# Patient Record
Sex: Male | Born: 1945 | Race: Black or African American | Hispanic: No | Smoking: Never smoker
Health system: Southern US, Community
[De-identification: ages and names within clinical notes are randomized; demographics above are authoritative.]

## PROBLEM LIST (undated history)

## (undated) DIAGNOSIS — I4891 Unspecified atrial fibrillation: Secondary | ICD-10-CM

## (undated) DIAGNOSIS — M109 Gout, unspecified: Secondary | ICD-10-CM

## (undated) DIAGNOSIS — C61 Malignant neoplasm of prostate: Secondary | ICD-10-CM

## (undated) DIAGNOSIS — Z9289 Personal history of other medical treatment: Secondary | ICD-10-CM

## (undated) DIAGNOSIS — I422 Other hypertrophic cardiomyopathy: Secondary | ICD-10-CM

## (undated) DIAGNOSIS — B1920 Unspecified viral hepatitis C without hepatic coma: Secondary | ICD-10-CM

## (undated) DIAGNOSIS — Z860100 Personal history of colon polyps, unspecified: Secondary | ICD-10-CM

## (undated) DIAGNOSIS — I10 Essential (primary) hypertension: Secondary | ICD-10-CM

## (undated) DIAGNOSIS — I4892 Unspecified atrial flutter: Secondary | ICD-10-CM

## (undated) DIAGNOSIS — K5790 Diverticulosis of intestine, part unspecified, without perforation or abscess without bleeding: Secondary | ICD-10-CM

## (undated) DIAGNOSIS — C189 Malignant neoplasm of colon, unspecified: Secondary | ICD-10-CM

## (undated) DIAGNOSIS — IMO0002 Reserved for concepts with insufficient information to code with codable children: Secondary | ICD-10-CM

## (undated) DIAGNOSIS — Z86718 Personal history of other venous thrombosis and embolism: Secondary | ICD-10-CM

## (undated) HISTORY — DX: Personal history of other venous thrombosis and embolism: Z86.718

## (undated) HISTORY — DX: Unspecified atrial flutter: I48.92

## (undated) HISTORY — DX: Personal history of colonic polyps: Z86.010

## (undated) HISTORY — DX: Reserved for concepts with insufficient information to code with codable children: IMO0002

## (undated) HISTORY — PX: PROSTATECTOMY: SHX69

## (undated) HISTORY — DX: Personal history of colon polyps, unspecified: Z86.0100

## (undated) HISTORY — DX: Diverticulosis of intestine, part unspecified, without perforation or abscess without bleeding: K57.90

## (undated) HISTORY — DX: Malignant neoplasm of colon, unspecified: C18.9

## (undated) HISTORY — DX: Personal history of other medical treatment: Z92.89

## (undated) HISTORY — DX: Gout, unspecified: M10.9

## (undated) HISTORY — DX: Unspecified atrial fibrillation: I48.91

## (undated) HISTORY — DX: Other hypertrophic cardiomyopathy: I42.2

## (undated) HISTORY — DX: Unspecified viral hepatitis C without hepatic coma: B19.20

## (undated) HISTORY — PX: COLONOSCOPY: SHX174

---

## 2001-04-21 ENCOUNTER — Ambulatory Visit (HOSPITAL_COMMUNITY): Admission: RE | Admit: 2001-04-21 | Discharge: 2001-04-21 | Payer: Self-pay | Admitting: *Deleted

## 2001-04-21 ENCOUNTER — Encounter (INDEPENDENT_AMBULATORY_CARE_PROVIDER_SITE_OTHER): Payer: Self-pay | Admitting: *Deleted

## 2002-04-26 DIAGNOSIS — Z9289 Personal history of other medical treatment: Secondary | ICD-10-CM

## 2002-04-26 HISTORY — DX: Personal history of other medical treatment: Z92.89

## 2002-04-30 ENCOUNTER — Ambulatory Visit (HOSPITAL_COMMUNITY): Admission: RE | Admit: 2002-04-30 | Discharge: 2002-04-30 | Payer: Self-pay | Admitting: Cardiology

## 2002-04-30 ENCOUNTER — Encounter: Payer: Self-pay | Admitting: Cardiology

## 2003-03-13 ENCOUNTER — Ambulatory Visit: Admission: RE | Admit: 2003-03-13 | Discharge: 2003-04-12 | Payer: Self-pay | Admitting: Radiation Oncology

## 2003-03-27 ENCOUNTER — Encounter: Payer: Self-pay | Admitting: Urology

## 2003-03-28 HISTORY — PX: PROSTATECTOMY: SHX69

## 2003-04-01 ENCOUNTER — Encounter (INDEPENDENT_AMBULATORY_CARE_PROVIDER_SITE_OTHER): Payer: Self-pay | Admitting: Specialist

## 2003-04-01 ENCOUNTER — Inpatient Hospital Stay (HOSPITAL_COMMUNITY): Admission: RE | Admit: 2003-04-01 | Discharge: 2003-04-04 | Payer: Self-pay | Admitting: Urology

## 2003-04-08 ENCOUNTER — Ambulatory Visit (HOSPITAL_COMMUNITY): Admission: RE | Admit: 2003-04-08 | Discharge: 2003-04-08 | Payer: Self-pay | Admitting: Urology

## 2003-04-08 ENCOUNTER — Emergency Department (HOSPITAL_COMMUNITY): Admission: EM | Admit: 2003-04-08 | Discharge: 2003-04-08 | Payer: Self-pay

## 2003-04-09 ENCOUNTER — Inpatient Hospital Stay (HOSPITAL_COMMUNITY): Admission: EM | Admit: 2003-04-09 | Discharge: 2003-04-17 | Payer: Self-pay | Admitting: Internal Medicine

## 2003-04-09 ENCOUNTER — Encounter: Payer: Self-pay | Admitting: Internal Medicine

## 2003-04-09 ENCOUNTER — Encounter (INDEPENDENT_AMBULATORY_CARE_PROVIDER_SITE_OTHER): Payer: Self-pay | Admitting: Cardiology

## 2003-04-10 ENCOUNTER — Encounter: Payer: Self-pay | Admitting: Internal Medicine

## 2005-12-27 HISTORY — PX: CARDIAC CATHETERIZATION: SHX172

## 2006-05-27 HISTORY — PX: TRANSTHORACIC ECHOCARDIOGRAM: SHX275

## 2006-05-30 ENCOUNTER — Ambulatory Visit: Payer: Self-pay | Admitting: Family Medicine

## 2006-06-02 ENCOUNTER — Ambulatory Visit: Payer: Self-pay | Admitting: Family Medicine

## 2006-06-11 ENCOUNTER — Encounter: Payer: Self-pay | Admitting: Emergency Medicine

## 2006-06-11 ENCOUNTER — Inpatient Hospital Stay (HOSPITAL_COMMUNITY): Admission: AD | Admit: 2006-06-11 | Discharge: 2006-06-17 | Payer: Self-pay | Admitting: Cardiovascular Disease

## 2006-06-13 ENCOUNTER — Encounter (INDEPENDENT_AMBULATORY_CARE_PROVIDER_SITE_OTHER): Payer: Self-pay | Admitting: Cardiology

## 2006-06-30 ENCOUNTER — Ambulatory Visit: Payer: Self-pay | Admitting: Family Medicine

## 2006-07-18 ENCOUNTER — Ambulatory Visit: Payer: Self-pay | Admitting: Gastroenterology

## 2006-08-09 ENCOUNTER — Ambulatory Visit: Payer: Self-pay | Admitting: Gastroenterology

## 2006-08-09 ENCOUNTER — Encounter (INDEPENDENT_AMBULATORY_CARE_PROVIDER_SITE_OTHER): Payer: Self-pay | Admitting: Specialist

## 2006-11-29 ENCOUNTER — Ambulatory Visit: Payer: Self-pay | Admitting: Gastroenterology

## 2007-10-29 ENCOUNTER — Emergency Department (HOSPITAL_COMMUNITY): Admission: EM | Admit: 2007-10-29 | Discharge: 2007-10-29 | Payer: Self-pay | Admitting: Emergency Medicine

## 2011-01-06 ENCOUNTER — Ambulatory Visit: Admit: 2011-01-06 | Payer: Self-pay | Admitting: Family Medicine

## 2011-05-14 NOTE — Op Note (Signed)
NAME:  Jordan [name], Jordan [name] NO.:  [id number]   MEDICAL RECORD NO.:  [id number]                   PATIENT TYPE:  INP   LOCATION:  0364                                 FACILITY:  [facility]   PHYSICIAN:  [name], M.D.               DATE OF BIRTH:  11/29/1946   DATE OF PROCEDURE:  04/01/2003  DATE OF DISCHARGE:                                 OPERATIVE REPORT   PREOPERATIVE DIAGNOSIS:  Clinical stage T1C adenocarcinoma of the prostate.   POSTOPERATIVE DIAGNOSIS:  Clinical stage T1C adenocarcinoma of the prostate.   PROCEDURE PERFORMED:  1. Pelvic lymph node dissection.  2. Radical retropubic prostatectomy.   SURGEON:  [name], M.D.   ASSISTANTS:  1. Lindaann Slough, M.D.  2. Melvyn Novas, M.D.   ANESTHESIA:  General endotracheal.   INDICATIONS:  Mr. Jordan [name] is a 65 year old male.  He was recently noted to  an elevated PSA of approximately 6.8.  A year prior to that, his PSA had  been 4.8, but he was not sent for urologic evaluation at that time.  He was  asymptomatic with a normal digital rectal exam.  Biopsies revealed bilateral  adenocarcinoma of the prostate with a Gleason score 3 + 3 = 6 on both sides.  Approximately 40% of the biopsy material was positive on the right side and  5% on the left.  The patient underwent extensive counseling by myself as  well as Maryln Gottron, M.D. of radiation oncology with regard to his  prescription options.  He elected to be treated with radical retropubic  prostatectomy.  The patient appeared to understand the advantages of surgery  as well as the potential complications and risks.  Complications included  but were not limited to operative death; or perioperative complications  including bleeding, infection, pulmonary embolus, etc.  We also spent  extensive time discussing the issues with regard to incontinence and  impotence after radical retropubic prostatectomy.  The patient appeared to  understand the advantages and disadvantages to the approach, and full  informed consent was obtained.   TECHNIQUE AND FINDINGS:  The patient was brought to the operating room where  he had successful induction of endotracheal anesthesia.  He was placed in  the supine position and prepped and draped in the usual manner.  A Foley  catheter was placed sterilely.  A standard lower midline incision was  performed.  An Omni retractor was utilized for retraction.  Standard  bilateral pelvic lymph node dissections were performed.  There was no  evidence of gross adenopathy.  A standard obturator packets were sent  separately.  Obturator nerves were identified bilaterally.  The endopelvic  fascia was then incised bilaterally.  Both apices of the prostate were  palpated, and no obvious induration was noted.  There did not appear to be  any palpable induration of the prostate.  A back-bleeder stitch was placed  across  the dorsal vein superficially at the mid prostatic level.  Puboprostatic ligaments were identified and sharply take down.  One could  easily palpate a groove between the dorsal vein complex and underlying  urethra.  A right-angle clamp was passed beyond that, and two sutures were  placed with #1 Vicryl to tie off the dorsal vein complex.  The dorsal vein  was then transected.  The underlying urethra was identified.  A right-angle  clamp was gently placed underneath the urethra which was then transected.  The Foley catheter was pulled out the incision and removed from the penis.  The posterior aspect of the urethra was then transected.  Sharp dissection  technique and a right-angle were utilized to transect the rectourethralis  musculature, and the posterior plane was then easily established.  Neurovascular bundles were identified, and we felt that this was an  appropriate case for bilateral nerve-sparing prostatectomy.  Endopelvic  fascia was sharply dissected, freeing up the bundles in  the posterolateral  location.  Posteriorly, one could identify the seminal vesicles underneath  Denonvilliers fascia, and the lateral pedicles were taken and tied with silk  suture or clipped with right-angle Hem-o-lok clips.  Once the pedicles were  completely taken down, attention was turned to the bladder neck.  Utilizing  a combination of sharp and blunt dissective technique, a bladder-neck-  sparing procedure was performed, preserving all of the circular fibers of  the bladder neck.  Posteriorly, the planes were a little bit indurated.  We  were able to identify the vas bilaterally which were clipped and separated  from the seminal vesicals.  The seminal vesicals were then taken with clips  at the tips.  A small amount of addition pedicle tissue was then clipped,  and the prostatic specimen was removed.  The bladder neck did not require  any reconstruction.  The mucosa was everted with 4-0 Vicryl suture.  Utilizing the urethral sound, the stump was identified, and five interrupted  sutures of 2-0 Vicryl were placed in corresponding positions with two  posterior, two lateral, and one at the 12 o'clock position.  The sutures  were then placed at the corresponding positions in the bladder neck.  The  anastomosis was then tied over a 22 French catheter.  The bladder was  irrigated, and there appeared to be an excellent anastomosis with no obvious  leak.  The whole pelvis was copiously irrigated.  A Jackson-Pratt drain was  placed in the retropubic space.  Marcaine was infiltrated in the wound.  The  fascia was closed with #1 PDS.  The skin was closed with clips.  Sponge and  needle counts were all correct.  Estimated blood loss was approximately 100  mL.  The patient remained stable throughout the procedure, and no obvious  complications occurred.  He was brought to the recovery room in stable  condition.                                               [name], M.D.    DSG/MEDQ   D:  04/01/2003  T:  04/01/2003  Job:  [id number]

## 2011-05-14 NOTE — H&P (Signed)
NAME:  Jordan [name], Jordan [name] NO.:  [id number]   MEDICAL RECORD NO.:  [id number]                   PATIENT TYPE:  INP   LOCATION:  NA                                   FACILITY:  [facility]   PHYSICIAN:  [name], M.D.               DATE OF BIRTH:  Mar 26, 1946   DATE OF ADMISSION:  04/01/2003  DATE OF DISCHARGE:                                HISTORY & PHYSICAL   CHIEF COMPLAINT:  Clinical stage T1c adenocarcinoma of the prostate.   HISTORY OF PRESENT ILLNESS:  The patient is a very healthy 65 year old male.  He was recently noted to have an elevated PSA of 6.8.  Digital rectal exam  was unremarkable and showed a 1+ prostate without any abnormalities.  An  ultrasound showed a 30 g prostate.  Biopsies were done which showed 40% of  the material positive for a Gleason's 3 + 3 = 6 adenocarcinoma of the  prostate.  On the left he had 5% of the biopsy material positive for a  Gleason 6 tumor.  The patient underwent extensive office consultation and  also consultation with Dr. Ballard Russell.  We discussed at length the  advantages and disadvantages of a surgical approach versus radiation.  The  patient has elected to have a pelvic lymph node dissection radical  retropubic prostatectomy.  He is completely asymptomatic clinically and has  no significant voiding symptoms.   PAST MEDICAL HISTORY:  His past medical history is totally unremarkable.  The patient enjoys good health.  He has been training for a marathon.  He  takes no regular medications and has no allergies.  He is a nonsmoker.   PHYSICAL EXAMINATION:  GENERAL:  He is a well-developed, well-nourished  male.  VITAL SIGNS:  He is afebrile with a blood pressure of 132/80 and a pulse of  64.  His current weight is approximately 228 pounds.  NECK:  His neck shows no JVD or masses.  CHEST:  Clear to auscultation.  HEART:  Regular rate and rhythm.  ABDOMEN:  Soft and nontender.  EXTERNAL GENITALIA:  Normal  penis, scrotum, testes, adnexal structures with  a 1+ prostate.   DATA:  CBC and basic electrolytes are within normal limits.  Preop tests are  otherwise unremarkable.   ASSESSMENT:  Clinical stage T1c adenocarcinoma of the prostate.   PLAN:  The patient is to undergo pelvic lymph node dissection radical  retropubic prostatectomy this morning and will hopefully be admitted for  routine postoperative care status post that procedure.                                               [name], M.D.    DSG/MEDQ  D:  04/01/2003  T:  04/01/2003  Job:  [id number]

## 2011-05-14 NOTE — Discharge Summary (Signed)
   NAME:  Jordan [name], SOMERA NO.:  [id number]   MEDICAL RECORD NO.:  [id number]                   PATIENT TYPE:  INP   LOCATION:  0364                                 FACILITY:  [facility]   PHYSICIAN:  [name], M.D.               DATE OF BIRTH:  [birth year]   DATE OF ADMISSION:  04/01/2003  DATE OF DISCHARGE:  04/04/2003                                 DISCHARGE SUMMARY   DISCHARGE DIAGNOSIS:  Clinical stage T1C and final pathologic stage PT2  adenocarcinoma of the prostate.   PROCEDURE PERFORMED:  On 04/01/03 pelvic lymph node dissection and radial  retropubic prostatectomy.   HOSPITAL COURSE:  Mr. [name] is a 65 year old healthy male.  He was noted  to have a PSA of approximately 6.8.  He had a biopsy which revealed  bilateral adenocarcinoma of the prostate with a Gleason's score of 3 + 3 = 6  bilaterally.  The patient underwent extensive counseling with regard to  treatment options, and elected to have pelvic lymph node dissection of  adequate retropubic prostatectomy.  He was admitted for postoperative care  after that procedure.  His surgery was essentially unremarkable and  uncomplicated.  The patient's estimated blood loss was approximately 800 cc.  His postoperative course was really uneventful.  His postoperative  hemoglobin was 13.1.  Renal function was normal.  He was up and ambulating  on postoperative day #1.  Pain control was always excellent.  He began to  resume a diet which was well tolerated.  His Jackson-Pratt drainage was  relatively minimal.  He remained completely stable.  By postoperative day #3  he was without complaints and afebrile with normal vital signs.  He had had  a bowel movement and was tolerating a general diet well.  His pathology at  the time of discharge was still pending.  His Jackson-Pratt drain was  removed.  Pathology did eventually reveal a pathologic stage T2 tumor.  There was a Gleason's score of 3 + 4 = 7.   The margins were negative.   DISPOSITION:  The patient was discharged home with a Foley catheter to  drainage.  He was given pain medication.  He will be seen in approximately  one week in our office.                                               [name], M.D.    DSG/MEDQ  D:  04/24/2003  T:  04/24/2003  Job:  [id number]

## 2011-05-14 NOTE — Cardiovascular Report (Signed)
NAME:  SENG, LARCH NO.:  [id number]   MEDICAL RECORD NO.:  [id number]            PATIENT TYPE:   LOCATION:                                 FACILITY:   PHYSICIAN:  Nanetta Batty, M.D.        DATE OF BIRTH:   DATE OF PROCEDURE:  06/13/2006  DATE OF DISCHARGE:                              CARDIAC CATHETERIZATION   HISTORY:  Jordan [name] is a 65 year old African-American male admitted by  Dr. Tresa Endo on 06/16 because of lightheadedness and weakness with presyncope.  He has a history of hypertension, DVT, and afebrile, on Coumadin  anticoagulation.  Rule out for myocardial infarction.  He had no significant  arrhythmias noted on telemetry.  He did have T wave inversion on his EKG;  and presents now for diagnostic coronary angiography to rule out ischemic  etiology.   DESCRIPTION OF PROCEDURE:  The patient was brought to the second floor [facility] in the postabsorptive state.  He was premedicated with  p.o. Valium and IV Versed.  His right groin was prepped and shave in the  usual sterile fashion.  Xylocaine 1# was used for local anesthesia. A 6-  French sheath was inserted into the right femoral artery using standard  Seldinger technique.  A 6-French, right and left Judkins diagnostic catheter  as well as a 6-French pigtail catheter were used for selective coronary  angiography, and left ventriculography respectively.  Isovue dye was used  for the entirety of the case.  Aortic, left ventricular, and pullback  pressures were recorded   HEMODYNAMIC RESULTS:  1.  Aortic systolic pressure 123, diastolic pressure 76.  2.  Left ventricular systolic pressure 124, end-diastolic pressure was 13.   SELECTIVE CHOLANGIOGRAPHY:  1.  Left main normal.  2.  LAD normal.  3.  Left circumflex normal.  4.  Right coronary is dominant and normal.   IMPRESSION:  Jordan [name] has normal coronary arteries, normal LV function.  I believe his T wave inversion is  related to LVH with strain probably from  poorly controlled hypertension.  The plan will be to re-heparinize him and  ultimately re-Coumadinize him.  He will be discharged home once his INR is  greater than or equal to 2.0.  The sheath was removed and pressure was held  on the groin to achieve hemostasis.  The patient left the lab in stable  condition.      Nanetta Batty, M.D.  Electronically Signed     JB/MEDQ  D:  06/13/2006  T:  06/14/2006  Job:  [id number]   cc:   [name], M.D.  Fax: [phone]   [name], M.D.  Fax: [phone]

## 2011-05-14 NOTE — Discharge Summary (Signed)
NAME:  Jordan [name], Jordan [name] NO.:  [id number]   MEDICAL RECORD NO.:  [id number]                   PATIENT TYPE:  INP   LOCATION:  0374                                 FACILITY:  [facility]   PHYSICIAN:  Rosanne Sack, M.D.         DATE OF BIRTH:  1946-05-23   DATE OF ADMISSION:  04/09/2003  DATE OF DISCHARGE:  04/17/2003                                 DISCHARGE SUMMARY   DISCHARGE DIAGNOSES:  1. 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     a. Discharge INR 2.7.  2. Paroxysmal atrial fibrillation with transient rapid ventricular response     secondary to problem number one.     a. In normal sinus rhythm.  3. Recent total radical prostatectomy by Jordan [name], M.D. with pelvic     lymph node dissection on April 01, 2003.     a. P1c adenocarcinoma.     b. Urinary incontinence after voiding trials.  4. Hypertension.  5. Normocytic anemia.     a. Hemoglobin 12.8.  6. Questionable sleep apnea.     a. Sleep study to be arranged by Jordan [name], M.D. upon discharge.   DISCHARGE MEDICATIONS:  1. Coumadin 7.5 mg p.o. daily.  2. Levaquin 250 mg p.o. daily x3 days.  3. Cardizem CD 180 mg p.o. daily.   DISCHARGE FOLLOWUP:  1. The patient will follow up with his primary care Jordan [name], Jordan [name],     M.D., Friday for a pro time check.  Jordan [name], M.D. will be in charge     of managing the patient's anticoagulation therapy.  Notice that the     Coumadin dose is 7.5 mg daily currently.  2. The patient is on Levaquin by Jordan [name], M.D. after the Foley     catheter was removed.  We would suspect a need for readjustment on the     Coumadin dose within three to five days when the Levaquin will be     completely finished.  The goal INR is between 2-3.  The length of therapy     should be about 12 months.  3. [name], M.D. will follow the patient within four weeks or so     from the cardiac standpoint.  The  patient had a history of P-wave     inversions in the lateral leads.  The patient had a Cardiolite in May     2003 with negative results.  We suspect that the paroxysmal atrial     fibrillation noticed on admission was likely to be associated with     bilateral pulmonary emboli.  The patient converted to sinus rhythm on day     one and remained in normal sinus rhythm for the rest of this     hospitalization.  It is for this reason that perhaps long-term     anticoagulation therapy may not be needed.  [name], M.D. will  decide upon discharge if the Cardizem will be continued.  Perhaps this     agent may need to continue given the patient's hypertension.  After 12     months of anticoagulation therapy for the patient's bilateral PE, aspirin     may be indicated given the transient cardiac arrhythmia.  4. Jordan [name], M.D. will schedule a sleep study upon discharge to rule out     sleep apnea.  5. Jordan [name], M.D. will follow the patient within two weeks from the     urological standpoint.   CONSULTATIONS:  1. Jordan [name], M.D., urology.  2. [name], M.D., cardiology.   PROCEDURES:  1. Chest CT scan consistent with bilateral small lower lobe pulmonary     emboli.  2. 2-D echocardiogram, results pending.   DISCHARGE LABORATORY DATA:  INR 2.7.  Hemoglobin 12.8, MCV 84, WBC 7.9,  platelets 353,000.  Negative cardiac enzymes.  TSH 1.551.   HOSPITAL COURSE:  The patient is a pleasant 65 year old gentleman admitted  to [facility] on April 09, 2003 with progressive left leg pain.  Please see admission H&P by Rosanne Sack, M.D. for further details  regarding the history of presentation, physical examination, and laboratory  data.   Problem 1 - BILATERAL PULMONARY EMBOLI ASSOCIATED WITH A LEFT DEEP VENOUS  THROMBOSIS:  The patient went under radical retropubic prostatectomy on  April 01, 2003 due to P1 C adenocarcinoma of the prostate.  A few  days later  the patient developed some left leg pain.  The day prior admission the  patient was diagnosed with a noncomplicated DVT.  Jordan [name], M.D. made  arrangements for Lovenox at home.  The next day the patient complained of  worsening leg pain for which he was admitted to the hospital.  On arrival to  [facility] the patient denied any chest pain or shortness of  breath.  The pulse oxygen was 98% on room air.  He was noticed to have  atrial fibrillation with heart rate in the 130s in telemetry.  The plain  film of the chest showed the possible infiltrate in the right lower lobe.  For this reason a CAT scan of the chest was obtained to rule out PE.  His  imaging study proved bilateral small pulmonary emboli affecting both lower  lobes.  The patient was hemodynamically stable and hypoxia was not present.  The patient was started on IV heparin.  Coumadin was started on day two  without complications.  Once the INR became therapeutic we overlapped a  therapeutic INR with Lovenox for a total of two days.  The IV heparin was  switched to full dose Lovenox on day three of hospital stay.  There were no  complications with the anticoagulation therapy.  The patient remained  hemodynamically stable and afebrile throughout this hospitalization.  Due to  the voiding trials and the discontinuation of the Foley catheter, the  patient was placed on Levaquin by Jordan [name], M.D., the patient's  urologist.  For this reason the Coumadin was slightly readjusted.  We  recommend close follow-up on the patient's INR as we suspect that after the  patient finishes with his Levaquin his Coumadin will have to be increased in  the near future.  Jordan [name], M.D. will be in charge of this management.  The length of treatment should be 12 months.  A goal INR is between 2-3.  We  believe that the left  DVT was associated with a recent surgical process.  No  hypercoagulability studies were  obtained.   Problem 2 - NEW ONSET OF PAROXYSMAL ATRIAL FIBRILLATION WITH RAPID  VENTRICULAR RESPONSE:  As described above, the patient presented in atrial  fibrillation.  The cardiac enzymes and a TSH were normal.  2-D  echocardiogram was obtained but the results are not available.  Thereasa Solo.  Little, M.D. was consulted and agreed with the current management.  The  patient was started on Cardizem drip.  On day two the patient was in normal  sinus rhythm.  The Cardizem drip was switched to tablets without  complications.  The patient remained in normal sinus rhythm for the rest of  his hospital stay.  We suspect that the pulmonary emboli are likely to be  the cause of this atrial fibrillation.  For this reason Gaspar Garbe B. Little,  M.D. will decide upon discharge if the anticoagulation therapy should be  only used for pulmonary emboli therapy.  The length of treatment is  described in problem number one.  Once the 12 months of Coumadin are  finished perhaps aspirin should be used for a stroke prophylaxis.  Cardizem  was used as described above.  Given the patient's concurrent hypertension,  this agent may be continued long-term.  The patient had a Cardiolite in May  2003 with noted ischemia.  The estimated EF was 47%.  The ejection fraction  by the 2-D echocardiogram obtained during this hospital stay is not  available at this point.  [name], M.D. will share this information  and Jordan [name], M.D. and patient upon discharge   Problem 3 - RECENT RADICAL PROSTATECTOMY:  The patient had an indwelling  catheter after this procedure that was done on April 01, 2003.  During this  hospitalization the Foley catheter was discontinued two days prior to  discharge.  Voiding trials were ongoing with no evidence of urinary  obstruction.  Levaquin was used as described above.  Jordan [name], M.D.  will follow within two weeks upon discharge.   Problem 4 -  HYPERTENSION:  The degree of  uncontrolled hypertension was  various on admission.  With Cardizem drip the patient was hemodynamically  stable and his blood pressure was within normal limits.  See above for plan.   Problem 5 - QUESTIONABLE SLEEP APNEA:  Clinically, the patient's wife  describes possible signs of sleep apnea.  No hypoxia was noticed at night.  Sleep study may be arranged by Jordan [name], M.D. upon discharge.   I spent about 45 minutes in the discharge process of this patient.   CONDITION ON DISCHARGE:  Improved.                                               Rosanne Sack, M.D.    JM/MEDQ  D:  04/17/2003  T:  04/17/2003  Job:  [id number]   cc:   Jordan [name], M.D.  [address]  [place], [place] [address]  Fax: [phone]   [name], M.D.  [address]  [place] [address]  Fax: [phone]   Jordan [name], M.D.  [address]  [place]  [place] [address]  Fax: [phone]

## 2011-05-14 NOTE — Assessment & Plan Note (Signed)
[facility]                           GASTROENTEROLOGY OFFICE NOTE   TAHER, VANNOTE                    MRN:          [id number]  DATE:07/18/2006                            DOB:          Nov 18, 1946    NEW GI CONSULTATION:   PRIMARY CARE PHYSICIAN:  [name], MD   REASON FOR REFERRAL:  Dr. Susann Givens asked me to evaluate Jordan [name] in  consultation regarding hepatitis C, screening colonoscopy.   HISTORY OF PRESENT ILLNESS:  Jordan [name] is a very pleasant 65 year old man  who is here to discuss two issues.  First, he was recently diagnosed with  hepatitis C.  This was discovered when he had routine labs checked and he  had mildly elevated transaminases.  Other liver tests were completely  normal.  A workup showed hepatitis C antibody positive.  This was further  worked up and he had genotype 1.  He has never been jaundiced, never had  hematemesis or hematochezia, ascites or edema or encephalopathic episodes to  suggest underlying cirrhosis.   He is also here to discuss screening colonoscopy.  He has never had troubles  with his bowels and did have a colonoscopy 6-7 years ago by Dr. Mila Palmer.  He  was told at that time that he did have a polyp and would need follow-up in  the future.   REVIEW OF SYSTEMS:  Essentially negative and is available on his nursing  intake sheet.   PAST MEDICAL HISTORY:  1.  Hypertension.  2.  DVT in 2003, treated with Coumadin.  He was then off Coumadin for 6-8      months and was found to be in atrial fibrillation, so he was put back on      Coumadin and remains on Coumadin now for cardiac arrhythmia reasons.  3.  Prostate cancer, treated by a radical prostatectomy.  4.  Chronic hepatitis C as above.   CURRENT MEDICATIONS:  Cardizem, Coumadin.   ALLERGIES:  No known drug allergies.   SOCIAL HISTORY:  Single.  One son.  One recently-deceased daughter (died of  lymphoma last month).  Works as an Neurosurgeon.  Nonsmoker,  nondrinker.  Never used IV drugs.  No memory of blood transfusions.  Did do  nasal cocaine in the distant past.  No tattoos.   FAMILY HISTORY:  Uncle with alcoholism.  Uncle with prostate cancer.  Daughter recently died of her lymphoma.   PHYSICAL EXAMINATION:  VITAL SIGNS:  6 feet 0 inches, 230 pounds.  Blood  pressure 130/88, pulse 64.  CONSTITUTIONAL:  Generally well-appearing.  NEUROLOGIC:  Alert and oriented x3.  HEENT:  Eyes:  Extraocular movements intact.  Mouth:  Oropharynx moist, no  lesions.  NECK:  Supple, no lymphadenopathy.  CARDIOVASCULAR:  Heart regular rate and rhythm.  LUNGS:  Clear to auscultation bilaterally.  ABDOMEN:  Soft, nontender, nondistended.  Normal bowel sounds.  EXTREMITIES:  No lower extremity edema.  SKIN:  No rash or lesion of the distal extremities.   ASSESSMENT AND PLAN:  A 65 year old man with chronic hepatitis C, history of  colon polyps.  1.  We will refer him to the viral hepatitis clinic here in [place], as      we do not have a hepatitis C program here in our office.  I briefly went      over treatment options and strategies.  On history he has no signs of      significant liver disease.  I will arrange for him to have a right upper      quadrant ultrasound and forward that information to the hepatitis clinic      as well.  I explained to him that he may need a liver biopsy prior to      deciding whether or not he should be treated.  He does understand that      if he does require treatment, it is generally a shot once a week and      several pills every day and on a perfect regimen, his odds of complete      clear are approximately 50-50.  2.  For his history of colon polyps we will arrange for him to have a      colonoscopy as soon as convenient.  He wants to wait until mid-August      due to his work schedule, and I think that is perfectly reasonable.  He      will come off his Coumadin for 5 days and aspirin  for 7 days.                                   Rachael Fee, MD   DPJ/MedQ  DD:  07/18/2006  DT:  07/18/2006  Job #:  [id number]   cc:   [name], MD

## 2011-05-14 NOTE — Procedures (Signed)
[facility]                            ABDOMINAL ULTRASOUND REPORT   KADAN, Jordan [name]                    MRN:          [id number]  DATE:08/09/2006                            DOB:          Nov 20, 1946   ACCESSION NUMBER:  [id number]   READING PHYSICIAN:  Ulyess Mort, MD   INDICATIONS:  Jordan [name] has elevated liver function enzymes.  He was seen in  consultation with Dr. Christella Hartigan.   FINDINGS:  Aorta within normal limits, measuring 1.9 cm.  Inferior vena cava  was fairly well visualized and appeared within normal limits and was patent.   Pancreas was only partially visualized.  The body does appear normal.  The  head and tail were not adequately visualized.   The gallbladder was well visualized and appeared within normal limits.  The  wall thickness was only 2.7 mm.  The common bile duct was within normal  limits, measuring 5.4 mm.   The liver revealed no increased echodensity.  No mass or dilated ducts.  It  was normal size.   The right kidney and left kidney were within normal limits, as was the  spleen.   IMPRESSION:  Essentially normal abdominal ultrasound excluding the head and  tail of the pancreas, which were not adequately visualized.                                   Ulyess Mort, MD   SML/MedQ  DD:  08/09/2006  DT:  08/10/2006  Job #:  [id number]   cc:   Rachael Fee, MD

## 2011-05-14 NOTE — H&P (Signed)
NAME:  Jordan [name], Jordan [name] NO.:  [id number]   MEDICAL RECORD NO.:  [id number]                   PATIENT TYPE:  INP   LOCATION:  0374                                 FACILITY:  [facility]   PHYSICIAN:  Rosanne Sack, M.D.         DATE OF BIRTH:  [birth year]   DATE OF ADMISSION:  DATE OF DISCHARGE:                                HISTORY & PHYSICAL   CHIEF COMPLAINT:  Left leg pain and swelling.   PROBLEM LIST:  1. New onset paroxysmal atrial fibrillation with rapid ventricular response.     A. Cardiolite study done Apr 30, 2002, by Dr. Clarene Duke with no evidence of        ischemia, no wall motion abnormality, left ventricular ejection        fraction 47%. Indications for cardiology workup were chest pain and        abnormal electrocardiogram.  2. New onset left leg pain and swelling.     A. Preliminary telephone report venous Doppler study on April 08, 2003,        noted an extensive left leg catheterization area deep venous        thrombosis.  3. 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     A. Status post radical retropubic prostatectomy with pelvic lymph node        dissection, Dr. Isabel Caprice on April 01, 2003.   ALLERGIES:  No known drug allergies.   HISTORY OF PRESENT ILLNESS:  This 65 year old male is followed as an  outpatient by Dr. Isabel Caprice of urology. He had a radical retropubic  prostatectomy with pelvic lymph node dissection performed by Dr. Isabel Caprice on  April 01, 2003. Over the past 48 hours the patient has developed left leg  pain and edema.   He came to Dr. Ellin Goodie office on April 08, 2003, for his postoperative  evaluation and relayed these complaints. A lower extremity Doppler study was  done showing an extensive left calf area DVT. He was given an injection of  Lovenox at the office and returned home. Overnight and early this morning he  found the pain and edema worsening to where he could not bear weight on the  left leg. He came to  [facility] per instructions of Dr. Ellin Goodie  office for a direct admission. He was found by assessment and 12-lead EKG to  have a rapid heart rate. The 12-lead EKG indicated a rate of 118 with atrial  fibrillation and rapid ventricular response.   FAMILY HISTORY:  Noncontributory.   SOCIAL HISTORY:  The patient lives at home independently. No alcohol or  tobacco use. He is a nonsmoker and runs routinely training for a marathon.   REVIEW OF SYSTEMS:  HEENT:  No visual or hearing changes. HEART:  No current  complaints of chest pain. Denies any sensation of palpation. LUNGS:  Denies  shortness of breath or pain. Denies cough or hemoptysis.  ABDOMEN:  Denies  nausea or vomiting. Denies any change in his stooling  pattern. Denies any frank rectal bleeding or tarry stools. GU:  Recent  prostatectomy by Dr. Isabel Caprice on  April 01, 2003. Denies any symptomology  consistent with a urinary tract infection. MUSCULOSKELETAL/VASCULAR:  Complaints over the past 48 hours of increasing  pain and swelling, left  lower extremity as indicated above. Workup as indicated above. NEUROLOGIC:  Denies any focal changes or weakness. INTEGUMENTARY: Denies any ulcers or  suspicious lesions.   MEDICATIONS ON ADMISSION:  The patient denies any routine prescription  medications.   LABORATORY DATA:  Admission labs pending as per admission orders. A 12-lead  EKG was obtained noted atrial fibrillation with rapid ventricular response,  rate currently 118 beats per minute.   PHYSICAL EXAMINATION:  VITAL SIGNS:  On admission afebrile, pulse 120,  respirations 16, blood pressure 137/70, O2 saturations 100% on 2 liters.  GENERAL: Admission weight 128 pounds, height 72 inches. A well developed  middle-aged male in no acute distress. Alert, cooperative, oriented x4.  HEENT:  Head normocephalic. Pupils equal. Ears, canals clear and hearing  grossly normal. Nose, nares patent without masses or discharge noted. Oral   mucosa pink and moist.  NECK:  No jugular venous  distention or bruits noted.  HEART:  Rate and rhythm irregularly irregular, apically 120 per minute. No  murmur is appreciated.  LUNGS:  Breath sounds clear and equal bilaterally.  ABDOMEN:  Soft and round with positive bowel sounds. No pain or masses to  palpation. No organomegaly noted.  GU:  Some suprapubic pain, status post prostatectomy on March 31, 2003. A  Foley catheter has been placed with clear yellow urine. No evidence of  hematuria. No CVA tenderness.  RECTAL:  Deferred.  MUSCULOSKELETAL/EXTREMITIES: Range of motion and strength normal, 5/5.  There is a unilateral left leg edema extending from foot to knee  approximately 2+. There is significant pain with palpation over the left  calf. No pain with palpation above the knee on the left. Pedal pulses 2+ on  the right and not palpated on the left. The toes are warm with capillary  refill 2+ bilaterally  NEUROLOGIC:  Cranial nerves II through XII grossly intact. No unilateral or  focal defects noted.  INTEGUMENTARY:  Skin warm and dry. No ulcers or suspicious lesions noted.   IMPRESSION AND PLAN:  1. New onset paroxysmal atrial fibrillation with rapid ventricular response.     This diagnosis per clinical examination and 12-lead EKG. Admit to     telemetry bed service with Dr. Nolon Rod Monguilod [phone]. Will start     oral Cardizem with intention to normalize  rate and rhythm. Will check a     serum TSH and cardiac enzymes x3. We will check a 2D echocardiogram to be     read by Dr. Clarene Duke of cardiology. Also a cardiology consult with Dr.     Clarene Duke for atrial fibrillation management.  2. Extensive left leg calf area deep venous thrombosis. Will initiate a full     dose of Lovenox with conversion to Coumadin with pharmacy to follow per     protocol. Bed rest for the first 24 to 48 hours. 3. History of chest pain and abnormal EKG in May 2003. I note an essentially     normal  Cardiolite study at that time. Again cardiac enzymes x3 and     cardiac consult with Dr. Clarene Duke. No complaints of chest pain currently.  4. Status post prostatectomy. This for  prostate cancer done April 01, 2003,     by Dr. Isabel Caprice. The patient appears stable from that perspective. Will     check electrolytes and renal function with a comprehensive metabolic     panel. Check for potential anemia with a complete blood count. Also rule     out a urinary tract infection, will check a urinalysis with culture and     sensitivity.   DISPOSITION:  Telemetry bed, service of Dr. Nolon Rod Monguilod, [phone].  Condition on admission guarded. Activity bed rest the first 24 to 48 hours,  diet regular. Code status, full code pending physician review. l      Everett Graff, N.P.                     Rosanne Sack, M.D.    TC/MEDQ  D:  04/09/2003  T:  04/09/2003  Job:  [id number]

## 2011-05-14 NOTE — Discharge Summary (Signed)
NAME:  Jordan [name], ROTAN NO.:  [id number]   MEDICAL RECORD NO.:  [id number]          PATIENT TYPE:  INP   LOCATION:  4708                         FACILITY:  [facility]   PHYSICIAN:  Darcella Gasman. In[place], N.P.  DATE OF BIRTH:  04-01-1946   DATE OF ADMISSION:  06/11/2006  DATE OF DISCHARGE:  06/17/2006                                 DISCHARGE SUMMARY   DISCHARGE DIAGNOSES:  1.  Lightheadedness.      1.  Negative MI.      2.  Negative pulmonary embolus.      3.  Patent coronary arteries.  2.  History of DVT on Coumadin as well as history of pulmonary embolus on      Coumadin.  3.  Atrial fibulation paroxysmal.  4.  History of prostate cancer with radial prostatectomy.  5.  History of positive hepatitis C.  6.  Abnormal EKG.   DISCHARGE CONDITION:  Improved.   PROCEDURES:  June 13, 2006 combined left heart cath by Dr. Nanetta Batty.  Normal cores.  Normal LV function.   DISCHARGE MEDICATIONS:  1.  Aspirin 81 mg daily.  2.  Cardizem CD 360 mg daily.  3.  Coumadin 10 mg daily.  He takes two 5 mg tablets.   DISCHARGE INSTRUCTIONS:  1.  Low fat low salt diet.  2.  Wash right groin cath site with soap and water.  Call if any bleeding,      swelling or drainage.  3.  Follow up with Dr. Clarene Duke July 11, 2006 at 3:45 p.m.  4.  Follow up with Dr. Sherwood Gambler in 2-3 weeks.  5.  Have lab work done on Tuesday morning to check Coumadin.   HISTORY OF PRESENT ILLNESS:  65 year old African-American male without prior  history of coronary disease had been experiencing lightheadedness and  weakness with exertion with regular activity such as walking.  Unfortunately, during the last few months he had similar episodes that  happened last week and the nurse at work sent him to our office where he saw  Dr. Jacinto Halim.  EKG was done and he was scheduled for a Myoview study next  month, but another episode happened when he was walking in the hall at  [facility] on June 11, 2006 to visit  his sister.  He went to the  car to get a heart monitor that showed heart rate fluctuations from 58 to  82.  Denied chest pain, shortness of breath, muscle pain, joint pain,  palpitations or dizziness.  He came to the emergency room and was admitted.   PAST MEDICAL HISTORY:  1.  Hypertension.  2.  DVT.  3.  Bilateral PE.  4.  Prostatectomy for prostate cancer in 2004.  5.  History of atrial fibulation.   FAMILY HISTORY:  See H&P.   SOCIAL HISTORY:  See H&P.   REVIEW OF SYSTEMS:  See H&P.   ALLERGIES:  No known allergies.   OUTPATIENT MEDICATIONS:  1.  Cardizem 240 mg.  2.  Coumadin 10 mg.   PHYSICAL EXAMINATION AT DISCHARGE:  Blood pressure 117/73, pulse 53.  Lungs  are clear.  Heart regular rate and rhythm.  Right groin cath site stable.   LABORATORY DATA:  Hemoglobin 16.1, hematocrit 48, WBC 5.9, platelets 232,  neutrophils 56, lymphs 30, mono 12, eos 2, basos 0.  These remained stable.  Coags on admission:  ProTime 21.2.  INR of 1.8.  PTT 97.  He was on Heparin  and after his cardiac cath placed back on Coumadin.  INR at discharge was  2.5.  Chemistry:  Sodium 138.  Potassium 4.0.  Chloride 108.  CO2 23.  Glucose 114.  BUN 9.  Creatinine 1.  Calcium 8.9.  Total protein 7.  Albumin  3.5.  AST 39.  ALT 56.  ALP 54.  Total bili 1.1.  These remain stable.  He  does have a history of hepatitis C.   CK 103, 81, 72.  MB is 3.7-2.8.  Troponin 0.01-0.02.   Cholesterol 134, triglycerides 32, HDL 48, LDL 80.   CT of the chest on June 15, 2006.  No CT evidence of PE.  Tiny low density  lesion in both kidneys too small to characterize.  Chest x-ray June 11, 2006  borderline cardiomegaly.  Mild peribronchial thickening.   EKG on admission:  Sinus brady.  ST-T wave abnormality.  Constrained lateral  ischemia.  Follow up on June 12, 2006 sinus brady again, ST-T wave  abnormality but no significant change.   2D echo done June 13, 2006:  EF was 60%.  There was mild focal basal  septal  hypertrophy.  Aortic valve thickness was mildly increased.  Left atrium was  moderately to markedly dilated.   Cardiac cath with normal cores and normal EF.   HOSPITAL COURSE:  Mr. Sherk was admitted by Dr. Tresa Endo on call for Dr.  Clarene Duke secondary to lightheadedness with exertion, abnormal EKG, concern for  cardiac disease versus PE.  He was admitted to [facility].  Coumadin was  held.  He went on Heparin.  INR was below 2.  2D echo as well as CT of the  chest was ordered.  Enzymes were all negative.  He was stable without  further episodes.  He underwent cardiac cath with  normal force.  Post cath he was started back on his Coumadin with heparin  crossover and on June 17, 2006 he was ambulating in the halls.  No further  dizziness.  Blood pressure was controlled.  Sinus bradycardia and sinus  rhythm and no complaints.  He will follow up as an outpatient with Dr.  Clarene Duke.      Darcella Gasman. Annie Paras, N.P.     LRI/MEDQ  D:  06/22/2006  T:  06/22/2006  Job:  [id number]   cc:   [name], M.D.  Fax: [phone]   Nicki Guadalajara, M.D.  Fax: [phone]   [name], M.D.  Fax: [phone]

## 2011-08-26 ENCOUNTER — Encounter: Payer: Self-pay | Admitting: Gastroenterology

## 2011-10-04 ENCOUNTER — Emergency Department (HOSPITAL_BASED_OUTPATIENT_CLINIC_OR_DEPARTMENT_OTHER)
Admission: EM | Admit: 2011-10-04 | Discharge: 2011-10-04 | Disposition: A | Discharge status: Home / Self Care | Payer: BC Managed Care – HMO | Attending: Emergency Medicine | Admitting: Emergency Medicine

## 2011-10-04 ENCOUNTER — Encounter: Payer: Self-pay | Admitting: *Deleted

## 2011-10-04 DIAGNOSIS — I10 Essential (primary) hypertension: Secondary | ICD-10-CM | POA: Insufficient documentation

## 2011-10-04 DIAGNOSIS — I4891 Unspecified atrial fibrillation: Secondary | ICD-10-CM | POA: Insufficient documentation

## 2011-10-04 DIAGNOSIS — IMO0002 Reserved for concepts with insufficient information to code with codable children: Secondary | ICD-10-CM | POA: Insufficient documentation

## 2011-10-04 DIAGNOSIS — X58XXXA Exposure to other specified factors, initial encounter: Secondary | ICD-10-CM | POA: Insufficient documentation

## 2011-10-04 DIAGNOSIS — T148XXA Other injury of unspecified body region, initial encounter: Secondary | ICD-10-CM

## 2011-10-04 HISTORY — DX: Unspecified atrial fibrillation: I48.91

## 2011-10-04 HISTORY — DX: Essential (primary) hypertension: I10

## 2011-10-04 HISTORY — DX: Malignant neoplasm of prostate: C61

## 2011-10-04 MED ORDER — OXYCODONE-ACETAMINOPHEN 5-325 MG PO TABS: 2.0000 | ORAL_TABLET | ORAL | Status: AC | PRN

## 2011-10-04 MED ORDER — IBUPROFEN 600 MG PO TABS: 600.0000 mg | ORAL_TABLET | Freq: Four times a day (QID) | ORAL | Status: AC | PRN

## 2011-10-04 MED ORDER — OXYCODONE-ACETAMINOPHEN 5-325 MG PO TABS
1.0000 | ORAL_TABLET | Freq: Once | ORAL | Status: AC
  Administered 2011-10-04: 1 via ORAL
  Filled 2011-10-04: qty 1

## 2011-10-04 NOTE — ED Provider Notes (Signed)
History    Scribed for Lyanne Co, MD, the patient was seen in room MH07/MH07. This chart was scribed by Katha Cabal. This patient's care was started at 8:03 PM.    CSN: [id number] Arrival date & time: 10/04/2011  7:31 PM  Chief Complaint  Patient presents with  . Leg Pain    (Consider location/radiation/quality/duration/timing/severity/associated sxs/prior treatment) HPI  Jordan [name] is a 65 y.o. male who presents to the Emergency Department complaining of gradual onset of right leg pain after swinging leg.  Pain worsens with ROM and walking.  Patient has hx of DVTs after prostate CA surgery.  Patient takes coumadin in 4s.  Patient was told to resume dosage in 2 days.  Denies numbness, weakness and tingling.  Patient has pulled muscle in past and admits similar pain.   Patient took Ibuprofen and used heating pad for pain.    Patient has hx of HTN.       PAST MEDICAL HISTORY:  Past Medical History  Diagnosis Date  . Hypertension   . Atrial fibrillation   . Prostate cancer     PAST SURGICAL HISTORY:  Past Surgical History  Procedure Date  . Prostatectomy     FAMILY HISTORY:  History reviewed. No pertinent family history.   SOCIAL HISTORY: History   Social History  . Marital Status: Single    Spouse Name: N/A    Number of Children: N/A  . Years of Education: N/A   Social History Main Topics  . Smoking status: Never Smoker   . Smokeless tobacco: None  . Alcohol Use: No  . Drug Use: No  . Sexually Active: No   Other Topics Concern  . None   Social History Narrative  . None      Review of Systems  All other systems reviewed and are negative.    Allergies  Review of patient's allergies indicates not on file.  Home Medications   Current Outpatient Rx  Name Route Sig Dispense Refill  . IBUPROFEN 600 MG PO TABS Oral Take 1 tablet (600 mg total) by mouth every 6 (six) hours as needed for pain. 30 tablet 0  . OXYCODONE-ACETAMINOPHEN 5-325 MG  PO TABS Oral Take 2 tablets by mouth every 4 (four) hours as needed for pain. 15 tablet 0    BP 144/94  Pulse 59  Temp(Src) 98.8 F (37.1 C) (Oral)  Resp 16  Ht 6' (1.829 m)  Wt 220 lb (99.791 kg)  BMI 29.84 kg/m2  SpO2 100%  Physical Exam  Nursing note and vitals reviewed. Constitutional: He is oriented to person, place, and time. He appears well-developed and well-nourished. No distress.  HENT:  Head: Normocephalic.  Eyes: EOM are normal.  Neck: Normal range of motion.  Pulmonary/Chest: Effort normal. No respiratory distress.  Musculoskeletal: Normal range of motion.       Nlm PT and DP pulses bilaterally, symmetric lower extremities.  Left lower extremity with no tenderness of his medial thigh.  No erythema no fluctuance.  Left knee with no joint effusion no lymphadenopathy noted in left groin  Neurological: He is alert and oriented to person, place, and time.  Skin: Skin is warm and dry.  Psychiatric: He has a normal mood and affect.    ED Course  Procedures (including critical care time)   OTHER DATA REVIEWED: Nursing notes, vital signs, and past medical records reviewed.   DIAGNOSTIC STUDIES: Oxygen Saturation is 100% on room air, normal  by my interpretation.  LABS / RADIOLOGY:  No results found for this or any previous visit.     ED COURSE / COORDINATION OF CARE: 8:09 PM  Physical exam complete.  Will order crutches to help with mobilization.  Pain control.    Orders Placed This Encounter  Procedures  . Crutches  . Apply ace wrap       MDM   MDM: The patient appears to have evidence of a muscle strain.  No signs of cellulitis.  Doubt deep space infection.  Doubt DVT.  Left leg is neurovascularly intact with normal distal pulses.  Pain treated in ER patient given crutches and an Ace wrap.  No indication for imaging he has had no trauma to this leg   IMPRESSION: Diagnoses that have been ruled out:  Diagnoses that are still under consideration:   Final diagnoses:  Muscle strain     MEDICATIONS GIVEN IN THE E.D. Scheduled Meds:    . oxyCODONE-acetaminophen  1 tablet Oral Once   Continuous Infusions:     DISCHARGE MEDICATIONS: New Prescriptions   IBUPROFEN (ADVIL,MOTRIN) 600 MG TABLET    Take 1 tablet (600 mg total) by mouth every 6 (six) hours as needed for pain.   OXYCODONE-ACETAMINOPHEN (PERCOCET) 5-325 MG PER TABLET    Take 2 tablets by mouth every 4 (four) hours as needed for pain.     I personally performed the services described in this documentation, which was scribed in my presence. The recorded information has been reviewed and considered.            Lyanne Co, MD 10/04/11 [phone]

## 2011-10-04 NOTE — ED Notes (Signed)
Pt states left thigh pain after stretching today

## 2011-10-05 LAB — PROTIME-INR: Prothrombin Time: 14.1

## 2011-12-10 ENCOUNTER — Encounter: Payer: Self-pay | Admitting: Internal Medicine

## 2012-09-28 ENCOUNTER — Encounter: Payer: Self-pay | Admitting: Gastroenterology

## 2013-01-19 ENCOUNTER — Other Ambulatory Visit (HOSPITAL_COMMUNITY): Payer: Self-pay | Admitting: Physician Assistant

## 2013-01-19 DIAGNOSIS — R609 Edema, unspecified: Secondary | ICD-10-CM

## 2013-01-23 ENCOUNTER — Ambulatory Visit (HOSPITAL_COMMUNITY)
Admission: RE | Admit: 2013-01-23 | Discharge: 2013-01-23 | Disposition: A | Discharge status: Home / Self Care | Payer: BC Managed Care – HMO | Source: Ambulatory Visit | Attending: Internal Medicine | Admitting: Internal Medicine

## 2013-01-23 DIAGNOSIS — R609 Edema, unspecified: Secondary | ICD-10-CM

## 2013-01-23 NOTE — Progress Notes (Signed)
Left lower extremity venous duplex completed.  01/23/2013  Gertie Fey, RDMS, RDCS

## 2013-02-16 ENCOUNTER — Encounter: Payer: Self-pay | Admitting: Family Medicine

## 2013-02-16 ENCOUNTER — Ambulatory Visit (INDEPENDENT_AMBULATORY_CARE_PROVIDER_SITE_OTHER): Payer: BC Managed Care – PPO | Admitting: Family Medicine

## 2013-02-16 ENCOUNTER — Telehealth: Payer: Self-pay

## 2013-02-16 ENCOUNTER — Other Ambulatory Visit: Payer: Self-pay | Admitting: Family Medicine

## 2013-02-16 ENCOUNTER — Ambulatory Visit
Admission: RE | Admit: 2013-02-16 | Discharge: 2013-02-16 | Disposition: A | Discharge status: Home / Self Care | Payer: BC Managed Care – PPO | Source: Ambulatory Visit | Attending: Family Medicine | Admitting: Family Medicine

## 2013-02-16 VITALS — BP 120/78 | HR 70 | Wt 227.0 lb

## 2013-02-16 DIAGNOSIS — M25469 Effusion, unspecified knee: Secondary | ICD-10-CM

## 2013-02-16 DIAGNOSIS — M25462 Effusion, left knee: Secondary | ICD-10-CM

## 2013-02-16 DIAGNOSIS — Z7901 Long term (current) use of anticoagulants: Secondary | ICD-10-CM

## 2013-02-16 DIAGNOSIS — Z8546 Personal history of malignant neoplasm of prostate: Secondary | ICD-10-CM | POA: Insufficient documentation

## 2013-02-16 DIAGNOSIS — Z8679 Personal history of other diseases of the circulatory system: Secondary | ICD-10-CM

## 2013-02-16 LAB — COMPREHENSIVE METABOLIC PANEL
ALT: 46 U/L (ref 0–53)
AST: 40 U/L — ABNORMAL HIGH (ref 0–37)
CO2: 24 mEq/L (ref 19–32)
Sodium: 142 mEq/L (ref 135–145)
Total Bilirubin: 0.6 mg/dL (ref 0.3–1.2)
Total Protein: 7 g/dL (ref 6.0–8.3)

## 2013-02-16 LAB — CBC WITH DIFFERENTIAL/PLATELET
Basophils Absolute: 0 10*3/uL (ref 0.0–0.1)
Eosinophils Absolute: 0.1 10*3/uL (ref 0.0–0.7)
Lymphocytes Relative: 28 % (ref 12–46)
Lymphs Abs: 2 10*3/uL (ref 0.7–4.0)
Neutrophils Relative %: 58 % (ref 43–77)
Platelets: 228 10*3/uL (ref 150–400)
RBC: 5.27 MIL/uL (ref 4.22–5.81)
RDW: 14.3 % (ref 11.5–15.5)
WBC: 7.3 10*3/uL (ref 4.0–10.5)

## 2013-02-16 LAB — LIPID PANEL
Cholesterol: 143 mg/dL (ref 0–200)
LDL Cholesterol: 80 mg/dL (ref 0–99)
Total CHOL/HDL Ratio: 2.8 Ratio
VLDL: 11 mg/dL (ref 0–40)

## 2013-02-16 LAB — URIC ACID: Uric Acid, Serum: 6.8 mg/dL (ref 4.0–7.8)

## 2013-02-16 NOTE — Telephone Encounter (Signed)
PT LEFT BEFORE LABS COULD BE DRAWN SO I CALL ED PT LEFT MESSAGE IF HE COULD PLEASE COME BACK AND HAVE LABS DRAWN OR CALL ME BACK

## 2013-02-16 NOTE — Progress Notes (Signed)
  Subjective:    Patient ID: Jordan [name], male    DOB: [birth year], 67 y.o.   MRN: [id number]  HPI He is here for evaluation of swelling in his left leg and knee. He has a remote history of injury to the knee 3 weeks ago but did not think much of it. He was recently seen for DVT and apparently the study was negative. He is here for followup. He has a previous history of prostate cancer and atrial fibrillation. He is presently on medications listed in the chart. He has no other joint involvement. No fever, chills, shortness of breath. He does followup with urology and has seen cardiology on a regular basis but has not had a complete exam in quite some time.   Review of Systems     Objective:   Physical Exam Alert and in no distress. React exam shows a regular rhythm without murmurs or gallops. Lungs are clear to auscultation. Left knee does show an effusion but it is not warm or tender. Ligaments are intact. Negative anterior drawer and memory testing.       Assessment & Plan:  Knee effusion, left - Plan: Uric Acid, DG Knee 1-2 Views Left, Miscellaneous test  History of prostate cancer  History of atrial fibrillation  Long term (current) use of anticoagulants - Plan: Lipid panel, CBC with Differential, Comprehensive metabolic panel 20 cc of clear yellow fluid was removed from the left knee after the knee was prepped with Betadine. A superior lateral approach was taken. He tolerated this procedure well. I will set him up for an x-ray and followup pending blood results. He is also set up for complete examination.

## 2013-02-16 NOTE — Addendum Note (Signed)
Addended by: Lavell Islam on: 02/16/2013 04:34 PM   Modules accepted: Orders

## 2013-02-17 LAB — OTHER SOLSTAS TEST
Crystals, Fluid: NONE SEEN
Glucose, Synovial Fluid: <20 mg/dL
Lymphocytes-Synovial Fld: 53 % — ABNORMAL HIGH (ref 0–20)
Total protein, fluid: 3 g/dL

## 2013-02-17 NOTE — Progress Notes (Signed)
Quick Note:  Let him know that the labs and xray were neg except for some degenerative changes.Follow up with me in one or two weeks ______

## 2013-02-19 NOTE — Progress Notes (Signed)
Quick Note:  CALLED PT CELL LEFT MESSAGE WORD FOR WORD  Result Note   Let him know that the labs and xray were neg except for some degenerative changes.Follow up with me in one or two weeks   ______

## 2013-02-21 NOTE — Progress Notes (Signed)
Let him know that all of his labs look reasonable

## 2013-03-15 ENCOUNTER — Ambulatory Visit: Payer: Self-pay | Admitting: Internal Medicine

## 2013-03-15 DIAGNOSIS — Z7901 Long term (current) use of anticoagulants: Secondary | ICD-10-CM | POA: Insufficient documentation

## 2013-03-15 DIAGNOSIS — I4819 Other persistent atrial fibrillation: Secondary | ICD-10-CM | POA: Insufficient documentation

## 2013-03-15 DIAGNOSIS — I4891 Unspecified atrial fibrillation: Secondary | ICD-10-CM

## 2013-05-11 ENCOUNTER — Ambulatory Visit (INDEPENDENT_AMBULATORY_CARE_PROVIDER_SITE_OTHER): Payer: BC Managed Care – PPO | Admitting: Pharmacist Clinician (PhC)/ Clinical Pharmacy Specialist

## 2013-05-11 ENCOUNTER — Encounter: Payer: Self-pay | Admitting: Pharmacist Clinician (PhC)/ Clinical Pharmacy Specialist

## 2013-05-11 VITALS — BP 120/60 | HR 52

## 2013-05-11 DIAGNOSIS — Z7901 Long term (current) use of anticoagulants: Secondary | ICD-10-CM

## 2013-05-11 DIAGNOSIS — I4891 Unspecified atrial fibrillation: Secondary | ICD-10-CM

## 2013-05-11 LAB — POCT INR: INR: 1.9

## 2013-05-17 ENCOUNTER — Emergency Department (HOSPITAL_BASED_OUTPATIENT_CLINIC_OR_DEPARTMENT_OTHER): Payer: BC Managed Care – PPO

## 2013-05-17 ENCOUNTER — Emergency Department (HOSPITAL_BASED_OUTPATIENT_CLINIC_OR_DEPARTMENT_OTHER)
Admission: EM | Admit: 2013-05-17 | Discharge: 2013-05-17 | Disposition: A | Discharge status: Home / Self Care | Payer: BC Managed Care – PPO | Attending: Emergency Medicine | Admitting: Emergency Medicine

## 2013-05-17 ENCOUNTER — Encounter (HOSPITAL_BASED_OUTPATIENT_CLINIC_OR_DEPARTMENT_OTHER): Payer: Self-pay | Admitting: *Deleted

## 2013-05-17 DIAGNOSIS — Z7982 Long term (current) use of aspirin: Secondary | ICD-10-CM | POA: Insufficient documentation

## 2013-05-17 DIAGNOSIS — Z860100 Personal history of colon polyps, unspecified: Secondary | ICD-10-CM | POA: Insufficient documentation

## 2013-05-17 DIAGNOSIS — Z85038 Personal history of other malignant neoplasm of large intestine: Secondary | ICD-10-CM | POA: Insufficient documentation

## 2013-05-17 DIAGNOSIS — S39012A Strain of muscle, fascia and tendon of lower back, initial encounter: Secondary | ICD-10-CM

## 2013-05-17 DIAGNOSIS — I10 Essential (primary) hypertension: Secondary | ICD-10-CM | POA: Insufficient documentation

## 2013-05-17 DIAGNOSIS — Z8679 Personal history of other diseases of the circulatory system: Secondary | ICD-10-CM | POA: Insufficient documentation

## 2013-05-17 DIAGNOSIS — S8990XA Unspecified injury of unspecified lower leg, initial encounter: Secondary | ICD-10-CM | POA: Insufficient documentation

## 2013-05-17 DIAGNOSIS — Z8719 Personal history of other diseases of the digestive system: Secondary | ICD-10-CM | POA: Insufficient documentation

## 2013-05-17 DIAGNOSIS — I4891 Unspecified atrial fibrillation: Secondary | ICD-10-CM | POA: Insufficient documentation

## 2013-05-17 DIAGNOSIS — Z8619 Personal history of other infectious and parasitic diseases: Secondary | ICD-10-CM | POA: Insufficient documentation

## 2013-05-17 DIAGNOSIS — Z7901 Long term (current) use of anticoagulants: Secondary | ICD-10-CM | POA: Insufficient documentation

## 2013-05-17 DIAGNOSIS — Y9389 Activity, other specified: Secondary | ICD-10-CM | POA: Insufficient documentation

## 2013-05-17 DIAGNOSIS — S335XXA Sprain of ligaments of lumbar spine, initial encounter: Secondary | ICD-10-CM | POA: Insufficient documentation

## 2013-05-17 DIAGNOSIS — Y92410 Unspecified street and highway as the place of occurrence of the external cause: Secondary | ICD-10-CM | POA: Insufficient documentation

## 2013-05-17 DIAGNOSIS — Z8546 Personal history of malignant neoplasm of prostate: Secondary | ICD-10-CM | POA: Insufficient documentation

## 2013-05-17 DIAGNOSIS — S99919A Unspecified injury of unspecified ankle, initial encounter: Secondary | ICD-10-CM | POA: Insufficient documentation

## 2013-05-17 MED ORDER — HYDROCODONE-ACETAMINOPHEN 5-325 MG PO TABS: 1.0000 | ORAL_TABLET | Freq: Four times a day (QID) | ORAL | Status: DC | PRN
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
## 2013-06-25 ENCOUNTER — Ambulatory Visit (INDEPENDENT_AMBULATORY_CARE_PROVIDER_SITE_OTHER): Payer: BC Managed Care – PPO | Admitting: Pharmacist Clinician (PhC)/ Clinical Pharmacy Specialist

## 2013-06-25 VITALS — BP 110/80 | HR 64

## 2013-06-25 DIAGNOSIS — I4891 Unspecified atrial fibrillation: Secondary | ICD-10-CM

## 2013-06-25 DIAGNOSIS — Z7901 Long term (current) use of anticoagulants: Secondary | ICD-10-CM

## 2013-06-25 LAB — POCT INR: INR: 2.1

## 2013-08-06 ENCOUNTER — Ambulatory Visit (INDEPENDENT_AMBULATORY_CARE_PROVIDER_SITE_OTHER): Payer: BC Managed Care – PPO | Admitting: Pharmacist Clinician (PhC)/ Clinical Pharmacy Specialist

## 2013-08-06 VITALS — BP 118/82 | HR 72

## 2013-08-06 DIAGNOSIS — I4891 Unspecified atrial fibrillation: Secondary | ICD-10-CM

## 2013-08-06 DIAGNOSIS — Z7901 Long term (current) use of anticoagulants: Secondary | ICD-10-CM

## 2013-08-06 LAB — POCT INR: INR: 1.4

## 2013-08-23 ENCOUNTER — Encounter: Payer: Self-pay | Admitting: *Deleted

## 2013-09-03 ENCOUNTER — Ambulatory Visit (INDEPENDENT_AMBULATORY_CARE_PROVIDER_SITE_OTHER): Payer: BC Managed Care – PPO | Admitting: Internal Medicine

## 2013-09-03 ENCOUNTER — Encounter: Payer: Self-pay | Admitting: Internal Medicine

## 2013-09-03 ENCOUNTER — Ambulatory Visit (INDEPENDENT_AMBULATORY_CARE_PROVIDER_SITE_OTHER): Payer: BC Managed Care – PPO | Admitting: Pharmacist Clinician (PhC)/ Clinical Pharmacy Specialist

## 2013-09-03 VITALS — BP 142/92 | HR 71 | Ht 72.0 in | Wt 229.7 lb

## 2013-09-03 DIAGNOSIS — Z7901 Long term (current) use of anticoagulants: Secondary | ICD-10-CM

## 2013-09-03 DIAGNOSIS — I4891 Unspecified atrial fibrillation: Secondary | ICD-10-CM

## 2013-09-03 NOTE — Progress Notes (Signed)
OFFICE NOTE  Chief Complaint:  Routine followup  Primary Care Physician: Carollee Herter, MD  HPI:  Jordan [name]. is a 67 year old gentleman with history of paroxysmal A-fib and DVT in the past, as well as hypertension. He has been on Coumadin. He recently had some left lower extremity swelling and underwent Doppler ultrasound. This did not demonstrate any DVT; however, there was no evaluation for superficial reflux. 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 He also has paroxysmal A-fib with no real recurrence recently and he is chronically anticoagulated on Coumadin. His INR had been 3.6. Medication was held and then his INR was then subtherapeutic, restarted and now is at 3.6 today. Unfortunately, I think the dose of Coumadin he is on at 10 mg is just too much for him. He has a history of hypertension which has not been bothersome and he noted that his swelling has come down since he saw our PA about 6 or 7 days ago. He has as a primary care provider who drew some fluid off of his knee. He was fitted with compression stockings and reports marked improvement of his swelling. 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 His INR checked today shows excellent control on his current regimen which is actually a fairly high dose of warfarin at an INR of 2.5  PMHx:  Past Medical History  Diagnosis Date  . Hypertension   . Atrial fibrillation     coumadin  . Prostate cancer     radical prostatectomy   . Colon cancer     family hx  . Personal history of colonic polyps   . Atrial fib/flutter, transient     Hx  . Hepatitis C   . Diverticulosis   . Hemorrhoids   . History of DVT (deep vein thrombosis)   . Non-obstructive hypertrophic cardiomyopathy     history of   . History of nuclear stress test 04/2002    exercise; normal study     Past Surgical History   Procedure Laterality Date  . Prostatectomy    . Prostatectomy  04-04    Dr. Isabel Caprice  . Colonoscopy  2956,2130    Lavina Hamman  . Cardiac catheterization  2007    no occlusive CAD  . Transthoracic echocardiogram  05/2006    EF ~60%; LV systolic function normal; mild focal basal septal hypertrophy; AV thickness mildly increased; LA mod-markedly dilate    FAMHx:  Family History  Problem Relation Age of Onset  . Brain cancer Mother     brain tumor  . Stroke Father     SOCHx:   reports that he has never smoked. He has never used smokeless tobacco. He reports that he drinks about 1.0 ounces of alcohol per week. He reports that he does not use illicit drugs.  ALLERGIES:  No Known Allergies  ROS: A comprehensive review of systems was negative except for: Cardiovascular: positive for lower extremity edema  HOME MEDS: Current Outpatient Prescriptions  Medication Sig Dispense Refill  . aspirin 81 MG chewable tablet Chew 81 mg by mouth daily.      Marland Kitchen diltiazem (TIAZAC) 360 MG 24 hr capsule Take 360 mg by mouth daily.      . Sildenafil Citrate (VIAGRA PO) Take by mouth as needed.      . warfarin (COUMADIN) 7.5 MG tablet Take by mouth daily. Per  INR       No current facility-administered medications for this visit.    LABS/IMAGING: No results found for this or any previous visit (from the past 48 hour(s)). No results found.  VITALS: BP 142/92  Pulse 71  Ht 6' (1.829 m)  Wt 229 lb 11.2 oz (104.191 kg)  BMI 31.15 kg/m2  EXAM: General appearance: alert and no distress Neck: no adenopathy, no carotid bruit, no JVD, supple, symmetrical, trachea midline and thyroid not enlarged, symmetric, no tenderness/mass/nodules Lungs: clear to auscultation bilaterally Heart: irregularly irregular rhythm Abdomen: soft, non-tender; bowel sounds normal; no masses,  no organomegaly Extremities: extremities normal, atraumatic, no cyanosis or edema Pulses: 2+ and symmetric Skin: Skin color,  texture, turgor normal. No rashes or lesions Neurologic: Grossly normal  EKG: Atrial fibrillation at 71  ASSESSMENT: 1. Paroxysmal atrial fibrillation-currently in atrial fibrillation (asymptomatic) 2. 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 History of DVT 4. History of cancer 5. Hypertension-controlled  PLAN: 1.   Mr. Lonon continues to go in and out of atrial fibrillation. His last visit he was in sinus today he is in A. fib. He is completely unaware of this. He is well controlled on warfarin which he takes mostly for A. fib but does have a history of a solitary DVT in the past. He most likely has posttraumatic syndrome of the left leg but has had improvement in his swelling with a compression stocking. He does have a small Baker's cyst behind the left knee. Recently he had some fluid drawn off the knee, but has had no recurrence. Overall he is doing really well and will continue his current medications. I plan to see him back in a year and he will be seen by Belenda Cruise monthly.  Chrystie Nose, MD, [facility] Attending Cardiologist [facility]  Bena Kobel C 09/03/2013, 3:50 PM

## 2013-09-03 NOTE — Patient Instructions (Signed)
Follow-up annually

## 2013-09-04 ENCOUNTER — Encounter: Payer: Self-pay | Admitting: Internal Medicine

## 2013-09-19 ENCOUNTER — Encounter: Payer: Self-pay | Admitting: Family Medicine

## 2013-10-31 ENCOUNTER — Encounter: Payer: Self-pay | Admitting: Gastroenterology

## 2013-10-31 ENCOUNTER — Encounter: Payer: Self-pay | Admitting: Family Medicine

## 2013-10-31 ENCOUNTER — Ambulatory Visit (INDEPENDENT_AMBULATORY_CARE_PROVIDER_SITE_OTHER): Payer: BC Managed Care – PPO | Admitting: Family Medicine

## 2013-10-31 VITALS — BP 128/80 | HR 70 | Ht 72.0 in | Wt 228.0 lb

## 2013-10-31 DIAGNOSIS — Z8546 Personal history of malignant neoplasm of prostate: Secondary | ICD-10-CM

## 2013-10-31 DIAGNOSIS — I4891 Unspecified atrial fibrillation: Secondary | ICD-10-CM

## 2013-10-31 DIAGNOSIS — Z0000 Encounter for general adult medical examination without abnormal findings: Secondary | ICD-10-CM

## 2013-10-31 DIAGNOSIS — Z23 Encounter for immunization: Secondary | ICD-10-CM

## 2013-10-31 LAB — POCT URINALYSIS DIPSTICK
Blood, UA: NEGATIVE
Ketones, UA: NEGATIVE
Spec Grav, UA: 1.02
Urobilinogen, UA: NEGATIVE
pH, UA: 5

## 2013-10-31 NOTE — Progress Notes (Signed)
  Subjective:    Patient ID: Jordan Oms., male    DOB: 26-Nov-1946, 67 y.o.   MRN: [id number]  HPI    Review of Systems     Objective:   Physical Exam        Assessment & Plan:  Advanced directive was also discussed with him.

## 2013-10-31 NOTE — Progress Notes (Signed)
Subjective:    Patient ID: Jordan Oms., male    DOB: Jan 17, 1946, 67 y.o.   MRN: [id number]  HPI He is here for a complete examination. He does have a history of prostate cancer and has had surgery. He does have some urinary incontinence but is handling this well with pads. He also has atrial fibrillation and continues on Coumadin. He is comfortable with this particular medication. His family and social history were reviewed. He continues to work and has no plans to retire. Health maintenance was reviewed.   Review of Systems  Constitutional: Negative.   HENT: Negative.   Eyes: Negative.   Respiratory: Negative.   Cardiovascular: Negative.   Gastrointestinal: Negative.   Endocrine: Negative.   Genitourinary: Positive for urgency.  Musculoskeletal: Negative.   Allergic/Immunologic: Negative.   Neurological: Negative.   Hematological: Negative.   Psychiatric/Behavioral: Negative.        Objective:   Physical Exam BP 128/80  Pulse 70  Ht 6' (1.829 m)  Wt 228 lb (103.42 kg)  BMI 30.92 kg/m2  SpO2 98%  General Appearance:    Alert, cooperative, no distress, appears stated age  Head:    Normocephalic, without obvious abnormality, atraumatic  Eyes:    PERRL, conjunctiva/corneas clear, EOM's intact, fundi    benign  Ears:    Normal TM's and external ear canals  Nose:   Nares normal, mucosa normal, no drainage or sinus   tenderness  Throat:   Lips, mucosa, and tongue normal; teeth and gums normal  Neck:   Supple, no lymphadenopathy;  thyroid:  no   enlargement/tenderness/nodules; no carotid   bruit or JVD  Back:    Spine nontender, no curvature, ROM normal, no CVA     tenderness  Lungs:     Clear to auscultation bilaterally without wheezes, rales or     ronchi; respirations unlabored  Chest Wall:    No tenderness or deformity   Heart:    irregular rate and  rhythm, S1 and S2 normal, no murmur, rub   or gallop  Breast Exam:    No chest wall tenderness, masses or  gynecomastia  Abdomen:     Soft, non-tender, nondistended, normoactive bowel sounds,    no masses, no hepatosplenomegaly  Genitalia:    Normal male external genitalia without lesions.  Testicles without masses.  No inguinal hernias.  Rectal:    Normal sphincter tone, no masses or tenderness; guaiac negative stool.  Prostate smooth, no nodules, not enlarged.  Extremities:   No clubbing, cyanosis or edema  Pulses:   2+ and symmetric all extremities  Skin:   Skin color, texture, turgor normal, no rashes or lesions  Lymph nodes:   Cervical, supraclavicular, and axillary nodes normal  Neurologic:   CNII-XII intact, normal strength, sensation and gait; reflexes 2+ and symmetric throughout          Psych:   Normal mood, affect, hygiene and grooming.          Assessment & Plan:  Routine general medical examination at a health care facility - Plan: POCT Urinalysis Dipstick  Atrial fibrillation  History of prostate cancer  Need for prophylactic vaccination and inoculation against influenza - Plan: Flu vaccine HIGH DOSE PF (Fluzone Tri High dose)  Need for prophylactic vaccination and inoculation against unspecified single disease - Plan: Pneumococcal polysaccharide vaccine 23-valent greater than or equal to 2yo subcutaneous/IM, Tdap vaccine greater than or equal to 7yo IM  I encouraged him to continue to take  good care of himself. Discussed the possibility of him stopping Coumadin and starting XARELTO. He is comfortable with his present dosing regimen. I will update his immunizations. Encouraged him to continue to work as long as he still enjoys it.

## 2013-11-05 ENCOUNTER — Ambulatory Visit: Payer: BC Managed Care – PPO | Admitting: Gastroenterology

## 2013-11-06 ENCOUNTER — Encounter: Payer: Self-pay | Admitting: Gastroenterology

## 2014-01-28 ENCOUNTER — Ambulatory Visit (INDEPENDENT_AMBULATORY_CARE_PROVIDER_SITE_OTHER): Payer: BC Managed Care – PPO | Admitting: Pharmacist Clinician (PhC)/ Clinical Pharmacy Specialist

## 2014-01-28 VITALS — BP 110/70 | HR 48

## 2014-01-28 DIAGNOSIS — I4891 Unspecified atrial fibrillation: Secondary | ICD-10-CM

## 2014-01-28 DIAGNOSIS — Z7901 Long term (current) use of anticoagulants: Secondary | ICD-10-CM

## 2014-01-28 LAB — POCT INR: INR: 1.5

## 2014-02-11 ENCOUNTER — Ambulatory Visit: Payer: BC Managed Care – PPO | Admitting: Pharmacist Clinician (PhC)/ Clinical Pharmacy Specialist

## 2014-03-04 ENCOUNTER — Other Ambulatory Visit: Payer: Self-pay | Admitting: Internal Medicine

## 2014-03-04 NOTE — Telephone Encounter (Signed)
Rx was sent to pharmacy electronically. 

## 2014-04-12 ENCOUNTER — Ambulatory Visit: Payer: BC Managed Care – PPO | Admitting: Pharmacist Clinician (PhC)/ Clinical Pharmacy Specialist

## 2014-05-16 ENCOUNTER — Other Ambulatory Visit: Payer: Self-pay | Admitting: Pharmacist Clinician (PhC)/ Clinical Pharmacy Specialist

## 2014-06-10 ENCOUNTER — Ambulatory Visit (INDEPENDENT_AMBULATORY_CARE_PROVIDER_SITE_OTHER): Payer: BC Managed Care – PPO | Admitting: Pharmacist Clinician (PhC)/ Clinical Pharmacy Specialist

## 2014-06-10 DIAGNOSIS — Z7901 Long term (current) use of anticoagulants: Secondary | ICD-10-CM

## 2014-06-10 DIAGNOSIS — I4891 Unspecified atrial fibrillation: Secondary | ICD-10-CM

## 2014-06-10 LAB — POCT INR: INR: 2.5

## 2014-06-28 ENCOUNTER — Other Ambulatory Visit: Payer: Self-pay | Admitting: Pharmacist Clinician (PhC)/ Clinical Pharmacy Specialist

## 2014-07-01 ENCOUNTER — Other Ambulatory Visit: Payer: Self-pay | Admitting: *Deleted

## 2014-07-01 NOTE — Telephone Encounter (Signed)
Rx was sent to pharmacy electronically. 

## 2014-07-22 ENCOUNTER — Ambulatory Visit (INDEPENDENT_AMBULATORY_CARE_PROVIDER_SITE_OTHER): Payer: BC Managed Care – PPO | Admitting: Pharmacist Clinician (PhC)/ Clinical Pharmacy Specialist

## 2014-07-22 DIAGNOSIS — Z7901 Long term (current) use of anticoagulants: Secondary | ICD-10-CM

## 2014-07-22 DIAGNOSIS — I4891 Unspecified atrial fibrillation: Secondary | ICD-10-CM

## 2014-07-22 LAB — POCT INR: INR: 4.5

## 2014-08-12 ENCOUNTER — Ambulatory Visit: Payer: BC Managed Care – PPO | Admitting: Pharmacist Clinician (PhC)/ Clinical Pharmacy Specialist

## 2014-09-13 ENCOUNTER — Ambulatory Visit (INDEPENDENT_AMBULATORY_CARE_PROVIDER_SITE_OTHER): Payer: BC Managed Care – PPO | Admitting: Pharmacist Clinician (PhC)/ Clinical Pharmacy Specialist

## 2014-09-13 DIAGNOSIS — I4891 Unspecified atrial fibrillation: Secondary | ICD-10-CM

## 2014-09-13 DIAGNOSIS — Z7901 Long term (current) use of anticoagulants: Secondary | ICD-10-CM

## 2014-09-13 LAB — POCT INR: INR: 2.5

## 2014-10-17 ENCOUNTER — Other Ambulatory Visit: Payer: Self-pay | Admitting: Internal Medicine

## 2014-10-25 ENCOUNTER — Ambulatory Visit: Payer: BC Managed Care – PPO | Admitting: Pharmacist Clinician (PhC)/ Clinical Pharmacy Specialist

## 2014-12-23 ENCOUNTER — Telehealth: Payer: Self-pay | Admitting: Internal Medicine

## 2014-12-23 MED ORDER — DILTIAZEM HCL ER COATED BEADS 360 MG PO CP24: 360.0000 mg | ORAL_CAPSULE | Freq: Every day | ORAL | Status: DC

## 2014-12-23 NOTE — Telephone Encounter (Signed)
Spoke with patient, he stated regular pharmacy was out of Cardizem and he needed refill today. Pt refill submitted to other pharmacy of choice.

## 2014-12-23 NOTE — Telephone Encounter (Signed)
Pt called in stating that he is out of his Cardizem and his pharmacy does not have any in stock. He would like to be advised on what to do next. Please call  Thanks

## 2015-01-06 ENCOUNTER — Other Ambulatory Visit: Payer: Self-pay | Admitting: Family Medicine

## 2015-01-06 ENCOUNTER — Ambulatory Visit (INDEPENDENT_AMBULATORY_CARE_PROVIDER_SITE_OTHER): Payer: BLUE CROSS/BLUE SHIELD | Admitting: Family Medicine

## 2015-01-06 ENCOUNTER — Encounter: Payer: Self-pay | Admitting: Family Medicine

## 2015-01-06 VITALS — BP 116/70 | HR 68 | Ht 72.0 in | Wt 222.0 lb

## 2015-01-06 DIAGNOSIS — Z0000 Encounter for general adult medical examination without abnormal findings: Secondary | ICD-10-CM

## 2015-01-06 DIAGNOSIS — Z8546 Personal history of malignant neoplasm of prostate: Secondary | ICD-10-CM

## 2015-01-06 DIAGNOSIS — Z23 Encounter for immunization: Secondary | ICD-10-CM

## 2015-01-06 DIAGNOSIS — Z7901 Long term (current) use of anticoagulants: Secondary | ICD-10-CM

## 2015-01-06 DIAGNOSIS — I482 Chronic atrial fibrillation, unspecified: Secondary | ICD-10-CM

## 2015-01-06 LAB — POCT URINALYSIS DIPSTICK
Bilirubin, UA: NEGATIVE
Glucose, UA: NEGATIVE
Ketones, UA: NEGATIVE
LEUKOCYTES UA: NEGATIVE
Nitrite, UA: NEGATIVE
PH UA: 6
PROTEIN UA: NEGATIVE
RBC UA: NEGATIVE
SPEC GRAV UA: 1.025
Urobilinogen, UA: NEGATIVE

## 2015-01-06 LAB — CBC WITH DIFFERENTIAL/PLATELET
BASOS ABS: 0 10*3/uL (ref 0.0–0.1)
BASOS PCT: 0 % (ref 0–1)
EOS PCT: 1 % (ref 0–5)
Eosinophils Absolute: 0.1 10*3/uL (ref 0.0–0.7)
HCT: 53.1 % — ABNORMAL HIGH (ref 39.0–52.0)
Hemoglobin: 18.6 g/dL — ABNORMAL HIGH (ref 13.0–17.0)
LYMPHS ABS: 2.5 10*3/uL (ref 0.7–4.0)
LYMPHS PCT: 29 % (ref 12–46)
MCH: 30.5 pg (ref 26.0–34.0)
MCHC: 35 g/dL (ref 30.0–36.0)
MCV: 87 fL (ref 78.0–100.0)
MONO ABS: 1.2 10*3/uL — AB (ref 0.1–1.0)
MONOS PCT: 14 % — AB (ref 3–12)
MPV: 10.7 fL (ref 8.6–12.4)
NEUTROS PCT: 56 % (ref 43–77)
Neutro Abs: 4.8 10*3/uL (ref 1.7–7.7)
Platelets: 237 10*3/uL (ref 150–400)
RBC: 6.1 MIL/uL — ABNORMAL HIGH (ref 4.22–5.81)
RDW: 14.1 % (ref 11.5–15.5)
WBC: 8.6 10*3/uL (ref 4.0–10.5)

## 2015-01-06 LAB — COMPREHENSIVE METABOLIC PANEL
ALBUMIN: 4 g/dL (ref 3.5–5.2)
ALK PHOS: 84 U/L (ref 39–117)
ALT: 58 U/L — ABNORMAL HIGH (ref 0–53)
AST: 53 U/L — ABNORMAL HIGH (ref 0–37)
BUN: 25 mg/dL — AB (ref 6–23)
CHLORIDE: 107 meq/L (ref 96–112)
CO2: 24 mEq/L (ref 19–32)
Calcium: 9.3 mg/dL (ref 8.4–10.5)
Creat: 1.24 mg/dL (ref 0.50–1.35)
Glucose, Bld: 92 mg/dL (ref 70–99)
POTASSIUM: 4.3 meq/L (ref 3.5–5.3)
Sodium: 138 mEq/L (ref 135–145)
TOTAL PROTEIN: 8 g/dL (ref 6.0–8.3)
Total Bilirubin: 1 mg/dL (ref 0.2–1.2)

## 2015-01-06 LAB — LIPID PANEL
CHOL/HDL RATIO: 2.6 ratio
CHOLESTEROL: 159 mg/dL (ref 0–200)
HDL: 62 mg/dL (ref >39)
LDL Cholesterol: 82 mg/dL (ref 0–99)
TRIGLYCERIDES: 74 mg/dL (ref <150)
VLDL: 15 mg/dL (ref 0–40)

## 2015-01-06 NOTE — Progress Notes (Signed)
   Subjective:    Patient ID: Jordan Kelch., male    DOB: February 13, 1946, 69 y.o.   MRN: [id number]  HPI He is here for complete examination. He does have a history of chronic atrial fibrillation and is on Coumadin. He is very comfortable with being on Coumadin. He also has history of prostate cancer diagnosed in 2004. He does occasionally leak and does wear padding otherwise he is doing quite well and does use Viagra with good results. He is not married but does have a girlfriend. Otherwise he has no particular concerns or complaints. 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 Family and social history was reviewed. Health maintenance and immunizations were also reviewed. He does plan on getting involved in an exercise and weight reduction program.  Review of Systems  All other systems reviewed and are negative.      Objective:   Physical Exam BP 116/70 mmHg  Pulse 68  Ht 6' (1.829 m)  Wt 222 lb (100.699 kg)  BMI 30.10 kg/m2  SpO2 99%  General Appearance:    Alert, cooperative, no distress, appears stated age  Head:    Normocephalic, without obvious abnormality, atraumatic  Eyes:    PERRL, conjunctiva/corneas clear, EOM's intact, fundi    benign  Ears:    Normal TM's and external ear canals  Nose:   Nares normal, mucosa normal, no drainage or sinus   tenderness  Throat:   Lips, mucosa, and tongue normal; teeth and gums normal  Neck:   Supple, no lymphadenopathy;  thyroid:  no   enlargement/tender ness/nodules; no carotid   bruit or JVD  Back:    Spine nontender, no curvature, ROM normal, no CVA     tenderness  Lungs:     Clear to auscultation bilaterally without wheezes, rales or     ronchi; respirations unlabored  Chest Wall:    No tenderness or deformity   Heart:    Regular rate and rhythm, S1 and S2 normal, no murmur, rub   or gallop  Breast Exam:    No chest wall tenderness, masses or gynecomastia  Abdomen:      Soft, non-tender, nondistended, normoactive bowel sounds,    no masses, no hepatosplenomegaly        Extremities:   No clubbing, cyanosis or edema  Pulses:   2+ and symmetric all extremities  Skin:   Skin color, texture, turgor normal, no rashes or lesions  Lymph nodes:   Cervical, supraclavicular, and axillary nodes normal  Neurologic:   CNII-XII intact, normal strength, sensation and gait; reflexes 2+ and symmetric throughout          Psych:   Normal mood, affect, hygiene and grooming.          Assessment & Plan:  Routine general medical examination at a health care facility - Plan: POCT Urinalysis Dipstick  Chronic atrial fibrillation - Plan: Comprehensive metabolic panel, CBC with Differential, Lipid panel  History of prostate cancer  Long term current use of anticoagulant therapy  Need for prophylactic vaccination against Streptococcus pneumoniae (pneumococcus) - Plan: Pneumococcal conjugate vaccine 13-valent  Need for prophylactic vaccination and inoculation against influenza - Plan: Flu vaccine HIGH DOSE PF (Fluzone Tri High dose)  encouraged him to get into an exercise program. Also discussed eating habits especially in regard to cutting down on carbohydrates. Encouraged physical activity up to 150 minutes per week. Also prescription written for Zostavax

## 2015-01-06 NOTE — Patient Instructions (Signed)
Cut back on "white food". Red, rice, pasta, potatoes and sugar 150 minutes a week of something physical

## 2015-01-07 LAB — HEPATITIS C ANTIBODY: HCV Ab: REACTIVE — AB

## 2015-01-08 LAB — HEPATITIS C RNA QUANTITATIVE

## 2015-01-13 ENCOUNTER — Ambulatory Visit (INDEPENDENT_AMBULATORY_CARE_PROVIDER_SITE_OTHER): Payer: BLUE CROSS/BLUE SHIELD | Admitting: Family Medicine

## 2015-01-13 DIAGNOSIS — B1920 Unspecified viral hepatitis C without hepatic coma: Secondary | ICD-10-CM

## 2015-01-13 NOTE — Progress Notes (Signed)
   Subjective:    Patient ID: Jordan [name]., male    DOB: July 28, 1946, 69 y.o.   MRN: [id number]  HPI He returns for consult concerning recent blood work which did show hepatitis C positive. He is here for further consultation concerning this.   Review of Systems     Objective:   Physical Exam Alert and in no distress otherwise not examined       Assessment & Plan:  Hepatitis C virus infection without hepatic coma, unspecified chronicity - Plan: Hepatitis C Genotype, Hepatitis C RNA quantitative  I explained the diagnosis of hepatitis C to him and potential ways that he could've obtained it. I explained the potential natural history of this including potentially being clear of the disease at this point all the way to liver failure, cancer and death if not treated properly. Discussed possible follow-up and medications that are available if he has the appropriate genotype. He expressed understanding of this. Information was given and I encouraged him to google hepatitis C

## 2015-01-13 NOTE — Patient Instructions (Signed)
Hepatitis C  Hepatitis C is a viral infection of the liver. Infection may go undetected for months or years because symptoms may be absent or very mild. Chronic liver disease is the main danger of hepatitis C. This may lead to scarring of the liver (cirrhosis), liver failure, and liver cancer.  CAUSES   Hepatitis C is caused by the hepatitis C virus (HCV). Formerly, hepatitis C infections were most commonly transmitted through blood transfusions. In the early 1990s, routine testing of donated blood for hepatitis C and exclusion of blood that tests positive for HCV began. Now, HCV is most commonly transmitted from person to person through injection drug use, sharing needles, or sex with an infected person. A caregiver may also get the infection from exposure to the blood of an infected patient by way of a cut or needle stick.   SYMPTOMS   Acute Phase  Many cases of acute HCV infection are mild and cause few problems. Some people may not even realize they are sick. Symptoms in others may last a few weeks to several months and include:  · Feeling very tired.  · Loss of appetite.  · Nausea.  · Vomiting.  · Abdominal pain.  · Dark yellow urine.  · Yellow skin and eyes (jaundice).  · Itching of the skin.  Chronic Phase  · Between 50% to 85% of people who get HCV infection become "chronic carriers." They often have no symptoms, but the virus stays in their body. They may spread the virus to others and can get long-term liver disease.  · Many people with chronic HCV infection remain healthy for many years. However, up to 1 in 5 chronically infected people may develop severe liver diseases including scarring of the liver (cirrhosis), liver failure, or liver cancer.  DIAGNOSIS   Diagnosis of hepatitis C infection is made by testing blood for the presence of hepatitis C viral particles called RNA. Other tests may also be done to measure the status of current liver function, exclude other liver problems, or assess liver  damage.  TREATMENT   Treatment with many antiviral drugs is available and recommended for some patients with chronic HCV infection. Drug treatment is generally considered appropriate for patients who:  · Are 18 years of age or older.  · Have a positive test for HCV particles in the blood.  · Have a liver tissue sample (biopsy) that shows chronic hepatitis and significant scarring (fibrosis).  · Do not have signs of liver failure.  · Have acceptable blood test results that confirm the wellness of other body organs.  · Are willing to be treated and conform to treatment requirements.  · Have no other circumstances that would prevent treatment from being recommended (contraindications).  All people who are offered and choose to receive drug treatment must understand that careful medical follow up for many months and even years is crucial in order to make successful care possible. The goal of drug treatment is to eliminate any evidence of HCV in the blood on a long-term basis. This is called a "sustained virologic response" or SVR. Achieving a SVR is associated with a decrease in the chance of life-threatening liver problems, need for a liver transplant, liver cancer rates, and liver-related complications.  Successful treatment currently requires taking treatment drugs for at least 24 weeks and up to 72 weeks. An injected drug (interferon) given weekly and an oral antiviral medicine taken daily are usually prescribed. Side effects from these drugs are common and some may be very   serious. Your response to treatment must be carefully monitored by both you and your caregiver throughout the entire treatment period.  PREVENTION  There is no vaccine for hepatitis C. The only way to prevent the disease is to reduce the risk of exposure to the virus.   · Avoid sharing drug needles or personal items like toothbrushes, razors, and nail clippers with an infected person.  · Healthcare workers need to avoid injuries and wear  appropriate protective equipment such as gloves, gowns, and face masks when performing invasive medical or nursing procedures.  HOME CARE INSTRUCTIONS   To avoid making your liver disease worse:  · Strictly avoid drinking alcohol.  · Carefully review all new prescriptions of medicines with your caregiver. Ask your caregiver which drugs you should avoid. The following drugs are toxic to the liver, and your caregiver may tell you to avoid them:  ¨ Isoniazid.  ¨ Methyldopa.  ¨ Acetaminophen.  ¨ Anabolic steroids (muscle-building drugs).  ¨ Erythromycin.  ¨ Oral contraceptives (birth control pills).  · Check with your caregiver to make sure medicine you are currently taking will not be harmful.  · Periodic blood tests may be required. Follow your caregiver's advice about when you should have blood tests.  · Avoid a sexual relationship until advised otherwise by your caregiver.  · Avoid activities that could expose other people to your blood. Examples include sharing a toothbrush, nail clippers, razors, and needles.  · Bed rest is not necessary, but it may make you feel better. Recovery time is not related to the amount of rest you receive.  · This infection is contagious. Follow your caregiver's instructions in order to avoid spread of the infection.  SEEK IMMEDIATE MEDICAL CARE IF:  · You have increasing fatigue or weakness.  · You have an oral temperature above 102° F (38.9° C), not controlled by medicine.  · You develop loss of appetite, nausea, or vomiting.  · You develop jaundice.  · You develop easy bruising or bleeding.  · You develop any severe problems as a result of your treatment.  MAKE SURE YOU:   · Understand these instructions.  · Will watch your condition.  · Will get help right away if you are not doing well or get worse.  Document Released: 12/10/2000 Document Revised: 03/06/2012 Document Reviewed: 03/27/2014  ExitCare® Patient Information ©2015 [employer]. This information is not intended to replace  advice given to you by your health care provider. Make sure you discuss any questions you have with your health care provider.

## 2015-01-15 LAB — HEPATITIS C RNA QUANTITATIVE
HCV QUANT: 617875 [IU]/mL — AB (ref <15)
HCV Quantitative Log: 5.79 {Log} — ABNORMAL HIGH (ref <1.18)

## 2015-01-16 LAB — HEPATITIS C GENOTYPE

## 2015-01-28 ENCOUNTER — Other Ambulatory Visit: Payer: Self-pay | Admitting: Internal Medicine

## 2015-01-28 NOTE — Telephone Encounter (Signed)
Rx(s) sent to pharmacy electronically.  

## 2015-01-30 ENCOUNTER — Other Ambulatory Visit: Payer: Self-pay | Admitting: Internal Medicine

## 2015-02-20 ENCOUNTER — Other Ambulatory Visit: Payer: Self-pay | Admitting: Internal Medicine

## 2015-02-20 NOTE — Telephone Encounter (Signed)
Rx has been sent to the pharmacy electronically. ° °

## 2015-03-04 ENCOUNTER — Ambulatory Visit (INDEPENDENT_AMBULATORY_CARE_PROVIDER_SITE_OTHER): Payer: BLUE CROSS/BLUE SHIELD | Admitting: Internal Medicine

## 2015-03-04 ENCOUNTER — Ambulatory Visit (INDEPENDENT_AMBULATORY_CARE_PROVIDER_SITE_OTHER): Payer: BLUE CROSS/BLUE SHIELD | Admitting: *Deleted

## 2015-03-04 ENCOUNTER — Encounter: Payer: Self-pay | Admitting: Internal Medicine

## 2015-03-04 VITALS — BP 130/80 | HR 101 | Ht 72.0 in | Wt 225.9 lb

## 2015-03-04 DIAGNOSIS — Z7901 Long term (current) use of anticoagulants: Secondary | ICD-10-CM

## 2015-03-04 DIAGNOSIS — I4891 Unspecified atrial fibrillation: Secondary | ICD-10-CM

## 2015-03-04 DIAGNOSIS — I480 Paroxysmal atrial fibrillation: Secondary | ICD-10-CM

## 2015-03-04 LAB — POCT INR: INR: 3.7

## 2015-03-04 NOTE — Patient Instructions (Signed)
Your physician wants you to follow-up in: 1 year with Dr. Hilty. You will receive a reminder letter in the mail two months in advance. If you don't receive a letter, please call our office to schedule the follow-up appointment.  

## 2015-03-04 NOTE — Progress Notes (Signed)
OFFICE NOTE  Chief Complaint:  Routine followup  Primary Care Physician: Wyatt Haste, MD  HPI:  Jordan [name]. is a 69 year old gentleman with history of paroxysmal A-fib and DVT in the past, as well as hypertension. He has been on Coumadin. He recently had some left lower extremity swelling and underwent Doppler ultrasound. This did not demonstrate any DVT; however, there was no evaluation for superficial reflux. 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 He also has paroxysmal A-fib with no real recurrence recently and he is chronically anticoagulated on Coumadin. His INR had been 3.6. Medication was held and then his INR was then subtherapeutic, restarted and now is at 3.6 today. Unfortunately, I think the dose of Coumadin he is on at 10 mg is just too much for him. He has a history of hypertension which has not been bothersome and he noted that his swelling has come down since he saw our PA about 6 or 7 days ago. He has as a primary care provider who drew some fluid off of his knee. He was fitted with compression stockings and reports marked improvement of his swelling. 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 His INR checked today shows excellent control on his current regimen which is actually a fairly high dose of warfarin at an INR of 2.5.  I had the pleasure seeing Mr. Racine back today. He has not followed up since September 2014. His last INR check was September 2015, which is about 6 months ago. He says that with his work he is not able to get regular INR checks, but we discussed at length today the importance of monthly INR monitoring and the dangers of not following his INR closely. His INR was checked in the office and was elevated today at 3.7. He's had high levels in the past is well. We discussed options including switching him to a NOAC  or possibly arranging for him to get blood work near work and send those values in for [place] to adjust. He tells [place] that he is actually going to retire in May and we may be able to get him through until that time. He denies any chest pain or worsening shortness of breath.  PMHx:  Past Medical History  Diagnosis Date  . Hypertension   . Atrial fibrillation     coumadin  . Prostate cancer     radical prostatectomy   . Colon cancer     family hx  . Personal history of colonic polyps   . Atrial fib/flutter, transient     Hx  . Hepatitis C   . Diverticulosis   . Hemorrhoids   . History of DVT (deep vein thrombosis)   . Non-obstructive hypertrophic cardiomyopathy     history of   . History of nuclear stress test 04/2002    exercise; normal study     Past Surgical History  Procedure Laterality Date  . Prostatectomy    . Prostatectomy  04-04    Dr. Risa Grill  . Colonoscopy  [id number],[id number]    [name]  . Cardiac catheterization  2007    no occlusive CAD  . Transthoracic echocardiogram  05/2006    EF ~28%; LV systolic function normal; mild focal basal septal hypertrophy; AV thickness mildly increased; LA mod-markedly dilate    FAMHx:  Family History  Problem Relation Age of  Onset  . Brain cancer Mother     brain tumor  . Stroke Father     SOCHx:   reports that he has never smoked. He has never used smokeless tobacco. He reports that he drinks about 1.0 - 1.5 oz of alcohol per week. He reports that he does not use illicit drugs.  ALLERGIES:  No Known Allergies  ROS: A comprehensive review of systems was negative.  HOME MEDS: Current Outpatient Prescriptions  Medication Sig Dispense Refill  . aspirin 81 MG chewable tablet Chew 81 mg by mouth daily.    Marland Kitchen diltiazem (CARDIZEM CD) 360 MG 24 hr capsule TAKE 1 CAPSULE BY MOUTH DAILY. NEED APPOINTMENT FOR REFILLS 15 capsule 0  . Sildenafil Citrate (VIAGRA PO) Take by mouth as needed.    . warfarin (COUMADIN) 7.5 MG tablet Take 1 to  1.5 tablets daily as directed.  NEED INR FOR FURTHER REFILLS 35 tablet 0   No current facility-administered medications for this visit.    LABS/IMAGING: Results for orders placed or performed in visit on 03/04/15 (from the past 48 hour(s))  POCT INR     Status: None   Collection Time: 03/04/15  4:03 PM  Result Value Ref Range   INR 3.7    No results found.  VITALS: BP 130/80 mmHg  Pulse 101  Ht 6' (1.829 m)  Wt 225 lb 14.4 oz (102.468 kg)  BMI 30.63 kg/m2  EXAM: General appearance: alert and no distress Neck: no adenopathy, no carotid bruit, no JVD, supple, symmetrical, trachea midline and thyroid not enlarged, symmetric, no tenderness/mass/nodules Lungs: clear to auscultation bilaterally Heart: irregularly irregular rhythm Abdomen: soft, non-tender; bowel sounds normal; no masses,  no organomegaly Extremities: extremities normal, atraumatic, no cyanosis or edema Pulses: 2+ and symmetric Skin: Skin color, texture, turgor normal. No rashes or lesions Neurologic: Grossly normal  EKG: Atrial fibrillation at 101  ASSESSMENT: 1. Paroxysmal atrial fibrillation-currently in atrial fibrillation 2. 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 History of DVT 4. History of cancer 5. Hypertension-controlled  PLAN: 1.   Mr. Lehrmann continues to take his medicine, but has been poorly compliant with INR checks. He understands now the danger of this. His INR will be adjusted today and plans to see Kristen back in 2 weeks. We will work out a way for him to get more regular checks over the next few months, which time he says he'll be retired and should be able to see [place] more easily. His A. fib is fairly well rate controlled, and although was elevated initially the heart rate did come down by the end of the visit. We'll continue his current medications. I can see him back annually.  Pixie Casino, MD, [facility] Attending Cardiologist [facility] 03/04/2015, 5:29 PM

## 2015-03-10 ENCOUNTER — Other Ambulatory Visit: Payer: Self-pay | Admitting: Internal Medicine

## 2015-03-17 ENCOUNTER — Other Ambulatory Visit: Payer: Self-pay | Admitting: Internal Medicine

## 2015-03-18 NOTE — Telephone Encounter (Signed)
Rx refill sent to patient pharmacy   

## 2015-03-19 ENCOUNTER — Ambulatory Visit (INDEPENDENT_AMBULATORY_CARE_PROVIDER_SITE_OTHER): Payer: BLUE CROSS/BLUE SHIELD | Admitting: Pharmacist Clinician (PhC)/ Clinical Pharmacy Specialist

## 2015-03-19 DIAGNOSIS — I4891 Unspecified atrial fibrillation: Secondary | ICD-10-CM | POA: Diagnosis not present

## 2015-03-19 DIAGNOSIS — Z7901 Long term (current) use of anticoagulants: Secondary | ICD-10-CM

## 2015-03-19 LAB — POCT INR: INR: 1.6

## 2015-04-09 ENCOUNTER — Ambulatory Visit (INDEPENDENT_AMBULATORY_CARE_PROVIDER_SITE_OTHER): Payer: BLUE CROSS/BLUE SHIELD | Admitting: Pharmacist Clinician (PhC)/ Clinical Pharmacy Specialist

## 2015-04-09 DIAGNOSIS — Z7901 Long term (current) use of anticoagulants: Secondary | ICD-10-CM | POA: Diagnosis not present

## 2015-04-09 DIAGNOSIS — I4891 Unspecified atrial fibrillation: Secondary | ICD-10-CM

## 2015-04-09 LAB — POCT INR: INR: 2.4

## 2015-05-07 ENCOUNTER — Ambulatory Visit (INDEPENDENT_AMBULATORY_CARE_PROVIDER_SITE_OTHER): Payer: BLUE CROSS/BLUE SHIELD | Admitting: Pharmacist Clinician (PhC)/ Clinical Pharmacy Specialist

## 2015-05-07 DIAGNOSIS — I4891 Unspecified atrial fibrillation: Secondary | ICD-10-CM | POA: Diagnosis not present

## 2015-05-07 DIAGNOSIS — Z7901 Long term (current) use of anticoagulants: Secondary | ICD-10-CM

## 2015-05-07 LAB — POCT INR: INR: 1.5

## 2015-05-28 ENCOUNTER — Ambulatory Visit (INDEPENDENT_AMBULATORY_CARE_PROVIDER_SITE_OTHER): Payer: BLUE CROSS/BLUE SHIELD | Admitting: Pharmacist Clinician (PhC)/ Clinical Pharmacy Specialist

## 2015-05-28 DIAGNOSIS — I4891 Unspecified atrial fibrillation: Secondary | ICD-10-CM | POA: Diagnosis not present

## 2015-05-28 DIAGNOSIS — Z7901 Long term (current) use of anticoagulants: Secondary | ICD-10-CM

## 2015-05-28 LAB — POCT INR: INR: 3.8

## 2015-05-30 ENCOUNTER — Other Ambulatory Visit: Payer: Self-pay | Admitting: Internal Medicine

## 2015-06-25 ENCOUNTER — Ambulatory Visit (INDEPENDENT_AMBULATORY_CARE_PROVIDER_SITE_OTHER): Payer: BLUE CROSS/BLUE SHIELD | Admitting: Pharmacist Clinician (PhC)/ Clinical Pharmacy Specialist

## 2015-06-25 DIAGNOSIS — I4891 Unspecified atrial fibrillation: Secondary | ICD-10-CM

## 2015-06-25 DIAGNOSIS — Z7901 Long term (current) use of anticoagulants: Secondary | ICD-10-CM | POA: Diagnosis not present

## 2015-06-25 LAB — POCT INR: INR: 2.5

## 2015-07-23 ENCOUNTER — Ambulatory Visit (INDEPENDENT_AMBULATORY_CARE_PROVIDER_SITE_OTHER): Payer: BLUE CROSS/BLUE SHIELD | Admitting: Pharmacist Clinician (PhC)/ Clinical Pharmacy Specialist

## 2015-07-23 DIAGNOSIS — I4891 Unspecified atrial fibrillation: Secondary | ICD-10-CM

## 2015-07-23 DIAGNOSIS — Z7901 Long term (current) use of anticoagulants: Secondary | ICD-10-CM

## 2015-07-23 LAB — POCT INR: INR: 2.2

## 2015-08-20 ENCOUNTER — Ambulatory Visit (INDEPENDENT_AMBULATORY_CARE_PROVIDER_SITE_OTHER): Payer: BLUE CROSS/BLUE SHIELD | Admitting: Pharmacist Clinician (PhC)/ Clinical Pharmacy Specialist

## 2015-08-20 DIAGNOSIS — Z7901 Long term (current) use of anticoagulants: Secondary | ICD-10-CM

## 2015-08-20 DIAGNOSIS — I4891 Unspecified atrial fibrillation: Secondary | ICD-10-CM

## 2015-08-20 LAB — POCT INR: INR: 1.6

## 2015-09-03 ENCOUNTER — Ambulatory Visit: Payer: BLUE CROSS/BLUE SHIELD | Admitting: Pharmacist Clinician (PhC)/ Clinical Pharmacy Specialist

## 2015-11-12 ENCOUNTER — Telehealth: Payer: Self-pay | Admitting: Pharmacist Clinician (PhC)/ Clinical Pharmacy Specialist

## 2015-11-12 NOTE — Telephone Encounter (Signed)
Closed encounter °

## 2015-11-18 ENCOUNTER — Ambulatory Visit (INDEPENDENT_AMBULATORY_CARE_PROVIDER_SITE_OTHER): Payer: Medicare Other | Admitting: Pharmacist Clinician (PhC)/ Clinical Pharmacy Specialist

## 2015-11-18 DIAGNOSIS — I480 Paroxysmal atrial fibrillation: Secondary | ICD-10-CM | POA: Diagnosis not present

## 2015-11-18 DIAGNOSIS — I4891 Unspecified atrial fibrillation: Secondary | ICD-10-CM

## 2015-11-18 DIAGNOSIS — Z7901 Long term (current) use of anticoagulants: Secondary | ICD-10-CM

## 2015-11-18 LAB — POCT INR: INR: 1.4

## 2015-11-24 ENCOUNTER — Other Ambulatory Visit: Payer: Self-pay | Admitting: Internal Medicine

## 2015-11-27 ENCOUNTER — Ambulatory Visit (INDEPENDENT_AMBULATORY_CARE_PROVIDER_SITE_OTHER): Payer: Medicare Other | Admitting: Pharmacist Clinician (PhC)/ Clinical Pharmacy Specialist

## 2015-11-27 DIAGNOSIS — I4891 Unspecified atrial fibrillation: Secondary | ICD-10-CM

## 2015-11-27 DIAGNOSIS — Z7901 Long term (current) use of anticoagulants: Secondary | ICD-10-CM | POA: Diagnosis not present

## 2015-11-27 LAB — POCT INR: INR: 2.9

## 2015-12-15 ENCOUNTER — Ambulatory Visit (INDEPENDENT_AMBULATORY_CARE_PROVIDER_SITE_OTHER): Payer: Medicare Other | Admitting: Pharmacist Clinician (PhC)/ Clinical Pharmacy Specialist

## 2015-12-15 DIAGNOSIS — I4891 Unspecified atrial fibrillation: Secondary | ICD-10-CM | POA: Diagnosis not present

## 2015-12-15 DIAGNOSIS — Z7901 Long term (current) use of anticoagulants: Secondary | ICD-10-CM

## 2015-12-15 LAB — POCT INR: INR: 1.9

## 2016-01-05 ENCOUNTER — Encounter: Payer: Medicare Other | Admitting: Pharmacist Clinician (PhC)/ Clinical Pharmacy Specialist

## 2016-01-05 ENCOUNTER — Ambulatory Visit (INDEPENDENT_AMBULATORY_CARE_PROVIDER_SITE_OTHER): Payer: Medicare Other | Admitting: Pharmacist Clinician (PhC)/ Clinical Pharmacy Specialist

## 2016-01-05 DIAGNOSIS — I4891 Unspecified atrial fibrillation: Secondary | ICD-10-CM | POA: Diagnosis not present

## 2016-01-05 DIAGNOSIS — Z7901 Long term (current) use of anticoagulants: Secondary | ICD-10-CM | POA: Diagnosis not present

## 2016-01-05 LAB — POCT INR: INR: 1.6

## 2016-01-19 ENCOUNTER — Ambulatory Visit (INDEPENDENT_AMBULATORY_CARE_PROVIDER_SITE_OTHER): Payer: Medicare Other | Admitting: Pharmacist Clinician (PhC)/ Clinical Pharmacy Specialist

## 2016-01-19 DIAGNOSIS — I4891 Unspecified atrial fibrillation: Secondary | ICD-10-CM

## 2016-01-19 DIAGNOSIS — Z7901 Long term (current) use of anticoagulants: Secondary | ICD-10-CM

## 2016-01-19 LAB — POCT INR: INR: 2.5

## 2016-02-16 ENCOUNTER — Ambulatory Visit (INDEPENDENT_AMBULATORY_CARE_PROVIDER_SITE_OTHER): Payer: Medicare Other | Admitting: Pharmacist Clinician (PhC)/ Clinical Pharmacy Specialist

## 2016-02-16 DIAGNOSIS — I4891 Unspecified atrial fibrillation: Secondary | ICD-10-CM | POA: Diagnosis not present

## 2016-02-16 DIAGNOSIS — Z7901 Long term (current) use of anticoagulants: Secondary | ICD-10-CM

## 2016-02-16 LAB — POCT INR: INR: 2.8

## 2016-03-15 ENCOUNTER — Ambulatory Visit (INDEPENDENT_AMBULATORY_CARE_PROVIDER_SITE_OTHER): Payer: Medicare Other | Admitting: Pharmacist Clinician (PhC)/ Clinical Pharmacy Specialist

## 2016-03-15 DIAGNOSIS — I4891 Unspecified atrial fibrillation: Secondary | ICD-10-CM | POA: Diagnosis not present

## 2016-03-15 DIAGNOSIS — Z7901 Long term (current) use of anticoagulants: Secondary | ICD-10-CM | POA: Diagnosis not present

## 2016-03-15 LAB — POCT INR: INR: 2.2

## 2016-04-26 ENCOUNTER — Ambulatory Visit (INDEPENDENT_AMBULATORY_CARE_PROVIDER_SITE_OTHER): Payer: Medicare Other | Admitting: Pharmacist

## 2016-04-26 DIAGNOSIS — I4891 Unspecified atrial fibrillation: Secondary | ICD-10-CM | POA: Diagnosis not present

## 2016-04-26 DIAGNOSIS — Z7901 Long term (current) use of anticoagulants: Secondary | ICD-10-CM | POA: Diagnosis not present

## 2016-04-26 LAB — POCT INR: INR: 2.1

## 2016-05-12 ENCOUNTER — Other Ambulatory Visit: Payer: Self-pay | Admitting: Internal Medicine

## 2016-05-25 ENCOUNTER — Other Ambulatory Visit: Payer: Self-pay | Admitting: Internal Medicine

## 2016-05-28 ENCOUNTER — Other Ambulatory Visit: Payer: Self-pay | Admitting: Internal Medicine

## 2016-06-10 ENCOUNTER — Ambulatory Visit (INDEPENDENT_AMBULATORY_CARE_PROVIDER_SITE_OTHER): Payer: Medicare Other | Admitting: Pharmacist

## 2016-06-10 DIAGNOSIS — I4891 Unspecified atrial fibrillation: Secondary | ICD-10-CM | POA: Diagnosis not present

## 2016-06-10 DIAGNOSIS — Z7901 Long term (current) use of anticoagulants: Secondary | ICD-10-CM

## 2016-06-10 LAB — POCT INR: INR: 2

## 2016-07-22 ENCOUNTER — Ambulatory Visit (INDEPENDENT_AMBULATORY_CARE_PROVIDER_SITE_OTHER): Payer: Medicare Other | Admitting: Pharmacist Clinician (PhC)/ Clinical Pharmacy Specialist

## 2016-07-22 DIAGNOSIS — I4891 Unspecified atrial fibrillation: Secondary | ICD-10-CM | POA: Diagnosis not present

## 2016-07-22 DIAGNOSIS — Z7901 Long term (current) use of anticoagulants: Secondary | ICD-10-CM

## 2016-07-22 LAB — POCT INR: INR: 1.7

## 2016-07-27 ENCOUNTER — Other Ambulatory Visit: Payer: Self-pay | Admitting: Internal Medicine

## 2016-08-05 ENCOUNTER — Ambulatory Visit (INDEPENDENT_AMBULATORY_CARE_PROVIDER_SITE_OTHER): Payer: Medicare Other | Admitting: Pharmacist Clinician (PhC)/ Clinical Pharmacy Specialist

## 2016-08-05 ENCOUNTER — Encounter (INDEPENDENT_AMBULATORY_CARE_PROVIDER_SITE_OTHER): Payer: Self-pay

## 2016-08-05 DIAGNOSIS — I4891 Unspecified atrial fibrillation: Secondary | ICD-10-CM | POA: Diagnosis not present

## 2016-08-05 DIAGNOSIS — Z7901 Long term (current) use of anticoagulants: Secondary | ICD-10-CM

## 2016-08-05 LAB — POCT INR: INR: 2.9

## 2016-08-28 ENCOUNTER — Emergency Department (HOSPITAL_BASED_OUTPATIENT_CLINIC_OR_DEPARTMENT_OTHER)
Admission: EM | Admit: 2016-08-28 | Discharge: 2016-08-28 | Disposition: A | Discharge status: Home / Self Care | Payer: Medicare Other | Attending: Emergency Medicine | Admitting: Emergency Medicine

## 2016-08-28 ENCOUNTER — Encounter (HOSPITAL_BASED_OUTPATIENT_CLINIC_OR_DEPARTMENT_OTHER): Payer: Self-pay

## 2016-08-28 ENCOUNTER — Emergency Department (HOSPITAL_BASED_OUTPATIENT_CLINIC_OR_DEPARTMENT_OTHER): Payer: Medicare Other

## 2016-08-28 DIAGNOSIS — M25551 Pain in right hip: Secondary | ICD-10-CM | POA: Diagnosis not present

## 2016-08-28 DIAGNOSIS — Y929 Unspecified place or not applicable: Secondary | ICD-10-CM | POA: Diagnosis not present

## 2016-08-28 DIAGNOSIS — S39012A Strain of muscle, fascia and tendon of lower back, initial encounter: Secondary | ICD-10-CM | POA: Diagnosis not present

## 2016-08-28 DIAGNOSIS — Z85038 Personal history of other malignant neoplasm of large intestine: Secondary | ICD-10-CM | POA: Insufficient documentation

## 2016-08-28 DIAGNOSIS — Z79899 Other long term (current) drug therapy: Secondary | ICD-10-CM | POA: Diagnosis not present

## 2016-08-28 DIAGNOSIS — Y939 Activity, unspecified: Secondary | ICD-10-CM | POA: Insufficient documentation

## 2016-08-28 DIAGNOSIS — X58XXXA Exposure to other specified factors, initial encounter: Secondary | ICD-10-CM | POA: Insufficient documentation

## 2016-08-28 DIAGNOSIS — Z8546 Personal history of malignant neoplasm of prostate: Secondary | ICD-10-CM | POA: Insufficient documentation

## 2016-08-28 DIAGNOSIS — I10 Essential (primary) hypertension: Secondary | ICD-10-CM | POA: Insufficient documentation

## 2016-08-28 DIAGNOSIS — Z7982 Long term (current) use of aspirin: Secondary | ICD-10-CM | POA: Insufficient documentation

## 2016-08-28 DIAGNOSIS — Y999 Unspecified external cause status: Secondary | ICD-10-CM | POA: Diagnosis not present

## 2016-08-28 DIAGNOSIS — S3992XA Unspecified injury of lower back, initial encounter: Secondary | ICD-10-CM | POA: Diagnosis present

## 2016-08-28 MED ORDER — HYDROCODONE-ACETAMINOPHEN 5-325 MG PO TABS: 1.0000 | ORAL_TABLET | Freq: Four times a day (QID) | ORAL | 0 refills | Status: DC | PRN

## 2016-08-28 NOTE — ED Triage Notes (Signed)
C/o lower back pain x 1 week-denies injury-NAD-steady gait

## 2016-08-28 NOTE — ED Provider Notes (Signed)
[facility] DEPT [facility] Provider Note   CSN: [id number] Arrival date & time: 08/28/16  1541   By signing my name below, I, Royce Macadamia, attest that this documentation has been prepared under the direction and in the presence of  Montine Circle, PA-C. Electronically Signed: Royce Macadamia, ED Scribe. 08/28/16. 5:34 PM.  History   Chief Complaint Chief Complaint  Patient presents with  . Back Pain    HPI Jordan Peercy. is a 70 y.o. male.  The history is provided by the patient. No language interpreter was used.   HPI Comments:  Jordan Veneman. is a 70 y.o. male who presents to the Emergency Department complaining of pain in his lower back that radiates down the front of his right thigh beginning two weeks ago.  Pt recalls his pain began as soreness but grew worse with time.  He also notes slight assocaited weakness in his lower back and right leg and intermittent pain in his right hip.  He has a history of prostate cancer; in 2004 he his prostate removed and states his cancer is in remission.  Pt denies bowel and bladder incontinence and BLE pain or swelling.  He does note he has some chronic bowel and bladder incontinence but has seen no change from his baseline.       Past Medical History:  Diagnosis Date  . Atrial fib/flutter, transient    Hx  . Atrial fibrillation (HCC)    coumadin  . Colon cancer [facility])    family hx  . Diverticulosis   . Hemorrhoids   . Hepatitis C   . History of DVT (deep vein thrombosis)   . History of nuclear stress test 04/2002   exercise; normal study   . Hypertension   . 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[place])    history of   . Personal history of colonic polyps   . Prostate cancer [facility])    radical prostatectomy     Patient Active Problem List   Diagnosis Date Noted  . Atrial fibrillation ([place]) 03/15/2013  . Long term current use of anticoagulant therapy 03/15/2013  . History of prostate cancer 02/16/2013      Past Surgical History:  Procedure Laterality Date  . CARDIAC CATHETERIZATION  2007   no occlusive CAD  . COLONOSCOPY  [id number]   [name]  . PROSTATECTOMY    . PROSTATECTOMY  04-04   Dr. Risa Grill  . TRANSTHORACIC ECHOCARDIOGRAM  05/2006   EF [id number]; LV systolic function normal; mild focal basal septal hypertrophy; AV thickness mildly increased; LA mod-markedly dilate       Home Medications    Prior to Admission medications   Medication Sig Start Date End Date Taking? Authorizing Provider  aspirin 81 MG chewable tablet Chew 81 mg by mouth daily.    Historical Provider, MD  diltiazem (CARDIZEM CD) 360 MG 24 hr capsule TAKE 1 CAPSULE BY MOUTH ONCE A DAY 07/27/16   Pixie Casino, MD  Sildenafil Citrate (VIAGRA PO) Take by mouth as needed.    Historical Provider, MD  warfarin (COUMADIN) 7.5 MG tablet TAKE 1 TO 1 AND 1/2 TABLET BY MOUTH ONCE A DAY AS DIRECTED BY COUMADIN CLINIC 05/13/16   Pixie Casino, MD    Family History Family History  Problem Relation Age of Onset  . Brain cancer Mother     brain tumor  . Stroke Father     Social History Social History  Substance Use Topics  . Smoking status: Never  Smoker  . Smokeless tobacco: Never Used  . Alcohol use Yes     Comment: occasional      Allergies   Review of patient's allergies indicates no known allergies.   Review of Systems Review of Systems  Constitutional: Negative for chills and fever.  Gastrointestinal:       No bowel incontinence  Genitourinary:       No urinary incontinence  Musculoskeletal: Positive for arthralgias, back pain and myalgias.  Neurological:       No saddle anesthesia  All other systems reviewed and are negative.    Physical Exam Updated Vital Signs BP (!) 161/116 (BP Location: Left Arm)   Pulse 62   Temp 98 F (36.7 C) (Oral)   Resp 20   Ht 6' (1.829 m)   Wt 230 lb (104.3 kg)   SpO2 100%   BMI 31.19 kg/m   Physical Exam Physical Exam  Constitutional: Pt appears  well-developed and well-nourished. No distress.  HENT:  Head: Normocephalic and atraumatic.  Mouth/Throat: Oropharynx is clear and moist. No oropharyngeal exudate.  Eyes: Conjunctivae are normal.  Neck: Normal range of motion. Neck supple.  No meningismus Cardiovascular: Normal rate, regular rhythm and intact distal pulses.   Pulmonary/Chest: Effort normal and breath sounds normal. No respiratory distress. Pt has no wheezes.  Abdominal: Pt exhibits no distension Musculoskeletal:  Mild lumbar paraspinal tenderness to palpation, no bony CTLS spine tenderness, deformity, step-off, or crepitus Lymphadenopathy: Pt has no cervical adenopathy.  Neurological: Pt is alert and oriented Speech is clear and goal oriented, follows commands Normal 5/5 strength in upper and lower extremities bilaterally including dorsiflexion and plantar flexion, strong and equal grip strength Sensation intact Great toe extension intact Moves extremities without ataxia, coordination intact Ankle and knee jerk reflexes intact and symmetrical  Normal gait Normal balance No Clonus Skin: Skin is warm and dry. No rash noted. Pt is not diaphoretic. No erythema.  Psychiatric: Pt has a normal mood and affect. Behavior is normal.  Nursing note and vitals reviewed.   ED Treatments / Results   DIAGNOSTIC STUDIES:  Oxygen Saturation is 100% on RA, NML by my interpretation.    COORDINATION OF CARE:  5:34 PM Pt was advised to see an orthapedist.  Discussed treatment plan with pt at bedside and pt agreed to plan.  Labs (all labs ordered are listed, but only abnormal results are displayed) Labs Reviewed - No data to display  EKG  EKG Interpretation None       Radiology No results found.  Procedures Procedures (including critical care time)  Medications Ordered in ED Medications - No data to display   Initial Impression / Assessment and Plan / ED Course  I have reviewed the triage vital signs and the  nursing notes.  Pertinent labs & imaging results that were available during my care of the patient were reviewed by me and considered in my medical decision making (see chart for details).  Clinical Course    Patient with back pain.  No neurological deficits and normal neuro exam.  Patient is ambulatory.  No loss of bowel or bladder control.  Doubt cauda equina.  Denies fever,  doubt epidural abscess or other lesion. Recommend back exercises, stretching, RICE, and will treat with a short course of norco.  Discussed patient with Dr. Tomi Bamberger, who recommends plain films today with more advanced imaging by ortho if plain films are negative.  Plain films consistent with degenerative changes.  Encouraged the patient that there  could be a need for additional workup and/or imaging such as MRI, if the symptoms do not resolve. Patient advised that if the back pain does not resolve, or radiates, this could progress to more serious conditions and is encouraged to follow-up with PCP or orthopedics within 2 weeks.     Final Clinical Impressions(s) / ED Diagnoses   Final diagnoses:  Right hip pain  Low back strain, initial encounter    New Prescriptions Discharge Medication List as of 08/28/2016  6:24 PM    START taking these medications   Details  HYDROcodone-acetaminophen (NORCO/VICODIN) 5-325 MG tablet Take 1-2 tablets by mouth every 6 (six) hours as needed., Starting Sat 08/28/2016, Print       I personally performed the services described in this documentation, which was scribed in my presence. The recorded information has been reviewed and is accurate.      Montine Circle, PA-C 08/28/16 1846    Dorie Rank, MD 08/29/16 1300

## 2016-10-04 ENCOUNTER — Other Ambulatory Visit: Payer: Self-pay | Admitting: Internal Medicine

## 2016-10-27 ENCOUNTER — Other Ambulatory Visit: Payer: Self-pay | Admitting: Internal Medicine

## 2016-11-04 ENCOUNTER — Other Ambulatory Visit: Payer: Self-pay | Admitting: Internal Medicine

## 2016-11-04 NOTE — Telephone Encounter (Signed)
REFILL 

## 2016-11-25 ENCOUNTER — Ambulatory Visit: Payer: Medicare Other | Admitting: Internal Medicine

## 2016-11-26 ENCOUNTER — Ambulatory Visit: Payer: Medicare Other | Admitting: Internal Medicine

## 2016-12-08 ENCOUNTER — Ambulatory Visit (INDEPENDENT_AMBULATORY_CARE_PROVIDER_SITE_OTHER): Payer: Medicare Other | Admitting: Pharmacist

## 2016-12-08 DIAGNOSIS — I4891 Unspecified atrial fibrillation: Secondary | ICD-10-CM | POA: Diagnosis not present

## 2016-12-08 DIAGNOSIS — Z7901 Long term (current) use of anticoagulants: Secondary | ICD-10-CM

## 2016-12-08 LAB — POCT INR: INR: 3.6

## 2016-12-31 ENCOUNTER — Other Ambulatory Visit: Payer: Self-pay | Admitting: Internal Medicine

## 2017-01-03 ENCOUNTER — Telehealth: Payer: Self-pay | Admitting: Internal Medicine

## 2017-01-03 MED ORDER — DILTIAZEM HCL ER COATED BEADS 360 MG PO CP24: ORAL_CAPSULE | ORAL | 0 refills | Status: DC

## 2017-01-03 NOTE — Telephone Encounter (Signed)
Rx(s) sent to pharmacy electronically.  

## 2017-01-03 NOTE — Telephone Encounter (Signed)
New Message     *STAT* If patient is at the pharmacy, call can be transferred to refill team.   1. Which medications need to be refilled? (please list name of each medication and dose if known) diltiazem 360 mg  2. Which pharmacy/location (including street and city if local pharmacy) is medication to be sent to [place]   3. Do they need a 30 day or 90 day supply? 30 day

## 2017-01-07 ENCOUNTER — Ambulatory Visit (INDEPENDENT_AMBULATORY_CARE_PROVIDER_SITE_OTHER): Payer: Medicare Other | Admitting: Internal Medicine

## 2017-01-07 ENCOUNTER — Encounter: Payer: Self-pay | Admitting: Internal Medicine

## 2017-01-07 VITALS — BP 146/100 | HR 119 | Ht 72.0 in | Wt 228.2 lb

## 2017-01-07 DIAGNOSIS — R0600 Dyspnea, unspecified: Secondary | ICD-10-CM | POA: Insufficient documentation

## 2017-01-07 DIAGNOSIS — Z7901 Long term (current) use of anticoagulants: Secondary | ICD-10-CM

## 2017-01-07 DIAGNOSIS — I4891 Unspecified atrial fibrillation: Secondary | ICD-10-CM | POA: Diagnosis not present

## 2017-01-07 DIAGNOSIS — I10 Essential (primary) hypertension: Secondary | ICD-10-CM

## 2017-01-07 MED ORDER — CARVEDILOL 6.25 MG PO TABS: 6.2500 mg | ORAL_TABLET | Freq: Two times a day (BID) | ORAL | 3 refills | Status: DC

## 2017-01-07 MED ORDER — DILTIAZEM HCL ER COATED BEADS 360 MG PO CP24
ORAL_CAPSULE | ORAL | 3 refills | Status: DC
Start: 2017-01-07 — End: 2017-02-15

## 2017-01-07 NOTE — Progress Notes (Signed)
OFFICE NOTE  Chief Complaint:  Routine followup  Primary Care Physician: Wyatt Haste, MD  HPI:  Jordan Grubert. is a 71 year old gentleman with history of paroxysmal A-fib and DVT in the past, as well as hypertension. He has been on Coumadin. He recently had some left lower extremity swelling and underwent Doppler ultrasound. This did not demonstrate any DVT; however, there was no evaluation for superficial reflux. 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 He also has paroxysmal A-fib with no real recurrence recently and he is chronically anticoagulated on Coumadin. His INR had been 3.6. Medication was held and then his INR was then subtherapeutic, restarted and now is at 3.6 today. Unfortunately, I think the dose of Coumadin he is on at 10 mg is just too much for him. He has a history of hypertension which has not been bothersome and he noted that his swelling has come down since he saw our PA about 6 or 7 days ago. He has as a primary care provider who drew some fluid off of his knee. He was fitted with compression stockings and reports marked improvement of his swelling. 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 His INR checked today shows excellent control on his current regimen which is actually a fairly high dose of warfarin at an INR of 2.5.  I had the pleasure seeing Jordan [name] back today. He has not followed up since September 2014. His last INR check was September 2015, which is about 6 months ago. He says that with his work he is not able to get regular INR checks, but we discussed at length today the importance of monthly INR monitoring and the dangers of not following his INR closely. His INR was checked in the office and was elevated today at 3.7. He's had high levels in the past is well. We discussed options including switching him to a NOAC  or possibly arranging for him to get blood work near work and send those values in for [place] to adjust. He tells [place] that he is actually going to retire in May and we may be able to get him through until that time. He denies any chest pain or worsening shortness of breath.  01/07/2017  Jordan [name] returns today for follow-up. His blood pressure is notably elevated 146/100. Heart rate is not controlled at 119. He has been somewhat compliant with his INRs however I did not see him since March 2016. He says that the cost is an issue with follow-up appointments. Despite his elevated heart rate with A. fib he is not had an echocardiogram that I can see in the recent past. He denies any chest pain but does get some shortness of breath with exertion.  PMHx:  Past Medical History:  Diagnosis Date  . Atrial fib/flutter, transient    Hx  . Atrial fibrillation (HCC)    coumadin  . Colon cancer [facility])    family hx  . Diverticulosis   . Hemorrhoids   . Hepatitis C   . History of DVT (deep vein thrombosis)   . History of nuclear stress test 04/2002   exercise; normal study   . Hypertension   . 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 Personal history of colonic polyps   . Prostate cancer [facility])    radical prostatectomy  Past Surgical History:  Procedure Laterality Date  . CARDIAC CATHETERIZATION  2007   no occlusive CAD  . COLONOSCOPY  [id number]   [name]  . PROSTATECTOMY    . PROSTATECTOMY  04-04   Dr. Risa Grill  . TRANSTHORACIC ECHOCARDIOGRAM  05/2006   EF 123XX123; LV systolic function normal; mild focal basal septal hypertrophy; AV thickness mildly increased; LA mod-markedly dilate    FAMHx:  Family History  Problem Relation Age of Onset  . Brain cancer Mother     brain tumor  . Stroke Father     SOCHx:   reports that he has never smoked. He has never used smokeless tobacco. He reports that he drinks alcohol. He reports that he does not use  drugs.  ALLERGIES:  No Known Allergies  ROS: Pertinent items noted in HPI and remainder of comprehensive ROS otherwise negative.  HOME MEDS: Current Outpatient Prescriptions  Medication Sig Dispense Refill  . aspirin 81 MG chewable tablet Chew 81 mg by mouth daily.    Marland Kitchen diltiazem (CARDIZEM CD) 360 MG 24 hr capsule Take 1 capsule (360 mg total) by mouth daily. 90 capsule 3  . HYDROcodone-acetaminophen (NORCO/VICODIN) 5-325 MG tablet Take 1-2 tablets by mouth every 6 (six) hours as needed. 10 tablet 0  . warfarin (COUMADIN) 7.5 MG tablet TAKE 1 TO 1 AND 1/2 TABLET BY MOUTH ONCE A DAY AS DIRECTED BY COUMADIN CLINIC 40 tablet 3  . carvedilol (COREG) 6.25 MG tablet Take 1 tablet (6.25 mg total) by mouth 2 (two) times daily. 180 tablet 3   No current facility-administered medications for this visit.     LABS/IMAGING: No results found for this or any previous visit (from the past 48 hour(s)). No results found.  VITALS: BP (!) 146/100   Pulse (!) 119   Ht 6' (1.829 m)   Wt 228 lb 3.2 oz (103.5 kg)   BMI 30.95 kg/m   EXAM: General appearance: alert and no distress Neck: no adenopathy, no carotid bruit, no JVD, supple, symmetrical, trachea midline and thyroid not enlarged, symmetric, no tenderness/mass/nodules Lungs: clear to auscultation bilaterally Heart: irregularly irregular rhythm Abdomen: soft, non-tender; bowel sounds normal; no masses,  no organomegaly Extremities: extremities normal, atraumatic, no cyanosis or edema Pulses: 2+ and symmetric Skin: Skin color, texture, turgor normal. No rashes or lesions Neurologic: Grossly normal  EKG: Atrial fibrillation with rapid ventricular response at 119  ASSESSMENT: 1. Paroxysmal atrial fibrillation-currently in atrial fibrillation 2. 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 History of DVT 4. History of cancer 5. Hypertension-controlled  PLAN: 1.   Jordan [name] has had some dyspnea on exertion and his  rate control with A. fib is not ideal. He reports compliance on diltiazem but is on the 360 mg dose. I like to add carvedilol 6.25 mg twice daily to his regimen. He should help both with blood pressure and better rate control. We'll get an echocardiogram because of the shortness of breath to rule out any tachycardia mediated cardiomyopathy. He prefers to stay on warfarin which has been on for years as he is concerned about side effects with the novel oral anticoagulants. Follow-up with me afterwards.  Pixie Casino, MD, [facility] Attending Cardiologist [place] 01/07/2017, 1:22 PM

## 2017-01-07 NOTE — Patient Instructions (Addendum)
Your physician has requested that you have an echocardiogram @ [address] - [address]rd [address]. Echocardiography is a painless test that uses sound waves to create images of your heart. It provides your doctor with information about the size and shape of your heart and how well your heart's chambers and valves are working. This procedure takes approximately one hour. There are no restrictions for this procedure.  Your physician has recommended you make the following change in your medication:  -- START carvedilol 6.25mg  twice daily  Your physician recommends that you schedule a follow-up appointment in [place] (after echo)

## 2017-01-17 IMAGING — DX DG HIP (WITH OR WITHOUT PELVIS) 2-3V*R*
3 series · 3 of 3 positions shown · non-contrast
Comparison: None.

CLINICAL DATA: Right hip pain for 1 week.

EXAM:
DG HIP (WITH OR WITHOUT PELVIS) 2-3V RIGHT

[pelvis ap]
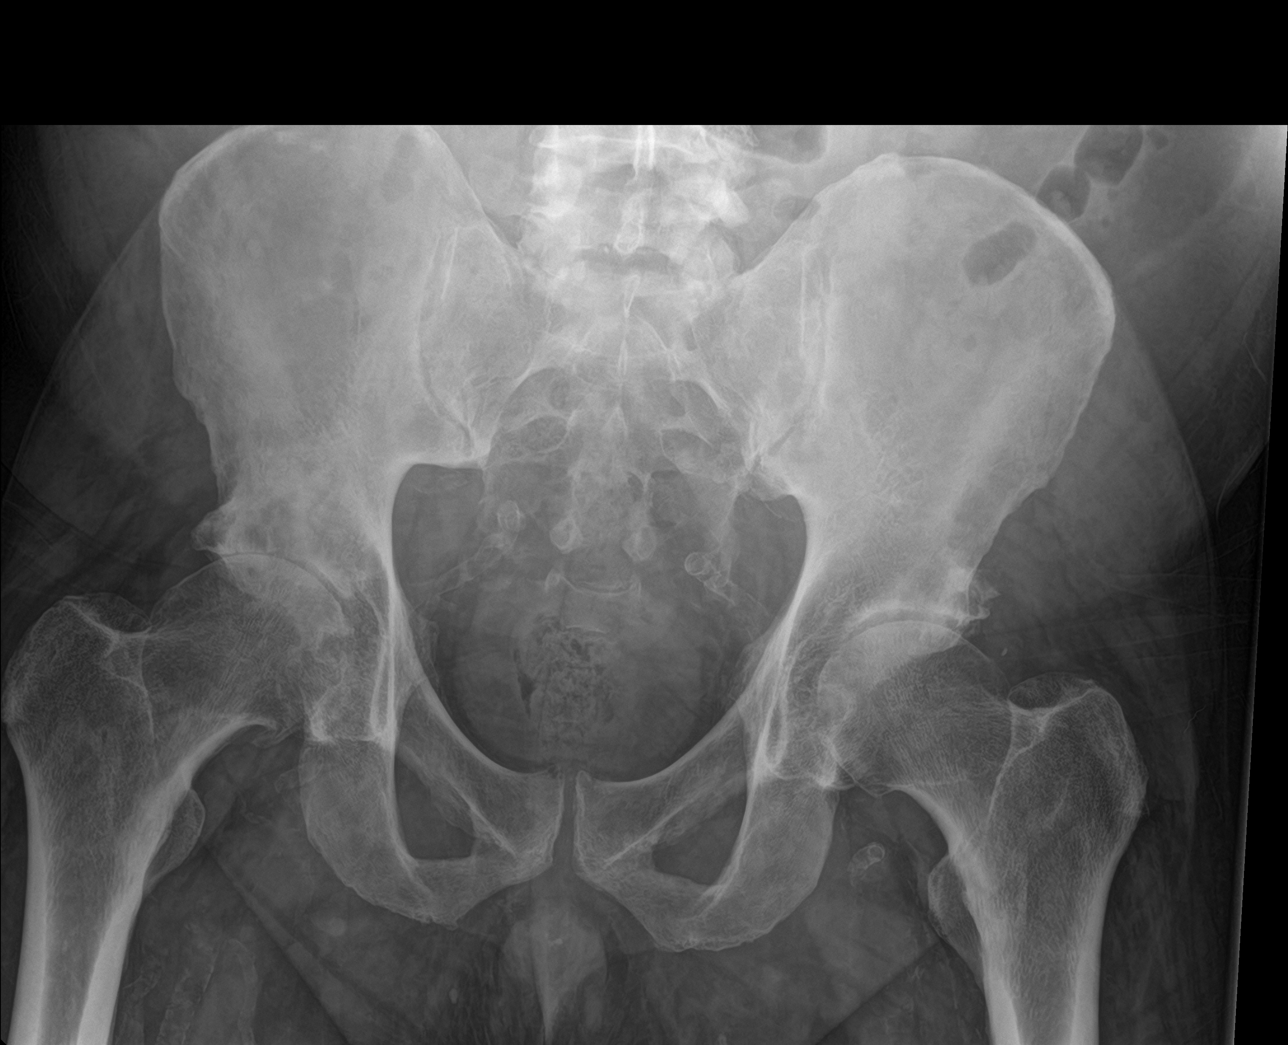

[hip ap]
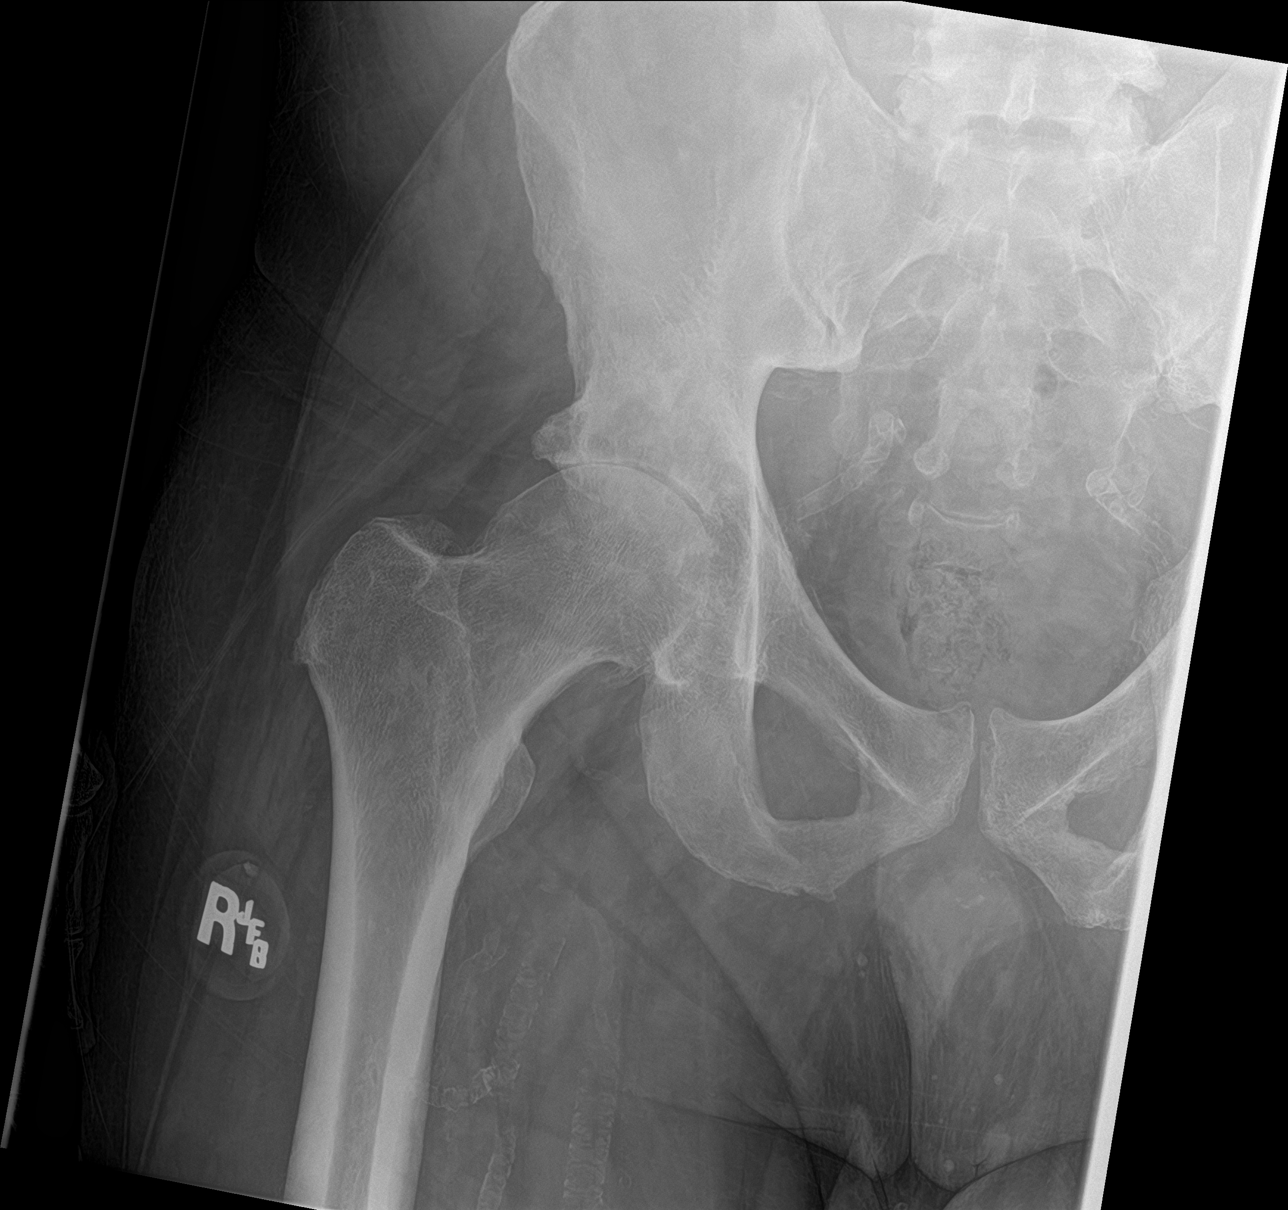

[hip lat]
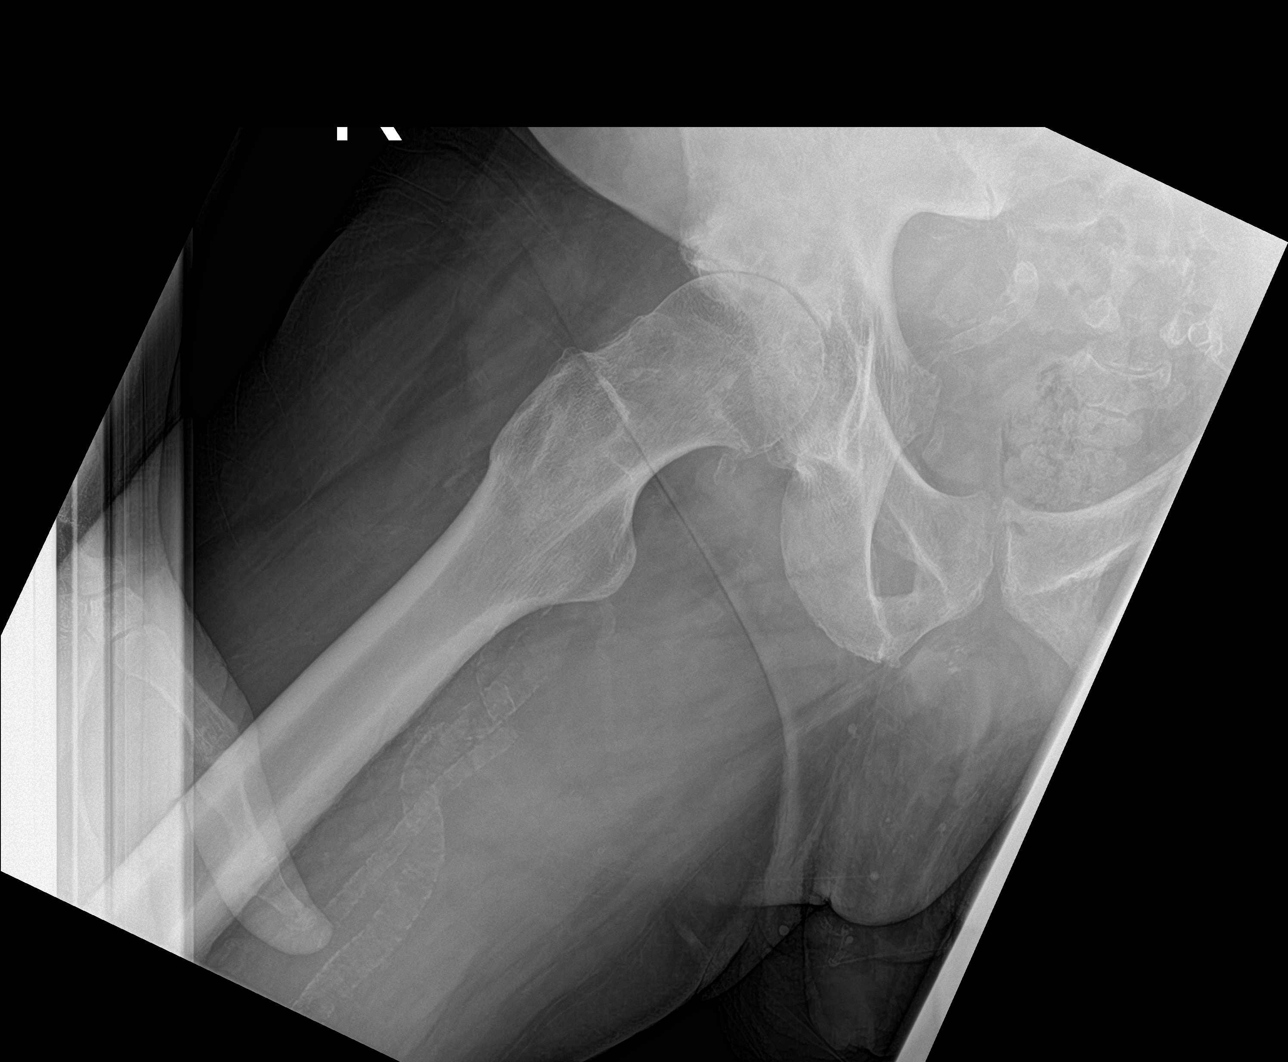

[3 of 3 positions shown; findings below may reference images not displayed]

FINDINGS: There is no evidence of acute fracture, subluxation or dislocation.

Degenerative changes are present within both hips, severe in the
right hip and moderate in the left hip.

No focal bony lesions are identified.
IMPRESSION: No evidence of acute abnormality.

Bilateral hip degenerative changes, severe on the right and moderate
on the left.

## 2017-01-26 ENCOUNTER — Other Ambulatory Visit: Payer: Self-pay

## 2017-01-26 ENCOUNTER — Ambulatory Visit (HOSPITAL_COMMUNITY): Payer: Medicare Other | Attending: Cardiovascular Disease

## 2017-01-26 DIAGNOSIS — R0600 Dyspnea, unspecified: Secondary | ICD-10-CM | POA: Diagnosis not present

## 2017-01-26 DIAGNOSIS — I4891 Unspecified atrial fibrillation: Secondary | ICD-10-CM

## 2017-01-26 DIAGNOSIS — I351 Nonrheumatic aortic (valve) insufficiency: Secondary | ICD-10-CM | POA: Diagnosis not present

## 2017-01-31 ENCOUNTER — Other Ambulatory Visit: Payer: Self-pay | Admitting: Internal Medicine

## 2017-01-31 NOTE — Telephone Encounter (Signed)
INR overdue > 1 month

## 2017-02-15 ENCOUNTER — Ambulatory Visit (INDEPENDENT_AMBULATORY_CARE_PROVIDER_SITE_OTHER): Payer: Medicare Other | Admitting: Pharmacist

## 2017-02-15 ENCOUNTER — Encounter: Payer: Self-pay | Admitting: Internal Medicine

## 2017-02-15 ENCOUNTER — Ambulatory Visit (INDEPENDENT_AMBULATORY_CARE_PROVIDER_SITE_OTHER): Payer: Medicare Other | Admitting: Internal Medicine

## 2017-02-15 VITALS — BP 152/78 | HR 56 | Ht 72.0 in | Wt 230.0 lb

## 2017-02-15 DIAGNOSIS — Z7901 Long term (current) use of anticoagulants: Secondary | ICD-10-CM

## 2017-02-15 DIAGNOSIS — I10 Essential (primary) hypertension: Secondary | ICD-10-CM | POA: Diagnosis not present

## 2017-02-15 DIAGNOSIS — I428 Other cardiomyopathies: Secondary | ICD-10-CM | POA: Insufficient documentation

## 2017-02-15 DIAGNOSIS — I5022 Chronic systolic (congestive) heart failure: Secondary | ICD-10-CM

## 2017-02-15 DIAGNOSIS — I4891 Unspecified atrial fibrillation: Secondary | ICD-10-CM

## 2017-02-15 DIAGNOSIS — I481 Persistent atrial fibrillation: Secondary | ICD-10-CM | POA: Diagnosis not present

## 2017-02-15 DIAGNOSIS — I4819 Other persistent atrial fibrillation: Secondary | ICD-10-CM

## 2017-02-15 LAB — POCT INR: INR: 4.9

## 2017-02-15 MED ORDER — CARVEDILOL 12.5 MG PO TABS: 12.5000 mg | ORAL_TABLET | Freq: Two times a day (BID) | ORAL | 3 refills | Status: DC

## 2017-02-15 MED ORDER — DILTIAZEM HCL ER COATED BEADS 240 MG PO CP24: ORAL_CAPSULE | ORAL | 3 refills | Status: DC

## 2017-02-15 NOTE — Patient Instructions (Signed)
Medication Instructions:  INCREASE Coreg to 12.5mg  two times daily DECREASE diltiazem to 240mg  one time daily.    Labwork: NONE  Testing/Procedures: NONE  Follow-Up: Your physician recommends that you schedule a follow-up appointment in: 2 WEEKS with PHARMACY FOR BP CHECK check and in 3 MONTHS with DR. HILTY.   Any Other Special Instructions Will Be Listed Below (If Applicable).     If you need a refill on your cardiac medications before your next appointment, please call your pharmacy.

## 2017-02-15 NOTE — Progress Notes (Signed)
OFFICE NOTE  Chief Complaint:  Routine followup  Primary Care Physician: Wyatt Haste, MD  HPI:  Jordan View Buggy. is a 70 year old gentleman with history of paroxysmal A-fib and DVT in the past, as well as hypertension. He has been on Coumadin. He recently had some left lower extremity swelling and underwent Doppler ultrasound. This did not demonstrate any DVT; however, there was no evaluation for superficial reflux. 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 He also has paroxysmal A-fib with no real recurrence recently and he is chronically anticoagulated on Coumadin. His INR had been 3.6. Medication was held and then his INR was then subtherapeutic, restarted and now is at 3.6 today. Unfortunately, I think the dose of Coumadin he is on at 10 mg is just too much for him. He has a history of hypertension which has not been bothersome and he noted that his swelling has come down since he saw our PA about 6 or 7 days ago. He has as a primary care provider who drew some fluid off of his knee. He was fitted with compression stockings and reports marked improvement of his swelling. 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 His INR checked today shows excellent control on his current regimen which is actually a fairly high dose of warfarin at an INR of 2.5.  I had the pleasure seeing Jordan [name] back today. He has not followed up since September 2014. His last INR check was September 2015, which is about 6 months ago. He says that with his work he is not able to get regular INR checks, but we discussed at length today the importance of monthly INR monitoring and the dangers of not following his INR closely. His INR was checked in the office and was elevated today at 3.7. He's had high levels in the past is well. We discussed options including switching him to a NOAC  or possibly arranging for him to get blood work near work and send those values in for [place] to adjust. He tells [place] that he is actually going to retire in May and we may be able to get him through until that time. He denies any chest pain or worsening shortness of breath.  01/07/2017  Jordan [name] returns today for follow-up. His blood pressure is notably elevated 146/100. Heart rate is not controlled at 119. He has been somewhat compliant with his INRs however I did not see him since March 2016. He says that the cost is an issue with follow-up appointments. Despite his elevated heart rate with A. fib he is not had an echocardiogram that I can see in the recent past. He denies any chest pain but does get some shortness of breath with exertion.  02/15/2017  Jordan [name] returns back today for follow-up. Heart rate is now better controlled at 56 however he remains in atrial fibrillation. He underwent an echocardiogram does show a mildly reduced LV EF of 45-50% and severe left atrial enlargement. I reviewed a number of his EKGs from the past indicating he's probably been in persistent or long-standing persistent atrial fibrillation since 2014. Based on this, and given his severe lower atrial enlargement, it is unlikely for [place] to reestablish sinus rhythm unless we use antiarrhythmic medication. He seems to be unaware of his A. fib and has NYHA class 1-2 heart failure symptoms. I would recommend  further medical therapy for congestive heart failure at this time.  PMHx:  Past Medical History:  Diagnosis Date  . Atrial fib/flutter, transient    Hx  . Atrial fibrillation (HCC)    coumadin  . Colon cancer [facility])    family hx  . Diverticulosis   . Hemorrhoids   . Hepatitis C   . History of DVT (deep vein thrombosis)   . History of nuclear stress test 04/2002   exercise; normal study   . Hypertension   . 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 Personal history of colonic polyps     . Prostate cancer [facility])    radical prostatectomy     Past Surgical History:  Procedure Laterality Date  . CARDIAC CATHETERIZATION  2007   no occlusive CAD  . COLONOSCOPY  [id number]   [name]  . PROSTATECTOMY    . PROSTATECTOMY  04-04   Dr. Risa Grill  . TRANSTHORACIC ECHOCARDIOGRAM  05/2006   EF [id number]; LV systolic function normal; mild focal basal septal hypertrophy; AV thickness mildly increased; LA mod-markedly dilate    FAMHx:  Family History  Problem Relation Age of Onset  . Brain cancer Mother     brain tumor  . Stroke Father     SOCHx:   reports that he has never smoked. He has never used smokeless tobacco. He reports that he drinks alcohol. He reports that he does not use drugs.  ALLERGIES:  No Known Allergies  ROS: Pertinent items noted in HPI and remainder of comprehensive ROS otherwise negative.  HOME MEDS: Current Outpatient Prescriptions  Medication Sig Dispense Refill  . aspirin 81 MG chewable tablet Chew 81 mg by mouth daily.    . carvedilol (COREG) 12.5 MG tablet Take 1 tablet (12.5 mg total) by mouth 2 (two) times daily. 60 tablet 3  . diltiazem (CARDIZEM CD) 240 MG 24 hr capsule Take 1 capsule (240 mg total) by mouth daily. 30 capsule 3  . warfarin (COUMADIN) 7.5 MG tablet TAKE 1 TO 1-1/2 TABLETS BY MOUTH ONCE A DAY AS DIRECTED BY COUMADIN CLINIC 20 tablet 0   No current facility-administered medications for this visit.     LABS/IMAGING: Results for orders placed or performed in visit on 02/15/17 (from the past 48 hour(s))  POCT INR     Status: None   Collection Time: 02/15/17  9:50 AM  Result Value Ref Range   INR 4.9    No results found.  VITALS: BP (!) 152/78   Pulse (!) 56   Ht 6' (1.829 m)   Wt 230 lb (104.3 kg)   SpO2 99%   BMI 31.19 kg/m   EXAM: General appearance: alert and no distress Neck: no adenopathy, no carotid bruit, no JVD, supple, symmetrical, trachea midline and thyroid not enlarged, symmetric, no  tenderness/mass/nodules Lungs: clear to auscultation bilaterally Heart: irregularly irregular rhythm Abdomen: soft, non-tender; bowel sounds normal; no masses,  no organomegaly Extremities: extremities normal, atraumatic, no cyanosis or edema Pulses: 2+ and symmetric Skin: Skin color, texture, turgor normal. No rashes or lesions Neurologic: Grossly normal  EKG: Fibrillation with PVCs at 72  ASSESSMENT: 1. Long-standing persistent atrial fibrillation, CHADSVASC score of 4 on warfarin by his preference 2. 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 History of DVT 5. History of cancer 6. Hypertension-controlled  PLAN: 1.   Jordan [name] has a mild cardio myopathy with EF 45-50% and  severe left atrial enlargement. This could be related to recent A. fib with RVR. He has a history of A. fib and probably is long-standing persistent. I recently added carvedilol 6.25 mg to his regimen. I like to increase that to 12.5 mg twice daily and decrease diltiazem to 240 mg daily. He will need a blood pressure recheck in a few weeks in the hypertension clinic. Finally he is warfarin was assessed today and INR is therapeutic.  Follow-up with me in 3-6 months.  Pixie Casino, MD, [facility] Attending Cardiologist [place] 02/15/2017, 5:47 PM

## 2017-02-17 NOTE — Addendum Note (Signed)
Addended by: Zebedee Iba on: 02/17/2017 09:23 AM   Modules accepted: Orders

## 2017-02-25 ENCOUNTER — Other Ambulatory Visit: Payer: Self-pay | Admitting: Internal Medicine

## 2017-03-02 ENCOUNTER — Ambulatory Visit: Payer: Medicare Other

## 2017-03-02 NOTE — Progress Notes (Deleted)
Patient ID: Jordan [name].                 DOB: Oct 21, 1946                      MRN: [id number]     HPI: Jordan [name] is a 71 y.o. male referred by Dr. Debara Pickett to HTN clinic.  PMI includes A-fib, DVT and hypertension.   Current HTN meds:  diltiazem 360mg  daily Carvedilol 12.5 mg twice daily  BP goal: <130/80  Family History:   Social History:   Diet:   Exercise:   Home BP readings:   Wt Readings from Last 3 Encounters:  02/15/17 230 lb (104.3 kg)  01/07/17 228 lb 3.2 oz (103.5 kg)  08/28/16 230 lb (104.3 kg)   BP Readings from Last 3 Encounters:  02/15/17 (!) 152/78  01/07/17 (!) 146/100  08/28/16 120/65   Pulse Readings from Last 3 Encounters:  02/15/17 (!) 56  01/07/17 (!) 119  08/28/16 88    Renal function: CrCl cannot be calculated (Patient's most recent lab result is older than the maximum 21 days allowed.).  Past Medical History:  Diagnosis Date  . Atrial fib/flutter, transient    Hx  . Atrial fibrillation (HCC)    coumadin  . Colon cancer [facility])    family hx  . Diverticulosis   . Hemorrhoids   . Hepatitis C   . History of DVT (deep vein thrombosis)   . History of nuclear stress test 04/2002   exercise; normal study   . Hypertension   . 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 Personal history of colonic polyps   . Prostate cancer [facility])    radical prostatectomy     Current Outpatient Prescriptions on File Prior to Visit  Medication Sig Dispense Refill  . aspirin 81 MG chewable tablet Chew 81 mg by mouth daily.    . carvedilol (COREG) 12.5 MG tablet Take 1 tablet (12.5 mg total) by mouth 2 (two) times daily. 60 tablet 3  . diltiazem (CARDIZEM CD) 240 MG 24 hr capsule Take 1 capsule (240 mg total) by mouth daily. 30 capsule 3  . warfarin (COUMADIN) 7.5 MG tablet TAKE 1 TO 1.5 TABLETS BY MOUTH EVERY DAY AS DIRECTED BY COUMADIN CLINIC 40 tablet 0   No current facility-administered medications on file prior  to visit.     No Known Allergies  There were no vitals taken for this visit.  No problem-specific Assessment & Plan notes found for this encounter.  Terren Haberle Rodriguez-Guzman PharmD, [place] [id number] 03/02/2017 7:40 AM

## 2017-03-03 ENCOUNTER — Ambulatory Visit: Payer: Medicare Other

## 2017-03-31 ENCOUNTER — Other Ambulatory Visit: Payer: Self-pay | Admitting: Internal Medicine

## 2017-03-31 NOTE — Telephone Encounter (Signed)
INR check overdue by 1 month.   LMOM ; patient to call back and schedule appointment for INR check before next refill authorization.

## 2017-04-11 ENCOUNTER — Emergency Department (HOSPITAL_BASED_OUTPATIENT_CLINIC_OR_DEPARTMENT_OTHER)
Admission: EM | Admit: 2017-04-11 | Discharge: 2017-04-11 | Disposition: A | Discharge status: Home / Self Care | Payer: Medicare Other | Attending: Emergency Medicine | Admitting: Emergency Medicine

## 2017-04-11 ENCOUNTER — Emergency Department (HOSPITAL_BASED_OUTPATIENT_CLINIC_OR_DEPARTMENT_OTHER): Payer: Medicare Other

## 2017-04-11 ENCOUNTER — Encounter (HOSPITAL_BASED_OUTPATIENT_CLINIC_OR_DEPARTMENT_OTHER): Payer: Self-pay | Admitting: *Deleted

## 2017-04-11 DIAGNOSIS — I5022 Chronic systolic (congestive) heart failure: Secondary | ICD-10-CM | POA: Insufficient documentation

## 2017-04-11 DIAGNOSIS — Y939 Activity, unspecified: Secondary | ICD-10-CM | POA: Diagnosis not present

## 2017-04-11 DIAGNOSIS — S99922A Unspecified injury of left foot, initial encounter: Secondary | ICD-10-CM | POA: Diagnosis present

## 2017-04-11 DIAGNOSIS — Z8546 Personal history of malignant neoplasm of prostate: Secondary | ICD-10-CM | POA: Insufficient documentation

## 2017-04-11 DIAGNOSIS — W228XXA Striking against or struck by other objects, initial encounter: Secondary | ICD-10-CM | POA: Diagnosis not present

## 2017-04-11 DIAGNOSIS — Z79899 Other long term (current) drug therapy: Secondary | ICD-10-CM | POA: Diagnosis not present

## 2017-04-11 DIAGNOSIS — Y999 Unspecified external cause status: Secondary | ICD-10-CM | POA: Insufficient documentation

## 2017-04-11 DIAGNOSIS — Y929 Unspecified place or not applicable: Secondary | ICD-10-CM | POA: Diagnosis not present

## 2017-04-11 DIAGNOSIS — Z85038 Personal history of other malignant neoplasm of large intestine: Secondary | ICD-10-CM | POA: Diagnosis not present

## 2017-04-11 DIAGNOSIS — Z7982 Long term (current) use of aspirin: Secondary | ICD-10-CM | POA: Insufficient documentation

## 2017-04-11 DIAGNOSIS — M79672 Pain in left foot: Secondary | ICD-10-CM

## 2017-04-11 DIAGNOSIS — Z7901 Long term (current) use of anticoagulants: Secondary | ICD-10-CM | POA: Diagnosis not present

## 2017-04-11 DIAGNOSIS — M7989 Other specified soft tissue disorders: Secondary | ICD-10-CM | POA: Insufficient documentation

## 2017-04-11 DIAGNOSIS — I110 Hypertensive heart disease with heart failure: Secondary | ICD-10-CM | POA: Insufficient documentation

## 2017-04-11 NOTE — ED Triage Notes (Signed)
Pt c/o foot/left big toe injury x 4 days ago

## 2017-04-11 NOTE — Discharge Instructions (Signed)
Rest - please stay off foot as much as possible Ice - ice for 20 minutes at a time, several times a day Elevate - elevate foot above level of heart to reduce swelling Naproxen (Aleve) - take with food. Take up to 500mg  two times daily

## 2017-04-11 NOTE — ED Provider Notes (Signed)
[facility] Provider Note   CSN: [id number] Arrival date & time: 04/11/17  1351   By signing my name below, I, Charolotte Eke, attest that this documentation has been prepared under the direction and in the presence of Janetta Hora, PA-C. Electronically Signed: Charolotte Eke, Scribe. 04/11/17. 4:39 PM.    History   Chief Complaint Chief Complaint  Patient presents with  . Foot Pain    HPI Jordan [name]. is a 71 y.o. male with h/o DVT, HTN, prostate CA who presents to the Emergency Department complaining of suddent onset, worsening, left foot pain s/p hitting his big toe on something days ago. Pt states that he put a shoe on his foot the next day and then it began to swell. Pt states that the pain cannot move his toe, and that the pain is not exacerbated by movement. He says at first it was throbbing, but that it was slowly resolving. Pt states that the swelling in his foot and ankle is worsening. Pt has no other associated symptoms. Pt states that he does not sleep sitting up. Pt has taken Aleve PTA with slight relief x1 per day.   The history is provided by the patient. No language interpreter was used.    Past Medical History:  Diagnosis Date  . Atrial fib/flutter, transient    Hx  . Atrial fibrillation (HCC)    coumadin  . Colon cancer [facility])    family hx  . Diverticulosis   . Hemorrhoids   . Hepatitis C   . History of DVT (deep vein thrombosis)   . History of nuclear stress test 04/2002   exercise; normal study   . Hypertension   . 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 Personal history of colonic polyps   . Prostate cancer [facility])    radical prostatectomy     Patient Active Problem List   Diagnosis Date Noted  . NICM (nonischemic cardiomyopathy) ([place]) 02/15/2017  . Chronic systolic heart failure ([place]) 02/15/2017  . Dyspnea 01/07/2017  . Essential hypertension 01/07/2017  . Atrial fibrillation ([place]) 03/15/2013  . Long term  current use of anticoagulant therapy 03/15/2013  . History of prostate cancer 02/16/2013    Past Surgical History:  Procedure Laterality Date  . CARDIAC CATHETERIZATION  2007   no occlusive CAD  . COLONOSCOPY  3474,2595   [name]  . PROSTATECTOMY    . PROSTATECTOMY  04-04   Dr. Risa Grill  . TRANSTHORACIC ECHOCARDIOGRAM  05/2006   EF ~63%; LV systolic function normal; mild focal basal septal hypertrophy; AV thickness mildly increased; LA mod-markedly dilate       Home Medications    Prior to Admission medications   Medication Sig Start Date End Date Taking? Authorizing Provider  aspirin 81 MG chewable tablet Chew 81 mg by mouth daily.    Historical Provider, MD  carvedilol (COREG) 12.5 MG tablet Take 1 tablet (12.5 mg total) by mouth 2 (two) times daily. 02/15/17   Pixie Casino, MD  diltiazem (CARDIZEM CD) 240 MG 24 hr capsule Take 1 capsule (240 mg total) by mouth daily. 02/15/17   Pixie Casino, MD  warfarin (COUMADIN) 7.5 MG tablet TAKE 1 TO 1 AND 1/2 TABLETS BY MOUTH EVERY DAY AS DIRECTED BY COUMADIN CLINIC 03/31/17   Pixie Casino, MD    Family History Family History  Problem Relation Age of Onset  . Brain cancer Mother     brain tumor  .  Stroke Father     Social History Social History  Substance Use Topics  . Smoking status: Never Smoker  . Smokeless tobacco: Never Used  . Alcohol use Yes     Comment: occasional      Allergies   Patient has no known allergies.   Review of Systems Review of Systems  Musculoskeletal: Positive for joint swelling and myalgias.  Skin: Negative for wound.     Physical Exam Updated Vital Signs BP (!) 136/93 (BP Location: Right Arm)   Pulse 63   Temp 98.3 F (36.8 C) (Oral) Comment: nurse first vs recheck per protocol  Resp 18   Ht 6' (1.829 m)   Wt 225 lb (102.1 kg)   SpO2 99%   BMI 30.52 kg/m   Physical Exam  Constitutional: He is oriented to person, place, and time. He appears well-developed and  well-nourished.  HENT:  Head: Normocephalic and atraumatic.  Cardiovascular: Normal rate.   Pulmonary/Chest: Effort normal.  Musculoskeletal:  Mild swelling of the ankle and dorsal aspect of the left foot. Mild tenderness to the left big toe. No redness. Neurovascularly intact.  Neurological: He is alert and oriented to person, place, and time.  Skin: Skin is warm and dry.  Psychiatric: He has a normal mood and affect.  Nursing note and vitals reviewed.    ED Treatments / Results   DIAGNOSTIC STUDIES: Oxygen Saturation is 99% on room air, normal by my interpretation.    COORDINATION OF CARE: 4:35 PM Discussed treatment plan with pt at bedside and pt agreed to plan, which includes elevation to lessen the swelling, ice, heat, NSAIDs. If the foot/toe does not get better, he needs to follow up with his PCP.    Labs (all labs ordered are listed, but only abnormal results are displayed) Labs Reviewed - No data to display  EKG  EKG Interpretation None       Radiology Dg Foot Complete Left  Result Date: 04/11/2017 CLINICAL DATA:  Left foot pain secondary to blunt trauma 1 week ago. EXAM: LEFT FOOT - COMPLETE 3+ VIEW COMPARISON:  None. FINDINGS: There is no evidence of fracture or dislocation. Moderate osteoarthritis of the first MTP joint. Slight arthritis of the IP joints of the first and second toes. Extensive arterial vascular calcifications in the foot and ankle. IMPRESSION: No acute abnormality.  Arthritic changes as described. Electronically Signed   By: Lorriane Shire M.D.   On: 04/11/2017 14:26    Procedures Procedures (including critical care time)  Medications Ordered in ED Medications - No data to display   Initial Impression / Assessment and Plan / ED Course   I have reviewed the triage vital signs and the nursing notes.  Pertinent labs & imaging results that were available during my care of the patient were reviewed by me and considered in my medical decision  making (see chart for details).  Patient X-Ray negative for obvious fracture or dislocation. Pt advised to follow up with PCP. Conservative therapy recommended and discussed. Patient will be discharged home & is agreeable with above plan. Returns precautions discussed. Pt appears safe for discharge.  Final Clinical Impressions(s) / ED Diagnoses   Final diagnoses:  Left foot pain    New Prescriptions New Prescriptions   No medications on file   I personally performed the services described in this documentation, which was scribed in my presence. The recorded information has been reviewed and is accurate.     Recardo Evangelist, PA-C 04/12/17 [phone]  Veryl Speak, MD 04/12/17 2134

## 2017-04-11 NOTE — ED Notes (Signed)
Pt verbalizes understanding to f/u with cardiology about BP

## 2017-04-15 ENCOUNTER — Ambulatory Visit (INDEPENDENT_AMBULATORY_CARE_PROVIDER_SITE_OTHER): Payer: Medicare Other | Admitting: Pharmacist Clinician (PhC)/ Clinical Pharmacy Specialist

## 2017-04-15 DIAGNOSIS — I4819 Other persistent atrial fibrillation: Secondary | ICD-10-CM

## 2017-04-15 DIAGNOSIS — I481 Persistent atrial fibrillation: Secondary | ICD-10-CM

## 2017-04-15 DIAGNOSIS — I4891 Unspecified atrial fibrillation: Secondary | ICD-10-CM

## 2017-04-15 DIAGNOSIS — Z7901 Long term (current) use of anticoagulants: Secondary | ICD-10-CM

## 2017-04-15 LAB — POCT INR: INR: 3.5

## 2017-04-29 ENCOUNTER — Ambulatory Visit (INDEPENDENT_AMBULATORY_CARE_PROVIDER_SITE_OTHER): Payer: Medicare Other | Admitting: Pharmacist

## 2017-04-29 DIAGNOSIS — Z7901 Long term (current) use of anticoagulants: Secondary | ICD-10-CM

## 2017-04-29 DIAGNOSIS — I4891 Unspecified atrial fibrillation: Secondary | ICD-10-CM

## 2017-04-29 LAB — POCT INR: INR: 3.2

## 2017-05-13 ENCOUNTER — Encounter: Payer: Self-pay | Admitting: Internal Medicine

## 2017-05-13 ENCOUNTER — Ambulatory Visit (INDEPENDENT_AMBULATORY_CARE_PROVIDER_SITE_OTHER): Payer: Medicare Other | Admitting: Pharmacist

## 2017-05-13 ENCOUNTER — Ambulatory Visit (INDEPENDENT_AMBULATORY_CARE_PROVIDER_SITE_OTHER): Payer: Medicare Other | Admitting: Internal Medicine

## 2017-05-13 VITALS — BP 154/105 | HR 116 | Ht 72.0 in | Wt 229.2 lb

## 2017-05-13 DIAGNOSIS — I481 Persistent atrial fibrillation: Secondary | ICD-10-CM | POA: Diagnosis not present

## 2017-05-13 DIAGNOSIS — I4891 Unspecified atrial fibrillation: Secondary | ICD-10-CM | POA: Diagnosis not present

## 2017-05-13 DIAGNOSIS — I5022 Chronic systolic (congestive) heart failure: Secondary | ICD-10-CM | POA: Diagnosis not present

## 2017-05-13 DIAGNOSIS — I4819 Other persistent atrial fibrillation: Secondary | ICD-10-CM

## 2017-05-13 DIAGNOSIS — I428 Other cardiomyopathies: Secondary | ICD-10-CM | POA: Diagnosis not present

## 2017-05-13 DIAGNOSIS — Z7901 Long term (current) use of anticoagulants: Secondary | ICD-10-CM

## 2017-05-13 DIAGNOSIS — R0600 Dyspnea, unspecified: Secondary | ICD-10-CM

## 2017-05-13 LAB — POCT INR: INR: 2.4

## 2017-05-13 MED ORDER — WARFARIN SODIUM 7.5 MG PO TABS: ORAL_TABLET | ORAL | 5 refills | Status: DC

## 2017-05-13 NOTE — Progress Notes (Signed)
OFFICE NOTE  Chief Complaint:  No complaints  Primary Care Physician: Denita Lung, MD  HPI:  Jordan Rachal. is a 71 year old gentleman with history of paroxysmal A-fib and DVT in the past, as well as hypertension. He has been on Coumadin. He recently had some left lower extremity swelling and underwent Doppler ultrasound. This did not demonstrate any DVT; however, there was no evaluation for superficial reflux. 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 He also has paroxysmal A-fib with no real recurrence recently and he is chronically anticoagulated on Coumadin. His INR had been 3.6. Medication was held and then his INR was then subtherapeutic, restarted and now is at 3.6 today. Unfortunately, I think the dose of Coumadin he is on at 10 mg is just too much for him. He has a history of hypertension which has not been bothersome and he noted that his swelling has come down since he saw our PA about 6 or 7 days ago. He has as a primary care provider who drew some fluid off of his knee. He was fitted with compression stockings and reports marked improvement of his swelling. 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 His INR checked today shows excellent control on his current regimen which is actually a fairly high dose of warfarin at an INR of 2.5.  I had the pleasure seeing Jordan [name] back today. He has not followed up since September 2014. His last INR check was September 2015, which is about 6 months ago. He says that with his work he is not able to get regular INR checks, but we discussed at length today the importance of monthly INR monitoring and the dangers of not following his INR closely. His INR was checked in the office and was elevated today at 3.7. He's had high levels in the past is well. We discussed options including switching him to a NOAC or  possibly arranging for him to get blood work near work and send those values in for [place] to adjust. He tells [place] that he is actually going to retire in May and we may be able to get him through until that time. He denies any chest pain or worsening shortness of breath.  01/07/2017  Jordan [name] returns today for follow-up. His blood pressure is notably elevated 146/100. Heart rate is not controlled at 119. He has been somewhat compliant with his INRs however I did not see him since March 2016. He says that the cost is an issue with follow-up appointments. Despite his elevated heart rate with A. fib he is not had an echocardiogram that I can see in the recent past. He denies any chest pain but does get some shortness of breath with exertion.  02/15/2017  Jordan [name] returns back today for follow-up. Heart rate is now better controlled at 56 however he remains in atrial fibrillation. He underwent an echocardiogram does show a mildly reduced LV EF of 45-50% and severe left atrial enlargement. I reviewed a number of his EKGs from the past indicating he's probably been in persistent or long-standing persistent atrial fibrillation since 2014. Based on this, and given his severe lower atrial enlargement, it is unlikely for [place] to reestablish sinus rhythm unless we use antiarrhythmic medication. He seems to be unaware of his A. fib and has NYHA class 1-2 heart failure symptoms. I would  recommend further medical therapy for congestive heart failure at this time.  5/18/018  Jordan [name] returns for follow-up today. He is without complaints. He remains in A. fib and heart rate is elevated today 116. He says he's not very compliant with twice-daily carvedilol dosing but is willing to work on it. Blood pressure is also elevated today to recheck was not much improved. His INR was checked in warfarin was adjusted by pharmacy. She denies any chest pain or shortness of breath.  PMHx:  Past Medical History:  Diagnosis Date   . Atrial fib/flutter, transient    Hx  . Atrial fibrillation (HCC)    coumadin  . Colon cancer [facility])    family hx  . Diverticulosis   . Hemorrhoids   . Hepatitis C   . History of DVT (deep vein thrombosis)   . History of nuclear stress test 04/2002   exercise; normal study   . Hypertension   . 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 Personal history of colonic polyps   . Prostate cancer [facility])    radical prostatectomy     Past Surgical History:  Procedure Laterality Date  . CARDIAC CATHETERIZATION  2007   no occlusive CAD  . COLONOSCOPY  [id number]   Jordan [name]  . PROSTATECTOMY    . PROSTATECTOMY  04-04   Dr. Risa Grill  . TRANSTHORACIC ECHOCARDIOGRAM  05/2006   EF ~23%; LV systolic function normal; mild focal basal septal hypertrophy; AV thickness mildly increased; LA mod-markedly dilate    FAMHx:  Family History  Problem Relation Age of Onset  . Brain cancer Mother        brain tumor  . Stroke Father     SOCHx:   reports that he has never smoked. He has never used smokeless tobacco. He reports that he drinks alcohol. He reports that he does not use drugs.  ALLERGIES:  No Known Allergies  ROS: A comprehensive review of systems was negative.  HOME MEDS: Current Outpatient Prescriptions  Medication Sig Dispense Refill  . aspirin 81 MG chewable tablet Chew 81 mg by mouth daily.    . carvedilol (COREG) 12.5 MG tablet Take 1 tablet (12.5 mg total) by mouth 2 (two) times daily. 60 tablet 3  . diltiazem (CARDIZEM CD) 240 MG 24 hr capsule Take 1 capsule (240 mg total) by mouth daily. 30 capsule 3  . warfarin (COUMADIN) 7.5 MG tablet Take 1 to 1 and 1/2 tablets by mouth daily as directed by coumadin clinic 35 tablet 5   No current facility-administered medications for this visit.     LABS/IMAGING: Results for orders placed or performed in visit on 05/13/17 (from the past 48 hour(s))  POCT INR     Status: None   Collection Time: 05/13/17   9:08 AM  Result Value Ref Range   INR 2.4    No results found.  VITALS: BP (!) 154/105   Pulse (!) 116   Ht 6' (1.829 m)   Wt 229 lb 3.2 oz (104 kg)   BMI 31.09 kg/m   EXAM: General appearance: alert and no distress Neck: no carotid bruit and no JVD Lungs: clear to auscultation bilaterally Heart: irregularly irregular rhythm Abdomen: soft, non-tender; bowel sounds normal; no masses,  no organomegaly Extremities: extremities normal, atraumatic, no cyanosis or edema Pulses: 2+ and symmetric Skin: Skin color, texture, turgor normal. No rashes or lesions Neurologic: Grossly normal  EKG: Atrial fibrillation with rapid ventricular response of 116  ASSESSMENT: 1. Long-standing persistent atrial fibrillation, CHADSVASC score of 4 on warfarin by his preference 2. 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 History of DVT 5. History of cancer 6. Hypertension-controlled  PLAN: 1.   Jordan [name] has elevated heart rate today and reports terrible compliance with twice-daily carvedilol. I like for him to try to work on medication compliance. If this is not improved then will likely need to increase his carvedilol to 25 mg twice a day. He has a warfarin clinic follow-up and will schedule a concomitant hypertension clinic evaluation. Blood pressure and/or heart rate remains elevated, then I would advise increasing his carvedilol.  Follow-up with me in 3 months.  Pixie Casino, MD, [facility] Attending Cardiologist [place] 05/13/2017, 9:57 AM

## 2017-05-13 NOTE — Patient Instructions (Signed)
Your physician wants you to follow-up in: 3 months with Dr. Debara Pickett. You will receive a reminder letter in the mail two months in advance. If you don't receive a letter, please call our office to schedule the follow-up appointment.  Please schedule a blood pressure check appointment the same day as your next coumadin check. -- if you monitor your blood pressure (BP) at home, please bring your BP cuff and your BP readings with you to this appointment -- please check your BP no more than twice daily, after you have been sitting/resting for 5-10 minutes, at least 1 hour after taking your BP medications

## 2017-06-01 NOTE — Progress Notes (Deleted)
Patient ID: Jordan [name].                 DOB: [birth year]                      MRN: [id number]     HPI: Jordan [name] is a 71 y.o. male referred by Dr. Debara Pickett to HTN clinic. PMH includes A-Fib, non-ischemic cardiomyopathy, history of DVT, hystory of cancer and hypertension. During last office visit with cardiologist was noted patient was taking carvedilol once daily instead of twice daily.   Patient presents today for HTN assessment and warfarin management.  ***  Current HTN meds:  Diltiazem 240mg  daily Carvedilol 12.5mg  twice dialy  BP goal: 130/80  Family History: reports cancer from mother and stroke from father  Social History: reports that he has never smoked. He has never used smokeless tobacco. He reports that he drinks alcohol. He reports that he does not use drugs.  Diet:   Exercise:   Home BP readings:   Wt Readings from Last 3 Encounters:  05/13/17 229 lb 3.2 oz (104 kg)  04/11/17 225 lb (102.1 kg)  02/15/17 230 lb (104.3 kg)   BP Readings from Last 3 Encounters:  05/13/17 (!) 154/105  04/11/17 (!) 136/93  02/15/17 (!) 152/78   Pulse Readings from Last 3 Encounters:  05/13/17 (!) 116  04/11/17 63  02/15/17 (!) 56    Past Medical History:  Diagnosis Date  . Atrial fib/flutter, transient    Hx  . Atrial fibrillation (HCC)    coumadin  . Colon cancer [facility])    family hx  . Diverticulosis   . Hemorrhoids   . Hepatitis C   . History of DVT (deep vein thrombosis)   . History of nuclear stress test 04/2002   exercise; normal study   . Hypertension   . 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 Personal history of colonic polyps   . Prostate cancer [facility])    radical prostatectomy     Current Outpatient Prescriptions on File Prior to Visit  Medication Sig Dispense Refill  . aspirin 81 MG chewable tablet Chew 81 mg by mouth daily.    . carvedilol (COREG) 12.5 MG tablet Take 1 tablet (12.5 mg total) by mouth 2 (two)  times daily. 60 tablet 3  . diltiazem (CARDIZEM CD) 240 MG 24 hr capsule Take 1 capsule (240 mg total) by mouth daily. 30 capsule 3  . warfarin (COUMADIN) 7.5 MG tablet Take 1 to 1 and 1/2 tablets by mouth daily as directed by coumadin clinic 35 tablet 5   No current facility-administered medications on file prior to visit.     No Known Allergies  There were no vitals taken for this visit.  Essential hypertension: BMET today ***, Lisinopril 10mg  daily***  Jaclene Bartelt Rodriguez-Guzman PharmD, [place] [address] [place] [id number] 06/01/2017 9:17 PM

## 2017-06-02 ENCOUNTER — Ambulatory Visit: Payer: Medicare Other

## 2017-08-03 ENCOUNTER — Other Ambulatory Visit: Payer: Self-pay | Admitting: Internal Medicine

## 2017-08-03 NOTE — Telephone Encounter (Signed)
Rx(s) sent to pharmacy electronically.  

## 2017-08-15 ENCOUNTER — Ambulatory Visit: Payer: Medicare Other | Admitting: Internal Medicine

## 2017-08-31 IMAGING — CR DG FOOT COMPLETE 3+V*L*
3 series · 3 of 3 positions shown · non-contrast
Comparison: None.

CLINICAL DATA: Left foot pain secondary to blunt trauma 1 week ago.

EXAM:
LEFT FOOT - COMPLETE 3+ VIEW

[t foot ap left]
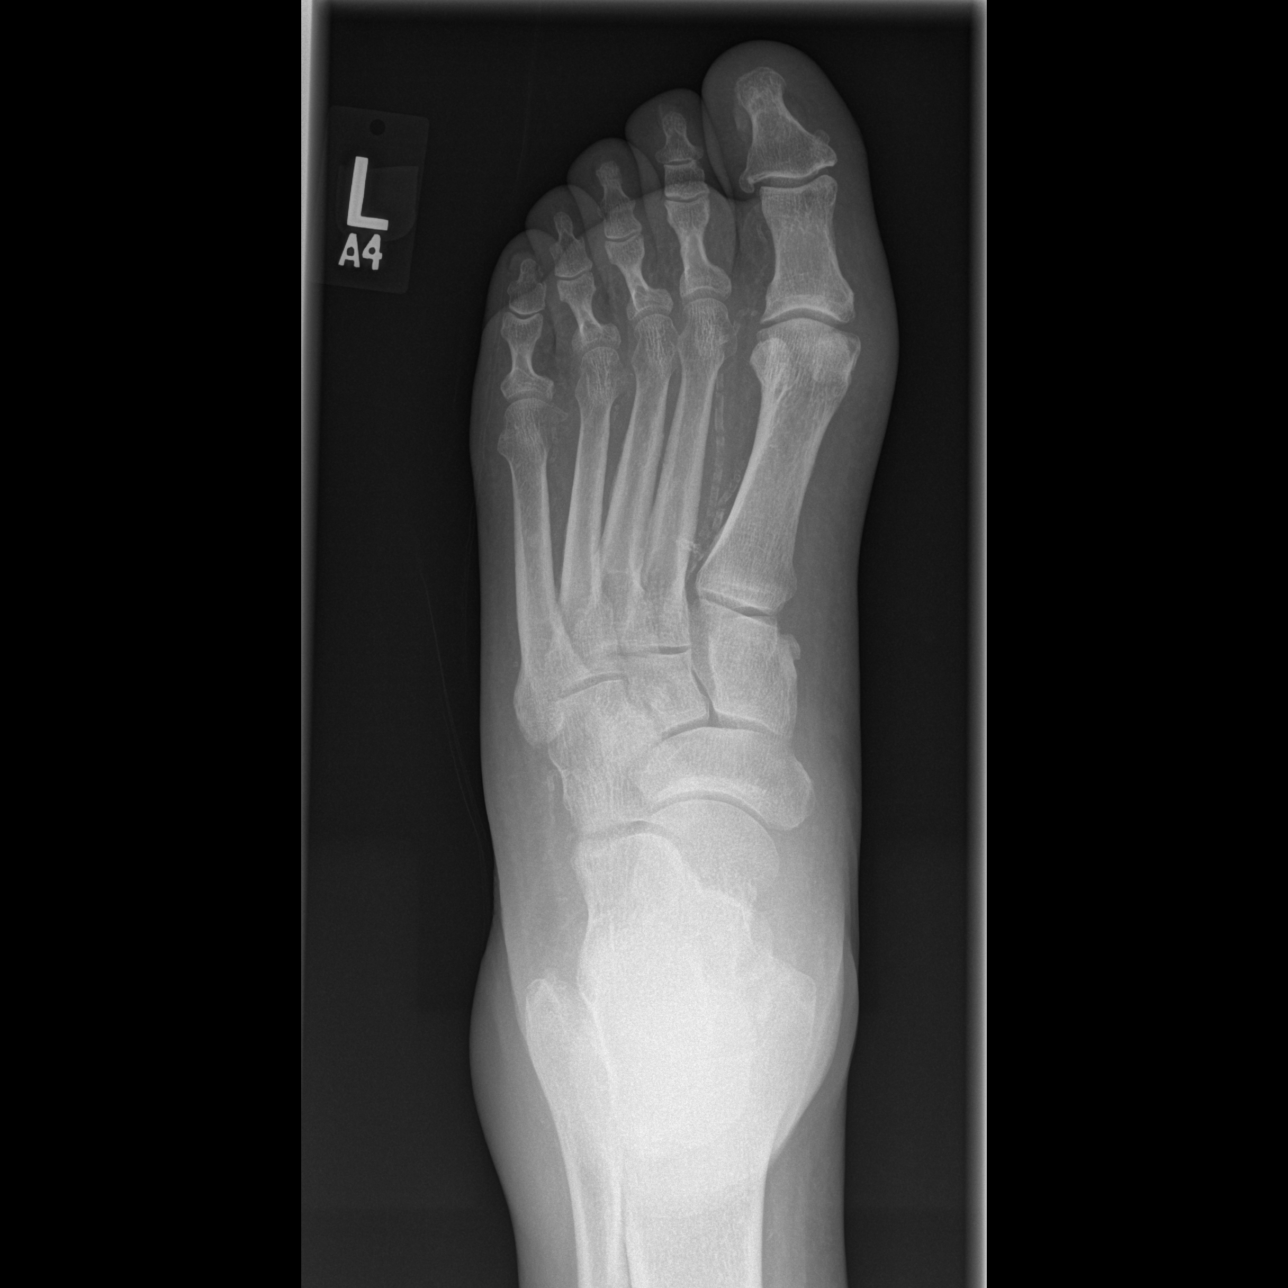

[t foot oblique left]
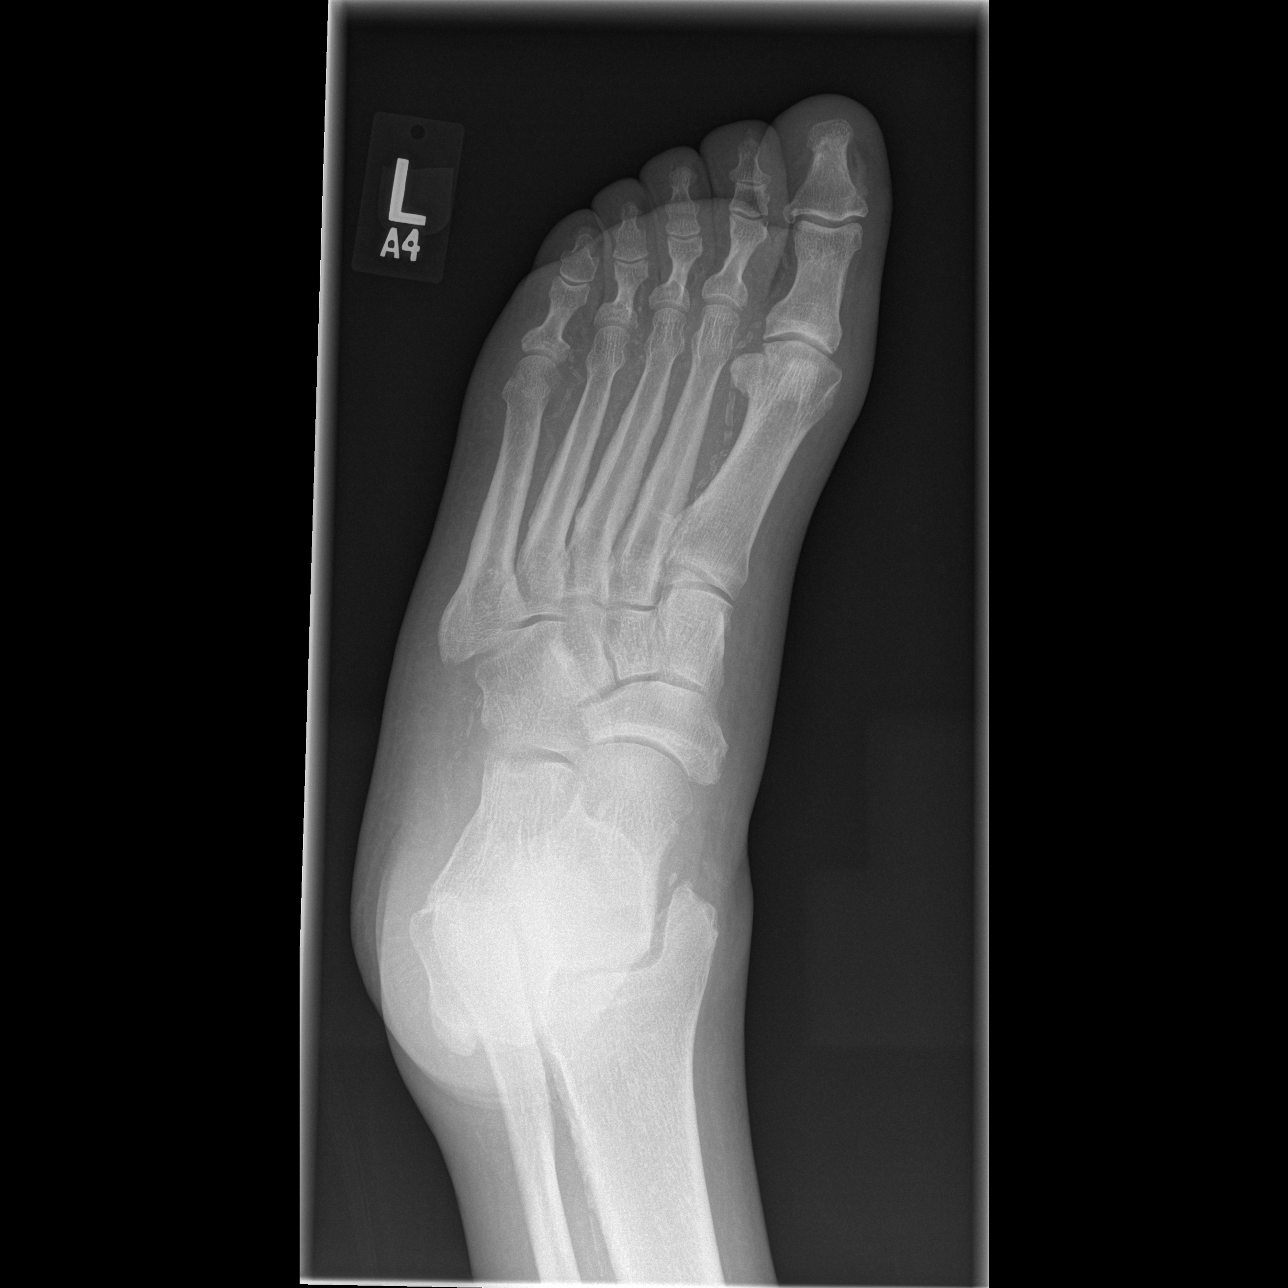

[t foot lat left]
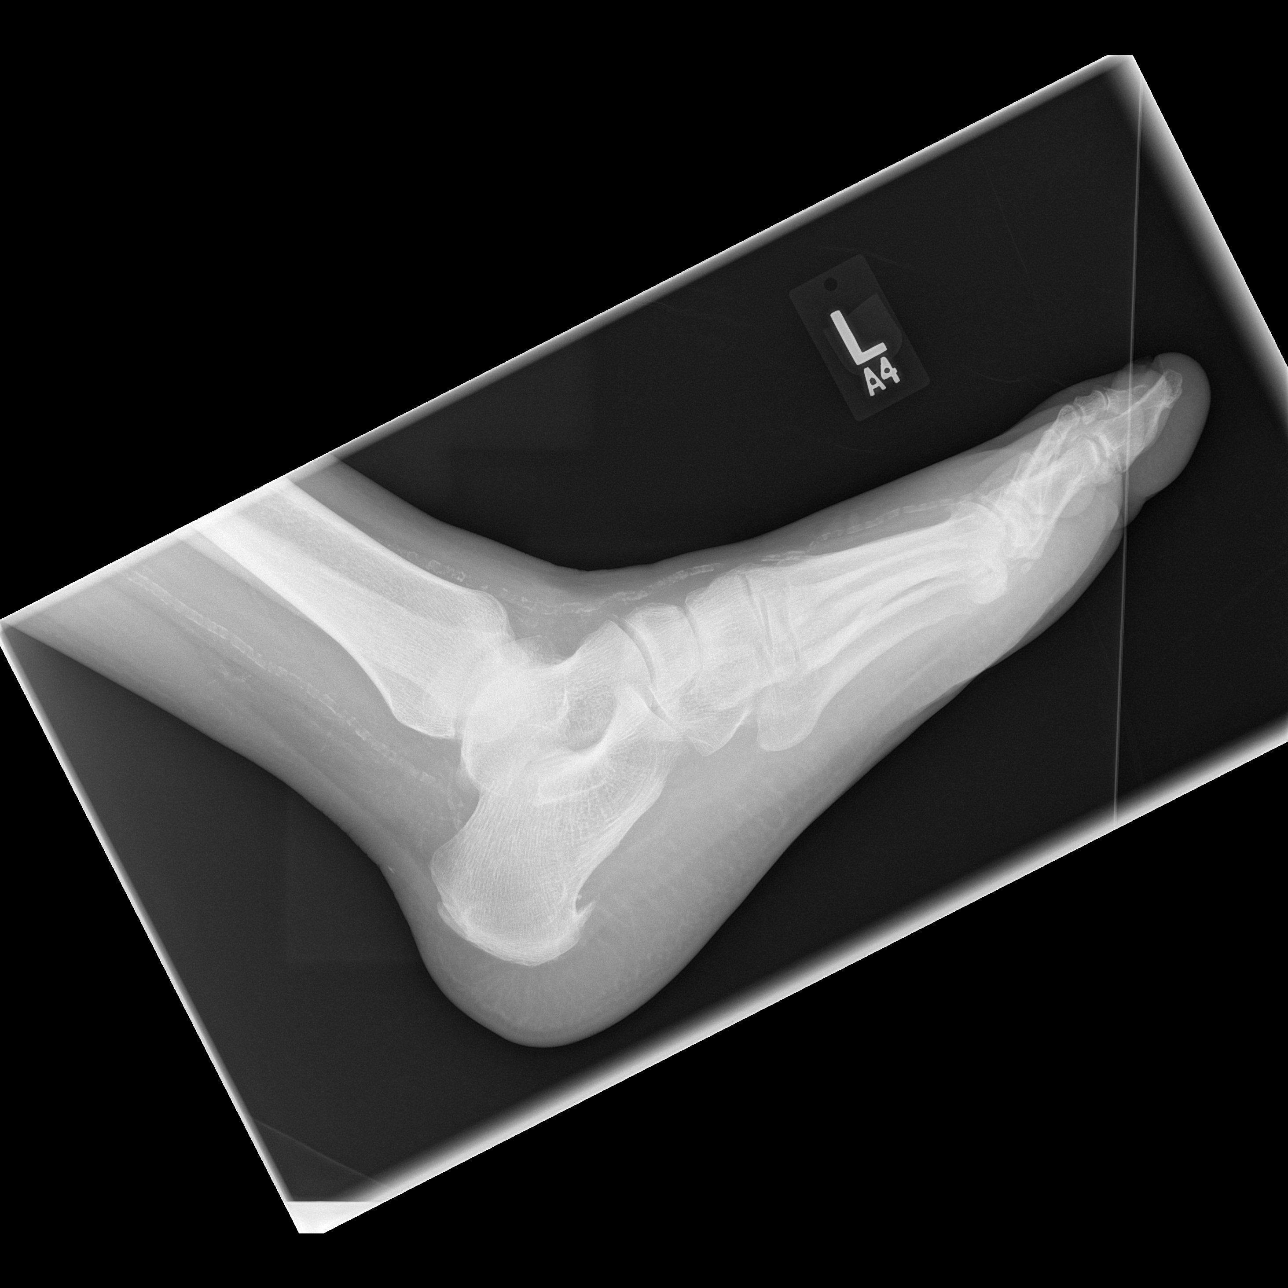

[3 of 3 positions shown; findings below may reference images not displayed]

FINDINGS: There is no evidence of fracture or dislocation. Moderate
osteoarthritis of the first MTP joint. Slight arthritis of the IP
joints of the first and second toes.

Extensive arterial vascular calcifications in the foot and ankle.
IMPRESSION: No acute abnormality.  Arthritic changes as described.

## 2017-10-05 ENCOUNTER — Other Ambulatory Visit: Payer: Self-pay | Admitting: Internal Medicine

## 2017-12-06 ENCOUNTER — Other Ambulatory Visit: Payer: Self-pay | Admitting: Internal Medicine

## 2017-12-07 ENCOUNTER — Other Ambulatory Visit: Payer: Self-pay

## 2018-01-30 ENCOUNTER — Telehealth: Payer: Self-pay | Admitting: Pharmacist Clinician (PhC)/ Clinical Pharmacy Specialist

## 2018-01-30 NOTE — Telephone Encounter (Signed)
LMOM - is patient still taking warfarin?

## 2018-02-14 ENCOUNTER — Other Ambulatory Visit: Payer: Self-pay | Admitting: Internal Medicine

## 2018-02-14 DIAGNOSIS — I4819 Other persistent atrial fibrillation: Secondary | ICD-10-CM

## 2018-02-21 ENCOUNTER — Ambulatory Visit (INDEPENDENT_AMBULATORY_CARE_PROVIDER_SITE_OTHER): Payer: Medicare Other | Admitting: Pharmacist

## 2018-02-21 DIAGNOSIS — I4891 Unspecified atrial fibrillation: Secondary | ICD-10-CM | POA: Diagnosis not present

## 2018-02-21 DIAGNOSIS — Z7901 Long term (current) use of anticoagulants: Secondary | ICD-10-CM | POA: Diagnosis not present

## 2018-02-21 LAB — POCT INR: INR: 2.3

## 2018-03-15 ENCOUNTER — Other Ambulatory Visit: Payer: Self-pay | Admitting: Internal Medicine

## 2018-04-05 ENCOUNTER — Ambulatory Visit (INDEPENDENT_AMBULATORY_CARE_PROVIDER_SITE_OTHER): Payer: Medicare Other | Admitting: Pharmacist Clinician (PhC)/ Clinical Pharmacy Specialist

## 2018-04-05 DIAGNOSIS — I481 Persistent atrial fibrillation: Secondary | ICD-10-CM | POA: Diagnosis not present

## 2018-04-05 DIAGNOSIS — Z7901 Long term (current) use of anticoagulants: Secondary | ICD-10-CM

## 2018-04-05 DIAGNOSIS — I4819 Other persistent atrial fibrillation: Secondary | ICD-10-CM

## 2018-04-05 DIAGNOSIS — I4891 Unspecified atrial fibrillation: Secondary | ICD-10-CM

## 2018-04-05 LAB — POCT INR: INR: 1.4

## 2018-04-29 ENCOUNTER — Other Ambulatory Visit: Payer: Self-pay | Admitting: Internal Medicine

## 2018-05-01 NOTE — Telephone Encounter (Signed)
REFILL 

## 2018-05-02 ENCOUNTER — Other Ambulatory Visit: Payer: Self-pay | Admitting: Internal Medicine

## 2018-05-02 DIAGNOSIS — I4819 Other persistent atrial fibrillation: Secondary | ICD-10-CM

## 2018-05-03 ENCOUNTER — Ambulatory Visit (INDEPENDENT_AMBULATORY_CARE_PROVIDER_SITE_OTHER): Payer: Medicare Other | Admitting: Pharmacist

## 2018-05-03 ENCOUNTER — Encounter: Payer: Self-pay | Admitting: Internal Medicine

## 2018-05-03 ENCOUNTER — Ambulatory Visit: Payer: Medicare Other | Admitting: Internal Medicine

## 2018-05-03 VITALS — BP 154/104 | HR 96 | Ht 72.0 in | Wt 227.0 lb

## 2018-05-03 DIAGNOSIS — I4891 Unspecified atrial fibrillation: Secondary | ICD-10-CM

## 2018-05-03 DIAGNOSIS — I428 Other cardiomyopathies: Secondary | ICD-10-CM

## 2018-05-03 DIAGNOSIS — I4819 Other persistent atrial fibrillation: Secondary | ICD-10-CM

## 2018-05-03 DIAGNOSIS — Z7901 Long term (current) use of anticoagulants: Secondary | ICD-10-CM | POA: Diagnosis not present

## 2018-05-03 DIAGNOSIS — Z79899 Other long term (current) drug therapy: Secondary | ICD-10-CM | POA: Diagnosis not present

## 2018-05-03 DIAGNOSIS — I481 Persistent atrial fibrillation: Secondary | ICD-10-CM

## 2018-05-03 DIAGNOSIS — Z13220 Encounter for screening for lipoid disorders: Secondary | ICD-10-CM | POA: Diagnosis not present

## 2018-05-03 LAB — LIPID PANEL
CHOL/HDL RATIO: 2.9 ratio (ref 0.0–5.0)
CHOLESTEROL TOTAL: 157 mg/dL (ref 100–199)
HDL: 55 mg/dL (ref >39)
LDL CALC: 88 mg/dL (ref 0–99)
TRIGLYCERIDES: 69 mg/dL (ref 0–149)
VLDL CHOLESTEROL CAL: 14 mg/dL (ref 5–40)

## 2018-05-03 LAB — COMPREHENSIVE METABOLIC PANEL
ALBUMIN: 4 g/dL (ref 3.5–4.8)
ALK PHOS: 99 IU/L (ref 39–117)
ALT: 49 IU/L — ABNORMAL HIGH (ref 0–44)
AST: 44 IU/L — AB (ref 0–40)
Albumin/Globulin Ratio: 1.2 (ref 1.2–2.2)
BILIRUBIN TOTAL: 1.2 mg/dL (ref 0.0–1.2)
BUN / CREAT RATIO: 13 (ref 10–24)
BUN: 19 mg/dL (ref 8–27)
CHLORIDE: 104 mmol/L (ref 96–106)
CO2: 18 mmol/L — AB (ref 20–29)
CREATININE: 1.42 mg/dL — AB (ref 0.76–1.27)
Calcium: 9.6 mg/dL (ref 8.6–10.2)
GFR calc Af Amer: 57 mL/min/{1.73_m2} — ABNORMAL LOW (ref >59)
GFR calc non Af Amer: 49 mL/min/{1.73_m2} — ABNORMAL LOW (ref >59)
GLOBULIN, TOTAL: 3.3 g/dL (ref 1.5–4.5)
GLUCOSE: 117 mg/dL — AB (ref 65–99)
Potassium: 4.7 mmol/L (ref 3.5–5.2)
SODIUM: 139 mmol/L (ref 134–144)
Total Protein: 7.3 g/dL (ref 6.0–8.5)

## 2018-05-03 LAB — CBC
Hematocrit: 49.3 % (ref 37.5–51.0)
Hemoglobin: 17.6 g/dL (ref 13.0–17.7)
MCH: 31 pg (ref 26.6–33.0)
MCHC: 35.7 g/dL (ref 31.5–35.7)
MCV: 87 fL (ref 79–97)
Platelets: 188 10*3/uL (ref 150–379)
RBC: 5.68 x10E6/uL (ref 4.14–5.80)
RDW: 14.6 % (ref 12.3–15.4)
WBC: 9.6 10*3/uL (ref 3.4–10.8)

## 2018-05-03 LAB — PRO B NATRIURETIC PEPTIDE: NT-Pro BNP: 903 pg/mL — ABNORMAL HIGH (ref 0–376)

## 2018-05-03 LAB — POCT INR: INR: 1.1

## 2018-05-03 MED ORDER — WARFARIN SODIUM 7.5 MG PO TABS: ORAL_TABLET | ORAL | 0 refills | Status: DC

## 2018-05-03 NOTE — Patient Instructions (Signed)
Your physician recommends that you return for lab work TODAY  Your physician wants you to follow-up in: 4-6 weeks with Dr. Debara Pickett (ok to double book)

## 2018-05-03 NOTE — Progress Notes (Signed)
OFFICE NOTE  Chief Complaint:  Follow-up heart failure and A. fib  Primary Care Physician: Denita Lung, MD  HPI:  Jordan [name]. is a 72 year old gentleman with history of paroxysmal A-fib and DVT in the past, as well as hypertension. He has been on Coumadin. He recently had some left lower extremity swelling and underwent Doppler ultrasound. This did not demonstrate any DVT; however, there was no evaluation for superficial reflux. 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 He also has paroxysmal A-fib with no real recurrence recently and he is chronically anticoagulated on Coumadin. His INR had been 3.6. Medication was held and then his INR was then subtherapeutic, restarted and now is at 3.6 today. Unfortunately, I think the dose of Coumadin he is on at 10 mg is just too much for him. He has a history of hypertension which has not been bothersome and he noted that his swelling has come down since he saw our PA about 6 or 7 days ago. He has as a primary care provider who drew some fluid off of his knee. He was fitted with compression stockings and reports marked improvement of his swelling. 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 His INR checked today shows excellent control on his current regimen which is actually a fairly high dose of warfarin at an INR of 2.5.  I had the pleasure seeing Jordan [name] back today. He has not followed up since September 2014. His last INR check was September 2015, which is about 6 months ago. He says that with his work he is not able to get regular INR checks, but we discussed at length today the importance of monthly INR monitoring and the dangers of not following his INR closely. His INR was checked in the office and was elevated today at 3.7. He's had high levels in the past is well. We discussed options including switching  him to a NOAC or possibly arranging for him to get blood work near work and send those values in for [place] to adjust. He tells [place] that he is actually going to retire in May and we may be able to get him through until that time. He denies any chest pain or worsening shortness of breath.  01/07/2017  Jordan [name] returns today for follow-up. His blood pressure is notably elevated 146/100. Heart rate is not controlled at 119. He has been somewhat compliant with his INRs however I did not see him since March 2016. He says that the cost is an issue with follow-up appointments. Despite his elevated heart rate with A. fib he is not had an echocardiogram that I can see in the recent past. He denies any chest pain but does get some shortness of breath with exertion.  02/15/2017  Jordan [name] returns back today for follow-up. Heart rate is now better controlled at 56 however he remains in atrial fibrillation. He underwent an echocardiogram does show a mildly reduced LV EF of 45-50% and severe left atrial enlargement. I reviewed a number of his EKGs from the past indicating he's probably been in persistent or long-standing persistent atrial fibrillation since 2014. Based on this, and given his severe lower atrial enlargement, it is unlikely for [place] to reestablish sinus rhythm unless we use antiarrhythmic medication. He seems to be unaware of his A. fib and has NYHA class 1-2 heart  failure symptoms. I would recommend further medical therapy for congestive heart failure at this time.  5/18/018  Jordan [name] returns for follow-up today. He is without complaints. He remains in A. fib and heart rate is elevated today 116. He says he's not very compliant with twice-daily carvedilol dosing but is willing to work on it. Blood pressure is also elevated today to recheck was not much improved. His INR was checked in warfarin was adjusted by pharmacy. She denies any chest pain or shortness of breath.  05/03/2018  Jordan [name] is  seen today for annual follow-up.  He reports has been out of his warfarin for about 3 days.  He was up in [place] with an ailing family member.  He did not realize he could get a refill of his prescription there.  His INR was low today.  We will further adjust that.  He is also been out of some of his blood pressure medicines which she refilled and just recently started taking.  Blood pressure is notably elevated today 154/104.  He has rate controlled A. fib.  He was noted to have severe atrial enlargement which make it unlikely for him to establish sinus rhythm with cardioversion.  PMHx:  Past Medical History:  Diagnosis Date  . Atrial fib/flutter, transient    Hx  . Atrial fibrillation (HCC)    coumadin  . Colon cancer [facility])    family hx  . Diverticulosis   . Hemorrhoids   . Hepatitis C   . History of DVT (deep vein thrombosis)   . History of nuclear stress test 04/2002   exercise; normal study   . Hypertension   . 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 Personal history of colonic polyps   . Prostate cancer [facility])    radical prostatectomy     Past Surgical History:  Procedure Laterality Date  . CARDIAC CATHETERIZATION  2007   no occlusive CAD  . COLONOSCOPY  [id number]   [name]  . PROSTATECTOMY    . PROSTATECTOMY  04-04   Dr. Risa Grill  . TRANSTHORACIC ECHOCARDIOGRAM  05/2006   EF ~14%; LV systolic function normal; mild focal basal septal hypertrophy; AV thickness mildly increased; LA mod-markedly dilate    FAMHx:  Family History  Problem Relation Age of Onset  . Brain cancer Mother        brain tumor  . Stroke Father     SOCHx:   reports that he has never smoked. He has never used smokeless tobacco. He reports that he drinks alcohol. He reports that he does not use drugs.  ALLERGIES:  No Known Allergies  ROS: Pertinent items noted in HPI and remainder of comprehensive ROS otherwise negative.  HOME MEDS: Current Outpatient  Medications  Medication Sig Dispense Refill  . aspirin 81 MG chewable tablet Chew 81 mg by mouth daily.    Marland Kitchen CARTIA XT 240 MG 24 hr capsule TAKE ONE CAPSULE BY MOUTH DAILY 30 capsule 5  . carvedilol (COREG) 12.5 MG tablet TAKE 1 TABLET BY MOUTH TWICE DAILY( DOSE CHANGE) 60 tablet 0  . warfarin (COUMADIN) 7.5 MG tablet TAKE 1 TO 1 AND 1/2 TABLETS BY MOUTH DAILY AS DIRECTED BY COUMADIN CLINIC 35 tablet 0   No current facility-administered medications for this visit.     LABS/IMAGING: Results for orders placed or performed in visit on 05/03/18 (from the past 48 hour(s))  POCT INR     Status: None   Collection  Time: 05/03/18  9:21 AM  Result Value Ref Range   INR 1.1    No results found.  VITALS: BP (!) 154/104   Pulse 96   Ht 6' (1.829 m)   Wt 227 lb (103 kg)   BMI 30.79 kg/m   EXAM: General appearance: alert and no distress Neck: no carotid bruit and no JVD Lungs: clear to auscultation bilaterally Heart: irregularly irregular rhythm Abdomen: soft, non-tender; bowel sounds normal; no masses,  no organomegaly Extremities: extremities normal, atraumatic, no cyanosis or edema Pulses: 2+ and symmetric Skin: Skin color, texture, turgor normal. No rashes or lesions Neurologic: Grossly normal  EKG: A. fib with controlled ventricular response and PVCs at 96-personally reviewed  ASSESSMENT: 1. Long-standing persistent atrial fibrillation, CHADSVASC score of 4 on warfarin by his preference 2. 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 History of DVT 5. History of cancer 6. Hypertension-controlled  PLAN: 1.   Jordan [name] has had some noncompliance with medications recently but will be reestablishing his medications.  Blood pressure is poorly controlled today.  He would benefit from further medication adjustment.  He has had no lab work that I can tell in the last several years.  We will check a  CBC, comprehensive metabolic profile, BNP and lipid profile today.  Based on these values if renal function is normal, I would likely recommend starting an ACE inhibitor or ARB.  Due to financial issues, he will not likely be able to afford Entresto.  He is not taking a novel oral anticoagulant due to cost issues as well. Follow-up in 1-2 months.  Pixie Casino, MD, [facility], [place] Director of the Advanced Lipid Disorders &  Cardiovascular Risk Reduction Clinic Diplomate of the American Board of Clinical Lipidology Attending Cardiologist  Direct Dial: [phone]  Fax: [phone]  Website:  [name] 05/03/2018, 9:27 AM

## 2018-05-05 ENCOUNTER — Telehealth: Payer: Self-pay | Admitting: Internal Medicine

## 2018-05-05 ENCOUNTER — Encounter: Payer: Self-pay | Admitting: Internal Medicine

## 2018-05-05 DIAGNOSIS — Z79899 Other long term (current) drug therapy: Secondary | ICD-10-CM

## 2018-05-05 MED ORDER — LOSARTAN POTASSIUM-HCTZ 50-12.5 MG PO TABS: 1.0000 | ORAL_TABLET | Freq: Every day | ORAL | 11 refills | Status: DC

## 2018-05-05 MED ORDER — CARVEDILOL 25 MG PO TABS: 25.0000 mg | ORAL_TABLET | Freq: Two times a day (BID) | ORAL | 11 refills | Status: DC

## 2018-05-05 NOTE — Telephone Encounter (Signed)
-----   Message from Pixie Casino, MD sent at 05/03/2018  5:08 PM EDT ----- Based on labs - stop diltiazem 240 mg daily. Increase coreg to 25 mg BID. Start losartan HCT 50/12.5 mg daily. Repeat BMET in 2 weeks. Will address lipid profile at follow-up.  Dr. Lemmie Evens

## 2018-05-05 NOTE — Telephone Encounter (Signed)
Patient called with MD recommendations. He agrees w/plan and voiced understanding of med changes. Rx(s) sent to pharmacy electronically. BMET ordered, lab slip & copy of results mailed to patient.

## 2018-05-28 ENCOUNTER — Other Ambulatory Visit: Payer: Self-pay | Admitting: Internal Medicine

## 2018-06-13 ENCOUNTER — Encounter: Payer: Self-pay | Admitting: Internal Medicine

## 2018-06-13 ENCOUNTER — Ambulatory Visit: Payer: Medicare Other | Admitting: Internal Medicine

## 2018-06-13 ENCOUNTER — Ambulatory Visit (INDEPENDENT_AMBULATORY_CARE_PROVIDER_SITE_OTHER): Payer: Medicare Other | Admitting: Pharmacist Clinician (PhC)/ Clinical Pharmacy Specialist

## 2018-06-13 VITALS — BP 140/76 | HR 72 | Ht 72.0 in | Wt 224.0 lb

## 2018-06-13 DIAGNOSIS — I4891 Unspecified atrial fibrillation: Secondary | ICD-10-CM | POA: Diagnosis not present

## 2018-06-13 DIAGNOSIS — I5022 Chronic systolic (congestive) heart failure: Secondary | ICD-10-CM

## 2018-06-13 DIAGNOSIS — I428 Other cardiomyopathies: Secondary | ICD-10-CM | POA: Diagnosis not present

## 2018-06-13 DIAGNOSIS — I481 Persistent atrial fibrillation: Secondary | ICD-10-CM

## 2018-06-13 DIAGNOSIS — Z7901 Long term (current) use of anticoagulants: Secondary | ICD-10-CM

## 2018-06-13 DIAGNOSIS — I4819 Other persistent atrial fibrillation: Secondary | ICD-10-CM

## 2018-06-13 LAB — BASIC METABOLIC PANEL
BUN / CREAT RATIO: 15 (ref 10–24)
BUN: 19 mg/dL (ref 8–27)
CHLORIDE: 105 mmol/L (ref 96–106)
CO2: 17 mmol/L — ABNORMAL LOW (ref 20–29)
Calcium: 9 mg/dL (ref 8.6–10.2)
Creatinine, Ser: 1.29 mg/dL — ABNORMAL HIGH (ref 0.76–1.27)
GFR, EST AFRICAN AMERICAN: 64 mL/min/{1.73_m2} (ref >59)
GFR, EST NON AFRICAN AMERICAN: 55 mL/min/{1.73_m2} — AB (ref >59)
Glucose: 104 mg/dL — ABNORMAL HIGH (ref 65–99)
Potassium: 4 mmol/L (ref 3.5–5.2)
Sodium: 137 mmol/L (ref 134–144)

## 2018-06-13 LAB — PRO B NATRIURETIC PEPTIDE: NT-PRO BNP: 1179 pg/mL — AB (ref 0–376)

## 2018-06-13 LAB — POCT INR: INR: 3.2 — AB (ref 2.0–3.0)

## 2018-06-13 NOTE — Patient Instructions (Signed)
Your physician recommends that you return for lab work TODAY  Your physician has requested that you have an echocardiogram @ [address] - [address]rd Floor. Echocardiography is a painless test that uses sound waves to create images of your heart. It provides your doctor with information about the size and shape of your heart and how well your heart's chambers and valves are working. This procedure takes approximately one hour. There are no restrictions for this procedure.  Your physician wants you to follow-up in: 6 months with Dr. Debara Pickett - after echo. You will receive a reminder letter in the mail two months in advance. If you don't receive a letter, please call our office to schedule the follow-up appointment.

## 2018-06-13 NOTE — Patient Instructions (Signed)
Description   Take 1/2 tablet today Tuesday June 18, then continue taking 1 tablet daily.  Repeat INR in 3 weeks

## 2018-06-13 NOTE — Progress Notes (Signed)
OFFICE NOTE  Chief Complaint:  Follow-up heart failure and A. fib  Primary Care Physician: Denita Lung, MD  HPI:  Jordan [name]. is a 72 year old gentleman with history of paroxysmal A-fib and DVT in the past, as well as hypertension. He has been on Coumadin. He recently had some left lower extremity swelling and underwent Doppler ultrasound. This did not demonstrate any DVT; however, there was no evaluation for superficial reflux. 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 He also has paroxysmal A-fib with no real recurrence recently and he is chronically anticoagulated on Coumadin. His INR had been 3.6. Medication was held and then his INR was then subtherapeutic, restarted and now is at 3.6 today. Unfortunately, I think the dose of Coumadin he is on at 10 mg is just too much for him. He has a history of hypertension which has not been bothersome and he noted that his swelling has come down since he saw our PA about 6 or 7 days ago. He has as a primary care provider who drew some fluid off of his knee. He was fitted with compression stockings and reports marked improvement of his swelling. 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 His INR checked today shows excellent control on his current regimen which is actually a fairly high dose of warfarin at an INR of 2.5.  I had the pleasure seeing Mr. Jordan [name] back today. He has not followed up since September 2014. His last INR check was September 2015, which is about 6 months ago. He says that with his work he is not able to get regular INR checks, but we discussed at length today the importance of monthly INR monitoring and the dangers of not following his INR closely. His INR was checked in the office and was elevated today at 3.7. He's had high levels in the past is well. We discussed options including switching  him to a NOAC or possibly arranging for him to get blood work near work and send those values in for [place] to adjust. He tells [place] that he is actually going to retire in May and we may be able to get him through until that time. He denies any chest pain or worsening shortness of breath.  01/07/2017  Mr. Jordan [name] returns today for follow-up. His blood pressure is notably elevated 146/100. Heart rate is not controlled at 119. He has been somewhat compliant with his INRs however I did not see him since March 2016. He says that the cost is an issue with follow-up appointments. Despite his elevated heart rate with A. fib he is not had an echocardiogram that I can see in the recent past. He denies any chest pain but does get some shortness of breath with exertion.  02/15/2017  Mr. Jordan [name] returns back today for follow-up. Heart rate is now better controlled at 56 however he remains in atrial fibrillation. He underwent an echocardiogram does show a mildly reduced LV EF of 45-50% and severe left atrial enlargement. I reviewed a number of his EKGs from the past indicating he's probably been in persistent or long-standing persistent atrial fibrillation since 2014. Based on this, and given his severe lower atrial enlargement, it is unlikely for [place] to reestablish sinus rhythm unless we use antiarrhythmic medication. He seems to be unaware of his A. fib and has NYHA class 1-2 heart  failure symptoms. I would recommend further medical therapy for congestive heart failure at this time.  5/18/018  Mr. Jordan [name] returns for follow-up today. He is without complaints. He remains in A. fib and heart rate is elevated today 116. He says he's not very compliant with twice-daily carvedilol dosing but is willing to work on it. Blood pressure is also elevated today to recheck was not much improved. His INR was checked in warfarin was adjusted by pharmacy. She denies any chest pain or shortness of breath.  05/03/2018  Mr. Jordan [name] is  seen today for annual follow-up.  He reports has been out of his warfarin for about 3 days.  He was up in [place] with an ailing family member.  He did not realize he could get a refill of his prescription there.  His INR was low today.  We will further adjust that.  He is also been out of some of his blood pressure medicines which she refilled and just recently started taking.  Blood pressure is notably elevated today 154/104.  He has rate controlled A. fib.  He was noted to have severe atrial enlargement which make it unlikely for him to establish sinus rhythm with cardioversion.  06/13/2018  Mr. Jordan [name] returns today for follow-up.  I readjusted his medications a few months ago when he seen some improvement in his symptoms.  His INR was slightly off today and readjusted by her pharmacist Cyril Mourning.  Blood pressure is very difficult to auscultate.  Initially was 140/76 and we measured about 136/92.  Weight is down 3 pounds.  Again he seems to be tolerating medications.  He will need LV reassessment in 6 months.  PMHx:  Past Medical History:  Diagnosis Date  . Atrial fib/flutter, transient    Hx  . Atrial fibrillation (HCC)    coumadin  . Colon cancer [facility])    family hx  . Diverticulosis   . Hemorrhoids   . Hepatitis C   . History of DVT (deep vein thrombosis)   . History of nuclear stress test 04/2002   exercise; normal study   . Hypertension   . 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 Personal history of colonic polyps   . Prostate cancer [facility])    radical prostatectomy     Past Surgical History:  Procedure Laterality Date  . CARDIAC CATHETERIZATION  2007   no occlusive CAD  . COLONOSCOPY  [id number]   Jordan [name]  . PROSTATECTOMY    . PROSTATECTOMY  04-04   Dr. Risa Grill  . TRANSTHORACIC ECHOCARDIOGRAM  05/2006   EF ~41%; LV systolic function normal; mild focal basal septal hypertrophy; AV thickness mildly increased; LA mod-markedly dilate    FAMHx:   Family History  Problem Relation Age of Onset  . Brain cancer Mother        brain tumor  . Stroke Father     SOCHx:   reports that he has never smoked. He has never used smokeless tobacco. He reports that he drinks alcohol. He reports that he does not use drugs.  ALLERGIES:  No Known Allergies  ROS: Pertinent items noted in HPI and remainder of comprehensive ROS otherwise negative.  HOME MEDS: Current Outpatient Medications  Medication Sig Dispense Refill  . aspirin 81 MG chewable tablet Chew 81 mg by mouth daily.    . carvedilol (COREG) 25 MG tablet Take 1 tablet (25 mg total) by mouth 2 (two) times daily with a meal. 60 tablet  11  . losartan-hydrochlorothiazide (HYZAAR) 50-12.5 MG tablet Take 1 tablet by mouth daily. 30 tablet 11  . warfarin (COUMADIN) 7.5 MG tablet Take 1 to 2 tablets daily as directed by coumadin clinic 60 tablet 0   No current facility-administered medications for this visit.     LABS/IMAGING: No results found for this or any previous visit (from the past 48 hour(s)). No results found.  VITALS: BP 140/76   Pulse 72   Ht 6' (1.829 m)   Wt 224 lb (101.6 kg)   BMI 30.38 kg/m   EXAM: General appearance: alert and no distress Neck: no carotid bruit and no JVD Lungs: clear to auscultation bilaterally Heart: irregularly irregular rhythm Abdomen: soft, non-tender; bowel sounds normal; no masses,  no organomegaly Extremities: extremities normal, atraumatic, no cyanosis or edema Pulses: 2+ and symmetric Skin: Skin color, texture, turgor normal. No rashes or lesions Neurologic: Grossly normal  EKG: Deferred  ASSESSMENT: 1. Long-standing persistent atrial fibrillation, CHADSVASC score of 4 on warfarin by his preference 2. 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 History of DVT 5. History of cancer 6. Hypertension-controlled  PLAN: 1.   Mr. Wiberg seems to  be doing better on his current medications.  We will plan to recheck laboratory work and make no changes to his medications today.  INR is slightly off and will be readjusted.  He will need reassessment of his LVEF in 6 to 12 months.  Follow-up with me in 6 months.  Pixie Casino, MD, [facility], [place] Director of the Advanced Lipid Disorders &  Cardiovascular Risk Reduction Clinic Diplomate of the American Board of Clinical Lipidology Attending Cardiologist  Direct Dial: [phone]  Fax: [phone]  Website:  [email or web address] 06/13/2018, 9:17 AM

## 2018-06-27 ENCOUNTER — Other Ambulatory Visit (HOSPITAL_COMMUNITY): Payer: Medicare Other

## 2018-07-27 ENCOUNTER — Other Ambulatory Visit: Payer: Self-pay | Admitting: Internal Medicine

## 2018-07-27 DIAGNOSIS — I4819 Other persistent atrial fibrillation: Secondary | ICD-10-CM

## 2018-10-03 ENCOUNTER — Telehealth: Payer: Self-pay | Admitting: Pharmacist Clinician (PhC)/ Clinical Pharmacy Specialist

## 2018-10-03 NOTE — Telephone Encounter (Signed)
Patient on warfarin, last INR June 2019, [facility] for patient to return call

## 2018-10-06 NOTE — Telephone Encounter (Signed)
Pt has been in [place] for past month, will be home this weekend, scheduled for Monday

## 2018-10-09 ENCOUNTER — Ambulatory Visit (INDEPENDENT_AMBULATORY_CARE_PROVIDER_SITE_OTHER): Payer: Medicare Other | Admitting: Pharmacist Clinician (PhC)/ Clinical Pharmacy Specialist

## 2018-10-09 DIAGNOSIS — I4891 Unspecified atrial fibrillation: Secondary | ICD-10-CM | POA: Diagnosis not present

## 2018-10-09 DIAGNOSIS — Z7901 Long term (current) use of anticoagulants: Secondary | ICD-10-CM

## 2018-10-09 DIAGNOSIS — I4821 Permanent atrial fibrillation: Secondary | ICD-10-CM

## 2018-10-09 LAB — POCT INR: INR: 5.5 — AB (ref 2.0–3.0)

## 2018-10-20 ENCOUNTER — Emergency Department (HOSPITAL_COMMUNITY): Payer: Medicare Other

## 2018-10-20 ENCOUNTER — Other Ambulatory Visit: Payer: Self-pay | Admitting: Internal Medicine

## 2018-10-20 ENCOUNTER — Encounter (HOSPITAL_COMMUNITY): Payer: Self-pay | Admitting: Emergency Medicine

## 2018-10-20 ENCOUNTER — Emergency Department (HOSPITAL_COMMUNITY)
Admission: EM | Admit: 2018-10-20 | Discharge: 2018-10-20 | Disposition: A | Discharge status: Home / Self Care | Payer: Medicare Other | Attending: Emergency Medicine | Admitting: Emergency Medicine

## 2018-10-20 DIAGNOSIS — Z8546 Personal history of malignant neoplasm of prostate: Secondary | ICD-10-CM | POA: Diagnosis not present

## 2018-10-20 DIAGNOSIS — I10 Essential (primary) hypertension: Secondary | ICD-10-CM | POA: Diagnosis not present

## 2018-10-20 DIAGNOSIS — R55 Syncope and collapse: Secondary | ICD-10-CM | POA: Diagnosis not present

## 2018-10-20 DIAGNOSIS — Z7982 Long term (current) use of aspirin: Secondary | ICD-10-CM | POA: Insufficient documentation

## 2018-10-20 DIAGNOSIS — Z79899 Other long term (current) drug therapy: Secondary | ICD-10-CM | POA: Insufficient documentation

## 2018-10-20 DIAGNOSIS — I4819 Other persistent atrial fibrillation: Secondary | ICD-10-CM

## 2018-10-20 LAB — BASIC METABOLIC PANEL
ANION GAP: 5 (ref 5–15)
BUN: 27 mg/dL — AB (ref 8–23)
CALCIUM: 8.8 mg/dL — AB (ref 8.9–10.3)
CO2: 21 mmol/L — ABNORMAL LOW (ref 22–32)
Chloride: 113 mmol/L — ABNORMAL HIGH (ref 98–111)
Creatinine, Ser: 1.7 mg/dL — ABNORMAL HIGH (ref 0.61–1.24)
GFR calc Af Amer: 45 mL/min — ABNORMAL LOW (ref >60)
GFR calc non Af Amer: 39 mL/min — ABNORMAL LOW (ref >60)
GLUCOSE: 137 mg/dL — AB (ref 70–99)
Potassium: 4.1 mmol/L (ref 3.5–5.1)
SODIUM: 139 mmol/L (ref 135–145)

## 2018-10-20 LAB — PROTIME-INR
INR: 1.51
PROTHROMBIN TIME: 18.1 s — AB (ref 11.4–15.2)

## 2018-10-20 LAB — HEPATIC FUNCTION PANEL
ALBUMIN: 3.1 g/dL — AB (ref 3.5–5.0)
ALT: 39 U/L (ref 0–44)
AST: 39 U/L (ref 15–41)
Alkaline Phosphatase: 56 U/L (ref 38–126)
BILIRUBIN DIRECT: 0.3 mg/dL — AB (ref 0.0–0.2)
Indirect Bilirubin: 1 mg/dL — ABNORMAL HIGH (ref 0.3–0.9)
Total Bilirubin: 1.3 mg/dL — ABNORMAL HIGH (ref 0.3–1.2)
Total Protein: 6.8 g/dL (ref 6.5–8.1)

## 2018-10-20 LAB — DIFFERENTIAL
ABS IMMATURE GRANULOCYTES: 0.04 10*3/uL (ref 0.00–0.07)
BASOS ABS: 0 10*3/uL (ref 0.0–0.1)
BASOS PCT: 0 %
Eosinophils Absolute: 0 10*3/uL (ref 0.0–0.5)
Eosinophils Relative: 0 %
IMMATURE GRANULOCYTES: 1 %
Lymphocytes Relative: 18 %
Lymphs Abs: 1.3 10*3/uL (ref 0.7–4.0)
MONO ABS: 0.9 10*3/uL (ref 0.1–1.0)
Monocytes Relative: 12 %
NEUTROS ABS: 5.1 10*3/uL (ref 1.7–7.7)
NEUTROS PCT: 69 %

## 2018-10-20 LAB — CBG MONITORING, ED: Glucose-Capillary: 126 mg/dL — ABNORMAL HIGH (ref 70–99)

## 2018-10-20 LAB — CBC
HCT: 50.4 % (ref 39.0–52.0)
Hemoglobin: 16.7 g/dL (ref 13.0–17.0)
MCH: 29.5 pg (ref 26.0–34.0)
MCHC: 33.1 g/dL (ref 30.0–36.0)
MCV: 89 fL (ref 80.0–100.0)
PLATELETS: 199 10*3/uL (ref 150–400)
RBC: 5.66 MIL/uL (ref 4.22–5.81)
RDW: 13.6 % (ref 11.5–15.5)
WBC: 7.3 10*3/uL (ref 4.0–10.5)
nRBC: 0 % (ref 0.0–0.2)

## 2018-10-20 LAB — I-STAT TROPONIN, ED: TROPONIN I, POC: 0.04 ng/mL (ref 0.00–0.08)

## 2018-10-20 NOTE — ED Notes (Signed)
Pt back from X-ray.  

## 2018-10-20 NOTE — ED Triage Notes (Signed)
Pt here via [facility], he was at the barber shop, had a syncopal episode in the chair and stayed seated, pt did not eat this morning, took all of his morning meds.  Clammy and diaphoretic upon EMS arrival and no radial pulses.  Initial pressure 96/70, weak a thready.  Pt taking warfarin for afib. 160 CBG, P 58-120, 98% RA.  A-FIB.  128/90 in trendelenburg. 300 NS bolus given.  A&O x4.

## 2018-10-20 NOTE — Discharge Instructions (Addendum)
Start back taking her Coumadin today.  Follow-up with your family doctor next week for recheck.  Drink plenty of fluids

## 2018-10-20 NOTE — ED Notes (Signed)
Discharge instructions discussed with Pt. Pt verbalized understanding. Pt stable and ambulatory.    

## 2018-10-20 NOTE — ED Provider Notes (Signed)
[place] EMERGENCY DEPARTMENT Provider Note   CSN: [id number] Arrival date & time: 10/20/18  1237     History   Chief Complaint No chief complaint on file.   HPI Jordan [name]. is a 72 y.o. male.  Patient states he was getting his haircut and he became dizzy and weak and passed out.  Patient states he did not have breakfast today.  He feels fine now  The history is provided by the patient. No language interpreter was used.  Loss of Consciousness   This is a new problem. The current episode started less than 1 hour ago. The problem occurs rarely. The problem has been resolved. He lost consciousness for a period of less than one minute. The problem is associated with normal activity. Associated symptoms include dizziness and visual change. Pertinent negatives include abdominal pain, back pain, chest pain, congestion, headaches and seizures. He has tried nothing for the symptoms. The treatment provided no relief. His past medical history does not include CVA.    Past Medical History:  Diagnosis Date  . Atrial fib/flutter, transient    Hx  . Atrial fibrillation (HCC)    coumadin  . Colon cancer [facility])    family hx  . Diverticulosis   . Hemorrhoids   . Hepatitis C   . History of DVT (deep vein thrombosis)   . History of nuclear stress test 04/2002   exercise; normal study   . Hypertension   . 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 Personal history of colonic polyps   . Prostate cancer [facility])    radical prostatectomy     Patient Active Problem List   Diagnosis Date Noted  . Screening for lipid disorders 05/03/2018  . NICM (nonischemic cardiomyopathy) ([place]) 02/15/2017  . Chronic systolic heart failure ([place]) 02/15/2017  . Dyspnea 01/07/2017  . Essential hypertension 01/07/2017  . Atrial fibrillation ([place]) 03/15/2013  . Long term current use of anticoagulant therapy 03/15/2013  . History of prostate cancer 02/16/2013     Past Surgical History:  Procedure Laterality Date  . CARDIAC CATHETERIZATION  2007   no occlusive CAD  . COLONOSCOPY  [id number]   [name]  . PROSTATECTOMY    . PROSTATECTOMY  04-04   Dr. Risa Grill  . TRANSTHORACIC ECHOCARDIOGRAM  05/2006   EF ~65%; LV systolic function normal; mild focal basal septal hypertrophy; AV thickness mildly increased; LA mod-markedly dilate        Home Medications    Prior to Admission medications   Medication Sig Start Date End Date Taking? Authorizing Provider  aspirin 81 MG chewable tablet Chew 81 mg by mouth daily.    [provider]  carvedilol (COREG) 25 MG tablet Take 1 tablet (25 mg total) by mouth 2 (two) times daily with a meal. 05/05/18   Hilty, Nadean Corwin, MD  losartan-hydrochlorothiazide (HYZAAR) 50-12.5 MG tablet Take 1 tablet by mouth daily. 05/05/18   Pixie Casino, MD  warfarin (COUMADIN) 7.5 MG tablet TAKE 1 TABLET BY MOUTH DAILY AS DIRECTED BY COUMADIN CLINIC 10/20/18   Hilty, Nadean Corwin, MD    Family History Family History  Problem Relation Age of Onset  . Brain cancer Mother        brain tumor  . Stroke Father     Social History Social History   Tobacco Use  . Smoking status: Never Smoker  . Smokeless tobacco: Never Used  Substance Use Topics  . Alcohol use:  Yes    Comment: occasional   . Drug use: No     Allergies   Patient has no known allergies.   Review of Systems Review of Systems  Constitutional: Negative for appetite change and fatigue.  HENT: Negative for congestion, ear discharge and sinus pressure.   Eyes: Negative for discharge.  Respiratory: Negative for cough.   Cardiovascular: Positive for syncope. Negative for chest pain.  Gastrointestinal: Negative for abdominal pain and diarrhea.  Genitourinary: Negative for frequency and hematuria.  Musculoskeletal: Negative for back pain.  Skin: Negative for rash.  Neurological: Positive for dizziness. Negative for seizures and headaches.    Psychiatric/Behavioral: Negative for hallucinations.     Physical Exam Updated Vital Signs BP 129/83   Pulse 81   Temp 97.7 F (36.5 C) (Oral)   Resp 16   Ht 6' (1.829 m)   Wt 101.6 kg   SpO2 100%   BMI 30.38 kg/m   Physical Exam  Constitutional: He is oriented to person, place, and time. He appears well-developed.  HENT:  Head: Normocephalic.  Eyes: Conjunctivae and EOM are normal. No scleral icterus.  Neck: Neck supple. No thyromegaly present.  Cardiovascular: Exam reveals no gallop and no friction rub.  No murmur heard. Irregular rhythm  Pulmonary/Chest: No stridor. He has no wheezes. He has no rales. He exhibits no tenderness.  Abdominal: He exhibits no distension. There is no tenderness. There is no rebound.  Musculoskeletal: Normal range of motion. He exhibits no edema.  Lymphadenopathy:    He has no cervical adenopathy.  Neurological: He is oriented to person, place, and time. He exhibits normal muscle tone. Coordination normal.  Skin: No rash noted. No erythema.  Psychiatric: He has a normal mood and affect. His behavior is normal.     ED Treatments / Results  Labs (all labs ordered are listed, but only abnormal results are displayed) Labs Reviewed  PROTIME-INR - Abnormal; Notable for the following components:      Result Value   Prothrombin Time 18.1 (*)    All other components within normal limits  HEPATIC FUNCTION PANEL - Abnormal; Notable for the following components:   Albumin 3.1 (*)    Total Bilirubin 1.3 (*)    Bilirubin, Direct 0.3 (*)    Indirect Bilirubin 1.0 (*)    All other components within normal limits  BASIC METABOLIC PANEL - Abnormal; Notable for the following components:   Chloride 113 (*)    CO2 21 (*)    Glucose, Bld 137 (*)    BUN 27 (*)    Creatinine, Ser 1.70 (*)    Calcium 8.8 (*)    GFR calc non Af Amer 39 (*)    GFR calc Af Amer 45 (*)    All other components within normal limits  CBG MONITORING, ED - Abnormal; Notable  for the following components:   Glucose-Capillary 126 (*)    All other components within normal limits  DIFFERENTIAL  CBC  I-STAT TROPONIN, ED    EKG EKG Interpretation  Date/Time:  Friday October 20 2018 12:50:16 EDT Ventricular Rate:  107 PR Interval:    QRS Duration: 89 QT Interval:  391 QTC Calculation: 451 R Axis:   -43 Text Interpretation:  Atrial fibrillation Paired ventricular premature complexes Abnormal R-wave progression, late transition Inferior infarct, old Confirmed by Milton Ferguson [phone]) on 10/20/2018 1:42:46 PM   Radiology Dg Chest 2 View  Result Date: 10/20/2018 CLINICAL DATA:  72 y/o  M; syncope. EXAM: CHEST -  2 VIEW COMPARISON:  06/24/2006 CT chest. 06/11/2006 chest radiograph. FINDINGS: Mild cardiomegaly. No consolidation, effusion, or pneumothorax. Symmetric small nodular opacities project over lower lung zones, likely nipple shadows. No acute osseous abnormality is evident. IMPRESSION: Mild cardiomegaly. No acute pulmonary process identified. Electronically Signed   By: Kristine Garbe M.D.   On: 10/20/2018 13:29   Ct Head Wo Contrast  Result Date: 10/20/2018 CLINICAL DATA:  Altered level of consciousness, unexplained, syncopal episode at barber shop today, found hypotensive, history atrial fibrillation, hypertension, prostate cancer EXAM: CT HEAD WITHOUT CONTRAST TECHNIQUE: Contiguous axial images were obtained from the base of the skull through the vertex without intravenous contrast. COMPARISON:  10/29/2007 FINDINGS: Brain: Normal ventricular morphology. No midline shift or mass effect. Normal appearance of brain parenchyma. No intracranial hemorrhage, mass lesion, evidence of acute infarction, or extra-axial fluid collection. Dural calcifications in falx. Vascular: No hyperdense vessels. Mild atherosclerotic calcifications within internal carotid arteries at skull base Skull: Intact Sinuses/Orbits: Small chronic partially calcified opacity at the  anterior aspect of RIGHT frontal sinus is stable. Remaining visualized paranasal sinuses and mastoid air cells clear Other: N/A IMPRESSION: No acute intracranial abnormalities. Electronically Signed   By: Lavonia Dana M.D.   On: 10/20/2018 16:35    Procedures Procedures (including critical care time)  Medications Ordered in ED Medications - No data to display   Initial Impression / Assessment and Plan / ED Course  I have reviewed the triage vital signs and the nursing notes.  Pertinent labs & imaging results that were available during my care of the patient were reviewed by me and considered in my medical decision making (see chart for details).  Patient with syncope.  Labs show mild dehydration otherwise unremarkable.  Patient's EKG shows atrial for which she has a history of.  Patient states he has not been taking his Coumadin but he got new prescription today and he is in the start back taking it.  Patient will follow-up with his PCP next week  Final Clinical Impressions(s) / ED Diagnoses   Final diagnoses:  Syncope and collapse    ED Discharge Orders    None       Milton Ferguson, MD 10/20/18 1658

## 2018-10-20 NOTE — ED Notes (Signed)
Patient transported to X-ray 

## 2018-10-20 NOTE — ED Notes (Signed)
Patient transported to CT 

## 2018-10-20 NOTE — ED Notes (Signed)
Pt back from CT

## 2018-10-26 ENCOUNTER — Ambulatory Visit (INDEPENDENT_AMBULATORY_CARE_PROVIDER_SITE_OTHER): Payer: Medicare Other | Admitting: Pharmacist Clinician (PhC)/ Clinical Pharmacy Specialist

## 2018-10-26 DIAGNOSIS — I4891 Unspecified atrial fibrillation: Secondary | ICD-10-CM

## 2018-10-26 DIAGNOSIS — I4821 Permanent atrial fibrillation: Secondary | ICD-10-CM | POA: Diagnosis not present

## 2018-10-26 DIAGNOSIS — Z7901 Long term (current) use of anticoagulants: Secondary | ICD-10-CM

## 2018-10-26 LAB — POCT INR: INR: 2.6 (ref 2.0–3.0)

## 2018-11-16 ENCOUNTER — Ambulatory Visit (INDEPENDENT_AMBULATORY_CARE_PROVIDER_SITE_OTHER): Payer: Medicare Other | Admitting: Pharmacist

## 2018-11-16 DIAGNOSIS — Z7901 Long term (current) use of anticoagulants: Secondary | ICD-10-CM

## 2018-11-16 DIAGNOSIS — I4891 Unspecified atrial fibrillation: Secondary | ICD-10-CM

## 2018-11-16 DIAGNOSIS — I4821 Permanent atrial fibrillation: Secondary | ICD-10-CM | POA: Diagnosis not present

## 2018-11-16 LAB — POCT INR: INR: 4 — AB (ref 2.0–3.0)

## 2018-11-26 ENCOUNTER — Encounter (HOSPITAL_COMMUNITY): Payer: Self-pay | Admitting: Emergency Medicine

## 2018-11-26 ENCOUNTER — Emergency Department (HOSPITAL_COMMUNITY): Payer: Medicare Other

## 2018-11-26 ENCOUNTER — Emergency Department (HOSPITAL_COMMUNITY)
Admission: EM | Admit: 2018-11-26 | Discharge: 2018-11-26 | Disposition: A | Discharge status: Home / Self Care | Payer: Medicare Other | Attending: Emergency Medicine | Admitting: Emergency Medicine

## 2018-11-26 ENCOUNTER — Other Ambulatory Visit: Payer: Self-pay

## 2018-11-26 DIAGNOSIS — I10 Essential (primary) hypertension: Secondary | ICD-10-CM | POA: Diagnosis not present

## 2018-11-26 DIAGNOSIS — Z7982 Long term (current) use of aspirin: Secondary | ICD-10-CM | POA: Diagnosis not present

## 2018-11-26 DIAGNOSIS — Z79899 Other long term (current) drug therapy: Secondary | ICD-10-CM | POA: Insufficient documentation

## 2018-11-26 DIAGNOSIS — R2242 Localized swelling, mass and lump, left lower limb: Secondary | ICD-10-CM | POA: Insufficient documentation

## 2018-11-26 DIAGNOSIS — M7989 Other specified soft tissue disorders: Secondary | ICD-10-CM

## 2018-11-26 LAB — BASIC METABOLIC PANEL
Anion gap: 12 (ref 5–15)
BUN: 22 mg/dL (ref 8–23)
CALCIUM: 8.8 mg/dL — AB (ref 8.9–10.3)
CO2: 15 mmol/L — AB (ref 22–32)
Chloride: 105 mmol/L (ref 98–111)
Creatinine, Ser: 1.24 mg/dL (ref 0.61–1.24)
GFR, EST NON AFRICAN AMERICAN: 58 mL/min — AB (ref >60)
GLUCOSE: 167 mg/dL — AB (ref 70–99)
POTASSIUM: 3.6 mmol/L (ref 3.5–5.1)
Sodium: 132 mmol/L — ABNORMAL LOW (ref 135–145)

## 2018-11-26 LAB — CBC WITH DIFFERENTIAL/PLATELET
ABS IMMATURE GRANULOCYTES: 0.05 10*3/uL (ref 0.00–0.07)
BASOS ABS: 0 10*3/uL (ref 0.0–0.1)
BASOS PCT: 0 %
EOS ABS: 0 10*3/uL (ref 0.0–0.5)
Eosinophils Relative: 0 %
HCT: 51.8 % (ref 39.0–52.0)
Hemoglobin: 17.3 g/dL — ABNORMAL HIGH (ref 13.0–17.0)
Immature Granulocytes: 1 %
LYMPHS ABS: 1.2 10*3/uL (ref 0.7–4.0)
Lymphocytes Relative: 15 %
MCH: 29.1 pg (ref 26.0–34.0)
MCHC: 33.4 g/dL (ref 30.0–36.0)
MCV: 87.2 fL (ref 80.0–100.0)
MONOS PCT: 11 %
Monocytes Absolute: 0.8 10*3/uL (ref 0.1–1.0)
NEUTROS ABS: 5.8 10*3/uL (ref 1.7–7.7)
NRBC: 0 % (ref 0.0–0.2)
Neutrophils Relative %: 73 %
PLATELETS: 190 10*3/uL (ref 150–400)
RBC: 5.94 MIL/uL — ABNORMAL HIGH (ref 4.22–5.81)
RDW: 13.5 % (ref 11.5–15.5)
WBC: 7.9 10*3/uL (ref 4.0–10.5)

## 2018-11-26 LAB — PROTIME-INR
INR: 2.75
Prothrombin Time: 28.7 seconds — ABNORMAL HIGH (ref 11.4–15.2)

## 2018-11-26 MED ORDER — PREDNISONE 20 MG PO TABS
60.0000 mg | ORAL_TABLET | Freq: Once | ORAL | Status: AC
  Administered 2018-11-26: 60 mg via ORAL
  Filled 2018-11-26: qty 3

## 2018-11-26 NOTE — ED Notes (Signed)
Patient verbalizes understanding of discharge instructions. Opportunity for questioning and answers were provided. Armband removed by staff, pt discharged from ED.  

## 2018-11-26 NOTE — ED Provider Notes (Signed)
[place] EMERGENCY DEPARTMENT Provider Note   CSN: [id number] Arrival date & time: 11/26/18  [place]     History   Chief Complaint Chief Complaint  Patient presents with  . Leg Swelling    HPI Jordan [name]. is a 72 y.o. male.  Patient is a 72 year old male who presents with swelling to his left foot and ankle.  States is been going on for about a week.  Seem to start in his ankle and now is in his foot.  He denies any swelling to his lower leg.  No calf pain.  He does have some diffuse tenderness over the dorsum of the left foot.  He states he has had issues with swelling similarly a couple times in the past but it usually only last a day or 2.  He denies any known injuries to the joint.  He denies any other joint issues.  No known history of gout.  No chest pain or shortness of breath.  He does have a history of A. fib and is on Coumadin.     Past Medical History:  Diagnosis Date  . Atrial fib/flutter, transient    Hx  . Atrial fibrillation (HCC)    coumadin  . Colon cancer [facility])    family hx  . Diverticulosis   . Hemorrhoids   . Hepatitis C   . History of DVT (deep vein thrombosis)   . History of nuclear stress test 04/2002   exercise; normal study   . Hypertension   . 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 Personal history of colonic polyps   . Prostate cancer [facility])    radical prostatectomy     Patient Active Problem List   Diagnosis Date Noted  . Screening for lipid disorders 05/03/2018  . NICM (nonischemic cardiomyopathy) ([place]) 02/15/2017  . Chronic systolic heart failure ([place]) 02/15/2017  . Dyspnea 01/07/2017  . Essential hypertension 01/07/2017  . Atrial fibrillation ([place]) 03/15/2013  . Long term current use of anticoagulant therapy 03/15/2013  . History of prostate cancer 02/16/2013    Past Surgical History:  Procedure Laterality Date  . CARDIAC CATHETERIZATION  2007   no occlusive CAD  .  COLONOSCOPY  [id number]   [name]  . PROSTATECTOMY    . PROSTATECTOMY  04-04   Dr. Risa Grill  . TRANSTHORACIC ECHOCARDIOGRAM  05/2006   EF ~97%; LV systolic function normal; mild focal basal septal hypertrophy; AV thickness mildly increased; LA mod-markedly dilate        Home Medications    Prior to Admission medications   Medication Sig Start Date End Date Taking? Authorizing Provider  aspirin 81 MG chewable tablet Chew 81 mg by mouth daily.    [provider]  carvedilol (COREG) 25 MG tablet Take 1 tablet (25 mg total) by mouth 2 (two) times daily with a meal. 05/05/18   Hilty, Nadean Corwin, MD  losartan-hydrochlorothiazide (HYZAAR) 50-12.5 MG tablet Take 1 tablet by mouth daily. 05/05/18   Pixie Casino, MD  warfarin (COUMADIN) 7.5 MG tablet TAKE 1 TABLET BY MOUTH DAILY AS DIRECTED BY COUMADIN CLINIC 10/20/18   Hilty, Nadean Corwin, MD    Family History Family History  Problem Relation Age of Onset  . Brain cancer Mother        brain tumor  . Stroke Father     Social History Social History   Tobacco Use  . Smoking status: Never Smoker  . Smokeless tobacco:  Never Used  Substance Use Topics  . Alcohol use: Yes    Comment: occasional   . Drug use: No     Allergies   Patient has no known allergies.   Review of Systems Review of Systems  Constitutional: Negative for fever.  Gastrointestinal: Negative for nausea and vomiting.  Musculoskeletal: Positive for arthralgias and joint swelling. Negative for back pain and neck pain.  Skin: Negative for wound.  Neurological: Negative for weakness, numbness and headaches.     Physical Exam Updated Vital Signs BP 109/85   Pulse (!) 45   Temp 97.9 F (36.6 C) (Oral)   Ht 6' (1.829 m)   Wt 101.6 kg   SpO2 100%   BMI 30.38 kg/m   Physical Exam  Constitutional: He is oriented to person, place, and time. He appears well-developed and well-nourished.  HENT:  Head: Normocephalic and atraumatic.  Neck: Normal  range of motion. Neck supple.  Cardiovascular: Normal rate.  Pulmonary/Chest: Effort normal.  Musculoskeletal: He exhibits edema and tenderness.  Patient has mild diffuse swelling to the left foot and ankle.  There is no swelling past the ankle into the lower leg or knee.  There is generalized tenderness and palpation of the dorsum of the foot.  No deformity is noted.  No warmth or erythema is noted.  Pedal pulses are intact.  He has normal sensation and motor function distally.  Neurological: He is alert and oriented to person, place, and time.  Skin: Skin is warm and dry.  Psychiatric: He has a normal mood and affect.     ED Treatments / Results  Labs (all labs ordered are listed, but only abnormal results are displayed) Labs Reviewed  PROTIME-INR - Abnormal; Notable for the following components:      Result Value   Prothrombin Time 28.7 (*)    All other components within normal limits  BASIC METABOLIC PANEL - Abnormal; Notable for the following components:   Sodium 132 (*)    CO2 15 (*)    Glucose, Bld 167 (*)    Calcium 8.8 (*)    GFR calc non Af Amer 58 (*)    All other components within normal limits  CBC WITH DIFFERENTIAL/PLATELET - Abnormal; Notable for the following components:   RBC 5.94 (*)    Hemoglobin 17.3 (*)    All other components within normal limits    EKG None  Radiology Dg Foot Complete Left  Result Date: 11/26/2018 CLINICAL DATA:  Foot pain, swelling EXAM: LEFT FOOT - COMPLETE 3+ VIEW COMPARISON:  04/11/2017 FINDINGS: Diffuse dense vascular calcifications. Mild degenerative changes at the 1st MTP joint with joint space narrowing and spurring. Plantar calcaneal spur. No acute bony abnormality. Specifically, no fracture, subluxation, or dislocation. IMPRESSION: No acute bony abnormality. Diffuse vascular calcifications. Electronically Signed   By: Rolm Baptise M.D.   On: 11/26/2018 19:28    Procedures Procedures (including critical care  time)  Medications Ordered in ED Medications  predniSONE (DELTASONE) tablet 60 mg (has no administration in time range)     Initial Impression / Assessment and Plan / ED Course  I have reviewed the triage vital signs and the nursing notes.  Pertinent labs & imaging results that were available during my care of the patient were reviewed by me and considered in my medical decision making (see chart for details).     Patient presents with swelling to his left foot and ankle.  I suspect its due to arthritic changes.  There  is no suggestions of infection.  His x-ray shows some arthritis and calcifications of his vessels but otherwise no acute findings.  His INR is therapeutic.  I have a low suspicion of DVT but he has had a history of DVT in the past and I think that it is prudent to do a vascular ultrasound.  Were unable to do that today and I have ordered it for tomorrow.  He was given 1 dose of prednisone in the ED.  I did not put him on a longer course of prednisone given that his blood sugar is already elevated.  He is not a known diabetic.  He was encouraged to use a compression stocking and follow-up with his primary care physician.  I did advise him that he will need to have his Leukos followed by his primary care physician.  He will use over-the-counter medications for symptomatic relief.  Return precautions were given.  Final Clinical Impressions(s) / ED Diagnoses   Final diagnoses:  Leg swelling    ED Discharge Orders         Ordered    VAS [place] LOWER EXTREMITY VENOUS (DVT)     11/26/18 1954           Malvin Johns, MD 11/26/18 1956

## 2018-11-26 NOTE — Discharge Instructions (Signed)
Your x-ray shows some arthritic changes and also some calcifications of the vessels in your foot.  You need to use a compression stocking.  Keep your leg elevated at home.  Your blood glucose was elevated at 167 and this needs to be rechecked by her primary care physician.  Return tomorrow to get an ultrasound of your leg.  You need to make a follow-up appointment with your primary care physician within the next few days.  Return here if you have any worsening symptoms.

## 2018-11-26 NOTE — ED Triage Notes (Signed)
Patient c/o left foot swelling for 5 days. No injury noted. No fevers, sob, CP. Has tried tylenol, ibuprofen, ice and elevation with no relief.

## 2018-11-27 ENCOUNTER — Ambulatory Visit (HOSPITAL_COMMUNITY)
Admission: RE | Admit: 2018-11-27 | Discharge: 2018-11-27 | Disposition: A | Discharge status: Home / Self Care | Payer: Medicare Other | Source: Ambulatory Visit | Attending: Emergency Medicine | Admitting: Emergency Medicine

## 2018-11-27 DIAGNOSIS — M7989 Other specified soft tissue disorders: Secondary | ICD-10-CM | POA: Diagnosis present

## 2018-11-27 DIAGNOSIS — I82552 Chronic embolism and thrombosis of left peroneal vein: Secondary | ICD-10-CM | POA: Insufficient documentation

## 2018-11-27 DIAGNOSIS — M79609 Pain in unspecified limb: Secondary | ICD-10-CM | POA: Diagnosis not present

## 2018-11-27 NOTE — Progress Notes (Signed)
VASCULAR LAB PRELIMINARY  PRELIMINARY  PRELIMINARY  PRELIMINARY  Left lower extremity venous duplex completed.    Preliminary report:  There is no acute DVT noted in the left lower extremity.  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  Darren Caldron, RVT 11/27/2018, 8:55 AM

## 2018-12-04 ENCOUNTER — Ambulatory Visit: Payer: Medicare Other | Admitting: Family Medicine

## 2018-12-04 ENCOUNTER — Encounter: Payer: Self-pay | Admitting: Family Medicine

## 2018-12-04 VITALS — BP 128/80 | HR 52 | Temp 97.6°F | Wt 216.2 lb

## 2018-12-04 DIAGNOSIS — M79672 Pain in left foot: Secondary | ICD-10-CM | POA: Diagnosis not present

## 2018-12-04 DIAGNOSIS — Z23 Encounter for immunization: Secondary | ICD-10-CM | POA: Diagnosis not present

## 2018-12-04 MED ORDER — COLCHICINE 0.6 MG PO TABS: 0.6000 mg | ORAL_TABLET | Freq: Every day | ORAL | 0 refills | Status: DC

## 2018-12-04 NOTE — Patient Instructions (Signed)

## 2018-12-04 NOTE — Progress Notes (Signed)
   Subjective:    Patient ID: Jordan [name]., male    DOB: Feb 15, 1946, 72 y.o.   MRN: [id number]  HPI He is here after a several year absence.  He is here for follow-up after recent visit to the emergency room for evaluation of left lower leg swelling.  The emergency room record was reviewed.  He states that he has had intermittent difficulty with foot and toe swelling.  Most recent one is recorded in the ER record.  Presently he is on Coumadin.  He was also given a steroid during that ER visit.   Review of Systems     Objective:   Physical Exam Alert and in no distress.  Left ankle does show swelling medially and laterally.  Also exquisitely tender over the DIP joint of the great toe.  Is not hot or red.      Assessment & Plan:  Left foot pain - Plan: colchicine 0.6 MG tablet  Need for influenza vaccination - Plan: Flu vaccine HIGH DOSE PF (Fluzone High dose) I think his symptoms are more consistent with gout but at this point doing blood work would not be accurate.  I will give him colchicine as he does have underlying A. fib.  He is also to set up for complete examination.  He will keep me informed as to how the colchicine is working.

## 2018-12-05 ENCOUNTER — Telehealth: Payer: Self-pay | Admitting: Family Medicine

## 2018-12-05 NOTE — Telephone Encounter (Signed)
Recv'd fax from [facility] that Colchicine not covered, called pharmacy & went thru as [facility] and pt picked up

## 2018-12-07 ENCOUNTER — Other Ambulatory Visit: Payer: Self-pay

## 2018-12-07 ENCOUNTER — Ambulatory Visit (HOSPITAL_COMMUNITY): Payer: Medicare Other | Attending: Cardiovascular Disease

## 2018-12-07 DIAGNOSIS — I428 Other cardiomyopathies: Secondary | ICD-10-CM

## 2018-12-15 ENCOUNTER — Other Ambulatory Visit: Payer: Self-pay | Admitting: Internal Medicine

## 2018-12-15 DIAGNOSIS — I4819 Other persistent atrial fibrillation: Secondary | ICD-10-CM

## 2018-12-22 ENCOUNTER — Ambulatory Visit (INDEPENDENT_AMBULATORY_CARE_PROVIDER_SITE_OTHER): Payer: Medicare Other | Admitting: Pharmacist

## 2018-12-22 DIAGNOSIS — Z7901 Long term (current) use of anticoagulants: Secondary | ICD-10-CM

## 2018-12-22 DIAGNOSIS — I4821 Permanent atrial fibrillation: Secondary | ICD-10-CM | POA: Diagnosis not present

## 2018-12-22 DIAGNOSIS — I4891 Unspecified atrial fibrillation: Secondary | ICD-10-CM

## 2018-12-22 LAB — POCT INR: INR: 1.1 — AB (ref 2.0–3.0)

## 2019-01-09 ENCOUNTER — Encounter: Payer: Self-pay | Admitting: Family Medicine

## 2019-01-09 ENCOUNTER — Ambulatory Visit (INDEPENDENT_AMBULATORY_CARE_PROVIDER_SITE_OTHER): Payer: Medicare Other | Admitting: Family Medicine

## 2019-01-09 VITALS — BP 126/82 | HR 60 | Temp 97.5°F | Ht 72.0 in | Wt 212.8 lb

## 2019-01-09 DIAGNOSIS — H02826 Cysts of left eye, unspecified eyelid: Secondary | ICD-10-CM

## 2019-01-09 DIAGNOSIS — I4821 Permanent atrial fibrillation: Secondary | ICD-10-CM

## 2019-01-09 DIAGNOSIS — Z800 Family history of malignant neoplasm of digestive organs: Secondary | ICD-10-CM

## 2019-01-09 DIAGNOSIS — M79672 Pain in left foot: Secondary | ICD-10-CM | POA: Diagnosis not present

## 2019-01-09 DIAGNOSIS — Z13220 Encounter for screening for lipoid disorders: Secondary | ICD-10-CM

## 2019-01-09 DIAGNOSIS — I10 Essential (primary) hypertension: Secondary | ICD-10-CM

## 2019-01-09 DIAGNOSIS — Z7901 Long term (current) use of anticoagulants: Secondary | ICD-10-CM

## 2019-01-09 DIAGNOSIS — I5022 Chronic systolic (congestive) heart failure: Secondary | ICD-10-CM

## 2019-01-09 DIAGNOSIS — N5239 Other post-surgical erectile dysfunction: Secondary | ICD-10-CM | POA: Diagnosis not present

## 2019-01-09 DIAGNOSIS — Z0000 Encounter for general adult medical examination without abnormal findings: Secondary | ICD-10-CM | POA: Diagnosis not present

## 2019-01-09 DIAGNOSIS — R739 Hyperglycemia, unspecified: Secondary | ICD-10-CM

## 2019-01-09 DIAGNOSIS — Z8546 Personal history of malignant neoplasm of prostate: Secondary | ICD-10-CM

## 2019-01-09 DIAGNOSIS — I428 Other cardiomyopathies: Secondary | ICD-10-CM

## 2019-01-09 LAB — POCT GLYCOSYLATED HEMOGLOBIN (HGB A1C): Hemoglobin A1C: 5.2 % (ref 4.0–5.6)

## 2019-01-09 MED ORDER — SILDENAFIL CITRATE 20 MG PO TABS: ORAL_TABLET | ORAL | 2 refills | Status: DC

## 2019-01-09 NOTE — Progress Notes (Signed)
Jordan [name]. is a 73 y.o. male who presents for annual wellness visit,CPE and follow-up on chronic medical conditions.  He has a history of atrial fibrillation and sees Dr. Debara Pickett for this.  He is on Coumadin and has been stable on that for several years.  He also has an underlying cardiomyopathy that is being handled by Dr. Debara Pickett.  Continues to do quite nicely on losartan/HCTZ as well as Coreg.  He also is taking aspirin.  He has had recent difficulty with foot pain and was given colchicine which indeed did help.  He has a previous history of prostate cancer and has had some difficulty with erectile dysfunction.  He has used sildenafil in the past and would like to use it again.  He indicates that he does have a family history of colon cancer.  Apparently his mother had this in her 21s.  He has had a previous colonoscopy although this was several years ago and it was unknown at that time that there was a colon cancer history risk. He is now retired and is starting to exercise regularly.  He does have a lesion to the lateral aspect of his left thigh that is causing him some concern but no pain or drainage.  Recently he has lost several pounds in an effort to make a change in his lifestyle especially regarding his elevated blood sugar.  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  Immunizations and Health Maintenance Immunization History  Administered Date(s) Administered  . DT 02/05/2003  . Influenza, High Dose Seasonal PF 10/31/2013, 01/06/2015, 12/04/2018  . Pneumococcal Conjugate-13 01/06/2015  . Pneumococcal Polysaccharide-23 10/31/2013  . Tdap 10/31/2013   Health Maintenance Due  Topic Date Due  . COLONOSCOPY  08/09/2016    Last colonoscopy:08/09/2006 Last PSA:2004 Dentist: over a year Ophtho:over a year Exercise:gym  Other doctors caring for patient include: Dr. Debara Pickett cardio   Advanced Directives:No ,info given    Depression screen:  See  questionnaire below.     Depression screen [facility] 2/9 01/09/2019 01/06/2015 10/31/2013  Decreased Interest 1 0 0  Down, Depressed, Hopeless 0 0 0  PHQ - 2 Score 1 0 0    Fall Screen: See Questionaire below.   Fall Risk  01/09/2019 01/06/2015 10/31/2013  Falls in the past year? 0 No No    ADL screen:  See questionnaire below.  Functional Status Survey: Does the patient have difficulty seeing, even when wearing glasses/contacts?: No Does the patient have difficulty concentrating, remembering, or making decisions?: No Does the patient have difficulty walking or climbing stairs?: No Does the patient have difficulty dressing or bathing?: No Does the patient have difficulty doing errands alone such as visiting a doctor's office or shopping?: No   Review of Systems  Constitutional: -, -unexpected weight change, -anorexia, -fatigue Allergy: -sneezing, -itching, -congestion Dermatology: denies changing moles, rash, lumps ENT: -runny nose, -ear pain, -sore throat,  Cardiology:  -chest pain, -palpitations, -orthopnea, Respiratory: -cough, -shortness of breath, -dyspnea on exertion, -wheezing,  Gastroenterology: -abdominal pain, -nausea, -vomiting, -diarrhea, -constipation, -dysphagia Hematology: -bleeding or bruising problems Musculoskeletal: -arthralgias, -myalgias, -joint swelling, -back pain, - Ophthalmology: -vision changes,  Urology: -dysuria, -difficulty urinating,  -urinary frequency, -urgency, incontinence Neurology: -, -numbness, , -memory loss, -falls, -dizziness    PHYSICAL EXAM:   General Appearance: Alert, cooperative, no distress, appears stated age Head: Normocephalic, without obvious abnormality, atraumatic Eyes: PERRL, conjunctiva/corneas clear, EOM's intact, fundi benign.  1 cm smooth round nontender lesion noted to the  lateral aspect of the left lower eyelid. Ears: Normal TM's and external ear canals Nose: Nares normal, mucosa normal, no drainage or sinus    tenderness Throat: Lips, mucosa, and tongue normal; teeth and gums normal Neck: Supple, no lymphadenopathy, thyroid:no enlargement/tenderness/nodules; no carotid bruit or JVD Lungs: Clear to auscultation bilaterally without wheezes, rales or ronchi; respirations unlabored Heart: Regular rate and rhythm, S1 and S2 normal, no murmur, rub or gallop Abdomen: Soft, non-tender, nondistended, normoactive bowel sounds, no masses, no hepatosplenomegaly Extremities: No clubbing, cyanosis or edema Pulses: 2+ and symmetric all extremities Skin: Skin color, texture, turgor normal, no rashes or lesions Lymph nodes: Cervical, supraclavicular, and axillary nodes normal Neurologic: CNII-XII intact, normal strength, sensation and gait; reflexes 2+ and symmetric throughout   Psych: Normal mood, affect, hygiene and grooming A1c is 5.2 ASSESSMENT/PLAN: Routine general medical examination at a health care facility  Family history of colon cancer in mother - In her early 62's - Plan: Ambulatory referral to Gastroenterology  Post-procedural erectile dysfunction, unspecified type - Plan: sildenafil (REVATIO) 20 MG tablet  Left foot pain - Plan: Uric Acid  Permanent atrial fibrillation  Long term current use of anticoagulant therapy  NICM (nonischemic cardiomyopathy) (HCC)  Chronic systolic heart failure (HCC)  Essential hypertension  History of prostate cancer  Screening for lipid disorders  Elevated blood sugar - Plan: POCT glycosylated hemoglobin (Hb A1C)  Cyst of eyelid, left An 11 blade was used to I&D the cystic lesion.  Clear fluid spontaneously drained from this. Instructions on proper use of sildenafil and possible side effects was given. He will continue on his other medications and I will check a uric acid level. He will continue to see cardiology. Discussed diet and exercise with him and encouraged him to continue with his present exercise regimen.  Immunization recommendations  discussed.  Colonoscopy recommendations reviewed. No further intervention needed on the eye cyst.  Medicare Attestation I have personally reviewed: The patient's medical and social history Their use of alcohol, tobacco or illicit drugs Their current medications and supplements The patient's functional ability including ADLs,fall risks, home safety risks, cognitive, and hearing and visual impairment Diet and physical activities Evidence for depression or mood disorders  The patient's weight, height, and BMI have been recorded in the chart.  I have made referrals, counseling, and provided education to the patient based on review of the above and I have provided the patient with a written personalized care plan for preventive services.     Jill Alexanders, MD   01/09/2019

## 2019-01-10 LAB — URIC ACID: URIC ACID: 10.4 mg/dL — AB (ref 3.7–8.6)

## 2019-01-12 ENCOUNTER — Ambulatory Visit (INDEPENDENT_AMBULATORY_CARE_PROVIDER_SITE_OTHER): Payer: Medicare Other | Admitting: *Deleted

## 2019-01-12 DIAGNOSIS — I4821 Permanent atrial fibrillation: Secondary | ICD-10-CM | POA: Diagnosis not present

## 2019-01-12 DIAGNOSIS — Z7901 Long term (current) use of anticoagulants: Secondary | ICD-10-CM

## 2019-01-12 DIAGNOSIS — I4891 Unspecified atrial fibrillation: Secondary | ICD-10-CM | POA: Diagnosis not present

## 2019-01-12 LAB — POCT INR: INR: 2.2 (ref 2.0–3.0)

## 2019-01-16 ENCOUNTER — Ambulatory Visit (INDEPENDENT_AMBULATORY_CARE_PROVIDER_SITE_OTHER): Payer: Medicare Other | Admitting: Gastroenterology

## 2019-01-16 ENCOUNTER — Encounter: Payer: Self-pay | Admitting: Gastroenterology

## 2019-01-16 ENCOUNTER — Telehealth: Payer: Self-pay

## 2019-01-16 VITALS — BP 134/78 | HR 82 | Ht 72.0 in | Wt 211.0 lb

## 2019-01-16 DIAGNOSIS — Z860100 Personal history of colon polyps, unspecified: Secondary | ICD-10-CM | POA: Insufficient documentation

## 2019-01-16 DIAGNOSIS — Z800 Family history of malignant neoplasm of digestive organs: Secondary | ICD-10-CM | POA: Diagnosis not present

## 2019-01-16 DIAGNOSIS — Z7901 Long term (current) use of anticoagulants: Secondary | ICD-10-CM | POA: Diagnosis not present

## 2019-01-16 MED ORDER — PEG 3350-KCL-NA BICARB-NACL 420 G PO SOLR: 4000.0000 mL | Freq: Once | ORAL | 0 refills | Status: AC

## 2019-01-16 NOTE — Patient Instructions (Signed)

## 2019-01-16 NOTE — Progress Notes (Signed)
I agree with the above note, plan 

## 2019-01-16 NOTE — Progress Notes (Addendum)
01/16/2019 Jordan [name] [id number] 08/04/1946   HISTORY OF PRESENT ILLNESS: This is a pleasant 73 year old male who is known to Dr. Ardis Hughs for colonoscopy in 2007.  At that time he was found to have diverticulosis, external hemorrhoids, 2 small polyps that were removed were hyperplastic on pathology.  He has not been seen here since that time.  He also recently found out that his mother had colon cancer diagnosed around age 69.  He is here today to discuss another colonoscopy.  He is on Coumadin for atrial fibrillation and follows with Dr. Debara Pickett for that.  He admits to very occasional constipation, but otherwise does not have any bowel complaints including rectal bleeding, abdominal pain.  Says that his appetite is good.   Past Medical History:  Diagnosis Date  . Atrial fib/flutter, transient    Hx  . Atrial fibrillation (HCC)    coumadin  . Colon cancer [facility])    family hx  . Diverticulosis   . Gout   . Hemorrhoids   . Hepatitis C   . History of DVT (deep vein thrombosis)   . History of nuclear stress test 04/2002   exercise; normal study   . Hypertension   . 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 Personal history of colonic polyps   . Prostate cancer [facility])    radical prostatectomy    Past Surgical History:  Procedure Laterality Date  . CARDIAC CATHETERIZATION  2007   no occlusive CAD  . COLONOSCOPY  0277,4128   [name]  . PROSTATECTOMY    . PROSTATECTOMY  04-04   Dr. Risa Grill  . TRANSTHORACIC ECHOCARDIOGRAM  05/2006   EF ~78%; LV systolic function normal; mild focal basal septal hypertrophy; AV thickness mildly increased; LA mod-markedly dilate    reports that he has never smoked. He has never used smokeless tobacco. He reports current alcohol use of about 3.0 standard drinks of alcohol per week. He reports that he does not use drugs. family history includes Brain cancer in his mother; Stroke in his father. No Known  Allergies    Outpatient Encounter Medications as of 01/16/2019  Medication Sig  . aspirin 81 MG chewable tablet Chew 81 mg by mouth daily.  . carvedilol (COREG) 25 MG tablet Take 1 tablet (25 mg total) by mouth 2 (two) times daily with a meal.  . colchicine 0.6 MG tablet Take 1 tablet (0.6 mg total) by mouth daily. (Patient taking differently: Take 0.6 mg by mouth daily. As needed for gout attacks)  . losartan-hydrochlorothiazide (HYZAAR) 50-12.5 MG tablet Take 1 tablet by mouth daily.  . sildenafil (REVATIO) 20 MG tablet Take 1 to 5 pills as needed  . warfarin (COUMADIN) 7.5 MG tablet TAKE 1 TABLET BY MOUTH DAILY AS DIRECTED BY COUMADIN CLINIC   No facility-administered encounter medications on file as of 01/16/2019.      REVIEW OF SYSTEMS  : All other systems reviewed and negative except where noted in the History of Present Illness.   PHYSICAL EXAM: BP 134/78   Pulse 82   Ht 6' (1.829 m)   Wt 211 lb (95.7 kg)   BMI 28.62 kg/m  General: Well developed black male in no acute distress Head: Normocephalic and atraumatic Eyes:  Sclerae anicteric, conjunctiva pink. Ears: Normal auditory acuity Lungs: Clear throughout to auscultation; no increased WOB. Heart:  Irregularly irregular but rate ok. Abdomen: Soft, non-distended.  BS present.  Non-tender. Rectal:  Will be  done at the time of colonoscopy. Musculoskeletal: Symmetrical with no gross deformities  Skin: No lesions on visible extremities Extremities: No edema  Neurological: Alert oriented x 4, grossly non-focal Psychological:  Alert and cooperative. Normal mood and affect  ASSESSMENT AND PLAN: *Personal history of colon cancer in his mother diagnosed around age 7.  This is new information to him over the past several months. *Personal history of colon polyps:  Hyperplastic polyps in 2007. *Chronic anticoagulation with coumadin for history of atrial fibrillation:  Will hold coumadin for 5 days prior to endoscopic procedures  - will instruct when and how to resume after procedure. Benefits and risks of procedure explained including risks of bleeding, perforation, infection, missed lesions, reactions to medications and possible need for hospitalization and surgery for complications. Additional rare but real risk of stroke or other vascular clotting events off of Coumadin also explained and need to seek urgent help if any signs of these problems occur. Will communicate by phone or EMR with patient's prescribing provider, Dr. Debara Pickett, to confirm that holding Coumadin is reasonable in this case.    CC:  Denita Lung, MD

## 2019-01-16 NOTE — Telephone Encounter (Signed)
[facility] Pre-operative Risk Assessment     Request for surgical clearance:     Endoscopy Procedure  What type of surgery is being performed?     colonoscopy  When is this surgery scheduled?     02/14/2019  What type of clearance is required ?   Pharmacy  Are there any medications that need to be held prior to surgery and how long? Coumadin/5 days  Practice name and name of physician performing surgery?      [facility]  What is your office phone and fax number?      Phone- [phone]  [phone]  Anesthesia type (None, local, MAC, general) ?       MAC

## 2019-01-18 NOTE — Telephone Encounter (Signed)
Pt takes warfarin for afib with CHADS2VASc score of 4 (age, HTN, CHF, PAD (foot scan 11/26/18 with dense vascular calcifications)), also with history of remote DVT. Ok to hold warfarin for 5 days prior to procedure.

## 2019-01-18 NOTE — Telephone Encounter (Signed)
   Primary Cardiologist: Pixie Casino, MD  Chart reviewed as part of pre-operative protocol coverage.   Per pharmacy recommendations, patient can hold his coumadin for 5 days prior to his upcoming colonoscopy. He should restart coumadin when cleared to do so by his gastroenterologist.   I will route this recommendation to the requesting party via [place] fax function and remove from pre-op pool.  Please call with questions.  Abigail Butts, PA-C 01/18/2019, 3:48 PM

## 2019-01-19 ENCOUNTER — Telehealth: Payer: Self-pay

## 2019-01-19 NOTE — Telephone Encounter (Signed)
Spoke with patient and told him that per Dr. Debara Pickett, he could hold his Coumadin for 5 days prior to his procedure.  Patient agreed.

## 2019-01-26 ENCOUNTER — Ambulatory Visit (INDEPENDENT_AMBULATORY_CARE_PROVIDER_SITE_OTHER): Payer: Medicare Other | Admitting: *Deleted

## 2019-01-26 DIAGNOSIS — I4891 Unspecified atrial fibrillation: Secondary | ICD-10-CM | POA: Diagnosis not present

## 2019-01-26 DIAGNOSIS — Z7901 Long term (current) use of anticoagulants: Secondary | ICD-10-CM | POA: Diagnosis not present

## 2019-01-26 LAB — POCT INR: INR: 3.1 — AB (ref 2.0–3.0)

## 2019-01-26 NOTE — Patient Instructions (Signed)
Description   Start taking 1 tablet daily except for 1/2 tablet each Monday and Friday.  Take your last dose on 02/08/19. Restart per GI doctor's instructions if ok'd take an extra 1/2 tablet with normal dose for 2 days then resume regular dosage. Repeat INR in 2 weeks

## 2019-02-02 ENCOUNTER — Encounter: Payer: Self-pay | Admitting: Gastroenterology

## 2019-02-14 ENCOUNTER — Ambulatory Visit (AMBULATORY_SURGERY_CENTER): Payer: Medicare Other | Admitting: Gastroenterology

## 2019-02-14 ENCOUNTER — Encounter: Payer: Self-pay | Admitting: Gastroenterology

## 2019-02-14 VITALS — BP 107/72 | HR 86 | Temp 95.7°F | Resp 22 | Ht 72.0 in | Wt 211.0 lb

## 2019-02-14 DIAGNOSIS — D123 Benign neoplasm of transverse colon: Secondary | ICD-10-CM

## 2019-02-14 DIAGNOSIS — D124 Benign neoplasm of descending colon: Secondary | ICD-10-CM

## 2019-02-14 DIAGNOSIS — K5730 Diverticulosis of large intestine without perforation or abscess without bleeding: Secondary | ICD-10-CM

## 2019-02-14 DIAGNOSIS — Z860100 Personal history of colon polyps, unspecified: Secondary | ICD-10-CM

## 2019-02-14 MED ORDER — SODIUM CHLORIDE 0.9 % IV SOLN: 500.0000 mL | Freq: Once | INTRAVENOUS | Status: DC

## 2019-02-14 NOTE — Progress Notes (Signed)
Patient instructed to take his coumadin today.

## 2019-02-14 NOTE — Progress Notes (Signed)
Report to PACU, RN, vss, BBS= Clear.  

## 2019-02-14 NOTE — Progress Notes (Signed)
Pt's states no medical or surgical changes since previsit or office visit. 

## 2019-02-14 NOTE — Telephone Encounter (Signed)
Opened this chart by error

## 2019-02-14 NOTE — Progress Notes (Signed)
Called to room to assist during endoscopic procedure.  Patient ID and intended procedure confirmed with present staff. Received instructions for my participation in the procedure from the performing physician.  

## 2019-02-14 NOTE — Op Note (Addendum)
[place] Patient Name: Jordan [name] Procedure Date: 02/14/2019 2:00 PM MRN: [id number] Endoscopist: Milus Banister , MD Age: 73 Referring MD:  Date of Birth: 10/04/1946 Gender: Male Account #: [id number] Procedure:                Colonoscopy Indications:              Screening for colorectal malignant neoplasm Medicines:                Monitored Anesthesia Care Procedure:                Pre-Anesthesia Assessment:                           - Prior to the procedure, a History and Physical                            was performed, and patient medications and                            allergies were reviewed. The patient's tolerance of                            previous anesthesia was also reviewed. The risks                            and benefits of the procedure and the sedation                            options and risks were discussed with the patient.                            All questions were answered, and informed consent                            was obtained. Prior Anticoagulants: The patient has                            taken Coumadin (warfarin), last dose was 5 days                            prior to procedure. ASA Grade Assessment: III - A                            patient with severe systemic disease. After                            reviewing the risks and benefits, the patient was                            deemed in satisfactory condition to undergo the                            procedure.  After obtaining informed consent, the colonoscope                            was passed under direct vision. Throughout the                            procedure, the patient's blood pressure, pulse, and                            oxygen saturations were monitored continuously. The                            Colonoscope was introduced through the anus and                            advanced to the the cecum, identified by                  appendiceal orifice and ileocecal valve. The                            colonoscopy was performed without difficulty. The                            patient tolerated the procedure well. The quality                            of the bowel preparation was good. The ileocecal                            valve, appendiceal orifice, and rectum were                            photographed. Scope In: 2:11:56 PM Scope Out: 2:25:54 PM Scope Withdrawal Time: 0 hours 11 minutes 10 seconds  Total Procedure Duration: 0 hours 13 minutes 58 seconds  Findings:                 Four sessile polyps were found in the descending                            colon and transverse colon. The polyps were 2 to 7                            mm in size. These polyps were removed with a cold                            snare. Resection and retrieval were complete.                           Multiple small and large-mouthed diverticula were                            found in the left colon.  The exam was otherwise without abnormality on                            direct and retroflexion views. Complications:            No immediate complications. Estimated blood loss:                            None. Estimated Blood Loss:     Estimated blood loss: none. Impression:               - Four 2 to 7 mm polyps in the descending colon and                            in the transverse colon, removed with a cold snare.                            Resected and retrieved.                           - Diverticulosis in the left colon.                           - The examination was otherwise normal on direct                            and retroflexion views. Recommendation:           - Patient has a contact number available for                            emergencies. The signs and symptoms of potential                            delayed complications were discussed with the                             patient. Return to normal activities tomorrow.                            Written discharge instructions were provided to the                            patient.                           - Resume previous diet.                           - Continue present medications. OK to resume your                            coumadin today.                           You will receive a letter within 2-3 weeks with the  pathology results and my final recommendations.                           If the polyp(s) is proven to be 'pre-cancerous' on                            pathology, you will need repeat colonoscopy in 3-5                            years. If the polyp(s) is NOT 'precancerous' on                            pathology then you should repeat colon cancer                            screening in 10 years with colonoscopy without need                            for colon cancer screening by any method prior to                            then (including stool testing). Milus Banister, MD 02/14/2019 2:29:03 PM This report has been signed electronically.

## 2019-02-14 NOTE — Patient Instructions (Addendum)
YOU HAD AN ENDOSCOPIC PROCEDURE TODAY AT THE [facility]:   Refer to the procedure report that was given to you for any specific questions about what was found during the examination.  If the procedure report does not answer your questions, please call your gastroenterologist to clarify.  If you requested that your care partner not be given the details of your procedure findings, then the procedure report has been included in a sealed envelope for you to review at your convenience later.  YOU SHOULD EXPECT: Some feelings of bloating in the abdomen. Passage of more gas than usual.  Walking can help get rid of the air that was put into your GI tract during the procedure and reduce the bloating. If you had a lower endoscopy (such as a colonoscopy or flexible sigmoidoscopy) you may notice spotting of blood in your stool or on the toilet paper. If you underwent a bowel prep for your procedure, you may not have a normal bowel movement for a few days.  Please Note:  You might notice some irritation and congestion in your nose or some drainage.  This is from the oxygen used during your procedure.  There is no need for concern and it should clear up in a day or so.  SYMPTOMS TO REPORT IMMEDIATELY:   Following lower endoscopy (colonoscopy or flexible sigmoidoscopy):  Excessive amounts of blood in the stool  Significant tenderness or worsening of abdominal pains  Swelling of the abdomen that is new, acute  Fever of 100F or higher   For urgent or emergent issues, a gastroenterologist can be reached at any hour by calling [phone].   DIET:  We do recommend a small meal at first, but then you may proceed to your regular diet.  Drink plenty of fluids but you should avoid alcoholic beverages for 24 hours. Try to increase the fiber in your diet, and drink plenty of water.  ACTIVITY:  You should plan to take it easy for the rest of today and you should NOT DRIVE or use heavy machinery until  tomorrow (because of the sedation medicines used during the test).    FOLLOW UP: Our staff will call the number listed on your records the next business day following your procedure to check on you and address any questions or concerns that you may have regarding the information given to you following your procedure. If we do not reach you, we will leave a message.  However, if you are feeling well and you are not experiencing any problems, there is no need to return our call.  We will assume that you have returned to your regular daily activities without incident.  If any biopsies were taken you will be contacted by phone or by letter within the next 1-3 weeks.  Please call us at [phone] if you have not heard about the biopsies in 3 weeks.    SIGNATURES/CONFIDENTIALITY: You and/or your care partner have signed paperwork which will be entered into your electronic medical record.  These signatures attest to the fact that that the information above on your After Visit Summary has been reviewed and is understood.  Full responsibility of the confidentiality of this discharge information lies with you and/or your care-partner.  Read all handouts given to you by your recovery room nurse. 

## 2019-02-15 ENCOUNTER — Telehealth: Payer: Self-pay

## 2019-02-15 NOTE — Telephone Encounter (Signed)
  Follow up Call-  Call back number 02/14/2019  Post procedure Call Back phone  # [phone]  Permission to leave phone message Yes  Some recent data might be hidden     Patient questions:  Do you have a fever, pain , or abdominal swelling? No. Pain Score  0 *  Have you tolerated food without any problems? Yes.    Have you been able to return to your normal activities? Yes.    Do you have any questions about your discharge instructions: Diet   No. Medications  No. Follow up visit  No.  Do you have questions or concerns about your Care? No.  Actions: * If pain score is 4 or above: No action needed, pain <4.

## 2019-02-15 NOTE — Telephone Encounter (Signed)
  Follow up Call-  Call back number 02/14/2019  Post procedure Call Back phone  # [phone]  Permission to leave phone message Yes  Some recent data might be hidden     Patient questions:  Do you have a fever, pain , or abdominal swelling? No. Pain Score  0 *  Have you tolerated food without any problems? Yes.    Have you been able to return to your normal activities? Yes.    Do you have any questions about your discharge instructions: Diet   No. Medications  No. Follow up visit  No.  Do you have questions or concerns about your Care? No.  Actions: * If pain score is 4 or above: No action needed, pain <4.

## 2019-02-16 ENCOUNTER — Other Ambulatory Visit: Payer: Self-pay | Admitting: Internal Medicine

## 2019-02-16 DIAGNOSIS — I4819 Other persistent atrial fibrillation: Secondary | ICD-10-CM

## 2019-02-21 ENCOUNTER — Ambulatory Visit (INDEPENDENT_AMBULATORY_CARE_PROVIDER_SITE_OTHER): Payer: Medicare Other | Admitting: Pharmacist Clinician (PhC)/ Clinical Pharmacy Specialist

## 2019-02-21 ENCOUNTER — Encounter: Payer: Self-pay | Admitting: Gastroenterology

## 2019-02-21 DIAGNOSIS — I4891 Unspecified atrial fibrillation: Secondary | ICD-10-CM

## 2019-02-21 DIAGNOSIS — Z7901 Long term (current) use of anticoagulants: Secondary | ICD-10-CM

## 2019-02-21 LAB — POCT INR: INR: 1.4 — AB (ref 2.0–3.0)

## 2019-03-07 ENCOUNTER — Ambulatory Visit (INDEPENDENT_AMBULATORY_CARE_PROVIDER_SITE_OTHER): Payer: Medicare Other | Admitting: Pharmacist

## 2019-03-07 ENCOUNTER — Other Ambulatory Visit: Payer: Self-pay

## 2019-03-07 DIAGNOSIS — I4891 Unspecified atrial fibrillation: Secondary | ICD-10-CM | POA: Diagnosis not present

## 2019-03-07 DIAGNOSIS — Z7901 Long term (current) use of anticoagulants: Secondary | ICD-10-CM | POA: Diagnosis not present

## 2019-03-07 LAB — POCT INR: INR: 4.4 — AB (ref 2.0–3.0)

## 2019-03-19 ENCOUNTER — Telehealth: Payer: Self-pay

## 2019-03-19 NOTE — Telephone Encounter (Signed)
lmom for prescreen and made aware of drive thru

## 2019-05-07 ENCOUNTER — Other Ambulatory Visit: Payer: Self-pay | Admitting: Internal Medicine

## 2019-05-28 ENCOUNTER — Other Ambulatory Visit: Payer: Self-pay | Admitting: Internal Medicine

## 2019-06-07 ENCOUNTER — Telehealth: Payer: Self-pay

## 2019-06-07 NOTE — Telephone Encounter (Signed)
lmom to schedule appt

## 2019-07-20 ENCOUNTER — Telehealth: Payer: Self-pay

## 2019-07-20 NOTE — Telephone Encounter (Signed)
LEFT A MESSAGE FOR THE PATIENT TO CALL THE OFFICE BACK TO GO OVER THE COVID PRESCREEN QUESTIONS

## 2019-08-07 ENCOUNTER — Ambulatory Visit (INDEPENDENT_AMBULATORY_CARE_PROVIDER_SITE_OTHER): Payer: Medicare Other | Admitting: Pharmacist Clinician (PhC)/ Clinical Pharmacy Specialist

## 2019-08-07 ENCOUNTER — Other Ambulatory Visit: Payer: Self-pay

## 2019-08-07 DIAGNOSIS — Z7901 Long term (current) use of anticoagulants: Secondary | ICD-10-CM | POA: Diagnosis not present

## 2019-08-07 DIAGNOSIS — I4891 Unspecified atrial fibrillation: Secondary | ICD-10-CM

## 2019-08-07 LAB — POCT INR: INR: 2.6 (ref 2.0–3.0)

## 2019-08-31 ENCOUNTER — Other Ambulatory Visit: Payer: Self-pay | Admitting: Internal Medicine

## 2019-08-31 DIAGNOSIS — I4819 Other persistent atrial fibrillation: Secondary | ICD-10-CM

## 2019-08-31 NOTE — Telephone Encounter (Signed)
Refill request

## 2019-10-04 ENCOUNTER — Telehealth: Payer: Self-pay | Admitting: Internal Medicine

## 2019-10-04 NOTE — Telephone Encounter (Signed)
Follow Up:   Pt said he received your message about his appt. He decided to keep the Virtual appointment. Pt say he needs to talk to you, still have some questions.

## 2019-10-04 NOTE — Telephone Encounter (Signed)
Patient reports fatigue and left ankle/foot swelling for about 2 months. He reports occasional wine intake, does consume some salt/sodium but tries to be careful of this, no soda intake.   Explained that one of his medications contains a diuretic. He reports swelling is SE of his medications. Explained that it would be unlikely that unilateral symptoms would be a side effect.   Asked that he check BP, HR, and weight before 10/16 virtual visit with MD  Meds are up to date  Routed to MD to review

## 2019-10-04 NOTE — Telephone Encounter (Signed)
LM for patient to call back concerning 10/12/2019 visit  MD is virtual clinic only this day Patient can keep appointment OR r/s for first available in-office  His INR check may need to be r/s as well, as it was to coincide with 9am MD visit

## 2019-10-04 NOTE — Telephone Encounter (Signed)
Will d/w him at virtual visit.  Dr Lemmie Evens

## 2019-10-12 ENCOUNTER — Telehealth (INDEPENDENT_AMBULATORY_CARE_PROVIDER_SITE_OTHER): Payer: Medicare Other | Admitting: Internal Medicine

## 2019-10-12 ENCOUNTER — Encounter: Payer: Self-pay | Admitting: Internal Medicine

## 2019-10-12 VITALS — BP 135/87 | Ht 72.0 in | Wt 225.0 lb

## 2019-10-12 DIAGNOSIS — I10 Essential (primary) hypertension: Secondary | ICD-10-CM

## 2019-10-12 DIAGNOSIS — Z7901 Long term (current) use of anticoagulants: Secondary | ICD-10-CM | POA: Diagnosis not present

## 2019-10-12 DIAGNOSIS — I428 Other cardiomyopathies: Secondary | ICD-10-CM

## 2019-10-12 DIAGNOSIS — I4811 Longstanding persistent atrial fibrillation: Secondary | ICD-10-CM

## 2019-10-12 DIAGNOSIS — I5022 Chronic systolic (congestive) heart failure: Secondary | ICD-10-CM

## 2019-10-12 DIAGNOSIS — I4819 Other persistent atrial fibrillation: Secondary | ICD-10-CM

## 2019-10-12 NOTE — Patient Instructions (Signed)
Medication Instructions:  Your physician recommends that you continue on your current medications as directed. Please refer to the Current Medication list given to you today.  *If you need a refill on your cardiac medications before your next appointment, please call your pharmacy*  Follow-Up: At [facility], you and your health needs are our priority.  As part of our continuing mission to provide you with exceptional heart care, we have created designated Provider Care Teams.  These Care Teams include your primary Cardiologist (physician) and Advanced Practice Providers (APPs -  Physician Assistants and Nurse Practitioners) who all work together to provide you with the care you need, when you need it.  Your next appointment:   12 month(s)  The format for your next appointment:   In Person  Provider:   You may see Kenneth C Hilty, MD or one of the following Advanced Practice Providers on your designated Care Team:    Hao Meng, PA-C  Angela Duke, PA-C or   Krista Kroeger, PA-C   Other Instructions   

## 2019-10-12 NOTE — Progress Notes (Signed)
Virtual Visit via Telephone Note   This visit type was conducted due to national recommendations for restrictions regarding the COVID-19 Pandemic (e.g. social distancing) in an effort to limit this patient's exposure and mitigate transmission in our community.  Due to his co-morbid illnesses, this patient is at least at moderate risk for complications without adequate follow up.  This format is felt to be most appropriate for this patient at this time.  The patient did not have access to video technology/had technical difficulties with video requiring transitioning to audio format only (telephone).  All issues noted in this document were discussed and addressed.  No physical exam could be performed with this format.  Please refer to the patient's chart for his  consent to telehealth for [facility].   Evaluation Performed: Telephone follow-up  Date:  10/12/2019   ID:  Jordan Kelch., DOB 1946/03/16, MRN [id number]  Patient Location:  [place] [address]  Provider location:   [address] [place], [place] [address]  PCP:  Denita Lung, MD  Cardiologist:  Pixie Casino, MD Electrophysiologist:  None   Chief Complaint: No complaints  History of Present Illness:    Jordan Dennie. is a 73 y.o. male who presents via audio/video conferencing for a telehealth visit today.  Jordan [name] returns today for follow-up.  Overall is doing well without complaints.  He remains in a persistent atrial fibrillation but has been rate controlled.  Heart rate and blood pressure is stable today.  His INRs have been well controlled.  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  Fortunately a repeat echo in December did show improvement in LVEF up to 60 to 65%, however previously his EF had been about 45 to 50%.  He does have severe concentric LVH.  He denies any chest pain or  worsening shortness of breath.  The patient does not have symptoms concerning for COVID-19 infection (fever, chills, cough, or new SHORTNESS OF BREATH).    Prior CV studies:   The following studies were reviewed today:  Chart reviewed Echo reviewed  PMHx:  Past Medical History:  Diagnosis Date  . Atrial fib/flutter, transient    Hx  . Atrial fibrillation (HCC)    coumadin  . Colon cancer [facility])    family hx  . Diverticulosis   . Gout   . Hemorrhoids   . Hepatitis C   . History of DVT (deep vein thrombosis)   . History of nuclear stress test 04/2002   exercise; normal study   . Hypertension   . 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 Personal history of colonic polyps   . Prostate cancer [facility])    radical prostatectomy     Past Surgical History:  Procedure Laterality Date  . CARDIAC CATHETERIZATION  2007   no occlusive CAD  . COLONOSCOPY  [id number]   [name]  . PROSTATECTOMY    . PROSTATECTOMY  04-04   Dr. Risa Grill  . TRANSTHORACIC ECHOCARDIOGRAM  05/2006   EF [id number]; LV systolic function normal; mild focal basal septal hypertrophy; AV thickness mildly increased; LA mod-markedly dilate    FAMHx:  Family History  Problem Relation Age of Onset  . Brain cancer Mother        brain tumor  . Colon cancer Mother   . Stroke Father   . Rectal cancer  Neg Hx   . Stomach cancer Neg Hx   . Esophageal cancer Neg Hx     SOCHx:   reports that he has never smoked. He has never used smokeless tobacco. He reports current alcohol use of about 3.0 standard drinks of alcohol per week. He reports that he does not use drugs.  ALLERGIES:  No Known Allergies  MEDS:  Current Meds  Medication Sig  . aspirin 81 MG chewable tablet Chew 81 mg by mouth daily.  . carvedilol (COREG) 25 MG tablet TAKE 1 TABLET(25 MG) BY MOUTH TWICE DAILY WITH A MEAL  . losartan-hydrochlorothiazide (HYZAAR) 50-12.5 MG tablet TAKE 1 TABLET BY MOUTH DAILY  . sildenafil  (REVATIO) 20 MG tablet Take 1 to 5 pills as needed  . warfarin (COUMADIN) 7.5 MG tablet TAKE 1 TABLET BY MOUTH DAILY AS DIRECTED BY COUMADIN CLINIC     ROS: Pertinent items noted in HPI and remainder of comprehensive ROS otherwise negative.  Labs/Other Tests and Data Reviewed:    Recent Labs: 10/20/2018: ALT 39 11/26/2018: BUN 22; Creatinine, Ser 1.24; Hemoglobin 17.3; Platelets 190; Potassium 3.6; Sodium 132   Recent Lipid Panel Lab Results  Component Value Date/Time   CHOL 157 05/03/2018 10:17 AM   TRIG 69 05/03/2018 10:17 AM   HDL 55 05/03/2018 10:17 AM   CHOLHDL 2.9 05/03/2018 10:17 AM   CHOLHDL 2.6 01/06/2015 09:10 AM   LDLCALC 88 05/03/2018 10:17 AM    Wt Readings from Last 3 Encounters:  10/12/19 225 lb (102.1 kg)  02/14/19 211 lb (95.7 kg)  01/16/19 211 lb (95.7 kg)     Exam:    Vital Signs:  BP 135/87   Ht 6' (1.829 m)   Wt 225 lb (102.1 kg)   BMI 30.52 kg/m    Exam not performed due to telephone visit  ASSESSMENT & PLAN:    1. Long-standing persistent atrial fibrillation, CHADSVASC score of 4 on warfarin by his preference 2. 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 History of DVT 5. History of cancer 6. Hypertension-controlled  Jordan [name] has had improvement in LVEF up to 60 to 65% however he does have severe LVH.  He is persistently in A. fib and anticoagulated on warfarin with therapeutic INRs.  He has a history of DVT which is not totally resolved and is now more chronic based on Dopplers in December.  His blood pressure is well controlled.  No changes made to his medicines today.  Follow-up with me annually or sooner as necessary.  COVID-19 Education: The signs and symptoms of COVID-19 were discussed with the patient and how to seek care for testing (follow up with PCP or arrange E-visit).  The importance of social  distancing was discussed today.  Patient Risk:   After full review of this patients clinical status, I feel that they are at least moderate risk at this time.  Time:   Today, I have spent 25 minutes with the patient with telehealth technology discussing echo results, hypertension, history of DVT, anticoagulation.     Medication Adjustments/Labs and Tests Ordered: Current medicines are reviewed at length with the patient today.  Concerns regarding medicines are outlined above.   Tests Ordered: No orders of the defined types were placed in this encounter.   Medication Changes: No orders of the defined types were placed in this encounter.   Disposition:  in 1  year(s)  Pixie Casino, MD, FACC, [place] Director of the Advanced Lipid Disorders &  Cardiovascular Risk Reduction Clinic Diplomate of the American Board of Clinical Lipidology Attending Cardiologist  Direct Dial: [phone]  Fax: [phone]  Website:  [email or web address]  Pixie Casino, MD  10/12/2019 9:20 AM

## 2019-10-15 ENCOUNTER — Telehealth: Payer: Self-pay | Admitting: Family Medicine

## 2019-10-18 ENCOUNTER — Other Ambulatory Visit: Payer: Self-pay

## 2019-10-18 ENCOUNTER — Other Ambulatory Visit (INDEPENDENT_AMBULATORY_CARE_PROVIDER_SITE_OTHER): Payer: Medicare Other

## 2019-10-18 DIAGNOSIS — Z23 Encounter for immunization: Secondary | ICD-10-CM | POA: Diagnosis not present

## 2019-10-22 ENCOUNTER — Other Ambulatory Visit: Payer: Self-pay | Admitting: Internal Medicine

## 2019-10-24 ENCOUNTER — Telehealth: Payer: Self-pay

## 2019-10-24 NOTE — Telephone Encounter (Signed)
lmomed for overdue inr 

## 2019-11-06 NOTE — Telephone Encounter (Signed)
dt ?

## 2019-12-10 ENCOUNTER — Telehealth: Payer: Self-pay

## 2019-12-10 NOTE — Telephone Encounter (Signed)
lmom for overdue inr 

## 2019-12-19 ENCOUNTER — Ambulatory Visit (INDEPENDENT_AMBULATORY_CARE_PROVIDER_SITE_OTHER): Payer: Medicare Other | Admitting: Pharmacist Clinician (PhC)/ Clinical Pharmacy Specialist

## 2019-12-19 ENCOUNTER — Other Ambulatory Visit: Payer: Self-pay

## 2019-12-19 DIAGNOSIS — I4891 Unspecified atrial fibrillation: Secondary | ICD-10-CM

## 2019-12-19 DIAGNOSIS — Z7901 Long term (current) use of anticoagulants: Secondary | ICD-10-CM | POA: Diagnosis not present

## 2019-12-19 LAB — POCT INR: INR: 1.8 — AB (ref 2.0–3.0)

## 2020-01-11 ENCOUNTER — Ambulatory Visit: Payer: Medicare Other | Admitting: Family Medicine

## 2020-01-14 ENCOUNTER — Ambulatory Visit: Payer: Medicare Other | Admitting: Family Medicine

## 2020-01-31 ENCOUNTER — Other Ambulatory Visit: Payer: Self-pay

## 2020-01-31 ENCOUNTER — Ambulatory Visit (INDEPENDENT_AMBULATORY_CARE_PROVIDER_SITE_OTHER): Payer: Medicare Other | Admitting: Pharmacist Clinician (PhC)/ Clinical Pharmacy Specialist

## 2020-01-31 DIAGNOSIS — I4891 Unspecified atrial fibrillation: Secondary | ICD-10-CM | POA: Diagnosis not present

## 2020-01-31 DIAGNOSIS — Z7901 Long term (current) use of anticoagulants: Secondary | ICD-10-CM | POA: Diagnosis not present

## 2020-01-31 LAB — POCT INR: INR: 1.8 — AB (ref 2.0–3.0)

## 2020-02-20 ENCOUNTER — Other Ambulatory Visit: Payer: Self-pay

## 2020-02-20 ENCOUNTER — Ambulatory Visit (INDEPENDENT_AMBULATORY_CARE_PROVIDER_SITE_OTHER): Payer: Medicare Other | Admitting: Pharmacist Clinician (PhC)/ Clinical Pharmacy Specialist

## 2020-02-20 DIAGNOSIS — Z7901 Long term (current) use of anticoagulants: Secondary | ICD-10-CM | POA: Diagnosis not present

## 2020-02-20 DIAGNOSIS — I4891 Unspecified atrial fibrillation: Secondary | ICD-10-CM

## 2020-02-20 LAB — POCT INR: INR: 3 (ref 2.0–3.0)

## 2020-03-07 ENCOUNTER — Other Ambulatory Visit: Payer: Self-pay | Admitting: Internal Medicine

## 2020-05-22 ENCOUNTER — Telehealth: Payer: Self-pay

## 2020-05-22 NOTE — Telephone Encounter (Signed)
LMOM FOR OVERDUE INR 

## 2020-05-27 ENCOUNTER — Telehealth: Payer: Self-pay

## 2020-05-27 NOTE — Telephone Encounter (Signed)
lmom for overdue inr 

## 2020-05-31 ENCOUNTER — Other Ambulatory Visit: Payer: Self-pay | Admitting: Internal Medicine

## 2020-05-31 DIAGNOSIS — I4819 Other persistent atrial fibrillation: Secondary | ICD-10-CM

## 2020-06-04 ENCOUNTER — Other Ambulatory Visit: Payer: Self-pay

## 2020-06-04 ENCOUNTER — Ambulatory Visit (INDEPENDENT_AMBULATORY_CARE_PROVIDER_SITE_OTHER): Payer: Medicare Other | Admitting: Pharmacist Clinician (PhC)/ Clinical Pharmacy Specialist

## 2020-06-04 DIAGNOSIS — Z7901 Long term (current) use of anticoagulants: Secondary | ICD-10-CM | POA: Diagnosis not present

## 2020-06-04 DIAGNOSIS — I4891 Unspecified atrial fibrillation: Secondary | ICD-10-CM | POA: Diagnosis not present

## 2020-06-04 LAB — POCT INR: INR: 1.3 — AB (ref 2.0–3.0)

## 2020-06-18 ENCOUNTER — Other Ambulatory Visit: Payer: Self-pay

## 2020-06-18 ENCOUNTER — Ambulatory Visit (INDEPENDENT_AMBULATORY_CARE_PROVIDER_SITE_OTHER): Payer: Medicare Other | Admitting: Pharmacist

## 2020-06-18 DIAGNOSIS — I4891 Unspecified atrial fibrillation: Secondary | ICD-10-CM

## 2020-06-18 DIAGNOSIS — Z7901 Long term (current) use of anticoagulants: Secondary | ICD-10-CM

## 2020-06-18 LAB — POCT INR: INR: 2.5 (ref 2.0–3.0)

## 2020-07-31 ENCOUNTER — Telehealth: Payer: Self-pay

## 2020-07-31 NOTE — Telephone Encounter (Signed)
lmomed for overdue inr 

## 2020-08-06 ENCOUNTER — Telehealth: Payer: Self-pay

## 2020-08-06 NOTE — Telephone Encounter (Signed)
lmom for overdue inr 

## 2020-08-22 ENCOUNTER — Ambulatory Visit (INDEPENDENT_AMBULATORY_CARE_PROVIDER_SITE_OTHER): Payer: Medicare Other | Admitting: Pharmacist

## 2020-08-22 ENCOUNTER — Other Ambulatory Visit: Payer: Self-pay

## 2020-08-22 DIAGNOSIS — I4891 Unspecified atrial fibrillation: Secondary | ICD-10-CM | POA: Diagnosis not present

## 2020-08-22 DIAGNOSIS — Z7901 Long term (current) use of anticoagulants: Secondary | ICD-10-CM | POA: Diagnosis not present

## 2020-08-22 DIAGNOSIS — I4811 Longstanding persistent atrial fibrillation: Secondary | ICD-10-CM

## 2020-08-22 LAB — POCT INR: INR: 3.5 — AB (ref 2.0–3.0)

## 2020-08-22 NOTE — Patient Instructions (Addendum)
Description   Hold dose today then continue with 1 tablet daily except for 1/2 tablet each Monday and Friday.  Repeat INR in 2 weeks.

## 2020-09-02 ENCOUNTER — Other Ambulatory Visit: Payer: Self-pay | Admitting: Internal Medicine

## 2020-09-02 DIAGNOSIS — I4819 Other persistent atrial fibrillation: Secondary | ICD-10-CM

## 2020-09-05 ENCOUNTER — Other Ambulatory Visit: Payer: Self-pay

## 2020-09-05 ENCOUNTER — Ambulatory Visit (INDEPENDENT_AMBULATORY_CARE_PROVIDER_SITE_OTHER): Payer: Medicare Other

## 2020-09-05 DIAGNOSIS — Z7901 Long term (current) use of anticoagulants: Secondary | ICD-10-CM | POA: Diagnosis not present

## 2020-09-05 DIAGNOSIS — I4891 Unspecified atrial fibrillation: Secondary | ICD-10-CM | POA: Diagnosis not present

## 2020-09-05 LAB — POCT INR: INR: 1.7 — AB (ref 2.0–3.0)

## 2020-09-05 NOTE — Patient Instructions (Signed)
Take 1.5 tablets today and then continue with 1 tablet daily except for 1/2 tablet each Monday and Friday.  Repeat INR in 2 weeks.

## 2020-10-01 ENCOUNTER — Telehealth: Payer: Self-pay

## 2020-10-01 NOTE — Telephone Encounter (Signed)
lmom for overdue inr 

## 2020-10-13 ENCOUNTER — Ambulatory Visit (INDEPENDENT_AMBULATORY_CARE_PROVIDER_SITE_OTHER): Payer: Medicare Other

## 2020-10-13 ENCOUNTER — Other Ambulatory Visit: Payer: Self-pay

## 2020-10-13 DIAGNOSIS — Z7901 Long term (current) use of anticoagulants: Secondary | ICD-10-CM | POA: Diagnosis not present

## 2020-10-13 DIAGNOSIS — I4891 Unspecified atrial fibrillation: Secondary | ICD-10-CM

## 2020-10-13 LAB — POCT INR: INR: 7.7 — AB (ref 2.0–3.0)

## 2020-10-13 NOTE — Patient Instructions (Signed)
Hold Monday, Tuesday and Wednesday and then continue taking  1 tablet daily except for 1/2 tablet each Monday and Friday.  Repeat INR in 2 weeks.

## 2020-10-27 ENCOUNTER — Other Ambulatory Visit: Payer: Self-pay

## 2020-10-27 ENCOUNTER — Ambulatory Visit (INDEPENDENT_AMBULATORY_CARE_PROVIDER_SITE_OTHER): Payer: Medicare Other

## 2020-10-27 DIAGNOSIS — I4891 Unspecified atrial fibrillation: Secondary | ICD-10-CM

## 2020-10-27 DIAGNOSIS — Z7901 Long term (current) use of anticoagulants: Secondary | ICD-10-CM | POA: Diagnosis not present

## 2020-10-27 LAB — POCT INR: INR: 3.4 — AB (ref 2.0–3.0)

## 2020-10-27 NOTE — Patient Instructions (Signed)
Hold today and then continue taking  1 tablet daily except for 1/2 tablet each Monday and Friday.  Repeat INR in 4 weeks.

## 2020-11-02 ENCOUNTER — Other Ambulatory Visit: Payer: Self-pay | Admitting: Internal Medicine

## 2020-11-11 ENCOUNTER — Other Ambulatory Visit: Payer: Self-pay | Admitting: Internal Medicine

## 2020-11-11 DIAGNOSIS — I4819 Other persistent atrial fibrillation: Secondary | ICD-10-CM

## 2020-11-24 ENCOUNTER — Ambulatory Visit: Payer: Medicare Other | Admitting: Internal Medicine

## 2020-11-24 ENCOUNTER — Encounter: Payer: Self-pay | Admitting: Internal Medicine

## 2020-11-24 ENCOUNTER — Ambulatory Visit (INDEPENDENT_AMBULATORY_CARE_PROVIDER_SITE_OTHER): Payer: Medicare Other

## 2020-11-24 ENCOUNTER — Other Ambulatory Visit: Payer: Self-pay

## 2020-11-24 VITALS — BP 128/82 | HR 107 | Ht 72.0 in | Wt 206.0 lb

## 2020-11-24 DIAGNOSIS — I4891 Unspecified atrial fibrillation: Secondary | ICD-10-CM

## 2020-11-24 DIAGNOSIS — I10 Essential (primary) hypertension: Secondary | ICD-10-CM | POA: Diagnosis not present

## 2020-11-24 DIAGNOSIS — Z7901 Long term (current) use of anticoagulants: Secondary | ICD-10-CM

## 2020-11-24 DIAGNOSIS — I4811 Longstanding persistent atrial fibrillation: Secondary | ICD-10-CM

## 2020-11-24 DIAGNOSIS — I428 Other cardiomyopathies: Secondary | ICD-10-CM

## 2020-11-24 LAB — POCT INR: INR: 3.5 — AB (ref 2.0–3.0)

## 2020-11-24 NOTE — Progress Notes (Signed)
OFFICE NOTE  Chief Complaint:  Follow-up  Primary Care Physician: [name], MD  HPI:  Jordan [name]. is a 74 year old gentleman with history of paroxysmal A-fib and DVT in the past, as well as hypertension. He has been on Coumadin. He recently had some left lower extremity swelling and underwent Doppler ultrasound. This did not demonstrate any DVT; however, there was no evaluation for superficial reflux. 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 He also has paroxysmal A-fib with no real recurrence recently and he is chronically anticoagulated on Coumadin. His INR had been 3.6. Medication was held and then his INR was then subtherapeutic, restarted and now is at 3.6 today. Unfortunately, I think the dose of Coumadin he is on at 10 mg is just too much for him. He has a history of hypertension which has not been bothersome and he noted that his swelling has come down since he saw our PA about 6 or 7 days ago. He has as a primary care provider who drew some fluid off of his knee. He was fitted with compression stockings and reports marked improvement of his swelling. 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 His INR checked today shows excellent control on his current regimen which is actually a fairly high dose of warfarin at an INR of 2.5.  I had the pleasure seeing Jordan [name] back today. He has not followed up since September 2014. His last INR check was September 2015, which is about 6 months ago. He says that with his work he is not able to get regular INR checks, but we discussed at length today the importance of monthly INR monitoring and the dangers of not following his INR closely. His INR was checked in the office and was elevated today at 3.7. He's had high levels in the past is well. We discussed options including switching him to a NOAC or  possibly arranging for him to get blood work near work and send those values in for [place] to adjust. He tells [place] that he is actually going to retire in May and we may be able to get him through until that time. He denies any chest pain or worsening shortness of breath.  01/07/2017  Jordan [name] returns today for follow-up. His blood pressure is notably elevated 146/100. Heart rate is not controlled at 119. He has been somewhat compliant with his INRs however I did not see him since March 2016. He says that the cost is an issue with follow-up appointments. Despite his elevated heart rate with A. fib he is not had an echocardiogram that I can see in the recent past. He denies any chest pain but does get some shortness of breath with exertion.  02/15/2017  Jordan [name] returns back today for follow-up. Heart rate is now better controlled at 56 however he remains in atrial fibrillation. He underwent an echocardiogram does show a mildly reduced LV EF of 45-50% and severe left atrial enlargement. I reviewed a number of his EKGs from the past indicating he's probably been in persistent or long-standing persistent atrial fibrillation since 2014. Based on this, and given his severe lower atrial enlargement, it is unlikely for [place] to reestablish sinus rhythm unless we use antiarrhythmic medication. He seems to be unaware of his A. fib and has NYHA class 1-2 heart failure symptoms. I would recommend further  medical therapy for congestive heart failure at this time.  5/18/018  Jordan [name] returns for follow-up today. He is without complaints. He remains in A. fib and heart rate is elevated today 116. He says he's not very compliant with twice-daily carvedilol dosing but is willing to work on it. Blood pressure is also elevated today to recheck was not much improved. His INR was checked in warfarin was adjusted by pharmacy. She denies any chest pain or shortness of breath.  05/03/2018  Jordan [name] is seen today for  annual follow-up.  He reports has been out of his warfarin for about 3 days.  He was up in [place] with an ailing family member.  He did not realize he could get a refill of his prescription there.  His INR was low today.  We will further adjust that.  He is also been out of some of his blood pressure medicines which she refilled and just recently started taking.  Blood pressure is notably elevated today 154/104.  He has rate controlled A. fib.  He was noted to have severe atrial enlargement which make it unlikely for him to establish sinus rhythm with cardioversion.  06/13/2018  Jordan [name] returns today for follow-up.  I readjusted his medications a few months ago when he seen some improvement in his symptoms.  His INR was slightly off today and readjusted by her pharmacist Cyril Mourning.  Blood pressure is very difficult to auscultate.  Initially was 140/76 and we measured about 136/92.  Weight is down 3 pounds.  Again he seems to be tolerating medications.  He will need LV reassessment in 6 months.  11/24/2020  Jordan [name] is seen today in follow-up.  I saw him last year via telemedicine visit.  Overall he seems to be doing well.  He is followed in our anticoagulation clinic.  He continues to want to stay on warfarin after offering to switch to a direct oral anticoagulant.  He is lost a little weight and made some dietary changes.  Recently had gout and was taken off of HCTZ and had an increase in his losartan.  He is also now on low-dose allopurinol.  Overall he is doing much better.  INRs have been a little elevated around 3.5.  Blood pressure was initially elevated today however came down to 128/82.  He denies any chest pain or worsening shortness of breath.  His last echo in 2019 showed normalization of LVEF up to 60 to 65%.  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 Risa Grill   TRANSTHORACIC ECHOCARDIOGRAM  05/2006   EF ~15%; LV systolic function normal; mild focal basal septal  hypertrophy; AV thickness mildly increased; LA mod-markedly dilate    FAMHx:  Family History  Problem Relation Age of Onset   Brain cancer Mother        brain tumor   Colon cancer Mother    Stroke Father    Rectal cancer Neg Hx    Stomach cancer Neg Hx    Esophageal cancer Neg Hx     SOCHx:   reports that he has never smoked. He has never used smokeless tobacco. He reports current alcohol use of about 3.0 standard drinks of alcohol per week. He reports that he does not use drugs.  ALLERGIES:  No Known Allergies  ROS: Pertinent items noted in HPI and remainder of comprehensive ROS otherwise negative.  HOME MEDS: Current Outpatient Medications  Medication Sig Dispense Refill   allopurinol (ZYLOPRIM) 100 MG tablet Take 100 mg by mouth daily.     aspirin 81 MG chewable tablet Chew 81 mg by mouth daily.     carvedilol (COREG) 25 MG tablet TAKE 1 TABLET(25 MG) BY MOUTH TWICE DAILY WITH A MEAL 60 tablet 7   losartan (COZAAR) 100 MG tablet Take 100 mg by mouth daily.     sildenafil (REVATIO) 20 MG tablet Take 1 to 5 pills as needed 50 tablet 2   warfarin (COUMADIN) 7.5 MG tablet TAKE 1 TABLET BY MOUTH DAILY AS DIRECTED BY COUMADIN CLINIC 30 tablet 0   No current facility-administered medications for this visit.     LABS/IMAGING: Results for orders placed or performed in visit on 11/24/20 (from the past 48 hour(s))  POCT INR     Status: Abnormal   Collection Time: 11/24/20  1:52 PM  Result Value Ref Range   INR 3.5 (A) 2.0 - 3.0   No results found.  VITALS: BP 128/82    Pulse (!) 107    Ht 6' (1.829 m)    Wt 206 lb (93.4 kg)    SpO2 99%    BMI 27.94 kg/m   EXAM: General appearance: alert and no distress Neck: no carotid bruit and no JVD Lungs: clear to auscultation bilaterally Heart: irregularly irregular rhythm Abdomen: soft, non-tender; bowel sounds normal; no masses,  no organomegaly Extremities: extremities normal, atraumatic, no cyanosis or edema Pulses: 2+ and symmetric Skin: Skin color, texture, turgor normal. No rashes or lesions Neurologic: Grossly normal  EKG: A. fib with RVR at 107-personally reviewed  ASSESSMENT: 1. Long-standing persistent atrial fibrillation, CHADSVASC score of 4 on warfarin by his preference 2. 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 History of DVT 5. History of cancer 6. Hypertension-controlled 7. Gout  PLAN: 1.   Mr. Vigo seems to be doing well and is asymptomatic.  He denies chest pain or worsening shortness of breath.  INRs have been therapeutic to supratherapeutic.  He recently had gout and was taken off of his thiazide.  Blood pressure seems to be controlled.  He is not interested in switching to a direct oral anticoagulant.  No changes in his medicines today.  Follow-up with me annually or sooner as necessary.  Pixie Casino, MD, [facility], [place] Director of the Advanced Lipid Disorders &  Cardiovascular Risk Reduction Clinic Diplomate of the American Board of Clinical Lipidology Attending Cardiologist  Direct Dial: [phone]   Fax: [phone]  Website:   [email or web address]   Nadean Corwin  Chen Holzman 11/24/2020, 3:00 PM

## 2020-11-24 NOTE — Patient Instructions (Signed)

## 2020-11-24 NOTE — Patient Instructions (Signed)
Hold today and tomorrow and then continue taking  1 tablet daily except for 1/2 tablet each Monday and Friday.  Repeat INR in 4 weeks.

## 2020-12-18 ENCOUNTER — Other Ambulatory Visit: Payer: Self-pay | Admitting: Internal Medicine

## 2020-12-18 DIAGNOSIS — I4819 Other persistent atrial fibrillation: Secondary | ICD-10-CM

## 2020-12-22 ENCOUNTER — Telehealth: Payer: Self-pay

## 2020-12-22 NOTE — Telephone Encounter (Signed)
lmom for missed appt 

## 2021-01-08 ENCOUNTER — Telehealth: Payer: Self-pay

## 2021-01-08 NOTE — Telephone Encounter (Signed)
lmom for overdue inr 

## 2021-02-02 ENCOUNTER — Telehealth: Payer: Self-pay

## 2021-02-02 NOTE — Telephone Encounter (Signed)
lmom for overdue inr 

## 2021-02-16 ENCOUNTER — Ambulatory Visit (INDEPENDENT_AMBULATORY_CARE_PROVIDER_SITE_OTHER): Payer: Medicare Other

## 2021-02-16 ENCOUNTER — Other Ambulatory Visit: Payer: Self-pay

## 2021-02-16 DIAGNOSIS — Z7901 Long term (current) use of anticoagulants: Secondary | ICD-10-CM | POA: Diagnosis not present

## 2021-02-16 DIAGNOSIS — Z5181 Encounter for therapeutic drug level monitoring: Secondary | ICD-10-CM

## 2021-02-16 DIAGNOSIS — I4891 Unspecified atrial fibrillation: Secondary | ICD-10-CM | POA: Diagnosis not present

## 2021-02-16 LAB — POCT INR: INR: 2.9 (ref 2.0–3.0)

## 2021-02-16 NOTE — Patient Instructions (Signed)
Take 1 tablet daily except 0.5 tablet on Monday and Friday.  INR in 6 weeks.  

## 2021-03-30 ENCOUNTER — Ambulatory Visit (INDEPENDENT_AMBULATORY_CARE_PROVIDER_SITE_OTHER): Payer: Medicare Other

## 2021-03-30 ENCOUNTER — Other Ambulatory Visit: Payer: Self-pay

## 2021-03-30 DIAGNOSIS — I4891 Unspecified atrial fibrillation: Secondary | ICD-10-CM

## 2021-03-30 DIAGNOSIS — Z5181 Encounter for therapeutic drug level monitoring: Secondary | ICD-10-CM | POA: Diagnosis not present

## 2021-03-30 DIAGNOSIS — Z7901 Long term (current) use of anticoagulants: Secondary | ICD-10-CM | POA: Diagnosis not present

## 2021-03-30 LAB — POCT INR: INR: 2.7 (ref 2.0–3.0)

## 2021-03-30 NOTE — Patient Instructions (Signed)
Take 1 tablet daily except 0.5 tablet on Monday and Friday.  INR in 6 weeks.

## 2021-04-01 ENCOUNTER — Other Ambulatory Visit: Payer: Self-pay | Admitting: Internal Medicine

## 2021-04-27 ENCOUNTER — Observation Stay (HOSPITAL_COMMUNITY): Payer: Medicare Other

## 2021-04-27 ENCOUNTER — Emergency Department (HOSPITAL_COMMUNITY): Payer: Medicare Other

## 2021-04-27 ENCOUNTER — Encounter (HOSPITAL_COMMUNITY): Payer: Self-pay | Admitting: Emergency Medicine

## 2021-04-27 ENCOUNTER — Inpatient Hospital Stay (HOSPITAL_COMMUNITY)
Admission: EM | Admit: 2021-04-27 | Discharge: 2021-05-01 | DRG: 064 | Disposition: A | Discharge status: Rehabilitation Facility | Payer: Medicare Other | Attending: Internal Medicine | Admitting: Internal Medicine

## 2021-04-27 DIAGNOSIS — R778 Other specified abnormalities of plasma proteins: Secondary | ICD-10-CM | POA: Diagnosis present

## 2021-04-27 DIAGNOSIS — Z79899 Other long term (current) drug therapy: Secondary | ICD-10-CM

## 2021-04-27 DIAGNOSIS — Z823 Family history of stroke: Secondary | ICD-10-CM

## 2021-04-27 DIAGNOSIS — R29701 NIHSS score 1: Secondary | ICD-10-CM | POA: Diagnosis present

## 2021-04-27 DIAGNOSIS — I639 Cerebral infarction, unspecified: Secondary | ICD-10-CM | POA: Diagnosis present

## 2021-04-27 DIAGNOSIS — E860 Dehydration: Secondary | ICD-10-CM | POA: Diagnosis present

## 2021-04-27 DIAGNOSIS — B182 Chronic viral hepatitis C: Secondary | ICD-10-CM

## 2021-04-27 DIAGNOSIS — N183 Chronic kidney disease, stage 3 unspecified: Secondary | ICD-10-CM | POA: Diagnosis present

## 2021-04-27 DIAGNOSIS — Z808 Family history of malignant neoplasm of other organs or systems: Secondary | ICD-10-CM

## 2021-04-27 DIAGNOSIS — H538 Other visual disturbances: Secondary | ICD-10-CM

## 2021-04-27 DIAGNOSIS — F1910 Other psychoactive substance abuse, uncomplicated: Secondary | ICD-10-CM

## 2021-04-27 DIAGNOSIS — I428 Other cardiomyopathies: Secondary | ICD-10-CM | POA: Diagnosis present

## 2021-04-27 DIAGNOSIS — Z8249 Family history of ischemic heart disease and other diseases of the circulatory system: Secondary | ICD-10-CM

## 2021-04-27 DIAGNOSIS — I10 Essential (primary) hypertension: Secondary | ICD-10-CM | POA: Diagnosis present

## 2021-04-27 DIAGNOSIS — I619 Nontraumatic intracerebral hemorrhage, unspecified: Secondary | ICD-10-CM | POA: Diagnosis present

## 2021-04-27 DIAGNOSIS — I4819 Other persistent atrial fibrillation: Secondary | ICD-10-CM | POA: Diagnosis present

## 2021-04-27 DIAGNOSIS — Z8546 Personal history of malignant neoplasm of prostate: Secondary | ICD-10-CM

## 2021-04-27 DIAGNOSIS — M109 Gout, unspecified: Secondary | ICD-10-CM | POA: Diagnosis present

## 2021-04-27 DIAGNOSIS — Z20822 Contact with and (suspected) exposure to covid-19: Secondary | ICD-10-CM | POA: Diagnosis present

## 2021-04-27 DIAGNOSIS — R29810 Facial weakness: Secondary | ICD-10-CM | POA: Diagnosis present

## 2021-04-27 DIAGNOSIS — H532 Diplopia: Secondary | ICD-10-CM | POA: Diagnosis present

## 2021-04-27 DIAGNOSIS — Z86718 Personal history of other venous thrombosis and embolism: Secondary | ICD-10-CM

## 2021-04-27 DIAGNOSIS — I129 Hypertensive chronic kidney disease with stage 1 through stage 4 chronic kidney disease, or unspecified chronic kidney disease: Secondary | ICD-10-CM | POA: Diagnosis present

## 2021-04-27 DIAGNOSIS — D751 Secondary polycythemia: Secondary | ICD-10-CM | POA: Diagnosis present

## 2021-04-27 DIAGNOSIS — Z9079 Acquired absence of other genital organ(s): Secondary | ICD-10-CM

## 2021-04-27 DIAGNOSIS — R270 Ataxia, unspecified: Secondary | ICD-10-CM

## 2021-04-27 DIAGNOSIS — I422 Other hypertrophic cardiomyopathy: Secondary | ICD-10-CM | POA: Diagnosis present

## 2021-04-27 DIAGNOSIS — I6381 Other cerebral infarction due to occlusion or stenosis of small artery: Secondary | ICD-10-CM | POA: Diagnosis present

## 2021-04-27 DIAGNOSIS — D72829 Elevated white blood cell count, unspecified: Secondary | ICD-10-CM

## 2021-04-27 DIAGNOSIS — R7989 Other specified abnormal findings of blood chemistry: Secondary | ICD-10-CM

## 2021-04-27 DIAGNOSIS — I4892 Unspecified atrial flutter: Secondary | ICD-10-CM | POA: Diagnosis present

## 2021-04-27 DIAGNOSIS — R791 Abnormal coagulation profile: Secondary | ICD-10-CM | POA: Diagnosis present

## 2021-04-27 DIAGNOSIS — E785 Hyperlipidemia, unspecified: Secondary | ICD-10-CM | POA: Diagnosis present

## 2021-04-27 DIAGNOSIS — N179 Acute kidney failure, unspecified: Secondary | ICD-10-CM | POA: Diagnosis present

## 2021-04-27 DIAGNOSIS — Z7901 Long term (current) use of anticoagulants: Secondary | ICD-10-CM

## 2021-04-27 DIAGNOSIS — Z8739 Personal history of other diseases of the musculoskeletal system and connective tissue: Secondary | ICD-10-CM

## 2021-04-27 DIAGNOSIS — I6389 Other cerebral infarction: Secondary | ICD-10-CM | POA: Diagnosis not present

## 2021-04-27 LAB — CBC WITH DIFFERENTIAL/PLATELET
Abs Immature Granulocytes: 0.06 10*3/uL (ref 0.00–0.07)
Basophils Absolute: 0 10*3/uL (ref 0.0–0.1)
Basophils Relative: 1 %
Eosinophils Absolute: 0 10*3/uL (ref 0.0–0.5)
Eosinophils Relative: 1 %
HCT: 55.7 % — ABNORMAL HIGH (ref 39.0–52.0)
Hemoglobin: 19.2 g/dL — ABNORMAL HIGH (ref 13.0–17.0)
Immature Granulocytes: 1 %
Lymphocytes Relative: 18 %
Lymphs Abs: 1.5 10*3/uL (ref 0.7–4.0)
MCH: 30.5 pg (ref 26.0–34.0)
MCHC: 34.5 g/dL (ref 30.0–36.0)
MCV: 88.6 fL (ref 80.0–100.0)
Monocytes Absolute: 1.1 10*3/uL — ABNORMAL HIGH (ref 0.1–1.0)
Monocytes Relative: 13 %
Neutro Abs: 5.8 10*3/uL (ref 1.7–7.7)
Neutrophils Relative %: 66 %
Platelets: 205 10*3/uL (ref 150–400)
RBC: 6.29 MIL/uL — ABNORMAL HIGH (ref 4.22–5.81)
RDW: 14.6 % (ref 11.5–15.5)
WBC: 8.6 10*3/uL (ref 4.0–10.5)
nRBC: 0 % (ref 0.0–0.2)

## 2021-04-27 LAB — COMPREHENSIVE METABOLIC PANEL
ALT: 36 U/L (ref 0–44)
AST: 63 U/L — ABNORMAL HIGH (ref 15–41)
Albumin: 3 g/dL — ABNORMAL LOW (ref 3.5–5.0)
Alkaline Phosphatase: 77 U/L (ref 38–126)
Anion gap: 11 (ref 5–15)
BUN: 28 mg/dL — ABNORMAL HIGH (ref 8–23)
CO2: 22 mmol/L (ref 22–32)
Calcium: 9.4 mg/dL (ref 8.9–10.3)
Chloride: 105 mmol/L (ref 98–111)
Creatinine, Ser: 1.69 mg/dL — ABNORMAL HIGH (ref 0.61–1.24)
GFR, Estimated: 42 mL/min — ABNORMAL LOW (ref >60)
Glucose, Bld: 105 mg/dL — ABNORMAL HIGH (ref 70–99)
Potassium: 3.6 mmol/L (ref 3.5–5.1)
Sodium: 138 mmol/L (ref 135–145)
Total Bilirubin: 2 mg/dL — ABNORMAL HIGH (ref 0.3–1.2)
Total Protein: 7.7 g/dL (ref 6.5–8.1)

## 2021-04-27 LAB — PROTIME-INR
INR: 1.3 — ABNORMAL HIGH (ref 0.8–1.2)
Prothrombin Time: 16.6 seconds — ABNORMAL HIGH (ref 11.4–15.2)

## 2021-04-27 LAB — ETHANOL: Alcohol, Ethyl (B): <10 mg/dL (ref <10)

## 2021-04-27 LAB — MAGNESIUM: Magnesium: 2 mg/dL (ref 1.7–2.4)

## 2021-04-27 LAB — BRAIN NATRIURETIC PEPTIDE: B Natriuretic Peptide: 446.7 pg/mL — ABNORMAL HIGH (ref 0.0–100.0)

## 2021-04-27 LAB — TSH: TSH: 2.994 u[IU]/mL (ref 0.350–4.500)

## 2021-04-27 MED ORDER — HYDRALAZINE HCL 25 MG PO TABS
25.0000 mg | ORAL_TABLET | Freq: Four times a day (QID) | ORAL | Status: DC | PRN
  Administered 2021-04-28: 25 mg via ORAL
  Filled 2021-04-27: qty 1

## 2021-04-27 MED ORDER — ACETAMINOPHEN 160 MG/5ML PO SOLN: 650.0000 mg | ORAL | Status: DC | PRN

## 2021-04-27 MED ORDER — SENNOSIDES-DOCUSATE SODIUM 8.6-50 MG PO TABS: 1.0000 | ORAL_TABLET | Freq: Every evening | ORAL | Status: DC | PRN

## 2021-04-27 MED ORDER — SODIUM CHLORIDE 0.9 % IV BOLUS
1000.0000 mL | Freq: Once | INTRAVENOUS | Status: AC
  Administered 2021-04-28: 1000 mL via INTRAVENOUS

## 2021-04-27 MED ORDER — ATORVASTATIN CALCIUM 80 MG PO TABS
80.0000 mg | ORAL_TABLET | Freq: Every day | ORAL | Status: DC
  Administered 2021-04-28: 80 mg via ORAL
  Filled 2021-04-27 (×2): qty 1

## 2021-04-27 MED ORDER — ALLOPURINOL 100 MG PO TABS
100.0000 mg | ORAL_TABLET | Freq: Every day | ORAL | Status: DC
  Administered 2021-04-28 – 2021-05-01 (×4): 100 mg via ORAL
  Filled 2021-04-27 (×5): qty 1

## 2021-04-27 MED ORDER — HEPARIN SODIUM (PORCINE) 5000 UNIT/ML IJ SOLN
5000.0000 [IU] | Freq: Three times a day (TID) | INTRAMUSCULAR | Status: DC
  Administered 2021-04-28 – 2021-05-01 (×11): 5000 [IU] via SUBCUTANEOUS
  Filled 2021-04-27 (×11): qty 1

## 2021-04-27 MED ORDER — ASPIRIN EC 81 MG PO TBEC
81.0000 mg | DELAYED_RELEASE_TABLET | Freq: Every day | ORAL | Status: DC
  Administered 2021-04-28 – 2021-05-01 (×5): 81 mg via ORAL
  Filled 2021-04-27 (×4): qty 1

## 2021-04-27 MED ORDER — ACETAMINOPHEN 325 MG PO TABS: 650.0000 mg | ORAL_TABLET | ORAL | Status: DC | PRN

## 2021-04-27 MED ORDER — CARVEDILOL 25 MG PO TABS
25.0000 mg | ORAL_TABLET | Freq: Two times a day (BID) | ORAL | Status: DC
  Administered 2021-04-28 – 2021-05-01 (×8): 25 mg via ORAL
  Filled 2021-04-27 (×4): qty 1
  Filled 2021-04-27: qty 2
  Filled 2021-04-27 (×2): qty 1
  Filled 2021-04-27: qty 2

## 2021-04-27 MED ORDER — ACETAMINOPHEN 650 MG RE SUPP
650.0000 mg | RECTAL | Status: DC | PRN
Start: 2021-04-27 — End: 2021-05-01

## 2021-04-27 MED ORDER — SODIUM CHLORIDE 0.9 % IV SOLN: INTRAVENOUS | Status: AC

## 2021-04-27 MED ORDER — STROKE: EARLY STAGES OF RECOVERY BOOK
Freq: Once | Status: AC
  Filled 2021-04-27: qty 1

## 2021-04-27 NOTE — ED Provider Notes (Signed)
[facility] DEPARTMENT Provider Note   CSN: [id number] Arrival date & time: 04/27/21  1913    History Chief Complaint  Patient presents with  . Weakness  . Dizziness  . Blurred Vision    Jordan [name]. is a 75 y.o. male.  HPI 75 year old male with history of hypertension, atrial fibrillation on Coumadin, DVT, and gout presents the emergency department for gait disturbance and vision changes.  He states this started about 2 days ago when he had 2 glasses of wine.  States he is not an everyday drinker, has never been in withdrawal.  Drinks about 3 drinks a week.  Had 2 glasses of wine a couple days ago, and went home where he hallucinated that his sister and his friend were in his house.  States the hallucinations have resolved, but he has since had an unsteady gait.  Is unable to describe it well, but states he stumbles to both sides equally.  This morning, when he tried to open his phone he states that the numbers were too blurry and it took him an hour to dial his sister's number.  States this is never happened to him before.  Reports resolution of the blurred vision, but states that when I walked in the room he saw 2 of me.  This only happens when he has both eyes open, goes away with only 1 eye open.  Sister reports that it looks like his left eye is bulging out more than his right eye.  Has not taken anything for the symptoms.  Nothing seemed to make them better, nothing seem to make them worse.    Past Medical History:  Diagnosis Date  . Atrial fib/flutter, transient    Hx  . Atrial fibrillation (HCC)    coumadin  . Colon cancer [facility])    family hx  . Diverticulosis   . Gout   . Hemorrhoids   . Hepatitis C   . History of DVT (deep vein thrombosis)   . History of nuclear stress test 04/2002   exercise; normal study   . Hypertension   . 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 Personal history of colonic polyps   .  Prostate cancer [facility])    radical prostatectomy     Patient Active Problem List   Diagnosis Date Noted  . Right thalamic infarction ([place]) 04/27/2021  . Personal history of colonic polyps 01/16/2019  . Family history of colon cancer in mother 01/16/2019  . Screening for lipid disorders 05/03/2018  . NICM (nonischemic cardiomyopathy) ([place]) 02/15/2017  . Chronic systolic heart failure ([place]) 02/15/2017  . Essential hypertension 01/07/2017  . Persistent atrial fibrillation ([place]) 03/15/2013  . Long term (current) use of anticoagulants 03/15/2013  . History of prostate cancer 02/16/2013    Past Surgical History:  Procedure Laterality Date  . CARDIAC CATHETERIZATION  2007   no occlusive CAD  . COLONOSCOPY  [id number]   [name]  . PROSTATECTOMY    . PROSTATECTOMY  04-04   Dr. Risa Grill  . TRANSTHORACIC ECHOCARDIOGRAM  05/2006   EF ~10%; LV systolic function normal; mild focal basal septal hypertrophy; AV thickness mildly increased; LA mod-markedly dilate       Family History  Problem Relation Age of Onset  . Brain cancer Mother        brain tumor  . Colon cancer Mother   . Stroke Father   . Rectal cancer Neg Hx   .  Stomach cancer Neg Hx   . Esophageal cancer Neg Hx     Social History   Tobacco Use  . Smoking status: Never Smoker  . Smokeless tobacco: Never Used  Vaping Use  . Vaping Use: Never used  Substance Use Topics  . Alcohol use: Yes    Alcohol/week: 3.0 standard drinks    Types: 3 Glasses of wine per week    Comment: occasional   . Drug use: No    Home Medications Prior to Admission medications   Medication Sig Start Date End Date Taking? Authorizing Provider  allopurinol (ZYLOPRIM) 100 MG tablet Take 100 mg by mouth daily.   Yes [provider]  carvedilol (COREG) 25 MG tablet TAKE 1 TABLET(25 MG) BY MOUTH TWICE DAILY WITH A MEAL Patient taking differently: Take 25 mg by mouth 2 (two) times daily with a meal. 03/07/20  Yes Hilty, Nadean Corwin, MD   losartan (COZAAR) 100 MG tablet Take 100 mg by mouth daily. 10/15/20  Yes [provider]  sildenafil (REVATIO) 20 MG tablet Take 1 to 5 pills as needed 01/09/19  Yes Denita Lung, MD  warfarin (COUMADIN) 7.5 MG tablet TAKE 1 TABLET BY MOUTH DAILY AS DIRECTED BY COUMADIN CLINIC Patient taking differently: Take 3.75-7.5 mg by mouth daily. 3.75 mg Monday and Friday. 7.5 mg Tuesday,Wednesday,Thursday,Saturday and Sunday. 12/22/20  Yes Hilty, Nadean Corwin, MD    Allergies    Patient has no known allergies.  Review of Systems   Review of Systems  Constitutional: Negative for chills and fever.  HENT: Negative for ear pain and sore throat.   Eyes: Positive for visual disturbance. Negative for pain.  Respiratory: Positive for shortness of breath (worsening for one month). Negative for cough.   Cardiovascular: Negative for chest pain and palpitations.  Gastrointestinal: Negative for abdominal pain and vomiting.  Genitourinary: Negative for dysuria and hematuria.  Musculoskeletal: Negative for arthralgias and back pain.  Skin: Negative for color change and rash.  Neurological: Positive for weakness (BLE). Negative for seizures and syncope.  Psychiatric/Behavioral: Positive for hallucinations.  All other systems reviewed and are negative.   Physical Exam Updated Vital Signs BP (!) 158/120   Pulse (!) 31   Temp 98.1 F (36.7 C) (Oral)   Resp (!) 21   SpO2 100%   Physical Exam Vitals and nursing note reviewed.  Constitutional:      General: He is not in acute distress.    Appearance: He is well-developed.  HENT:     Head: Normocephalic and atraumatic.     Right Ear: External ear normal.     Left Ear: External ear normal.     Nose: Nose normal.  Eyes:     Conjunctiva/sclera: Conjunctivae normal.     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 Rhythm irregular.     Heart sounds: Normal heart sounds. No murmur  heard.   Pulmonary:     Effort: Pulmonary effort is normal. No respiratory distress.     Breath sounds: Normal breath sounds.  Abdominal:     General: Abdomen is flat.     Palpations: Abdomen is soft.     Tenderness: There is no abdominal tenderness. There is no guarding.  Musculoskeletal:        General: No deformity.     Cervical back: Normal range of motion and neck supple. No tenderness.     Left lower leg: Edema present.  Skin:    General: Skin is warm and dry.     Capillary Refill: Capillary refill takes less than 2 seconds.  Neurological:     Mental Status: He is alert and oriented to person, place, and time.     Comments: Bilateral upper extremities 5/5 strength.  Right lower extremity 5/5 strength.  Left lower extremity 4/5 strength.  Station intact symmetrically.  Mildly hyperreflexic in bilateral lower extremities, no clonus.  Psychiatric:        Mood and Affect: Mood normal.        Behavior: Behavior normal.     ED Results / Procedures / Treatments   Labs (all labs ordered are listed, but only abnormal results are displayed) Labs Reviewed  CBC WITH DIFFERENTIAL/PLATELET - Abnormal; Notable for the following components:      Result Value   RBC 6.29 (*)    Hemoglobin 19.2 (*)    HCT 55.7 (*)    Monocytes Absolute 1.1 (*)    All other components within normal limits  COMPREHENSIVE METABOLIC PANEL - Abnormal; Notable for the following components:   Glucose, Bld 105 (*)    BUN 28 (*)    Creatinine, Ser 1.69 (*)    Albumin 3.0 (*)    AST 63 (*)    Total Bilirubin 2.0 (*)    GFR, Estimated 42 (*)    All other components within normal limits  PROTIME-INR - Abnormal; Notable for the following components:   Prothrombin Time 16.6 (*)    INR 1.3 (*)    All other components within normal limits  ETHANOL  TSH  BRAIN NATRIURETIC PEPTIDE  RPR  MAGNESIUM  URINALYSIS, ROUTINE W REFLEX MICROSCOPIC  PATHOLOGIST SMEAR REVIEW  TROPONIN I (HIGH SENSITIVITY)     EKG EKG Interpretation  Date/Time:  Monday Apr 27 2021 20:03:48 EDT Ventricular Rate:  116 PR Interval:    QRS Duration: 76 QT Interval:  374 QTC Calculation: 519 R Axis:   -79 Text Interpretation: Atrial fibrillation with rapid ventricular response Left axis deviation Abnormal ECG Since last tracing rate faster Confirmed by Wandra Arthurs [phone]) on 04/27/2021 9:14:04 PM   Radiology DG Chest 2 View  Result Date: 04/27/2021 CLINICAL DATA:  Weak and dizzy EXAM: CHEST - 2 VIEW COMPARISON:  10/20/2018 FINDINGS: Mild cardiomegaly. No focal opacity or pleural effusion. Aortic atherosclerosis. No pneumothorax. IMPRESSION: No active cardiopulmonary disease.  Borderline to mild cardiomegaly Electronically Signed   By: Donavan Foil M.D.   On: 04/27/2021 19:28   CT Head Wo Contrast  Result Date: 04/27/2021 CLINICAL DATA:  Dizziness and blurred vision. EXAM: CT HEAD WITHOUT CONTRAST TECHNIQUE: Contiguous axial images were obtained from the base of the skull through the vertex without intravenous contrast. COMPARISON:  October 20, 2018 FINDINGS: Brain: There is mild cerebral atrophy with widening of the extra-axial spaces and ventricular dilatation. There are areas of decreased attenuation within the white matter tracts of the supratentorial brain, consistent with microvascular disease changes. A 2.4 cm x 1.5 cm ill-defined area of low attenuation is seen within the anterior thalamic region on the right (axial CT image 16, CT series 4). This represents a new finding when compared to the prior exam. Very mild associated mass effect is seen with approximately 2 mm right to left midline shift (coronal reformatted image 40, CT series number 5). No associated acute hemorrhage is identified. Vascular: No hyperdense vessel or unexpected calcification. Skull: Normal. Negative for fracture or focal lesion. Sinuses/Orbits: No acute finding. Other: None.  IMPRESSION: Subacute anterior right thalamic infarct, as  described above. MRI correlation is recommended. Electronically Signed   By: Virgina Norfolk M.D.   On: 04/27/2021 21:47    Procedures Procedures   Medications Ordered in ED Medications  sodium chloride 0.9 % bolus 1,000 mL (has no administration in time range)    ED Course  I have reviewed the triage vital signs and the nursing notes.  Pertinent labs & imaging results that were available during my care of the patient were reviewed by me and considered in my medical decision making (see chart for details).    MDM Rules/Calculators/A&P                          75 year old male here with gait ataxia and visual disturbances.  Upon arrival, he is hemodynamically stable.  No acute distress.  Shows ataxic gait.  Appears to not be able to look up into the left with his left eye, concerning for cranial nerve VI palsy.  Endorses binocular diplopia.  Has mild weakness to left lower extremity along with swelling of his extremity.  Differential includes: Stroke, intracranial hemorrhage, metabolic derangement, Warnicke's, NPH  Labs show AKI with creatinine of 1.69, this is not significantly elevated from baseline.  Hemoglobin 19.2.  INR subtherapeutic at 1.3.  CT head shows subacute right anterior thalamic infarct with 2 mm right to left shift.  He was consulted, they came to evaluate patient in the emergency department.  Recommend MRI.  Patient admitted to the hospitalist service for MRI and further stroke evaluation.  Patient amenable to plan.  Final Clinical Impression(s) / ED Diagnoses Final diagnoses:  None    Rx / DC Orders ED Discharge Orders    None       Suzan Nailer, DO 04/27/21 2323    Drenda Freeze, MD 04/28/21 1500

## 2021-04-27 NOTE — ED Provider Notes (Signed)
Emergency Medicine Provider Triage Evaluation Note  Jordan [name]. 75 y.o. male was evaluated in triage.  Pt complains of presents for evaluation of generalized weakness and bilateral lower extremity symptoms been ongoing for last 2 to 3 days.  He states he initially noticed it 2 days ago.  He reports his sister is what made him come to the emergency department.  He does endorse that he was drinking alcohol 2 days ago when he started having symptoms.  Also reports he is dizzy.  Currently denies any chest pain, abdominal pain, nausea/vomiting.  Denies any alcohol use today.  Review of Systems  Positive: Dizziness, weakness Negative: Chest pain, difficulty breathing, numbness.  Physical Exam  BP 134/82   Pulse 70   Temp 98.2 F (36.8 C) (Oral)   Resp 18   Ht 5\' 4"  (1.626 m)   Wt 65.8 kg   SpO2 100%   BMI 24.89 kg/m  Gen:   Awake, no distress   HEENT:  Atraumatic  Resp:  Normal effort  Cardiac:  Normal rate  Abd:   Nondistended, nontender  MSK:   Moves extremities without difficulty  Neuro:  Speech clear   Medical Decision Making  Medically screening exam initiated at 3:55 AM.  Appropriate orders placed.  Jordan [name]. was informed that the remainder of the evaluation will be completed by another provider, this initial triage assessment does not replace that evaluation, and the importance of remaining in the ED until their evaluation is complete.   Clinical Impression  Dizziness, weakness   Portions of this note were generated with Dragon dictation software. Dictation errors may occur despite best attempts at proofreading.     Jordan Napoleon, PA-C 04/27/21 1853    Jordan Gibbs, DO 04/28/21 0012

## 2021-04-27 NOTE — H&P (Addendum)
History and Physical    Jordan [name]. HDQ:[id number] DOB: 06/07/1946 DOA: 04/27/2021  PCP: [name], MD  Patient coming from: Home via EMS  I have personally briefly reviewed patient's old medical records in [place]  Chief Complaint: Gait imbalance, blurry vision  HPI: Jordan [name]. is a 75 y.o. male with medical history significant for persistent atrial fibrillation on Coumadin, NICM, history of DVT, hypertension, CKD stage III, gout, and prostate cancer s/p radical prostatectomy who presents to the ED for evaluation of gait abnormality.  Patient states on 04/25/2021 he had a couple glasses of wine.  Afterwards he started feeling somewhat confused with new double vision.  He felt as if he had been drinking more than he actually had.  The following day he felt like he was hallucinating thinking there were other acquaintances in his house when they were not.  He has been having difficulty walking with feeling off balance and lightheaded.  He felt weak in his legs.  He did not have any room spinning sensation.  He denied any new numbness or tingling.  He has not had any weakness in his arms.  He otherwise denies any headache, nausea, vomiting, chest pain, palpitations, cough, abdominal pain, dysuria, diarrhea.  He does note some dyspnea with exertion.  He denies any obvious bleeding.  ED Course:  Initial vitals showed BP 140/111, pulse 61, RR 22, temp 97.9 F, SPO2 100% on room air.  Labs show sodium 138, potassium 3.6, bicarb 22, BUN 28, creatinine 1.69, serum glucose 105, AST 63, ALT 36, alk phos 77, total bilirubin 2.0, WBC 8.6, hemoglobin 19.2, hematocrit 55.7, platelets 205,000, INR 1.3.  2 view chest x-ray was negative for focal consolidation, edema, or effusion.  CT head without contrast showed a subacute anterior right thalamic infarct.  Patient was ordered to receive 1 L normal saline fluid bolus.  Neurology were consulted.  MRI brain and MRA head  were ordered and pending.  The hospitalist service was consulted to admit for further evaluation and management.  Review of Systems: All systems reviewed and are negative except as documented in history of present illness above.   Past Medical History:  Diagnosis Date  . Atrial fib/flutter, transient    Hx  . Atrial fibrillation (HCC)    coumadin  . Colon cancer [facility])    family hx  . Diverticulosis   . Gout   . Hemorrhoids   . Hepatitis C   . History of DVT (deep vein thrombosis)   . History of nuclear stress test 04/2002   exercise; normal study   . Hypertension   . 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[place])    history of   . Personal history of colonic polyps   . Prostate cancer [facility])    radical prostatectomy     Past Surgical History:  Procedure Laterality Date  . CARDIAC CATHETERIZATION  2007   no occlusive CAD  . COLONOSCOPY  1194,1740   [name]  . PROSTATECTOMY    . PROSTATECTOMY  04-04   Dr. Risa Grill  . TRANSTHORACIC ECHOCARDIOGRAM  05/2006   EF ~81%; LV systolic function normal; mild focal basal septal hypertrophy; AV thickness mildly increased; LA mod-markedly dilate    Social History:  reports that he has never smoked. He has never used smokeless tobacco. He reports current alcohol use of about 3.0 standard drinks of alcohol per week. He reports that he does not use drugs.  No Known Allergies  Family History  Problem Relation Age of Onset  . Brain cancer Mother        brain tumor  . Colon cancer Mother   . Stroke Father   . Rectal cancer Neg Hx   . Stomach cancer Neg Hx   . Esophageal cancer Neg Hx      Prior to Admission medications   Medication Sig Start Date End Date Taking? Authorizing Provider  allopurinol (ZYLOPRIM) 100 MG tablet Take 100 mg by mouth daily.   Yes [provider]  carvedilol (COREG) 25 MG tablet TAKE 1 TABLET(25 MG) BY MOUTH TWICE DAILY WITH A MEAL Patient taking differently: Take 25 mg by mouth 2 (two)  times daily with a meal. 03/07/20  Yes Hilty, Nadean Corwin, MD  losartan (COZAAR) 100 MG tablet Take 100 mg by mouth daily. 10/15/20  Yes [provider]  sildenafil (REVATIO) 20 MG tablet Take 1 to 5 pills as needed 01/09/19  Yes Denita Lung, MD  warfarin (COUMADIN) 7.5 MG tablet TAKE 1 TABLET BY MOUTH DAILY AS DIRECTED BY COUMADIN CLINIC Patient taking differently: Take 3.75-7.5 mg by mouth daily. 3.75 mg Monday and Friday. 7.5 mg Tuesday,Wednesday,Thursday,Saturday and Sunday. 12/22/20  Yes Pixie Casino, MD    Physical Exam: Vitals:   04/27/21 1955 04/27/21 2108 04/27/21 2145 04/27/21 2200  BP: (!) 140/111 (!) 144/116 (!) 170/123 (!) 158/120  Pulse: 61 (!) 58 (!) 31   Resp: (!) 22 (!) 22 (!) 23 (!) 21  Temp: 97.9 F (36.6 C) 98.1 F (36.7 C)    TempSrc: Oral Oral    SpO2: 100% 100% 100%    Constitutional: Resting supine in bed, NAD, calm, comfortable Eyes: PERRL, extraocular movement testing shows limitations of left eye movement superolaterally, ?  Proptosis of left eye.  Lids and conjunctivae normal.  Patient reports double vision. ENMT: Mucous membranes are dry. Posterior pharynx clear of any exudate or lesions.Normal dentition.  Neck: normal, supple, no masses. Respiratory: clear to auscultation bilaterally, no wheezing, no crackles. Normal respiratory effort. No accessory muscle use.  Cardiovascular: Tachycardic with irregularly irregular rhythm, no murmurs / rubs / gallops. No extremity edema. 2+ pedal pulses. Abdomen: no tenderness, no masses palpated.  Bowel sounds positive.  Musculoskeletal: no clubbing / cyanosis. No joint deformity upper and lower extremities. Good ROM, no contractures. Normal muscle tone.  Skin: no rashes, lesions, ulcers. No induration Neurologic: ? CN6 palsy affecting left eye otherwise CN 2-12 grossly intact. Sensation intact. Strength 5/5 in all 4.  Psychiatric: Normal judgment and insight. Alert and oriented x 3. Normal mood.   Labs on  Admission: I have personally reviewed following labs and imaging studies  CBC: Recent Labs  Lab 04/27/21 2008  WBC 8.6  NEUTROABS 5.8  HGB 19.2*  HCT 55.7*  MCV 88.6  PLT 161   Basic Metabolic Panel: Recent Labs  Lab 04/27/21 2008  NA 138  K 3.6  CL 105  CO2 22  GLUCOSE 105*  BUN 28*  CREATININE 1.69*  CALCIUM 9.4   GFR: CrCl cannot be calculated (Unknown ideal weight.). Liver Function Tests: Recent Labs  Lab 04/27/21 2008  AST 63*  ALT 36  ALKPHOS 77  BILITOT 2.0*  PROT 7.7  ALBUMIN 3.0*   No results for input(s): LIPASE, AMYLASE in the last 168 hours. No results for input(s): AMMONIA in the last 168 hours. Coagulation Profile: No results for input(s): INR, PROTIME in the last 168 hours. Cardiac Enzymes: No results for input(s): CKTOTAL, CKMB, CKMBINDEX, TROPONINI in  the last 168 hours. BNP (last 3 results) No results for input(s): PROBNP in the last 8760 hours. HbA1C: No results for input(s): HGBA1C in the last 72 hours. CBG: No results for input(s): GLUCAP in the last 168 hours. Lipid Profile: No results for input(s): CHOL, HDL, LDLCALC, TRIG, CHOLHDL, LDLDIRECT in the last 72 hours. Thyroid Function Tests: No results for input(s): TSH, T4TOTAL, FREET4, T3FREE, THYROIDAB in the last 72 hours. Anemia Panel: No results for input(s): VITAMINB12, FOLATE, FERRITIN, TIBC, IRON, RETICCTPCT in the last 72 hours. Urine analysis:    Component Value Date/Time   BILIRUBINUR n 01/06/2015 0917   PROTEINUR n 01/06/2015 0917   UROBILINOGEN negative 01/06/2015 0917   NITRITE n 01/06/2015 0917   LEUKOCYTESUR Negative 01/06/2015 0917    Radiological Exams on Admission: DG Chest 2 View  Result Date: 04/27/2021 CLINICAL DATA:  Weak and dizzy EXAM: CHEST - 2 VIEW COMPARISON:  10/20/2018 FINDINGS: Mild cardiomegaly. No focal opacity or pleural effusion. Aortic atherosclerosis. No pneumothorax. IMPRESSION: No active cardiopulmonary disease.  Borderline to mild  cardiomegaly Electronically Signed   By: Donavan Foil M.D.   On: 04/27/2021 19:28   CT Head Wo Contrast  Result Date: 04/27/2021 CLINICAL DATA:  Dizziness and blurred vision. EXAM: CT HEAD WITHOUT CONTRAST TECHNIQUE: Contiguous axial images were obtained from the base of the skull through the vertex without intravenous contrast. COMPARISON:  October 20, 2018 FINDINGS: Brain: There is mild cerebral atrophy with widening of the extra-axial spaces and ventricular dilatation. There are areas of decreased attenuation within the white matter tracts of the supratentorial brain, consistent with microvascular disease changes. A 2.4 cm x 1.5 cm ill-defined area of low attenuation is seen within the anterior thalamic region on the right (axial CT image 16, CT series 4). This represents a new finding when compared to the prior exam. Very mild associated mass effect is seen with approximately 2 mm right to left midline shift (coronal reformatted image 40, CT series number 5). No associated acute hemorrhage is identified. Vascular: No hyperdense vessel or unexpected calcification. Skull: Normal. Negative for fracture or focal lesion. Sinuses/Orbits: No acute finding. Other: None. IMPRESSION: Subacute anterior right thalamic infarct, as described above. MRI correlation is recommended. Electronically Signed   By: Virgina Norfolk M.D.   On: 04/27/2021 21:47    EKG: Personally reviewed.  Atrial fibrillation (versus a flutter with variable block?)  With controlled rate.  Rate is faster when compared to prior.  Assessment/Plan Principal Problem:   Right thalamic infarction [facility]) Active Problems:   Persistent atrial fibrillation (HCC)   Essential hypertension   Lautaro Koral. is a 75 y.o. male with medical history significant for persistent atrial fibrillation on Coumadin, NICM, history of DVT, hypertension, CKD stage III, gout, and prostate cancer s/p radical prostatectomy who is admitted with subacute anterior  right thalamic infarct.  Subacute anterior right thalamic infarct: Seen on CT head.  Neurology following. -MRI brain with and without contrast -CTA head and neck -Hold Coumadin for now per neurology -Start aspirin 81 mg daily -Echocardiogram -Monitor on telemetry, continue neurochecks -Check A1c and lipid panel -Atorvastatin 80 mg daily -PT/OT/SLP eval  Persistent atrial fibrillation: Has been on Coumadin which is on hold per neurology recommendations.  INR was subtherapeutic at 1.3.  Troponin elevated at 139.  Suspect related to A. Fib, his heart rate has become rapid while in the ED.  Will resume Coreg 25 mg twice daily use IV metoprolol as needed, trend troponin.  He denies  any chest pain.  Hypertension: Out of permissive hypertensive window.  Resume Coreg, holding losartan for now given renal function.  Add hydralazine as needed.  Presumed AKI on CKD stage III: Suspect prerenal as he reports poor oral intake over the last 2 days and feels dehydrated.  Order to receive 1 L normal saline bolus followed by maintenance IV fluids overnight.  Holding losartan for now.  Polycythemia: Suspect relative polycythemia due to dehydration.  Continue IV fluids and recheck in AM.   DVT prophylaxis: Subcutaneous heparin for now while holding Coumadin Code Status: Full code, confirmed with patient Family Communication: Discussed with patient spouse at bedside Disposition Plan: From home, dispo pending further stroke work-up and PT/OT/SLP Consults called: Neurology Level of care: Telemetry Medical Admission status:  Status is: Observation  The patient remains OBS appropriate and will d/c before 2 midnights.  Dispo: The patient is from: Home              Anticipated d/c is to: Home versus SNF              Patient currently is not medically stable to d/c.   Difficult to place patient No  Zada Finders MD [facility]  If 7PM-7AM, please contact  night-coverage [email or web address]  04/27/2021, 11:02 PM

## 2021-04-27 NOTE — Consult Note (Signed)
Neurology Consultation  Reason for Consult: stroke Referring Physician: Dr Allie Bossier, EDP  CC: weakness, dizziness   History is obtained from: patient, chart   HPI: Jordan [name]. is a 75 y.o. male past medical history of atrial fibrillation/atrial flutter on Coumadin, history of DVT, hepatitis C, hypertension, prostate cancer, prior history of substance abuse, current intermittent marijuana use, presenting to the emergency room for evaluation of gait problems, generalized weakness and vision changes. He reports that he did have a couple of drinks on Saturday, 04-25-2021, and started feeling out of sorts.  He felt confused, he also noted some visual issues that he describes as double vision when seeing further away.  He also started thinking that he has been drinking with someone for many days and is drunk although he had not had a drink in a few days.  His symptoms did not improve and he came to the ER for further evaluation. Initially evaluated in the ER and concern for stroke led to head CT which showed a right thalamic hypodensity concerning for subacute infarct. Neurology was consulted for further recommendations. ER was also concern for left cranial nerve VI palsy.  Patient's sister also reported that the left eye looks more swollen. No pain over the eye.  No pain with motion.   LKW: Sometime on 04/25/2021 tpa given?: no, outside the window, on Eliquis Premorbid modified Rankin scale (mRS): 0   ROS: Full ROS was performed and is negative except as noted in the HPI.    Past Medical History:  Diagnosis Date  . Atrial fib/flutter, transient    Hx  . Atrial fibrillation (HCC)    coumadin  . Colon cancer [facility])    family hx  . Diverticulosis   . Gout   . Hemorrhoids   . Hepatitis C   . History of DVT (deep vein thrombosis)   . History of nuclear stress test 04/2002   exercise; normal study   . Hypertension   . 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[place])    history of    . Personal history of colonic polyps   . Prostate cancer [facility])    radical prostatectomy    Family History  Problem Relation Age of Onset  . Brain cancer Mother        brain tumor  . Colon cancer Mother   . Stroke Father   . Rectal cancer Neg Hx   . Stomach cancer Neg Hx   . Esophageal cancer Neg Hx    Social History:   reports that he has never smoked. He has never used smokeless tobacco. He reports current alcohol use of about 3.0 standard drinks of alcohol per week. He reports that he does not use drugs.  Medications  Current Facility-Administered Medications:  .  sodium chloride 0.9 % bolus 1,000 mL, 1,000 mL, Intravenous, Once, Drenda Freeze, MD  Current Outpatient Medications:  .  allopurinol (ZYLOPRIM) 100 MG tablet, Take 100 mg by mouth daily., Disp: , Rfl:  .  carvedilol (COREG) 25 MG tablet, TAKE 1 TABLET(25 MG) BY MOUTH TWICE DAILY WITH A MEAL (Patient taking differently: Take 25 mg by mouth 2 (two) times daily with a meal.), Disp: 60 tablet, Rfl: 7 .  losartan (COZAAR) 100 MG tablet, Take 100 mg by mouth daily., Disp: , Rfl:  .  sildenafil (REVATIO) 20 MG tablet, Take 1 to 5 pills as needed, Disp: 50 tablet, Rfl: 2 .  warfarin (COUMADIN) 7.5 MG tablet, TAKE 1 TABLET BY MOUTH DAILY  AS DIRECTED BY COUMADIN CLINIC (Patient taking differently: Take 3.75-7.5 mg by mouth daily. 3.75 mg Monday and Friday. 7.5 mg Tuesday,Wednesday,Thursday,Saturday and Sunday.), Disp: 60 tablet, Rfl: 1   Exam: Current vital signs: BP (!) 158/120   Pulse (!) 31   Temp 98.1 F (36.7 C) (Oral)   Resp (!) 21   SpO2 100%  Vital signs in last 24 hours: Temp:  [97.9 F (36.6 C)-98.1 F (36.7 C)] 98.1 F (36.7 C) (05/02 2108) Pulse Rate:  [31-61] 31 (05/02 2145) Resp:  [21-23] 21 (05/02 2200) BP: (140-170)/(111-123) 158/120 (05/02 2200) SpO2:  [100 %] 100 % (05/02 2145) GENERAL: Awake, alert in NAD HEENT: - Normocephalic and atraumatic, dry mm, no LN++, no Thyromegally, left eye  seems to have subtle swelling over the eye/exophthalmos LUNGS - Clear to auscultation bilaterally with no wheezes CV - S1S2 RRR, no m/r/g, equal pulses bilaterally. ABDOMEN - Soft, nontender, nondistended with normoactive BS Ext: warm, well perfused, intact pulses  NEURO:  Mental Status: AA&Ox3  Language: speech is clear.  Naming, repetition, fluency, and comprehension intact. Cranial Nerves: PERRL.  Extraocular movement exam reveals mildly disconjugate gaze but no isolated muscle weakness-he did report seeing double when he saw me walk in the room but when he followed my fingers in all direction, no diplopia was reported, visual fields full, no facial asymmetry, facial sensation intact, hearing intact, tongue/uvula/soft palate midline, normal sternocleidomastoid and trapezius muscle strength. No evidence of tongue atrophy or fibrillations Motor: Symmetric 5/5 in all fours Tone: is normal and bulk is normal Sensation- Intact to light touch bilaterally-no extinction. Coordination: Mild left finger-nose-finger and left heel-knee-shin dysmetria noted. Gait- deferred  NIHSS 1a Level of Conscious.: 0 1b LOC Questions: 0 1c LOC Commands: 0 2 Best Gaze: 1 3 Visual: 0 4 Facial Palsy: 0 5a Motor Arm - left: 0 5b Motor Arm - Right: 0 6a Motor Leg - Left: 0 6b Motor Leg - Right: 0 7 Limb Ataxia: 2 8 Sensory: 0 9 Best Language: 0 10 Dysarthria: 0 11 Extinct. and Inatten.:  TOTAL: 3    Labs I have reviewed labs in epic and the results pertinent to this consultation are:  CBC    Component Value Date/Time   WBC 8.6 04/27/2021 2008   RBC 6.29 (H) 04/27/2021 2008   HGB 19.2 (H) 04/27/2021 2008   HGB 17.6 05/03/2018 1017   HCT 55.7 (H) 04/27/2021 2008   HCT 49.3 05/03/2018 1017   PLT 205 04/27/2021 2008   PLT 188 05/03/2018 1017   MCV 88.6 04/27/2021 2008   MCV 87 05/03/2018 1017   MCH 30.5 04/27/2021 2008   MCHC 34.5 04/27/2021 2008   RDW 14.6 04/27/2021 2008   RDW 14.6  05/03/2018 1017   LYMPHSABS 1.5 04/27/2021 2008   MONOABS 1.1 (H) 04/27/2021 2008   EOSABS 0.0 04/27/2021 2008   BASOSABS 0.0 04/27/2021 2008    CMP     Component Value Date/Time   NA 138 04/27/2021 2008   NA 137 06/13/2018 0934   K 3.6 04/27/2021 2008   CL 105 04/27/2021 2008   CO2 22 04/27/2021 2008   GLUCOSE 105 (H) 04/27/2021 2008   BUN 28 (H) 04/27/2021 2008   BUN 19 06/13/2018 0934   CREATININE 1.69 (H) 04/27/2021 2008   CREATININE 1.24 01/06/2015 0910   CALCIUM 9.4 04/27/2021 2008   PROT 7.7 04/27/2021 2008   PROT 7.3 05/03/2018 1017   ALBUMIN 3.0 (L) 04/27/2021 2008   ALBUMIN 4.0 05/03/2018 1017  AST 63 (H) 04/27/2021 2008   ALT 36 04/27/2021 2008   ALKPHOS 77 04/27/2021 2008   BILITOT 2.0 (H) 04/27/2021 2008   BILITOT 1.2 05/03/2018 1017   GFRNONAA 42 (L) 04/27/2021 2008   GFRAA >60 11/26/2018 1850    Lipid Panel     Component Value Date/Time   CHOL 157 05/03/2018 1017   TRIG 69 05/03/2018 1017   HDL 55 05/03/2018 1017   CHOLHDL 2.9 05/03/2018 1017   CHOLHDL 2.6 01/06/2015 0910   VLDL 15 01/06/2015 0910   LDLCALC 88 05/03/2018 1017   Imaging I have reviewed the images obtained:  CT-scan of the brain- right anterior thalamic hypodensity concerning for subacute infarction.  Further imaging with MRI to rule out underlying mass versus vascular malformation.  Assessment:  75 year old past history of atrial fibrillation on Coumadin, history of DVT, pleurisy, hypertension, prostate cancer, prior history of substance abuse, current intermittent marijuana use, presented in the emergency room for evaluation of gait problems and generalized weakness along with vision changes that started on 04/25/2021. Also reports hallucinations.  On examination, has mildly disconjugate gaze as well as left-sided ataxia. No other focal deficits appreciated. CT head with a subacute right thalamic stroke. Does not have any sensory deficits. I suspect that he probably has more  strokes that were visible on CT head.  His INR was subtherapeutic on presentation.  Difficult to localize current exam to the thalamic lesion seen on CT.  Impression: Right thalamic infarction Evaluate for embolic strokes involving the posterior circulation Atrial fibrillation on Coumadin-subtherapeutic INR   Recommendations: Admit to hospitalist Frequent neurochecks No need for permissive hypertension Hold Coumadin for now Aspirin 81 MRI brain with and without contrast to rule out any underlying mass versus vascular malformation CTA head and neck 2D echo A1c Lipid panel Atorvastatin 80 PT OT Speech therapy Counseled on marijuana cessation. Stroke team to follow  Plan discussed with ED resident physician Dr. Miki Kins, and Dr. Patel-admitting hospitalist  -- Amie Portland, MD Neurologist [facility] Pager: [phone]

## 2021-04-27 NOTE — ED Triage Notes (Signed)
EMS stated, when he stands up he was leaning to the left.  Pt. On 2 L . O2 sats fell to 92 %

## 2021-04-27 NOTE — ED Triage Notes (Signed)
Ems stated, pt. Started drinking alcohol on Saturday and had trouble walking and was dizzy. He was also hallucinating. Had an episode like this and was dx with dehydration.  Hx. A  fib

## 2021-04-27 NOTE — ED Notes (Signed)
Dr. Patel at bedside 

## 2021-04-28 ENCOUNTER — Other Ambulatory Visit: Payer: Self-pay

## 2021-04-28 ENCOUNTER — Encounter (HOSPITAL_COMMUNITY): Payer: Self-pay | Admitting: Internal Medicine

## 2021-04-28 ENCOUNTER — Observation Stay (HOSPITAL_COMMUNITY): Payer: Medicare Other

## 2021-04-28 DIAGNOSIS — I619 Nontraumatic intracerebral hemorrhage, unspecified: Secondary | ICD-10-CM | POA: Diagnosis present

## 2021-04-28 DIAGNOSIS — Z79899 Other long term (current) drug therapy: Secondary | ICD-10-CM | POA: Diagnosis not present

## 2021-04-28 DIAGNOSIS — Z86718 Personal history of other venous thrombosis and embolism: Secondary | ICD-10-CM | POA: Diagnosis not present

## 2021-04-28 DIAGNOSIS — E860 Dehydration: Secondary | ICD-10-CM | POA: Diagnosis present

## 2021-04-28 DIAGNOSIS — I6389 Other cerebral infarction: Secondary | ICD-10-CM

## 2021-04-28 DIAGNOSIS — I129 Hypertensive chronic kidney disease with stage 1 through stage 4 chronic kidney disease, or unspecified chronic kidney disease: Secondary | ICD-10-CM | POA: Diagnosis present

## 2021-04-28 DIAGNOSIS — R2689 Other abnormalities of gait and mobility: Secondary | ICD-10-CM | POA: Diagnosis not present

## 2021-04-28 DIAGNOSIS — F1910 Other psychoactive substance abuse, uncomplicated: Secondary | ICD-10-CM | POA: Diagnosis not present

## 2021-04-28 DIAGNOSIS — N183 Chronic kidney disease, stage 3 unspecified: Secondary | ICD-10-CM | POA: Diagnosis present

## 2021-04-28 DIAGNOSIS — R7989 Other specified abnormal findings of blood chemistry: Secondary | ICD-10-CM | POA: Diagnosis present

## 2021-04-28 DIAGNOSIS — I639 Cerebral infarction, unspecified: Secondary | ICD-10-CM

## 2021-04-28 DIAGNOSIS — B182 Chronic viral hepatitis C: Secondary | ICD-10-CM | POA: Diagnosis present

## 2021-04-28 DIAGNOSIS — R791 Abnormal coagulation profile: Secondary | ICD-10-CM | POA: Diagnosis present

## 2021-04-28 DIAGNOSIS — I4891 Unspecified atrial fibrillation: Secondary | ICD-10-CM | POA: Diagnosis not present

## 2021-04-28 DIAGNOSIS — D751 Secondary polycythemia: Secondary | ICD-10-CM | POA: Diagnosis present

## 2021-04-28 DIAGNOSIS — Z8739 Personal history of other diseases of the musculoskeletal system and connective tissue: Secondary | ICD-10-CM | POA: Diagnosis not present

## 2021-04-28 DIAGNOSIS — I428 Other cardiomyopathies: Secondary | ICD-10-CM | POA: Diagnosis present

## 2021-04-28 DIAGNOSIS — Z9079 Acquired absence of other genital organ(s): Secondary | ICD-10-CM | POA: Diagnosis not present

## 2021-04-28 DIAGNOSIS — Z823 Family history of stroke: Secondary | ICD-10-CM | POA: Diagnosis not present

## 2021-04-28 DIAGNOSIS — N179 Acute kidney failure, unspecified: Secondary | ICD-10-CM | POA: Diagnosis present

## 2021-04-28 DIAGNOSIS — I4819 Other persistent atrial fibrillation: Secondary | ICD-10-CM | POA: Diagnosis present

## 2021-04-28 DIAGNOSIS — N1831 Chronic kidney disease, stage 3a: Secondary | ICD-10-CM | POA: Diagnosis not present

## 2021-04-28 DIAGNOSIS — I10 Essential (primary) hypertension: Secondary | ICD-10-CM | POA: Diagnosis not present

## 2021-04-28 DIAGNOSIS — I4821 Permanent atrial fibrillation: Secondary | ICD-10-CM | POA: Diagnosis not present

## 2021-04-28 DIAGNOSIS — R29701 NIHSS score 1: Secondary | ICD-10-CM | POA: Diagnosis present

## 2021-04-28 DIAGNOSIS — Z8546 Personal history of malignant neoplasm of prostate: Secondary | ICD-10-CM | POA: Diagnosis not present

## 2021-04-28 DIAGNOSIS — D72829 Elevated white blood cell count, unspecified: Secondary | ICD-10-CM | POA: Diagnosis present

## 2021-04-28 DIAGNOSIS — I4892 Unspecified atrial flutter: Secondary | ICD-10-CM | POA: Diagnosis present

## 2021-04-28 DIAGNOSIS — R778 Other specified abnormalities of plasma proteins: Secondary | ICD-10-CM | POA: Diagnosis present

## 2021-04-28 DIAGNOSIS — H538 Other visual disturbances: Secondary | ICD-10-CM | POA: Diagnosis present

## 2021-04-28 DIAGNOSIS — M109 Gout, unspecified: Secondary | ICD-10-CM | POA: Diagnosis present

## 2021-04-28 DIAGNOSIS — Z20822 Contact with and (suspected) exposure to covid-19: Secondary | ICD-10-CM | POA: Diagnosis present

## 2021-04-28 DIAGNOSIS — I422 Other hypertrophic cardiomyopathy: Secondary | ICD-10-CM | POA: Diagnosis present

## 2021-04-28 LAB — ECHOCARDIOGRAM COMPLETE
Area-P 1/2: 4.86 cm2
Height: 72 in
P 1/2 time: 714 msec
S' Lateral: 3.3 cm
Weight: 3376 oz

## 2021-04-28 LAB — URINALYSIS, ROUTINE W REFLEX MICROSCOPIC
Bacteria, UA: NONE SEEN
Bilirubin Urine: NEGATIVE
Glucose, UA: NEGATIVE mg/dL
Ketones, ur: NEGATIVE mg/dL
Leukocytes,Ua: NEGATIVE
Nitrite: NEGATIVE
Protein, ur: >=300 mg/dL — AB
Specific Gravity, Urine: 1.019 (ref 1.005–1.030)
pH: 5 (ref 5.0–8.0)

## 2021-04-28 LAB — CBC
HCT: 49.2 % (ref 39.0–52.0)
Hemoglobin: 16.6 g/dL (ref 13.0–17.0)
MCH: 30.2 pg (ref 26.0–34.0)
MCHC: 33.7 g/dL (ref 30.0–36.0)
MCV: 89.6 fL (ref 80.0–100.0)
Platelets: 169 10*3/uL (ref 150–400)
RBC: 5.49 MIL/uL (ref 4.22–5.81)
RDW: 14.2 % (ref 11.5–15.5)
WBC: 9.3 10*3/uL (ref 4.0–10.5)
nRBC: 0 % (ref 0.0–0.2)

## 2021-04-28 LAB — RAPID URINE DRUG SCREEN, HOSP PERFORMED
Amphetamines: NOT DETECTED
Barbiturates: NOT DETECTED
Benzodiazepines: NOT DETECTED
Cocaine: NOT DETECTED
Opiates: NOT DETECTED
Tetrahydrocannabinol: NOT DETECTED

## 2021-04-28 LAB — LIPID PANEL
Cholesterol: 150 mg/dL (ref 0–200)
HDL: 37 mg/dL — ABNORMAL LOW (ref >40)
LDL Cholesterol: 103 mg/dL — ABNORMAL HIGH (ref 0–99)
Total CHOL/HDL Ratio: 4.1 RATIO
Triglycerides: 51 mg/dL (ref <150)
VLDL: 10 mg/dL (ref 0–40)

## 2021-04-28 LAB — TROPONIN I (HIGH SENSITIVITY)
Troponin I (High Sensitivity): 139 ng/L (ref <18)
Troponin I (High Sensitivity): 125 ng/L (ref <18)
Troponin I (High Sensitivity): 140 ng/L (ref <18)

## 2021-04-28 LAB — PROTIME-INR
INR: 1.5 — ABNORMAL HIGH (ref 0.8–1.2)
Prothrombin Time: 17.6 seconds — ABNORMAL HIGH (ref 11.4–15.2)

## 2021-04-28 LAB — COMPREHENSIVE METABOLIC PANEL
ALT: 28 U/L (ref 0–44)
AST: 40 U/L (ref 15–41)
Albumin: 2.5 g/dL — ABNORMAL LOW (ref 3.5–5.0)
Alkaline Phosphatase: 58 U/L (ref 38–126)
Anion gap: 9 (ref 5–15)
BUN: 25 mg/dL — ABNORMAL HIGH (ref 8–23)
CO2: 22 mmol/L (ref 22–32)
Calcium: 8.7 mg/dL — ABNORMAL LOW (ref 8.9–10.3)
Chloride: 107 mmol/L (ref 98–111)
Creatinine, Ser: 1.69 mg/dL — ABNORMAL HIGH (ref 0.61–1.24)
GFR, Estimated: 42 mL/min — ABNORMAL LOW (ref >60)
Glucose, Bld: 119 mg/dL — ABNORMAL HIGH (ref 70–99)
Potassium: 3.6 mmol/L (ref 3.5–5.1)
Sodium: 138 mmol/L (ref 135–145)
Total Bilirubin: 1.4 mg/dL — ABNORMAL HIGH (ref 0.3–1.2)
Total Protein: 6.2 g/dL — ABNORMAL LOW (ref 6.5–8.1)

## 2021-04-28 LAB — SARS CORONAVIRUS 2 (TAT 6-24 HRS): SARS Coronavirus 2: NEGATIVE

## 2021-04-28 LAB — HEMOGLOBIN A1C
Hgb A1c MFr Bld: 5.6 % (ref 4.8–5.6)
Mean Plasma Glucose: 114.02 mg/dL

## 2021-04-28 LAB — RPR: RPR Ser Ql: NONREACTIVE

## 2021-04-28 MED ORDER — ATORVASTATIN CALCIUM 40 MG PO TABS
40.0000 mg | ORAL_TABLET | Freq: Every day | ORAL | Status: DC
  Administered 2021-04-29 – 2021-05-01 (×3): 40 mg via ORAL
  Filled 2021-04-28 (×3): qty 1

## 2021-04-28 MED ORDER — GADOBUTROL 1 MMOL/ML IV SOLN
10.0000 mL | Freq: Once | INTRAVENOUS | Status: AC | PRN
  Administered 2021-04-28: 10 mL via INTRAVENOUS

## 2021-04-28 MED ORDER — IOHEXOL 350 MG/ML SOLN
75.0000 mL | Freq: Once | INTRAVENOUS | Status: AC | PRN
  Administered 2021-04-28: 75 mL via INTRAVENOUS

## 2021-04-28 MED ORDER — METOPROLOL TARTRATE 5 MG/5ML IV SOLN
5.0000 mg | INTRAVENOUS | Status: DC | PRN
  Filled 2021-04-28: qty 5

## 2021-04-28 NOTE — Progress Notes (Signed)
Rehab Admissions Coordinator Note:  Patient was screened by Cleatrice Burke for appropriateness for an Inpatient Acute Rehab Consult per therapy recs. .  At this time, we are recommending Inpatient Rehab consult. I will place consult per protocol and notified Dr. Ree Kida of plan.   Cleatrice Burke RN MSN 04/28/2021, 10:46 AM  I can be reached at [phone].

## 2021-04-28 NOTE — Consult Note (Signed)
Physical Medicine and Rehabilitation Consult   Reason for Consult: Stroke with functional deficits.  Referring Physician: Dr. Ree Kida  HPI: Jordan [name]. is a 75 y.o. 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  History taken from chart review, patient, and therapies.  UDS negative and  INR- 1.3 at admission. CT head showed subacute anterior right thalamic infarct and CTA head negative for LVO but showed enhancement defects proximal ICA bilaterally. MRI/MRA brain showed subacute infarct in ventral and medial right thalamus with small amount of petechial hemorrhage. Echocardiogram with EF of 45-50% with severe biatrial dilatation and mild AVR.  Carotid dopplers were negative for significant ICA stenosis. Dr. Erlinda Hong felt that stroke likely due to small vessel disease v/s subtherapeutic INR and recommended ASA with repeat CT head 5-7 days and transition to [place]. Therapy evaluations completed yesterday revealing unsteady gait with left lean, elevated BP with activity as well as diplopia and gait abnormality affecting ADLs. CIR recommended due to functional decline.    Review of Systems  Constitutional: Negative for chills and fever.  HENT: Negative for hearing loss.   Eyes: Positive for blurred vision and double vision.  Respiratory: Negative for shortness of breath.   Cardiovascular: Negative for chest pain and palpitations.  Gastrointestinal: Negative for constipation, heartburn and nausea.  Genitourinary: Negative for dysuria and urgency.  Musculoskeletal: Negative for myalgias and neck pain.  Skin: Negative for rash.  Neurological: Positive for dizziness (worse now) and weakness (has been ongoing for months). Negative for sensory change and headaches.  Psychiatric/Behavioral: Negative for hallucinations.  All other systems reviewed and are negative.   Past  Medical History:  Diagnosis Date  . Atrial fib/flutter, transient    Hx  . Atrial fibrillation (HCC)    coumadin  . Colon cancer [facility])    family hx  . Diverticulosis   . Gout   . Hemorrhoids   . Hepatitis C   . History of DVT (deep vein thrombosis)   . History of nuclear stress test 04/2002   exercise; normal study   . Hypertension   . 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 Personal history of colonic polyps   . Prostate cancer [facility])    radical prostatectomy     Past Surgical History:  Procedure Laterality Date  . CARDIAC CATHETERIZATION  2007   no occlusive CAD  . COLONOSCOPY  [id number]   [name]  . PROSTATECTOMY    . PROSTATECTOMY  04-04   Dr. Risa Grill  . TRANSTHORACIC ECHOCARDIOGRAM  05/2006   EF ~27%; LV systolic function normal; mild focal basal septal hypertrophy; AV thickness mildly increased; LA mod-markedly dilate    Family History  Problem Relation Age of Onset  . Brain cancer Mother        brain tumor  . Colon cancer Mother   . Stroke Father   . Rectal cancer Neg Hx   . Stomach cancer Neg Hx   . Esophageal cancer Neg Hx     Social History:  Lives alone and independent PTA. Used to work for school bus Doctor, hospital. He reports that he has never smoked. He has never used smokeless tobacco. He reports current alcohol--couple of glasses of wine twice week. He reports that he does use marijuana --not in month.    Allergies: No Known Allergies    Medications Prior  to Admission  Medication Sig Dispense Refill  . allopurinol (ZYLOPRIM) 100 MG tablet Take 100 mg by mouth daily.    . carvedilol (COREG) 25 MG tablet TAKE 1 TABLET(25 MG) BY MOUTH TWICE DAILY WITH A MEAL (Patient taking differently: Take 25 mg by mouth 2 (two) times daily with a meal.) 60 tablet 7  . losartan (COZAAR) 100 MG tablet Take 100 mg by mouth daily.    . sildenafil (REVATIO) 20 MG tablet Take 1 to 5 pills as needed 50 tablet 2  . warfarin (COUMADIN) 7.5  MG tablet TAKE 1 TABLET BY MOUTH DAILY AS DIRECTED BY COUMADIN CLINIC (Patient taking differently: Take 3.75-7.5 mg by mouth daily. 3.75 mg Monday and Friday. 7.5 mg Tuesday,Wednesday,Thursday,Saturday and Sunday.) 60 tablet 1    Home: Home Living Family/patient expects to be discharged to:: Private residence Living Arrangements: Alone Available Help at Discharge: Family,Available PRN/intermittently Type of Home: Apartment Home Access: Level entry Home Layout: One level Bathroom Shower/Tub: Tub/shower unit,Curtain Biochemist, clinical: Standard Bathroom Accessibility: Yes Home Equipment: Crutches  Functional History: Prior Function Level of Independence: Independent with assistive device(s) Comments: uses a crutch when he has a gout attack. drives; pays his own bills; has intentions of working out Functional Status:  Mobility: Bed Mobility Overal bed mobility: Needs Assistance Bed Mobility: Sit to Supine Supine to sit: Min assist Sit to supine: Min assist General bed mobility comments: Min A for LE assist for return to supine. Transfers Overall transfer level: Needs assistance Equipment used: 1 person hand held assist,2 person hand held assist Transfers: Sit to/from Stand Sit to Stand: Mod assist,+2 safety/equipment General transfer comment: Mod A for lift assist and steadying to stand. tended to have L lateral lean and required cues for upright posture. Mod A +2 to take side steps at EOB. Cues for sequencing and L lateral lean.      ADL: ADL Overall ADL's : Needs assistance/impaired Eating/Feeding: Modified independent Grooming: Set up,Supervision/safety,Sitting Upper Body Bathing: Set up,Supervision/ safety,Sitting Lower Body Bathing: Moderate assistance,Sit to/from stand Upper Body Dressing : Minimal assistance,Sitting Lower Body Dressing: Maximal assistance,Sit to/from stand Toilet Transfer: Moderate assistance,+2 for physical assistance,Stand-pivot Toileting- Clothing  Manipulation and Hygiene: Maximal assistance Functional mobility during ADLs: Moderate assistance,+2 for safety/equipment General ADL Comments: States LB ADL tasks have become more difficlt to copmlete. Increased SOB with minimal activity  Cognition: Cognition Overall Cognitive Status: Impaired/Different from baseline Orientation Level: Oriented X4 Cognition Arousal/Alertness: Awake/alert Behavior During Therapy: WFL for tasks assessed/performed Overall Cognitive Status: Impaired/Different from baseline Area of Impairment: Attention,Safety/judgement,Awareness,Problem solving Current Attention Level: Selective Safety/Judgement: Decreased awareness of safety,Decreased awareness of deficits Awareness: Emergent Problem Solving: Slow processing,Requires verbal cues,Requires tactile cues General Comments: leaning L - unaware of abnormal positioning of trunk   Blood pressure (!) 143/92, pulse 65, temperature 98.3 F (36.8 C), temperature source Oral, resp. rate 18, height 6' (1.829 m), weight 95.7 kg, SpO2 100 %. Physical Exam Vitals and nursing note reviewed.  Constitutional:      Appearance: Normal appearance.  HENT:     Head: Normocephalic and atraumatic.     Right Ear: External ear normal.     Left Ear: External ear normal.     Nose: Nose normal.  Eyes:     General:        Right eye: No discharge.        Left eye: No discharge.  Cardiovascular:     Comments: Irregularly irregular Pulmonary:     Effort: Pulmonary effort is normal. No respiratory  distress.     Breath sounds: Normal breath sounds.  Abdominal:     General: Abdomen is flat. Bowel sounds are normal. There is no distension.  Musculoskeletal:        General: No swelling or tenderness.     Cervical back: Normal range of motion and neck supple.     Comments: No edema or tenderness in extremities  Skin:    General: Skin is warm and dry.  Neurological:     Mental Status: He is alert and oriented to person, place,  and time.     Comments: Left pronator drift.  Disconjugate gaze.  Motor: RUE/RLE: 5/5 proximal distal LUE/LLE: 4+/5 proximal to distal Bilateral upper extremity dysmetria  Psychiatric:        Mood and Affect: Mood normal.        Behavior: Behavior normal.        Thought Content: Thought content normal.     Results for orders placed or performed during the hospital encounter of 04/27/21 (from the past 24 hour(s))  CBC with Differential/Platelet     Status: Abnormal   Collection Time: 04/27/21  8:08 PM  Result Value Ref Range   WBC 8.6 4.0 - 10.5 K/uL   RBC 6.29 (H) 4.22 - 5.81 MIL/uL   Hemoglobin 19.2 (H) 13.0 - 17.0 g/dL   HCT 55.7 (H) 39.0 - 52.0 %   MCV 88.6 80.0 - 100.0 fL   MCH 30.5 26.0 - 34.0 pg   MCHC 34.5 30.0 - 36.0 g/dL   RDW 14.6 11.5 - 15.5 %   Platelets 205 150 - 400 K/uL   nRBC 0.0 0.0 - 0.2 %   Neutrophils Relative % 66 %   Neutro Abs 5.8 1.7 - 7.7 K/uL   Lymphocytes Relative 18 %   Lymphs Abs 1.5 0.7 - 4.0 K/uL   Monocytes Relative 13 %   Monocytes Absolute 1.1 (H) 0.1 - 1.0 K/uL   Eosinophils Relative 1 %   Eosinophils Absolute 0.0 0.0 - 0.5 K/uL   Basophils Relative 1 %   Basophils Absolute 0.0 0.0 - 0.1 K/uL   Immature Granulocytes 1 %   Abs Immature Granulocytes 0.06 0.00 - 0.07 K/uL  Comprehensive metabolic panel     Status: Abnormal   Collection Time: 04/27/21  8:08 PM  Result Value Ref Range   Sodium 138 135 - 145 mmol/L   Potassium 3.6 3.5 - 5.1 mmol/L   Chloride 105 98 - 111 mmol/L   CO2 22 22 - 32 mmol/L   Glucose, Bld 105 (H) 70 - 99 mg/dL   BUN 28 (H) 8 - 23 mg/dL   Creatinine, Ser 1.69 (H) 0.61 - 1.24 mg/dL   Calcium 9.4 8.9 - 10.3 mg/dL   Total Protein 7.7 6.5 - 8.1 g/dL   Albumin 3.0 (L) 3.5 - 5.0 g/dL   AST 63 (H) 15 - 41 U/L   ALT 36 0 - 44 U/L   Alkaline Phosphatase 77 38 - 126 U/L   Total Bilirubin 2.0 (H) 0.3 - 1.2 mg/dL   GFR, Estimated 42 (L) >60 mL/min   Anion gap 11 5 - 15  Ethanol     Status: None   Collection Time:  04/27/21 10:13 PM  Result Value Ref Range   Alcohol, Ethyl (B) <10 <10 mg/dL  TSH     Status: None   Collection Time: 04/27/21 10:13 PM  Result Value Ref Range   TSH 2.994 0.350 - 4.500 uIU/mL  Brain natriuretic  peptide     Status: Abnormal   Collection Time: 04/27/21 10:13 PM  Result Value Ref Range   B Natriuretic Peptide 446.7 (H) 0.0 - 100.0 pg/mL  RPR     Status: None   Collection Time: 04/27/21 10:13 PM  Result Value Ref Range   RPR Ser Ql NON REACTIVE NON REACTIVE  Protime-INR     Status: Abnormal   Collection Time: 04/27/21 10:13 PM  Result Value Ref Range   Prothrombin Time 16.6 (H) 11.4 - 15.2 seconds   INR 1.3 (H) 0.8 - 1.2  Magnesium     Status: None   Collection Time: 04/27/21 10:13 PM  Result Value Ref Range   Magnesium 2.0 1.7 - 2.4 mg/dL  Troponin I (High Sensitivity)     Status: Abnormal   Collection Time: 04/27/21 10:13 PM  Result Value Ref Range   Troponin I (High Sensitivity) 139 (HH) <18 ng/L  Urinalysis, Routine w reflex microscopic     Status: Abnormal   Collection Time: 04/27/21 11:11 PM  Result Value Ref Range   Color, Urine AMBER (A) YELLOW   APPearance HAZY (A) CLEAR   Specific Gravity, Urine 1.019 1.005 - 1.030   pH 5.0 5.0 - 8.0   Glucose, UA NEGATIVE NEGATIVE mg/dL   Hgb urine dipstick SMALL (A) NEGATIVE   Bilirubin Urine NEGATIVE NEGATIVE   Ketones, ur NEGATIVE NEGATIVE mg/dL   Protein, ur >=300 (A) NEGATIVE mg/dL   Nitrite NEGATIVE NEGATIVE   Leukocytes,Ua NEGATIVE NEGATIVE   RBC / HPF 0-5 0 - 5 RBC/hpf   WBC, UA 0-5 0 - 5 WBC/hpf   Bacteria, UA NONE SEEN NONE SEEN   Squamous Epithelial / LPF 0-5 0 - 5   Mucus PRESENT   Troponin I (High Sensitivity)     Status: Abnormal   Collection Time: 04/28/21  1:00 AM  Result Value Ref Range   Troponin I (High Sensitivity) 125 (HH) <18 ng/L  SARS CORONAVIRUS 2 (TAT 6-24 HRS) Nasopharyngeal Nasopharyngeal Swab     Status: None   Collection Time: 04/28/21  4:41 AM   Specimen: Nasopharyngeal Swab   Result Value Ref Range   SARS Coronavirus 2 NEGATIVE NEGATIVE  Comprehensive metabolic panel     Status: Abnormal   Collection Time: 04/28/21  4:46 AM  Result Value Ref Range   Sodium 138 135 - 145 mmol/L   Potassium 3.6 3.5 - 5.1 mmol/L   Chloride 107 98 - 111 mmol/L   CO2 22 22 - 32 mmol/L   Glucose, Bld 119 (H) 70 - 99 mg/dL   BUN 25 (H) 8 - 23 mg/dL   Creatinine, Ser 1.69 (H) 0.61 - 1.24 mg/dL   Calcium 8.7 (L) 8.9 - 10.3 mg/dL   Total Protein 6.2 (L) 6.5 - 8.1 g/dL   Albumin 2.5 (L) 3.5 - 5.0 g/dL   AST 40 15 - 41 U/L   ALT 28 0 - 44 U/L   Alkaline Phosphatase 58 38 - 126 U/L   Total Bilirubin 1.4 (H) 0.3 - 1.2 mg/dL   GFR, Estimated 42 (L) >60 mL/min   Anion gap 9 5 - 15  CBC     Status: None   Collection Time: 04/28/21  4:46 AM  Result Value Ref Range   WBC 9.3 4.0 - 10.5 K/uL   RBC 5.49 4.22 - 5.81 MIL/uL   Hemoglobin 16.6 13.0 - 17.0 g/dL   HCT 49.2 39.0 - 52.0 %   MCV 89.6 80.0 - 100.0 fL  MCH 30.2 26.0 - 34.0 pg   MCHC 33.7 30.0 - 36.0 g/dL   RDW 14.2 11.5 - 15.5 %   Platelets 169 150 - 400 K/uL   nRBC 0.0 0.0 - 0.2 %  Protime-INR     Status: Abnormal   Collection Time: 04/28/21  4:46 AM  Result Value Ref Range   Prothrombin Time 17.6 (H) 11.4 - 15.2 seconds   INR 1.5 (H) 0.8 - 1.2  Troponin I (High Sensitivity)     Status: Abnormal   Collection Time: 04/28/21  4:46 AM  Result Value Ref Range   Troponin I (High Sensitivity) 140 (HH) <18 ng/L  Lipid panel     Status: Abnormal   Collection Time: 04/28/21  4:46 AM  Result Value Ref Range   Cholesterol 150 0 - 200 mg/dL   Triglycerides 51 <150 mg/dL   HDL 37 (L) >40 mg/dL   Total CHOL/HDL Ratio 4.1 RATIO   VLDL 10 0 - 40 mg/dL   LDL Cholesterol 103 (H) 0 - 99 mg/dL  Hemoglobin A1c     Status: None   Collection Time: 04/28/21  4:47 AM  Result Value Ref Range   Hgb A1c MFr Bld 5.6 4.8 - 5.6 %   Mean Plasma Glucose 114.02 mg/dL  Urine rapid drug screen (hosp performed)     Status: None   Collection  Time: 04/28/21  7:23 AM  Result Value Ref Range   Opiates NONE DETECTED NONE DETECTED   Cocaine NONE DETECTED NONE DETECTED   Benzodiazepines NONE DETECTED NONE DETECTED   Amphetamines NONE DETECTED NONE DETECTED   Tetrahydrocannabinol NONE DETECTED NONE DETECTED   Barbiturates NONE DETECTED NONE DETECTED   DG Chest 2 View  Result Date: 04/27/2021 CLINICAL DATA:  Weak and dizzy EXAM: CHEST - 2 VIEW COMPARISON:  10/20/2018 FINDINGS: Mild cardiomegaly. No focal opacity or pleural effusion. Aortic atherosclerosis. No pneumothorax. IMPRESSION: No active cardiopulmonary disease.  Borderline to mild cardiomegaly Electronically Signed   By: Donavan Foil M.D.   On: 04/27/2021 19:28   CT Head Wo Contrast  Result Date: 04/27/2021 CLINICAL DATA:  Dizziness and blurred vision. EXAM: CT HEAD WITHOUT CONTRAST TECHNIQUE: Contiguous axial images were obtained from the base of the skull through the vertex without intravenous contrast. COMPARISON:  October 20, 2018 FINDINGS: Brain: There is mild cerebral atrophy with widening of the extra-axial spaces and ventricular dilatation. There are areas of decreased attenuation within the white matter tracts of the supratentorial brain, consistent with microvascular disease changes. A 2.4 cm x 1.5 cm ill-defined area of low attenuation is seen within the anterior thalamic region on the right (axial CT image 16, CT series 4). This represents a new finding when compared to the prior exam. Very mild associated mass effect is seen with approximately 2 mm right to left midline shift (coronal reformatted image 40, CT series number 5). No associated acute hemorrhage is identified. Vascular: No hyperdense vessel or unexpected calcification. Skull: Normal. Negative for fracture or focal lesion. Sinuses/Orbits: No acute finding. Other: None. IMPRESSION: Subacute anterior right thalamic infarct, as described above. MRI correlation is recommended. Electronically Signed   By: Virgina Norfolk M.D.   On: 04/27/2021 21:47   MR ANGIO HEAD WO CONTRAST  Result Date: 04/28/2021 CLINICAL DATA:  Neuro deficit, acute, stroke suspected; Neuro deficit, acute, stroke suspected Left cranial nerve 6 palsy EXAM: MRI HEAD WITHOUT AND WITH CONTRAST MRA HEAD WITHOUT CONTRAST TECHNIQUE: Multiplanar, multiecho pulse sequences of the brain and surrounding structures were  obtained without and with intravenous contrast. Angiographic images of the head were obtained using MRA technique without contrast. CONTRAST:  52mL GADAVIST GADOBUTROL 1 MMOL/ML IV SOLN COMPARISON:  None. FINDINGS: MRI HEAD FINDINGS Brain: Subacute infarct of the ventral and medial right thalamus. There is small amount of petechial hemorrhage without mass effect. No chronic microhemorrhage. There is multifocal hyperintense T2-weighted signal within the white matter. Generalized volume loss without a clear lobar predilection. Old right cerebellar infarct. There is no abnormal contrast enhancement. Vascular: Major flow voids are preserved. Skull and upper cervical spine: Normal calvarium and skull base. Visualized upper cervical spine and soft tissues are normal. Sinuses/Orbits:No paranasal sinus fluid levels or advanced mucosal thickening. No mastoid or middle ear effusion. Normal orbits. MRA HEAD FINDINGS POSTERIOR CIRCULATION: --Vertebral arteries: Normal --Inferior cerebellar arteries: Normal. --Basilar artery: Normal. --Superior cerebellar arteries: Normal. --Posterior cerebral arteries: Normal. The right PCA is partially supplied by a posterior communicating artery (p-comm). ANTERIOR CIRCULATION: --Intracranial internal carotid arteries: Normal. --Anterior cerebral arteries (ACA): Normal. --Middle cerebral arteries (MCA): Normal. ANATOMIC VARIANTS: None IMPRESSION: 1. Subacute infarct of the ventral and medial right thalamus with small amount of petechial hemorrhage without mass effect, Heidelberg classification 1b: HI2, confluent petechiae,  no mass effect. 2. Normal intracranial MRA. Electronically Signed   By: Ulyses Jarred M.D.   On: 04/28/2021 01:18   MR Brain W and Wo Contrast  Result Date: 04/28/2021 CLINICAL DATA:  Neuro deficit, acute, stroke suspected; Neuro deficit, acute, stroke suspected Left cranial nerve 6 palsy EXAM: MRI HEAD WITHOUT AND WITH CONTRAST MRA HEAD WITHOUT CONTRAST TECHNIQUE: Multiplanar, multiecho pulse sequences of the brain and surrounding structures were obtained without and with intravenous contrast. Angiographic images of the head were obtained using MRA technique without contrast. CONTRAST:  42mL GADAVIST GADOBUTROL 1 MMOL/ML IV SOLN COMPARISON:  None. FINDINGS: MRI HEAD FINDINGS Brain: Subacute infarct of the ventral and medial right thalamus. There is small amount of petechial hemorrhage without mass effect. No chronic microhemorrhage. There is multifocal hyperintense T2-weighted signal within the white matter. Generalized volume loss without a clear lobar predilection. Old right cerebellar infarct. There is no abnormal contrast enhancement. Vascular: Major flow voids are preserved. Skull and upper cervical spine: Normal calvarium and skull base. Visualized upper cervical spine and soft tissues are normal. Sinuses/Orbits:No paranasal sinus fluid levels or advanced mucosal thickening. No mastoid or middle ear effusion. Normal orbits. MRA HEAD FINDINGS POSTERIOR CIRCULATION: --Vertebral arteries: Normal --Inferior cerebellar arteries: Normal. --Basilar artery: Normal. --Superior cerebellar arteries: Normal. --Posterior cerebral arteries: Normal. The right PCA is partially supplied by a posterior communicating artery (p-comm). ANTERIOR CIRCULATION: --Intracranial internal carotid arteries: Normal. --Anterior cerebral arteries (ACA): Normal. --Middle cerebral arteries (MCA): Normal. ANATOMIC VARIANTS: None IMPRESSION: 1. Subacute infarct of the ventral and medial right thalamus with small amount of petechial hemorrhage  without mass effect, Heidelberg classification 1b: HI2, confluent petechiae, no mass effect. 2. Normal intracranial MRA. Electronically Signed   By: Ulyses Jarred M.D.   On: 04/28/2021 01:18   ECHOCARDIOGRAM COMPLETE  Result Date: 04/28/2021    ECHOCARDIOGRAM REPORT   Patient Name:   Jordan [name]. Date of Exam: 04/28/2021 Medical Rec #:  [id number]             Height:       72.0 in Accession #:    [id number]            Weight:       211.0 lb Date of Birth:  Oct 06, 1946  BSA:          2.180 m Patient Age:    75 years              BP:           148/87 mmHg Patient Gender: M                     HR:           105 bpm. Exam Location:  Inpatient Procedure: 2D Echo Indications:    Stroke I63.9  History:        Patient has prior history of Echocardiogram examinations, most                 recent 12/07/2018. Arrythmias:Atrial Fibrillation; Risk                 Factors:Hypertension.  Sonographer:    Thurman Coyer RDCS (AE) Referring Phys: [id number] VISHAL R Aalaysia Liggins IMPRESSIONS  1. Atrial fibrillation present. Left ventricular ejection fraction, by estimation, is 45 to 50%. The left ventricle has mildly decreased function. The left ventricle demonstrates regional wall motion abnormalities (see scoring diagram/findings for description). Left ventricular diastolic parameters are indeterminate.  2. Right ventricular systolic function is normal. The right ventricular size is mildly enlarged. There is normal pulmonary artery systolic pressure.  3. Left atrial size was severely dilated.  4. Right atrial size was severely dilated.  5. The mitral valve is normal in structure. No evidence of mitral valve regurgitation. No evidence of mitral stenosis.  6. The aortic valve is tricuspid. Aortic valve regurgitation is mild. Mild to moderate aortic valve sclerosis/calcification is present, without any evidence of aortic stenosis.  7. There is borderline dilatation of the ascending aorta, measuring 39 mm.  8. The inferior  vena cava is normal in size with greater than 50% respiratory variability, suggesting right atrial pressure of 3 mmHg. FINDINGS  Left Ventricle: Atrial fibrillation present. Left ventricular ejection fraction, by estimation, is 45 to 50%. The left ventricle has mildly decreased function. The left ventricle demonstrates regional wall motion abnormalities. The left ventricular internal cavity size was normal in size. There is no left ventricular hypertrophy. Left ventricular diastolic parameters are indeterminate.  LV Wall Scoring: The basal inferior segment is akinetic. The mid inferoseptal segment, mid inferior segment, and basal inferoseptal segment are hypokinetic. Right Ventricle: The right ventricular size is mildly enlarged. No increase in right ventricular wall thickness. Right ventricular systolic function is normal. There is normal pulmonary artery systolic pressure. The tricuspid regurgitant velocity is 2.14  m/s, and with an assumed right atrial pressure of 3 mmHg, the estimated right ventricular systolic pressure is 21.3 mmHg. Left Atrium: Left atrial size was severely dilated. Right Atrium: Right atrial size was severely dilated. Pericardium: There is no evidence of pericardial effusion. Mitral Valve: The mitral valve is normal in structure. No evidence of mitral valve regurgitation. No evidence of mitral valve stenosis. Tricuspid Valve: The tricuspid valve is normal in structure. Tricuspid valve regurgitation is not demonstrated. No evidence of tricuspid stenosis. Aortic Valve: The aortic valve is tricuspid. Aortic valve regurgitation is mild. Aortic regurgitation PHT measures 714 msec. Mild to moderate aortic valve sclerosis/calcification is present, without any evidence of aortic stenosis. Pulmonic Valve: The pulmonic valve was normal in structure. Pulmonic valve regurgitation is not visualized. No evidence of pulmonic stenosis. Aorta: The aortic root is normal in size and structure. There is  borderline dilatation of the ascending aorta, measuring 39 mm. Venous:  The inferior vena cava is normal in size with greater than 50% respiratory variability, suggesting right atrial pressure of 3 mmHg. IAS/Shunts: No atrial level shunt detected by color flow Doppler.  LEFT VENTRICLE PLAX 2D LVIDd:         4.70 cm LVIDs:         3.30 cm LV PW:         1.50 cm LV IVS:        1.30 cm LVOT diam:     2.30 cm LV SV:         33 LV SV Index:   15 LVOT Area:     4.15 cm  RIGHT VENTRICLE RV S prime:     8.25 cm/s LEFT ATRIUM            Index       RIGHT ATRIUM           Index LA diam:      5.90 cm  2.71 cm/m  RA Area:     26.40 cm LA Vol (A2C): 100.0 ml 45.88 ml/m RA Volume:   92.70 ml  42.53 ml/m LA Vol (A4C): 127.0 ml 58.27 ml/m  AORTIC VALVE LVOT Vmax:   69.20 cm/s LVOT Vmean:  49.500 cm/s LVOT VTI:    0.081 m AI PHT:      714 msec  AORTA Ao Root diam: 3.30 cm MITRAL VALVE               TRICUSPID VALVE MV Area (PHT): 4.86 cm    TR Peak grad:   18.3 mmHg MV Decel Time: 156 msec    TR Vmax:        214.00 cm/s MV E velocity: 74.60 cm/s MV A velocity: 29.60 cm/s  SHUNTS MV E/A ratio:  2.52        Systemic VTI:  0.08 m                            Systemic Diam: 2.30 cm Candee Furbish MD Electronically signed by Candee Furbish MD Signature Date/Time: 04/28/2021/11:04:13 AM    Final    VAS US CAROTID  Result Date: 04/28/2021 Carotid Arterial Duplex Study Patient Name:  Bertel Konefal.  Date of Exam:   04/28/2021 Medical Rec #: [id number]              Accession #:    [id number] Date of Birth: 1946-10-12               Patient Gender: M Patient Age:   75Y Exam Location:  [facility] Procedure:      VAS US CAROTID Referring Phys: [id number] Rosalin Hawking --------------------------------------------------------------------------------  Indications:       CVA. Bilateral carotid bulb abnormality seen on CTA. Risk Factors:      Hypertension. Comparison Study:  No prior study Performing Technologist: Maudry Mayhew MHA, RDMS,  RVT, RDCS  Examination Guidelines: A complete evaluation includes B-mode imaging, spectral Doppler, color Doppler, and power Doppler as needed of all accessible portions of each vessel. Bilateral testing is considered an integral part of a complete examination. Limited examinations for reoccurring indications may be performed as noted.  Right Carotid Findings: +----------+--------+--------+--------+------------------+--------+           PSV cm/sEDV cm/sStenosisPlaque DescriptionComments +----------+--------+--------+--------+------------------+--------+ CCA Prox  50      12                                         +----------+--------+--------+--------+------------------+--------+  CCA Distal47      15                                         +----------+--------+--------+--------+------------------+--------+ ICA Prox  20      6                                          +----------+--------+--------+--------+------------------+--------+ ICA Distal35      9                                          +----------+--------+--------+--------+------------------+--------+ ECA       49      8                                          +----------+--------+--------+--------+------------------+--------+ +----------+--------+-------+----------------+-------------------+           PSV cm/sEDV cmsDescribe        Arm Pressure (mmHG) +----------+--------+-------+----------------+-------------------+ [id number]             Multiphasic, WNL                    +----------+--------+-------+----------------+-------------------+ +---------+--------+--+--------+-+---------+ VertebralPSV cm/s14EDV cm/s5Antegrade +---------+--------+--+--------+-+---------+  Left Carotid Findings: +----------+--------+--------+--------+------------------+--------+           PSV cm/sEDV cm/sStenosisPlaque DescriptionComments +----------+--------+--------+--------+------------------+--------+ CCA  Prox  45      13                                         +----------+--------+--------+--------+------------------+--------+ CCA Distal34      9                                          +----------+--------+--------+--------+------------------+--------+ ICA Prox  13      6                                          +----------+--------+--------+--------+------------------+--------+ ICA Distal31      11                                         +----------+--------+--------+--------+------------------+--------+ ECA       64      13                                         +----------+--------+--------+--------+------------------+--------+ +----------+--------+--------+----------------+-------------------+           PSV cm/sEDV cm/sDescribe        Arm Pressure (mmHG) +----------+--------+--------+----------------+-------------------+ [id number]              Multiphasic, WNL                    +----------+--------+--------+----------------+-------------------+ +---------+--------+--+--------+-+---------+  VertebralPSV cm/s19EDV cm/s8Antegrade +---------+--------+--+--------+-+---------+   Summary: Right Carotid: The extracranial vessels were near-normal with only minimal wall                thickening or plaque. Left Carotid: The extracranial vessels were near-normal with only minimal wall               thickening or plaque. Vertebrals:  Bilateral vertebral arteries demonstrate antegrade flow. Subclavians: Normal flow hemodynamics were seen in bilateral subclavian              arteries. *See table(s) above for measurements and observations.     Preliminary    CT ANGIO HEAD CODE STROKE  Result Date: 04/28/2021 CLINICAL DATA:  Dizziness and hallucinations EXAM: CT ANGIOGRAPHY HEAD AND NECK TECHNIQUE: Multidetector CT imaging of the head and neck was performed using the standard protocol during bolus administration of intravenous contrast. Multiplanar CT image  reconstructions and MIPs were obtained to evaluate the vascular anatomy. Carotid stenosis measurements (when applicable) are obtained utilizing NASCET criteria, using the distal internal carotid diameter as the denominator. CONTRAST:  1mL OMNIPAQUE IOHEXOL 350 MG/ML SOLN COMPARISON:  MRA brain 04/28/2021 FINDINGS: CTA NECK FINDINGS SKELETON: There is no bony spinal canal stenosis. No lytic or blastic lesion. OTHER NECK: Normal pharynx, larynx and major salivary glands. No cervical lymphadenopathy. Unremarkable thyroid gland. UPPER CHEST: No pneumothorax or pleural effusion. No nodules or masses. AORTIC ARCH: There is calcific atherosclerosis of the aortic arch. There is no aneurysm, dissection or hemodynamically significant stenosis of the visualized portion of the aorta. Conventional 3 vessel aortic branching pattern. The visualized proximal subclavian arteries are widely patent. CAROTID SYSTEMS: There are symmetric, curvilinear enhancement defects within both proximal internal carotid arteries. There is no stenosis of either carotid system. VERTEBRAL ARTERIES: Codominant configuration. Both origins are clearly patent. There is no dissection, occlusion or flow-limiting stenosis to the skull base (V1-V3 segments). CTA HEAD FINDINGS POSTERIOR CIRCULATION: --Vertebral arteries: Normal V4 segments. --Inferior cerebellar arteries: Normal. --Basilar artery: Normal. --Superior cerebellar arteries: Normal. --Posterior cerebral arteries (PCA): Normal. The right PCA is partially supplied by a posterior communicating artery (p-comm). ANTERIOR CIRCULATION: --Intracranial internal carotid arteries: Moderate narrowing of the right ICA at the skull base. Mild bilateral atherosclerotic calcification. --Anterior cerebral arteries (ACA): Normal. Both A1 segments are present. Patent anterior communicating artery (a-comm). --Middle cerebral arteries (MCA): Normal. VENOUS SINUSES: As permitted by contrast timing, patent. ANATOMIC  VARIANTS: None Review of the MIP images confirms the above findings. IMPRESSION: 1. No intracranial arterial occlusion or high-grade stenosis. 2. Symmetric, curvilinear enhancement defects within both proximal internal carotid arteries, favored to be artifacts related to flow turbulence. Carotid web is a secondary consideration, but the appearance is not typical. Dissection or thromboembolism are unlikely given the symmetry. 3. 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 Aortic Atherosclerosis (ICD10-I70.0). Electronically Signed   By: Ulyses Jarred M.D.   On: 04/28/2021 02:30   CT ANGIO NECK CODE STROKE  Result Date: 04/28/2021 CLINICAL DATA:  Dizziness and hallucinations EXAM: CT ANGIOGRAPHY HEAD AND NECK TECHNIQUE: Multidetector CT imaging of the head and neck was performed using the standard protocol during bolus administration of intravenous contrast. Multiplanar CT image reconstructions and MIPs were obtained to evaluate the vascular anatomy. Carotid stenosis measurements (when applicable) are obtained utilizing NASCET criteria, using the distal internal carotid diameter as the denominator. CONTRAST:  68mL OMNIPAQUE IOHEXOL 350 MG/ML SOLN COMPARISON:  MRA brain 04/28/2021 FINDINGS: CTA NECK FINDINGS SKELETON: There is no  bony spinal canal stenosis. No lytic or blastic lesion. OTHER NECK: Normal pharynx, larynx and major salivary glands. No cervical lymphadenopathy. Unremarkable thyroid gland. UPPER CHEST: No pneumothorax or pleural effusion. No nodules or masses. AORTIC ARCH: There is calcific atherosclerosis of the aortic arch. There is no aneurysm, dissection or hemodynamically significant stenosis of the visualized portion of the aorta. Conventional 3 vessel aortic branching pattern. The visualized proximal subclavian arteries are widely patent. CAROTID SYSTEMS: There are symmetric, curvilinear enhancement defects within both proximal internal carotid arteries. There is no  stenosis of either carotid system. VERTEBRAL ARTERIES: Codominant configuration. Both origins are clearly patent. There is no dissection, occlusion or flow-limiting stenosis to the skull base (V1-V3 segments). CTA HEAD FINDINGS POSTERIOR CIRCULATION: --Vertebral arteries: Normal V4 segments. --Inferior cerebellar arteries: Normal. --Basilar artery: Normal. --Superior cerebellar arteries: Normal. --Posterior cerebral arteries (PCA): Normal. The right PCA is partially supplied by a posterior communicating artery (p-comm). ANTERIOR CIRCULATION: --Intracranial internal carotid arteries: Moderate narrowing of the right ICA at the skull base. Mild bilateral atherosclerotic calcification. --Anterior cerebral arteries (ACA): Normal. Both A1 segments are present. Patent anterior communicating artery (a-comm). --Middle cerebral arteries (MCA): Normal. VENOUS SINUSES: As permitted by contrast timing, patent. ANATOMIC VARIANTS: None Review of the MIP images confirms the above findings. IMPRESSION: 1. No intracranial arterial occlusion or high-grade stenosis. 2. Symmetric, curvilinear enhancement defects within both proximal internal carotid arteries, favored to be artifacts related to flow turbulence. Carotid web is a secondary consideration, but the appearance is not typical. Dissection or thromboembolism are unlikely given the symmetry. 3. 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 Aortic Atherosclerosis (ICD10-I70.0). Electronically Signed   By: Ulyses Jarred M.D.   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  Stroke: Continue secondary stroke prophylaxis and Risk Factor Modification listed below:   Antiplatelet therapy:   Blood Pressure Management:  Continue current medication with prn's with permisive HTN per primary team Statin Agent:   Labs independently reviewed.  Records  reviewed and summated above.  1. Does the need for close, 24 hr/day medical supervision in concert with the patient's rehab needs make it unreasonable for this patient to be served in a less intensive setting? Yes  2. 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 Due to safety, disease management and patient education, does the patient require 24 hr/day rehab nursing? Yes 4. Does the patient require coordinated care of a physician, rehab nurse, therapy disciplines of PT/OT to address physical and functional deficits in the context of the above medical diagnosis(es)? Yes Addressing deficits in the following areas: balance, endurance, locomotion, strength, transferring, bathing, dressing, grooming, toileting and psychosocial support 5. Can the patient actively participate in an intensive therapy program of at least 3 hrs of therapy per day at least 5 days per week? Yes 6. The potential for patient to make measurable gains while on inpatient rehab is excellent 7. Anticipated functional outcomes upon discharge from inpatient rehab are supervision  with PT, supervision with OT, n/a with SLP. 8. Estimated rehab length of stay to reach the above functional goals is: 10-14 days. 9. Anticipated discharge destination: Home 10. Overall Rehab/Functional Prognosis: good  RECOMMENDATIONS: This patient's condition is appropriate for  continued rehabilitative care in the following setting: CIR caregiver support available upon discharge. Patient has agreed to participate in recommended program. Yes Note that insurance prior authorization may be required for reimbursement for recommended care.  Comment: Rehab Admissions Coordinator to  follow up.  I have personally performed a face to face diagnostic evaluation, including, but not limited to relevant history and physical exam findings, of this patient and developed relevant assessment and plan.  Additionally, I have reviewed and concur with the physician assistant's documentation above.   Delice Lesch, MD, ABPMR Bary Leriche, PA-C 04/28/2021

## 2021-04-28 NOTE — Progress Notes (Signed)
Carotid artery duplex completed. Refer to "CV Proc" under chart review to view preliminary results.  04/28/2021 3:32 PM Kelby Aline., MHA, RVT, RDCS, RDMS

## 2021-04-28 NOTE — Progress Notes (Signed)
STROKE TEAM PROGRESS NOTE   SUBJECTIVE (INTERVAL HISTORY) No family is at the bedside.  Overall his condition is stable. Pt lying in bed, stated that his diplopia has resolved. Currently he still has mild SOB. HR 90s -100s in afib.    OBJECTIVE Temp:  [97.7 F (36.5 C)-98.1 F (36.7 C)] 98 F (36.7 C) (05/03 1200) Pulse Rate:  [31-114] 85 (05/03 0522) Cardiac Rhythm: Atrial fibrillation (05/03 1200) Resp:  [9-23] 19 (05/03 1200) BP: (119-173)/(88-133) 119/94 (05/03 1200) SpO2:  [98 %-100 %] 98 % (05/03 1200) Weight:  [95.7 kg] 95.7 kg (05/03 0552)  No results for input(s): GLUCAP in the last 168 hours. Recent Labs  Lab 04/27/21 2008 04/27/21 2213 04/28/21 0446  NA 138  --  138  K 3.6  --  3.6  CL 105  --  107  CO2 22  --  22  GLUCOSE 105*  --  119*  BUN 28*  --  25*  CREATININE 1.69*  --  1.69*  CALCIUM 9.4  --  8.7*  MG  --  2.0  --    Recent Labs  Lab 04/27/21 2008 04/28/21 0446  AST 63* 40  ALT 36 28  ALKPHOS 77 58  BILITOT 2.0* 1.4*  PROT 7.7 6.2*  ALBUMIN 3.0* 2.5*   Recent Labs  Lab 04/27/21 2008 04/28/21 0446  WBC 8.6 9.3  NEUTROABS 5.8  --   HGB 19.2* 16.6  HCT 55.7* 49.2  MCV 88.6 89.6  PLT 205 169   No results for input(s): CKTOTAL, CKMB, CKMBINDEX, TROPONINI in the last 168 hours. Recent Labs    04/27/21 2213 04/28/21 0446  LABPROT 16.6* 17.6*  INR 1.3* 1.5*   Recent Labs    04/27/21 2311  COLORURINE AMBER*  LABSPEC 1.019  PHURINE 5.0  GLUCOSEU NEGATIVE  HGBUR SMALL*  BILIRUBINUR NEGATIVE  KETONESUR NEGATIVE  PROTEINUR >=300*  NITRITE NEGATIVE  LEUKOCYTESUR NEGATIVE       Component Value Date/Time   CHOL 150 04/28/2021 0446   CHOL 157 05/03/2018 1017   TRIG 51 04/28/2021 0446   HDL 37 (L) 04/28/2021 0446   HDL 55 05/03/2018 1017   CHOLHDL 4.1 04/28/2021 0446   VLDL 10 04/28/2021 0446   LDLCALC 103 (H) 04/28/2021 0446   LDLCALC 88 05/03/2018 1017   Lab Results  Component Value Date   HGBA1C 5.6 04/28/2021       Component Value Date/Time   LABOPIA NONE DETECTED 04/28/2021 0723   COCAINSCRNUR NONE DETECTED 04/28/2021 0723   LABBENZ NONE DETECTED 04/28/2021 0723   AMPHETMU NONE DETECTED 04/28/2021 0723   THCU NONE DETECTED 04/28/2021 0723   LABBARB NONE DETECTED 04/28/2021 0723    Recent Labs  Lab 04/27/21 2213  ETH <10    I have personally reviewed the radiological images below and agree with the radiology interpretations.  DG Chest 2 View  Result Date: 04/27/2021 CLINICAL DATA:  Weak and dizzy EXAM: CHEST - 2 VIEW COMPARISON:  10/20/2018 FINDINGS: Mild cardiomegaly. No focal opacity or pleural effusion. Aortic atherosclerosis. No pneumothorax. IMPRESSION: No active cardiopulmonary disease.  Borderline to mild cardiomegaly Electronically Signed   By: Donavan Foil M.D.   On: 04/27/2021 19:28   CT Head Wo Contrast  Result Date: 04/27/2021 CLINICAL DATA:  Dizziness and blurred vision. EXAM: CT HEAD WITHOUT CONTRAST TECHNIQUE: Contiguous axial images were obtained from the base of the skull through the vertex without intravenous contrast. COMPARISON:  October 20, 2018 FINDINGS: Brain: There is mild cerebral atrophy with  widening of the extra-axial spaces and ventricular dilatation. There are areas of decreased attenuation within the white matter tracts of the supratentorial brain, consistent with microvascular disease changes. A 2.4 cm x 1.5 cm ill-defined area of low attenuation is seen within the anterior thalamic region on the right (axial CT image 16, CT series 4). This represents a new finding when compared to the prior exam. Very mild associated mass effect is seen with approximately 2 mm right to left midline shift (coronal reformatted image 40, CT series number 5). No associated acute hemorrhage is identified. Vascular: No hyperdense vessel or unexpected calcification. Skull: Normal. Negative for fracture or focal lesion. Sinuses/Orbits: No acute finding. Other: None. IMPRESSION: Subacute anterior  right thalamic infarct, as described above. MRI correlation is recommended. Electronically Signed   By: Virgina Norfolk M.D.   On: 04/27/2021 21:47   MR ANGIO HEAD WO CONTRAST  Result Date: 04/28/2021 CLINICAL DATA:  Neuro deficit, acute, stroke suspected; Neuro deficit, acute, stroke suspected Left cranial nerve 6 palsy EXAM: MRI HEAD WITHOUT AND WITH CONTRAST MRA HEAD WITHOUT CONTRAST TECHNIQUE: Multiplanar, multiecho pulse sequences of the brain and surrounding structures were obtained without and with intravenous contrast. Angiographic images of the head were obtained using MRA technique without contrast. CONTRAST:  72mL GADAVIST GADOBUTROL 1 MMOL/ML IV SOLN COMPARISON:  None. FINDINGS: MRI HEAD FINDINGS Brain: Subacute infarct of the ventral and medial right thalamus. There is small amount of petechial hemorrhage without mass effect. No chronic microhemorrhage. There is multifocal hyperintense T2-weighted signal within the white matter. Generalized volume loss without a clear lobar predilection. Old right cerebellar infarct. There is no abnormal contrast enhancement. Vascular: Major flow voids are preserved. Skull and upper cervical spine: Normal calvarium and skull base. Visualized upper cervical spine and soft tissues are normal. Sinuses/Orbits:No paranasal sinus fluid levels or advanced mucosal thickening. No mastoid or middle ear effusion. Normal orbits. MRA HEAD FINDINGS POSTERIOR CIRCULATION: --Vertebral arteries: Normal --Inferior cerebellar arteries: Normal. --Basilar artery: Normal. --Superior cerebellar arteries: Normal. --Posterior cerebral arteries: Normal. The right PCA is partially supplied by a posterior communicating artery (p-comm). ANTERIOR CIRCULATION: --Intracranial internal carotid arteries: Normal. --Anterior cerebral arteries (ACA): Normal. --Middle cerebral arteries (MCA): Normal. ANATOMIC VARIANTS: None IMPRESSION: 1. Subacute infarct of the ventral and medial right thalamus with  small amount of petechial hemorrhage without mass effect, Heidelberg classification 1b: HI2, confluent petechiae, no mass effect. 2. Normal intracranial MRA. Electronically Signed   By: Ulyses Jarred M.D.   On: 04/28/2021 01:18   MR Brain W and Wo Contrast  Result Date: 04/28/2021 CLINICAL DATA:  Neuro deficit, acute, stroke suspected; Neuro deficit, acute, stroke suspected Left cranial nerve 6 palsy EXAM: MRI HEAD WITHOUT AND WITH CONTRAST MRA HEAD WITHOUT CONTRAST TECHNIQUE: Multiplanar, multiecho pulse sequences of the brain and surrounding structures were obtained without and with intravenous contrast. Angiographic images of the head were obtained using MRA technique without contrast. CONTRAST:  58mL GADAVIST GADOBUTROL 1 MMOL/ML IV SOLN COMPARISON:  None. FINDINGS: MRI HEAD FINDINGS Brain: Subacute infarct of the ventral and medial right thalamus. There is small amount of petechial hemorrhage without mass effect. No chronic microhemorrhage. There is multifocal hyperintense T2-weighted signal within the white matter. Generalized volume loss without a clear lobar predilection. Old right cerebellar infarct. There is no abnormal contrast enhancement. Vascular: Major flow voids are preserved. Skull and upper cervical spine: Normal calvarium and skull base. Visualized upper cervical spine and soft tissues are normal. Sinuses/Orbits:No paranasal sinus fluid levels or advanced mucosal thickening.  No mastoid or middle ear effusion. Normal orbits. MRA HEAD FINDINGS POSTERIOR CIRCULATION: --Vertebral arteries: Normal --Inferior cerebellar arteries: Normal. --Basilar artery: Normal. --Superior cerebellar arteries: Normal. --Posterior cerebral arteries: Normal. The right PCA is partially supplied by a posterior communicating artery (p-comm). ANTERIOR CIRCULATION: --Intracranial internal carotid arteries: Normal. --Anterior cerebral arteries (ACA): Normal. --Middle cerebral arteries (MCA): Normal. ANATOMIC VARIANTS: None  IMPRESSION: 1. 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 2. Normal intracranial MRA. Electronically Signed   By: Ulyses Jarred M.D.   On: 04/28/2021 01:18   ECHOCARDIOGRAM COMPLETE  Result Date: 04/28/2021    ECHOCARDIOGRAM REPORT   Patient Name:   Jahdiel Krol. Date of Exam: 04/28/2021 Medical Rec #:  [id number]             Height:       72.0 in Accession #:    [id number]            Weight:       211.0 lb Date of Birth:  11/02/1946              BSA:          2.180 m Patient Age:    75 years              BP:           148/87 mmHg Patient Gender: M                     HR:           105 bpm. Exam Location:  Inpatient Procedure: 2D Echo Indications:    Stroke I63.9  History:        Patient has prior history of Echocardiogram examinations, most                 recent 12/07/2018. Arrythmias:Atrial Fibrillation; Risk                 Factors:Hypertension.  Sonographer:    Mikki Santee RDCS (AE) Referring Phys: [id number] [place]  1. Atrial fibrillation present. Left ventricular ejection fraction, by estimation, is 45 to 50%. The left ventricle has mildly decreased function. The left ventricle demonstrates regional wall motion abnormalities (see scoring diagram/findings for description). Left ventricular diastolic parameters are indeterminate.  2. Right ventricular systolic function is normal. The right ventricular size is mildly enlarged. There is normal pulmonary artery systolic pressure.  3. Left atrial size was severely dilated.  4. Right atrial size was severely dilated.  5. The mitral valve is normal in structure. No evidence of mitral valve regurgitation. No evidence of mitral stenosis.  6. The aortic valve is tricuspid. Aortic valve regurgitation is mild. Mild to moderate aortic valve sclerosis/calcification is present, without any evidence  of aortic stenosis.  7. There is borderline dilatation of the ascending aorta, measuring 39 mm.  8. The inferior vena cava is normal in size with greater than 50% respiratory variability, suggesting right atrial pressure of 3 mmHg. FINDINGS  Left Ventricle: Atrial fibrillation present. Left ventricular ejection fraction, by estimation, is 45 to 50%. The left ventricle has mildly decreased function. The left ventricle demonstrates regional wall motion abnormalities. The left ventricular internal cavity size was normal in size. There is no left ventricular hypertrophy. Left ventricular diastolic parameters are indeterminate.  LV Wall Scoring: The basal inferior segment is akinetic. The  mid inferoseptal segment, mid inferior segment, and basal inferoseptal segment are hypokinetic. Right Ventricle: The right ventricular size is mildly enlarged. No increase in right ventricular wall thickness. Right ventricular systolic function is normal. There is normal pulmonary artery systolic pressure. The tricuspid regurgitant velocity is 2.14  m/s, and with an assumed right atrial pressure of 3 mmHg, the estimated right ventricular systolic pressure is [id number] mmHg. Left Atrium: Left atrial size was severely dilated. Right Atrium: Right atrial size was severely dilated. Pericardium: There is no evidence of pericardial effusion. Mitral Valve: The mitral valve is normal in structure. No evidence of mitral valve regurgitation. No evidence of mitral valve stenosis. Tricuspid Valve: The tricuspid valve is normal in structure. Tricuspid valve regurgitation is not demonstrated. No evidence of tricuspid stenosis. Aortic Valve: The aortic valve is tricuspid. Aortic valve regurgitation is mild. Aortic regurgitation PHT measures 714 msec. Mild to moderate aortic valve sclerosis/calcification is present, without any evidence of aortic stenosis. Pulmonic Valve: The pulmonic valve was normal in structure. Pulmonic valve regurgitation is not  visualized. No evidence of pulmonic stenosis. Aorta: The aortic root is normal in size and structure. There is borderline dilatation of the ascending aorta, measuring 39 mm. Venous: The inferior vena cava is normal in size with greater than 50% respiratory variability, suggesting right atrial pressure of 3 mmHg. IAS/Shunts: No atrial level shunt detected by color flow Doppler.  LEFT VENTRICLE PLAX 2D LVIDd:         4.70 cm LVIDs:         3.30 cm LV PW:         1.50 cm LV IVS:        1.30 cm LVOT diam:     2.30 cm LV SV:         33 LV SV Index:   15 LVOT Area:     4.15 cm  RIGHT VENTRICLE RV S prime:     8.25 cm/s LEFT ATRIUM            Index       RIGHT ATRIUM           Index LA diam:      5.90 cm  2.71 cm/m  RA Area:     26.40 cm LA Vol (A2C): 100.0 ml 45.88 ml/m RA Volume:   92.70 ml  42.53 ml/m LA Vol (A4C): 127.0 ml 58.27 ml/m  AORTIC VALVE LVOT Vmax:   69.20 cm/s LVOT Vmean:  49.500 cm/s LVOT VTI:    0.081 m AI PHT:      714 msec  AORTA Ao Root diam: 3.30 cm MITRAL VALVE               TRICUSPID VALVE MV Area (PHT): 4.86 cm    TR Peak grad:   18.3 mmHg MV Decel Time: 156 msec    TR Vmax:        214.00 cm/s MV E velocity: 74.60 cm/s MV A velocity: 29.60 cm/s  SHUNTS MV E/A ratio:  2.52        Systemic VTI:  0.08 m                            Systemic Diam: 2.30 cm Candee Furbish MD Electronically signed by Candee Furbish MD Signature Date/Time: 04/28/2021/11:04:13 AM    Final    CT ANGIO HEAD CODE STROKE  Result Date: 04/28/2021 CLINICAL DATA:  Dizziness and hallucinations EXAM: CT ANGIOGRAPHY HEAD AND  NECK TECHNIQUE: Multidetector CT imaging of the head and neck was performed using the standard protocol during bolus administration of intravenous contrast. Multiplanar CT image reconstructions and MIPs were obtained to evaluate the vascular anatomy. Carotid stenosis measurements (when applicable) are obtained utilizing NASCET criteria, using the distal internal carotid diameter as the denominator. CONTRAST:  11mL  OMNIPAQUE IOHEXOL 350 MG/ML SOLN COMPARISON:  MRA brain 04/28/2021 FINDINGS: CTA NECK FINDINGS SKELETON: There is no bony spinal canal stenosis. No lytic or blastic lesion. OTHER NECK: Normal pharynx, larynx and major salivary glands. No cervical lymphadenopathy. Unremarkable thyroid gland. UPPER CHEST: No pneumothorax or pleural effusion. No nodules or masses. AORTIC ARCH: There is calcific atherosclerosis of the aortic arch. There is no aneurysm, dissection or hemodynamically significant stenosis of the visualized portion of the aorta. Conventional 3 vessel aortic branching pattern. The visualized proximal subclavian arteries are widely patent. CAROTID SYSTEMS: There are symmetric, curvilinear enhancement defects within both proximal internal carotid arteries. There is no stenosis of either carotid system. VERTEBRAL ARTERIES: Codominant configuration. Both origins are clearly patent. There is no dissection, occlusion or flow-limiting stenosis to the skull base (V1-V3 segments). CTA HEAD FINDINGS POSTERIOR CIRCULATION: --Vertebral arteries: Normal V4 segments. --Inferior cerebellar arteries: Normal. --Basilar artery: Normal. --Superior cerebellar arteries: Normal. --Posterior cerebral arteries (PCA): Normal. The right PCA is partially supplied by a posterior communicating artery (p-comm). ANTERIOR CIRCULATION: --Intracranial internal carotid arteries: Moderate narrowing of the right ICA at the skull base. Mild bilateral atherosclerotic calcification. --Anterior cerebral arteries (ACA): Normal. Both A1 segments are present. Patent anterior communicating artery (a-comm). --Middle cerebral arteries (MCA): Normal. VENOUS SINUSES: As permitted by contrast timing, patent. ANATOMIC VARIANTS: None Review of the MIP images confirms the above findings. IMPRESSION: 1. No intracranial arterial occlusion or high-grade stenosis. 2. Symmetric, curvilinear enhancement defects within both proximal internal carotid arteries, favored  to be artifacts related to flow turbulence. Carotid web is a secondary consideration, but the appearance is not typical. Dissection or thromboembolism are unlikely given the symmetry. 3. 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 Aortic Atherosclerosis (ICD10-I70.0). Electronically Signed   By: Ulyses Jarred M.D.   On: 04/28/2021 02:30   CT ANGIO NECK CODE STROKE  Result Date: 04/28/2021 CLINICAL DATA:  Dizziness and hallucinations EXAM: CT ANGIOGRAPHY HEAD AND NECK TECHNIQUE: Multidetector CT imaging of the head and neck was performed using the standard protocol during bolus administration of intravenous contrast. Multiplanar CT image reconstructions and MIPs were obtained to evaluate the vascular anatomy. Carotid stenosis measurements (when applicable) are obtained utilizing NASCET criteria, using the distal internal carotid diameter as the denominator. CONTRAST:  51mL OMNIPAQUE IOHEXOL 350 MG/ML SOLN COMPARISON:  MRA brain 04/28/2021 FINDINGS: CTA NECK FINDINGS SKELETON: There is no bony spinal canal stenosis. No lytic or blastic lesion. OTHER NECK: Normal pharynx, larynx and major salivary glands. No cervical lymphadenopathy. Unremarkable thyroid gland. UPPER CHEST: No pneumothorax or pleural effusion. No nodules or masses. AORTIC ARCH: There is calcific atherosclerosis of the aortic arch. There is no aneurysm, dissection or hemodynamically significant stenosis of the visualized portion of the aorta. Conventional 3 vessel aortic branching pattern. The visualized proximal subclavian arteries are widely patent. CAROTID SYSTEMS: There are symmetric, curvilinear enhancement defects within both proximal internal carotid arteries. There is no stenosis of either carotid system. VERTEBRAL ARTERIES: Codominant configuration. Both origins are clearly patent. There is no dissection, occlusion or flow-limiting stenosis to the skull base (V1-V3 segments). CTA HEAD FINDINGS POSTERIOR  CIRCULATION: --Vertebral arteries: Normal V4  segments. --Inferior cerebellar arteries: Normal. --Basilar artery: Normal. --Superior cerebellar arteries: Normal. --Posterior cerebral arteries (PCA): Normal. The right PCA is partially supplied by a posterior communicating artery (p-comm). ANTERIOR CIRCULATION: --Intracranial internal carotid arteries: Moderate narrowing of the right ICA at the skull base. Mild bilateral atherosclerotic calcification. --Anterior cerebral arteries (ACA): Normal. Both A1 segments are present. Patent anterior communicating artery (a-comm). --Middle cerebral arteries (MCA): Normal. VENOUS SINUSES: As permitted by contrast timing, patent. ANATOMIC VARIANTS: None Review of the MIP images confirms the above findings. IMPRESSION: 1. No intracranial arterial occlusion or high-grade stenosis. 2. Symmetric, curvilinear enhancement defects within both proximal internal carotid arteries, favored to be artifacts related to flow turbulence. Carotid web is a secondary consideration, but the appearance is not typical. Dissection or thromboembolism are unlikely given the symmetry. 3. 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 Aortic Atherosclerosis (ICD10-I70.0). Electronically Signed   By: Ulyses Jarred M.D.   On: 04/28/2021 02:30    PHYSICAL EXAM  Temp:  [97.7 F (36.5 C)-98.1 F (36.7 C)] 98 F (36.7 C) (05/03 1200) Pulse Rate:  [31-114] 85 (05/03 0522) Resp:  [9-23] 19 (05/03 1200) BP: (119-173)/(88-133) 119/94 (05/03 1200) SpO2:  [98 %-100 %] 98 % (05/03 1200) Weight:  [95.7 kg] 95.7 kg (05/03 0552)  General - Well nourished, well developed, in no apparent distress.  Ophthalmologic - fundi not visualized due to noncooperation.  Cardiovascular - irregularly irregular heart rate and rhythm.  Mental Status -  Level of arousal and orientation to time, place, and person were intact. He knows that today is his birthday.  Language including  expression, naming, repetition, comprehension was assessed and found intact. Fund of Knowledge was assessed and was intact.  Cranial Nerves II - XII - II - Visual field intact OU. III, IV, VI - Extraocular movements intact. V - Facial sensation intact bilaterally. VII - slight left nasolabial fold flattening.  VIII - Hearing & vestibular intact bilaterally. X - Palate elevates symmetrically. XI - Chin turning & shoulder shrug intact bilaterally. XII - Tongue protrusion intact.  Motor Strength - The patient's strength was symmetrical in all extremities and pronator drift was absent.  Bulk was normal and fasciculations were absent.   Motor Tone - Muscle tone was assessed at the neck and appendages and was normal.  Reflexes - The patient's reflexes were symmetrical in all extremities and he had no pathological reflexes.  Sensory - Light touch, temperature/pinprick were assessed and were symmetrical.    Coordination - The patient had normal movements in the hands with no ataxia or dysmetria.  Tremor was absent.  Gait and Station - deferred.   ASSESSMENT/PLAN Mr. Nicolaas Savo. is a 75 y.o. male with history of afib on coumadin, DVT, hepC, HTN, prostate Ca, substance abuse admitted for generalized weakness, gait difficulty, confusion and visual difficulty. No tPA given due to outside window.    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 Embolic due to subtherapeutic INR  CT right thalamic infarct (acute imaging appearance)   MRI  Right thalamic infarct with small amount of petechial hemorrhage.  MRA  Normal   CTA head and neck right ICA siphon athero. Bilateral ICA bulb curvilinear enhancement defects favored to be artifacts  Carotid Doppler  Unremarkable but low flow velocity throughout  2D Echo  EF 45-50%, LV wall The basal inferior segment is akinetic. The mid inferoseptal segment, mid inferior segment, and basal inferoseptal segment  are  hypokinetic  LDL 103  HgbA1c 5.6  INR 1.5  UDS neg  Heparin subq for VTE prophylaxis  warfarin daily prior to admission, now on aspirin 81 mg daily given HT. Discussed with pt and he is in agreement with DOAC. Will recommend to repeat CT head in 5-7 days and start eliquis if CT and clinical stable. Once eliquis stated, ASA can be discontinued.   Patient counseled to be compliant with his antithrombotic medications  Ongoing aggressive stroke risk factor management  Therapy recommendations:  CIR  Disposition:  Pending   B/l carotid bulb abnormality on CT  CTA head and neck - Symmetric, curvilinear enhancement defects within both proximal internal carotid arteries, favored to be artifacts related to flow turbulence  CUS showed near normal of ICA bulb, however low flow velocity throughout  Taken together, the abnormality seen on CT most likely due to low flow in the setting of cardiomyopathy and persistent afib   No cerebral angiogram needed at this time.  Chronic afib  EKG and tele showed persistent afib  On coumadin PTA  INR 1.5 on presentation, and seems difficult INR management at home  Now on ASA given HT, but pt agrees with DOAC going forward.  Hypertension . Stable  Long term BP goal normotensive  Hyperlipidemia  Home meds:  None   LDL 103, goal < 70  Now on lipitor 40  Continue statin at discharge  Other Stroke Risk Factors  Advanced age  Hx of DVT  Hx of substance abuse  Other Active Problems  Hep C  Prostate cancer  Hospital day # 0  Neurology will sign off. Please call with questions. Pt will follow up with stroke clinic [facility] at [facility] in about 4 weeks. Thanks for the consult.   Rosalin Hawking, MD PhD Stroke Neurology 04/28/2021 1:55 PM    To contact Stroke Continuity provider, please refer to [email or web address] After hours, contact General Neurology

## 2021-04-28 NOTE — Progress Notes (Signed)
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 Past medical history of atrial fibrillation/atrial flutter on Coumadin, history of DVT, hepatitis C, gout, hypertension, prostate cancer, prior history of substance abuse, current intermittent marijuana use.  Precautions  Precautions Fall  Restrictions  Weight Bearing Restrictions No  Home Living  Family/patient expects to be discharged to: Private residence  Living Arrangements Alone  Available Help at Discharge Family;Available PRN/intermittently  Type of Home Apartment  Home Access Level entry  Home Layout One level  Bathroom Shower/Tub Tub/shower unit;Curtain  Corporate treasurer Yes  Home Equipment Crutches  Prior Function  Level of Independence Independent with assistive device(s)  Comments uses a crutch when he has a gout attack. drives; pays his own bills; has intentions of working out  Communication  Communication No difficulties  Pain Assessment  Pain Assessment No/denies pain  Cognition  Arousal/Alertness Awake/alert  Behavior During Therapy WFL for tasks assessed/performed  Overall Cognitive Status Impaired/Different from baseline  Area of Impairment Attention;Safety/judgement;Awareness;Problem solving  Current Attention Level Selective  Safety/Judgement Decreased awareness of safety;Decreased awareness of deficits  Awareness Emergent  Problem Solving Slow processing;Requires verbal cues;Requires tactile cues  General Comments leaning L - unaware of abnormal positioning of trunk  Upper Extremity Assessment  Upper Extremity Assessment Defer to OT evaluation  Lower Extremity Assessment  Lower Extremity Assessment LLE deficits/detail   LLE Deficits / Details Decreased coordination noted during heel to shin test and difficulty taking steps. Functional weakness noted.  Cervical / Trunk Assessment  Cervical / Trunk Assessment Other exceptions (L bias)  Bed Mobility  Overal bed mobility Needs Assistance  Bed Mobility Sit to Supine  Sit to supine Min assist  General bed mobility comments Min A for LE assist for return to supine.  Transfers  Overall transfer level Needs assistance  Equipment used 1 person hand held assist;2 person hand held assist  Transfers Sit to/from Stand  Sit to Stand Mod assist;+2 safety/equipment  General transfer comment Mod A for lift assist and steadying to stand. tended to have L lateral lean and required cues for upright posture. Mod A +2 to take side steps at EOB. Cues for sequencing and L lateral lean.  Balance  Overall balance assessment Needs assistance  Sitting-balance support Bilateral upper extremity supported;Feet supported  Sitting balance-Leahy Scale Poor  Sitting balance - Comments L lateral lean and required BUE and external support.  Postural control Left lateral lean  Standing balance support Bilateral upper extremity supported  Standing balance-Leahy Scale Poor  Standing balance comment L lateral lean noted. Required external support to maintain balance.  General Comments  General comments (skin integrity, edema, etc.) Double vision reported. See OT note for further details  PT - End of Session  Equipment Utilized During Treatment Gait belt  Activity Tolerance Patient tolerated treatment well  Patient left in bed;with call bell/phone within reach (on stretcher in ED)  Nurse Communication Mobility status  PT Assessment  PT Recommendation/Assessment Patient needs continued PT services  PT Visit Diagnosis Unsteadiness on feet (R26.81);Muscle weakness (generalized) (M62.81);Other symptoms and signs involving the nervous system (R29.898)  PT Problem List Decreased  strength;Decreased balance;Decreased mobility;Decreased cognition;Decreased knowledge of use of DME;Decreased safety awareness;Decreased knowledge of precautions  PT Plan  PT  Frequency (ACUTE ONLY) Min 4X/week  PT Treatment/Interventions (ACUTE ONLY) DME instruction;Gait training;Functional mobility training;Therapeutic activities;Therapeutic exercise;Balance training;Patient/family education  AM-PAC PT "6 Clicks" Mobility Outcome Measure (Version 2)  Help needed turning from your back to your side while in a flat bed without using bedrails? 3  Help needed moving from lying on your back to sitting on the side of a flat bed without using bedrails? 3  Help needed moving to and from a bed to a chair (including a wheelchair)? 2  Help needed standing up from a chair using your arms (e.g., wheelchair or bedside chair)? 2  Help needed to walk in hospital room? 1  Help needed climbing 3-5 steps with a railing?  1  6 Click Score 12  Consider Recommendation of Discharge To: CIR/SNF/LTACH  PT Recommendation  Recommendations for Other Services Rehab consult  Follow Up Recommendations CIR  PT equipment Other (comment) (TBD pending mobility progression)  Individuals Consulted  Consulted and Agree with Results and Recommendations Patient  Acute Rehab PT Goals  Patient Stated Goal to recover and be independent again  PT Goal Formulation With patient  Time For Goal Achievement 05/12/21  Potential to Achieve Goals Good  PT Time Calculation  PT Start Time (ACUTE ONLY) 0940  PT Stop Time (ACUTE ONLY) 0959  PT Time Calculation (min) (ACUTE ONLY) 19 min  PT General Charges  $$ ACUTE PT VISIT 1 Visit  PT Evaluation  $PT Eval Moderate Complexity 1 Mod  Written Expression  Dominant Hand Left   Pt admitted secondary to problem above with deficits below. Pt requiring min A for bed mobility and mod A +2 for gait and side stepping at EOB in ED. Pt presenting with L lateral lean in sitting and standing and  decreased coordination noted in LLE. Pt was previously independent. Recommending CIR level therapies to increase independence and safety with mobility. Will continue to follow acutely.   Reuel Derby, PT, DPT  Acute Rehabilitation Services  Pager: [phone] Office: [phone]

## 2021-04-28 NOTE — Progress Notes (Signed)
  Echocardiogram 2D Echocardiogram has been performed.  Jennette Dubin 04/28/2021, 9:08 AM

## 2021-04-28 NOTE — ED Notes (Signed)
Echo tech at bedside.

## 2021-04-28 NOTE — Progress Notes (Signed)
Patient arrived to room 5C20 in NAD, VS stable and patient free from pain. Patient oriented to room and call bell in reach.

## 2021-04-28 NOTE — ED Notes (Signed)
Pt taken to MRI  

## 2021-04-28 NOTE — ED Notes (Signed)
Dr. Posey Pronto paged of Critical troponin level and notified that pt passed swallow screen

## 2021-04-28 NOTE — Progress Notes (Addendum)
PROGRESS NOTE    Jordan [name].  FB:[id number] DOB: 1946-07-30 DOA: 04/27/2021 PCP: [name], MD   Brief Narrative:  HPI on 04/27/2021 by Dr. Zada Finders Jordan [name]. is a 75 y.o. male with medical history significant for persistent atrial fibrillation on Coumadin, NICM, history of DVT, hypertension, CKD stage III, gout, and prostate cancer s/p radical prostatectomy who presents to the ED for evaluation of gait abnormality.  Patient states on 04/25/2021 he had a couple glasses of wine.  Afterwards he started feeling somewhat confused with new double vision.  He felt as if he had been drinking more than he actually had.  The following day he felt like he was hallucinating thinking there were other acquaintances in his house when they were not.  He has been having difficulty walking with feeling off balance and lightheaded.  He felt weak in his legs.  He did not have any room spinning sensation.  He denied any new numbness or tingling.  He has not had any weakness in his arms.  He otherwise denies any headache, nausea, vomiting, chest pain, palpitations, cough, abdominal pain, dysuria, diarrhea.  He does note some dyspnea with exertion.  He denies any obvious bleeding.  Interim history Patient presented with gait imbalance and blurry vision, with hallucinations that started days prior to admission.  MRI showing subacute CVA.  Neurology consulted and appreciated.  Patient also with persistent atrial fibrillation with subtherapeutic INR.  Pending CVA work-up as well as neurology recommendations. Assessment & Plan   Acute CVA -Patient complains of gait and balance, blurry vision and even hallucinations which started days prior to admission -CT head showed subacute anterior right thalamic infarct -MRI brain showed subacute infarct of the ventral and right medial thalamus with small amount of petechial hemorrhage without mass-effect. (Suspect MRI is reading subacute given  hemorrhagic conversion, this is likely acute.) -MRA brain normal -Echocardiogram pending -Toxicology screen unremarkable -LDL 103, hemoglobin A1c 5.6 -PT/OT recommending CIR -Speech eval pending  -Neurology consulted and appreciated -Continue aspirin, statin. Coumadin currently held due to hemorrhagic conversion.  Elevated troponin -High-sensitivity troponin 139 --> 125 --> 140, not consistent with ACS -Echocardiogram pending -Suspect secondary to the above  Elevated BNP -BNP 446.7 -Patient does not appear to be volume overloaded- has chronic left lower extremity edema -No history of CHF, although EF in 2018 was noted to be 45 to 50% however EF improved to 60 to 65% in 2019 -Echocardiogram pending  Persistent atrial fibrillation with subtherapeutic INR -Patient has been on Coumadin however INR has been inconsistent -May need different type of anticoagulation, will follow-up with neurology -INR currently 1.5, Coumadin held given hemorrhagic conversion noted on MRI -Continue Coreg  Essential hypertension -Continue Coreg -Losartan currently held  Chronic kidney disease, stage III, unspecified whether A or B -GFR has ranged within stage IIIa-IIIb since 2019 -Creatinine appears to be stable, continue to monitor  Polycythemia -Possibly due to dehydration -Continue IV fluids and monitor CBC   DVT Prophylaxis Heparin  Code Status: Full  Family Communication: None at bedside  Disposition Plan:  Status is: Observation  The patient will require care spanning > 2 midnights and should be moved to inpatient because: Inpatient level of care appropriate due to severity of illness  Dispo: The patient is from: Home              Anticipated d/c is to: CIR              Patient currently  is not medically stable to d/c.   Difficult to place patient No   Consultants Neurology  Procedures  Echocardiogram  Antibiotics   Anti-infectives (From admission, onward)   None       Subjective:   Jordan [name] seen and examined today.  Patient with no complaints of pain, dizziness or headache at this time.  Denies current chest pain or shortness of breath, abdominal pain, nausea or vomiting, diarrhea constipation, dizziness or headache. Objective:   Vitals:   04/28/21 0515 04/28/21 0522 04/28/21 0530 04/28/21 0552  BP: (!) 139/108 (!) 139/108 (!) 148/97   Pulse:  85    Resp: 12 (!) 9 (!) 9   Temp:      TempSrc:      SpO2:  100%    Weight:    95.7 kg  Height:    6' (1.829 m)    Intake/Output Summary (Last 24 hours) at 04/28/2021 1015 Last data filed at 04/28/2021 [id number] Gross per 24 hour  Intake 0 ml  Output 400 ml  Net -400 ml   Filed Weights   04/28/21 0552  Weight: 95.7 kg    Exam  General: Well developed, well nourished, NAD, appears stated age  HEENT: NCAT, PERRLA, EOMI, Anicteic Sclera, mucous membranes moist.   Neck: Supple  Cardiovascular: S1 S2 auscultated, irregular, no murmur appreciated  Respiratory: Clear to auscultation bilaterally with equal chest rise  Abdomen: Soft, nontender, nondistended, + bowel sounds  Extremities: warm dry without cyanosis clubbing. LLE larger than RLE  Neuro: AAOx3, cranial nerves grossly intact. Strength 5/5 in patient's upper and lower extremities bilaterally  Skin: Without rashes exudates or nodules  Psych: Normal affect and demeanor with intact judgement and insight   Data Reviewed: I have personally reviewed following labs and imaging studies  CBC: Recent Labs  Lab 04/27/21 2008 04/28/21 0446  WBC 8.6 9.3  NEUTROABS 5.8  --   HGB 19.2* 16.6  HCT 55.7* 49.2  MCV 88.6 89.6  PLT 205 [id number]   Basic Metabolic Panel: Recent Labs  Lab 04/27/21 2008 04/27/21 2213 04/28/21 0446  NA 138  --  138  K 3.6  --  3.6  CL 105  --  107  CO2 22  --  22  GLUCOSE 105*  --  119*  BUN 28*  --  25*  CREATININE 1.69*  --  1.69*  CALCIUM 9.4  --  8.7*  MG  --  2.0  --    GFR: Estimated Creatinine  Clearance: 46 mL/min (A) (by C-G formula based on SCr of 1.69 mg/dL (H)). Liver Function Tests: Recent Labs  Lab 04/27/21 2008 04/28/21 0446  AST 63* 40  ALT 36 28  ALKPHOS 77 58  BILITOT 2.0* 1.4*  PROT 7.7 6.2*  ALBUMIN 3.0* 2.5*   No results for input(s): LIPASE, AMYLASE in the last 168 hours. No results for input(s): AMMONIA in the last 168 hours. Coagulation Profile: Recent Labs  Lab 04/27/21 2213 04/28/21 0446  INR 1.3* 1.5*   Cardiac Enzymes: No results for input(s): CKTOTAL, CKMB, CKMBINDEX, TROPONINI in the last 168 hours. BNP (last 3 results) No results for input(s): PROBNP in the last 8760 hours. HbA1C: Recent Labs    04/28/21 0447  HGBA1C 5.6   CBG: No results for input(s): GLUCAP in the last 168 hours. Lipid Profile: Recent Labs    04/28/21 0446  CHOL 150  HDL 37*  LDLCALC 103*  TRIG 51  CHOLHDL 4.1   Thyroid Function Tests: Recent  Labs    04/27/21 2213  TSH 2.994   Anemia Panel: No results for input(s): VITAMINB12, FOLATE, FERRITIN, TIBC, IRON, RETICCTPCT in the last 72 hours. Urine analysis:    Component Value Date/Time   COLORURINE AMBER (A) 04/27/2021 2311   APPEARANCEUR HAZY (A) 04/27/2021 2311   LABSPEC 1.019 04/27/2021 2311   PHURINE 5.0 04/27/2021 2311   GLUCOSEU NEGATIVE 04/27/2021 2311   HGBUR SMALL (A) 04/27/2021 2311   BILIRUBINUR NEGATIVE 04/27/2021 2311   BILIRUBINUR n 01/06/2015 0917   KETONESUR NEGATIVE 04/27/2021 2311   PROTEINUR >=300 (A) 04/27/2021 2311   UROBILINOGEN negative 01/06/2015 0917   NITRITE NEGATIVE 04/27/2021 2311   LEUKOCYTESUR NEGATIVE 04/27/2021 2311   Sepsis Labs: @LABRCNTIP (procalcitonin:4,lacticidven:4)  )No results found for this or any previous visit (from the past 240 hour(s)).    Radiology Studies: DG Chest 2 View  Result Date: 04/27/2021 CLINICAL DATA:  Weak and dizzy EXAM: CHEST - 2 VIEW COMPARISON:  10/20/2018 FINDINGS: Mild cardiomegaly. No focal opacity or pleural effusion. Aortic  atherosclerosis. No pneumothorax. IMPRESSION: No active cardiopulmonary disease.  Borderline to mild cardiomegaly Electronically Signed   By: Donavan Foil M.D.   On: 04/27/2021 19:28   CT Head Wo Contrast  Result Date: 04/27/2021 CLINICAL DATA:  Dizziness and blurred vision. EXAM: CT HEAD WITHOUT CONTRAST TECHNIQUE: Contiguous axial images were obtained from the base of the skull through the vertex without intravenous contrast. COMPARISON:  October 20, 2018 FINDINGS: Brain: There is mild cerebral atrophy with widening of the extra-axial spaces and ventricular dilatation. There are areas of decreased attenuation within the white matter tracts of the supratentorial brain, consistent with microvascular disease changes. A 2.4 cm x 1.5 cm ill-defined area of low attenuation is seen within the anterior thalamic region on the right (axial CT image 16, CT series 4). This represents a new finding when compared to the prior exam. Very mild associated mass effect is seen with approximately 2 mm right to left midline shift (coronal reformatted image 40, CT series number 5). No associated acute hemorrhage is identified. Vascular: No hyperdense vessel or unexpected calcification. Skull: Normal. Negative for fracture or focal lesion. Sinuses/Orbits: No acute finding. Other: None. IMPRESSION: Subacute anterior right thalamic infarct, as described above. MRI correlation is recommended. Electronically Signed   By: Virgina Norfolk M.D.   On: 04/27/2021 21:47   MR ANGIO HEAD WO CONTRAST  Result Date: 04/28/2021 CLINICAL DATA:  Neuro deficit, acute, stroke suspected; Neuro deficit, acute, stroke suspected Left cranial nerve 6 palsy EXAM: MRI HEAD WITHOUT AND WITH CONTRAST MRA HEAD WITHOUT CONTRAST TECHNIQUE: Multiplanar, multiecho pulse sequences of the brain and surrounding structures were obtained without and with intravenous contrast. Angiographic images of the head were obtained using MRA technique without contrast.  CONTRAST:  1mL GADAVIST GADOBUTROL 1 MMOL/ML IV SOLN COMPARISON:  None. FINDINGS: MRI HEAD FINDINGS Brain: Subacute infarct of the ventral and medial right thalamus. There is small amount of petechial hemorrhage without mass effect. No chronic microhemorrhage. There is multifocal hyperintense T2-weighted signal within the white matter. Generalized volume loss without a clear lobar predilection. Old right cerebellar infarct. There is no abnormal contrast enhancement. Vascular: Major flow voids are preserved. Skull and upper cervical spine: Normal calvarium and skull base. Visualized upper cervical spine and soft tissues are normal. Sinuses/Orbits:No paranasal sinus fluid levels or advanced mucosal thickening. No mastoid or middle ear effusion. Normal orbits. MRA HEAD FINDINGS POSTERIOR CIRCULATION: --Vertebral arteries: Normal --Inferior cerebellar arteries: Normal. --Basilar artery: Normal. --Superior cerebellar arteries: Normal. --  Posterior cerebral arteries: Normal. The right PCA is partially supplied by a posterior communicating artery (p-comm). ANTERIOR CIRCULATION: --Intracranial internal carotid arteries: Normal. --Anterior cerebral arteries (ACA): Normal. --Middle cerebral arteries (MCA): Normal. ANATOMIC VARIANTS: None IMPRESSION: 1. 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 2. Normal intracranial MRA. Electronically Signed   By: Ulyses Jarred M.D.   On: 04/28/2021 01:18   MR Brain W and Wo Contrast  Result Date: 04/28/2021 CLINICAL DATA:  Neuro deficit, acute, stroke suspected; Neuro deficit, acute, stroke suspected Left cranial nerve 6 palsy EXAM: MRI HEAD WITHOUT AND WITH CONTRAST MRA HEAD WITHOUT CONTRAST TECHNIQUE: Multiplanar, multiecho pulse sequences of the brain and surrounding structures were obtained without and with intravenous contrast.  Angiographic images of the head were obtained using MRA technique without contrast. CONTRAST:  50mL GADAVIST GADOBUTROL 1 MMOL/ML IV SOLN COMPARISON:  None. FINDINGS: MRI HEAD FINDINGS Brain: Subacute infarct of the ventral and medial right thalamus. There is small amount of petechial hemorrhage without mass effect. No chronic microhemorrhage. There is multifocal hyperintense T2-weighted signal within the white matter. Generalized volume loss without a clear lobar predilection. Old right cerebellar infarct. There is no abnormal contrast enhancement. Vascular: Major flow voids are preserved. Skull and upper cervical spine: Normal calvarium and skull base. Visualized upper cervical spine and soft tissues are normal. Sinuses/Orbits:No paranasal sinus fluid levels or advanced mucosal thickening. No mastoid or middle ear effusion. Normal orbits. MRA HEAD FINDINGS POSTERIOR CIRCULATION: --Vertebral arteries: Normal --Inferior cerebellar arteries: Normal. --Basilar artery: Normal. --Superior cerebellar arteries: Normal. --Posterior cerebral arteries: Normal. The right PCA is partially supplied by a posterior communicating artery (p-comm). ANTERIOR CIRCULATION: --Intracranial internal carotid arteries: Normal. --Anterior cerebral arteries (ACA): Normal. --Middle cerebral arteries (MCA): Normal. ANATOMIC VARIANTS: None IMPRESSION: 1. 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 2. Normal intracranial MRA. Electronically Signed   By: Ulyses Jarred M.D.   On: 04/28/2021 01:18   CT ANGIO HEAD CODE STROKE  Result Date: 04/28/2021 CLINICAL DATA:  Dizziness and hallucinations EXAM: CT ANGIOGRAPHY HEAD AND NECK TECHNIQUE: Multidetector CT imaging of the head and neck was performed using the standard protocol during bolus administration of intravenous contrast. Multiplanar CT image  reconstructions and MIPs were obtained to evaluate the vascular anatomy. Carotid stenosis measurements (when applicable) are obtained utilizing NASCET criteria, using the distal internal carotid diameter as the denominator. CONTRAST:  54mL OMNIPAQUE IOHEXOL 350 MG/ML SOLN COMPARISON:  MRA brain 04/28/2021 FINDINGS: CTA NECK FINDINGS SKELETON: There is no bony spinal canal stenosis. No lytic or blastic lesion. OTHER NECK: Normal pharynx, larynx and major salivary glands. No cervical lymphadenopathy. Unremarkable thyroid gland. UPPER CHEST: No pneumothorax or pleural effusion. No nodules or masses. AORTIC ARCH: There is calcific atherosclerosis of the aortic arch. There is no aneurysm, dissection or hemodynamically significant stenosis of the visualized portion of the aorta. Conventional 3 vessel aortic branching pattern. The visualized proximal subclavian arteries are widely patent. CAROTID SYSTEMS: There are symmetric, curvilinear enhancement defects within both proximal internal carotid arteries. There is no stenosis of either carotid system. VERTEBRAL ARTERIES: Codominant configuration. Both origins are clearly patent. There is no dissection, occlusion or flow-limiting stenosis to the skull base (V1-V3 segments). CTA HEAD FINDINGS POSTERIOR CIRCULATION: --Vertebral arteries: Normal V4 segments. --Inferior cerebellar arteries: Normal. --Basilar artery:  Normal. --Superior cerebellar arteries: Normal. --Posterior cerebral arteries (PCA): Normal. The right PCA is partially supplied by a posterior communicating artery (p-comm). ANTERIOR CIRCULATION: --Intracranial internal carotid arteries: Moderate narrowing of the right ICA at the skull base. Mild bilateral atherosclerotic calcification. --Anterior cerebral arteries (ACA): Normal. Both A1 segments are present. Patent anterior communicating artery (a-comm). --Middle cerebral arteries (MCA): Normal. VENOUS SINUSES: As permitted by contrast timing, patent. ANATOMIC  VARIANTS: None Review of the MIP images confirms the above findings. IMPRESSION: 1. No intracranial arterial occlusion or high-grade stenosis. 2. Symmetric, curvilinear enhancement defects within both proximal internal carotid arteries, favored to be artifacts related to flow turbulence. Carotid web is a secondary consideration, but the appearance is not typical. Dissection or thromboembolism are unlikely given the symmetry. 3. 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 Aortic Atherosclerosis (ICD10-I70.0). Electronically Signed   By: Ulyses Jarred M.D.   On: 04/28/2021 02:30   CT ANGIO NECK CODE STROKE  Result Date: 04/28/2021 CLINICAL DATA:  Dizziness and hallucinations EXAM: CT ANGIOGRAPHY HEAD AND NECK TECHNIQUE: Multidetector CT imaging of the head and neck was performed using the standard protocol during bolus administration of intravenous contrast. Multiplanar CT image reconstructions and MIPs were obtained to evaluate the vascular anatomy. Carotid stenosis measurements (when applicable) are obtained utilizing NASCET criteria, using the distal internal carotid diameter as the denominator. CONTRAST:  68mL OMNIPAQUE IOHEXOL 350 MG/ML SOLN COMPARISON:  MRA brain 04/28/2021 FINDINGS: CTA NECK FINDINGS SKELETON: There is no bony spinal canal stenosis. No lytic or blastic lesion. OTHER NECK: Normal pharynx, larynx and major salivary glands. No cervical lymphadenopathy. Unremarkable thyroid gland. UPPER CHEST: No pneumothorax or pleural effusion. No nodules or masses. AORTIC ARCH: There is calcific atherosclerosis of the aortic arch. There is no aneurysm, dissection or hemodynamically significant stenosis of the visualized portion of the aorta. Conventional 3 vessel aortic branching pattern. The visualized proximal subclavian arteries are widely patent. CAROTID SYSTEMS: There are symmetric, curvilinear enhancement defects within both proximal internal carotid arteries. There is no  stenosis of either carotid system. VERTEBRAL ARTERIES: Codominant configuration. Both origins are clearly patent. There is no dissection, occlusion or flow-limiting stenosis to the skull base (V1-V3 segments). CTA HEAD FINDINGS POSTERIOR CIRCULATION: --Vertebral arteries: Normal V4 segments. --Inferior cerebellar arteries: Normal. --Basilar artery: Normal. --Superior cerebellar arteries: Normal. --Posterior cerebral arteries (PCA): Normal. The right PCA is partially supplied by a posterior communicating artery (p-comm). ANTERIOR CIRCULATION: --Intracranial internal carotid arteries: Moderate narrowing of the right ICA at the skull base. Mild bilateral atherosclerotic calcification. --Anterior cerebral arteries (ACA): Normal. Both A1 segments are present. Patent anterior communicating artery (a-comm). --Middle cerebral arteries (MCA): Normal. VENOUS SINUSES: As permitted by contrast timing, patent. ANATOMIC VARIANTS: None Review of the MIP images confirms the above findings. IMPRESSION: 1. No intracranial arterial occlusion or high-grade stenosis. 2. Symmetric, curvilinear enhancement defects within both proximal internal carotid arteries, favored to be artifacts related to flow turbulence. Carotid web is a secondary consideration, but the appearance is not typical. Dissection or thromboembolism are unlikely given the symmetry. 3. 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 Aortic Atherosclerosis (ICD10-I70.0). Electronically Signed   By: Ulyses Jarred M.D.   On: 04/28/2021 02:30     Scheduled Meds: . allopurinol  100 mg Oral Daily  . aspirin EC  81 mg Oral Daily  . atorvastatin  80 mg Oral Daily  . carvedilol  25 mg Oral BID WC  . heparin  5,000 Units  Subcutaneous Q8H   Continuous Infusions: . sodium chloride 100 mL/hr at 04/28/21 0337     LOS: 0 days   Time Spent in minutes   45 minutes  Decklan Mau D.O. on 04/28/2021 at 10:15 AM  Between 7am to 7pm - Please see  pager noted on [email or web address]  After 7pm go to [email or web address]  And look for the night coverage person covering for me after hours  [facility] Office  [phone]

## 2021-04-28 NOTE — Progress Notes (Signed)
Occupational Therapy Evaluation Patient Details Name: Jordan [name]. 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 Pt presents with a significant functional decline, requiring Mod A with mobility and Max A with LB ADL due to deficits listed below. Pt continues to demonstrate diplopia in distant vision. Will plan to use partial occlusion to help pt improve his functional vision in order to increase safety with ADL and mobility. Increased SOB with minimal activity: HR 96-139; BP 141/111; SpO2 100 RA. Pt states he has had increased SOB over the last 2 weeks. Recommend rehab at [facility]. Pt very motivated to return to being independent again. Will follow acutely.     Follow Up Recommendations  CIR    Equipment Recommendations  3 in 1 bedside commode    Recommendations for Other Services Rehab consult     Precautions / Restrictions Precautions Precautions: Fall Restrictions Weight Bearing Restrictions: No      Mobility Bed Mobility Overal bed mobility: Needs Assistance Bed Mobility: Supine to Sit;Sit to Supine     Supine to sit: Min assist Sit to supine: Min assist        Transfers Overall transfer level: Needs assistance Equipment used: 1 person hand held assist;2 person hand held assist Transfers: Sit to/from Stand Sit to Stand: Mod assist;+2 safety/equipment         General transfer comment: stepping to L with +2 A; would do  better with RW    Balance Overall balance assessment: Needs assistance Sitting-balance support: Bilateral upper extremity supported;Feet supported Sitting balance-Leahy Scale: Poor   Postural control: Left lateral lean Standing balance support: Bilateral upper extremity supported Standing balance-Leahy Scale: Poor Standing balance comment: L bias                           ADL either performed or assessed with clinical judgement   ADL Overall ADL's : Needs assistance/impaired Eating/Feeding: Modified independent   Grooming: Set up;Supervision/safety;Sitting   Upper Body Bathing: Set up;Supervision/ safety;Sitting   Lower Body Bathing: Moderate assistance;Sit to/from stand   Upper Body Dressing : Minimal assistance;Sitting   Lower Body Dressing: Maximal assistance;Sit to/from stand   Toilet Transfer: Moderate assistance;+2 for physical assistance;Stand-pivot   Toileting- Clothing Manipulation and Hygiene: Maximal assistance       Functional mobility during ADLs: Moderate assistance;+2 for safety/equipment General ADL Comments: States LB ADL tasks have become more difficlt to copmlete. Increased SOB with minimal activity     Vision Baseline Vision/History: Wears glasses (states he wears bifocals) Patient Visual Report: Diplopia;Blurring of vision Vision Assessment?: Yes Eye Alignment: Impaired (comment) Ocular Range of Motion: Restricted looking up;Impaired-to be further tested in functional context;Restricted on the right Tracking/Visual Pursuits: Impaired - to be further tested in functional context;Decreased smoothness of horizontal tracking;Decreased smoothness of vertical tracking Saccades: Additional eye shifts occurred during testing;Additional head turns occurred during testing;Decreased speed of saccadic movement;Impaired - to be further tested in functional context Visual Fields: Other (comment) (hitting targets in all quadrants but  not seeing objects  in L field on sheet) Diplopia Assessment: Present in far gaze;Objects split side to side;Objects split on top of one another Depth Perception: Overshoots     Perception Perception Perception Tested?: Yes Comments: mild L inattention   Praxis Praxis Praxis tested?: Deficits Praxis-Other Comments: will further assess; L limb arpraxia    Pertinent Vitals/Pain Pain Assessment: No/denies pain     Hand Dominance Left   Extremity/Trunk Assessment Upper Extremity Assessment Upper Extremity Assessment: LUE deficits/detail LUE Deficits / Details: apparent sensory motor deficits/clumsy hand; LUE drift present; poor finger to nose LUE Coordination: decreased fine motor;decreased gross motor   Lower Extremity Assessment Lower Extremity Assessment: Defer to PT evaluation   Cervical / Trunk Assessment Cervical / Trunk Assessment: Other exceptions (L bias)   Communication Communication Communication: No difficulties   Cognition Arousal/Alertness: Awake/alert Behavior During Therapy: WFL for tasks assessed/performed Overall Cognitive Status: Impaired/Different from baseline Area of Impairment: Attention;Safety/judgement;Awareness;Problem solving                   Current Attention Level: Selective     Safety/Judgement: Decreased awareness of safety;Decreased awareness of deficits Awareness: Emergent Problem Solving: Slow processing;Requires verbal cues;Requires tactile cues General Comments: leaning L - unaware of abnormal positioning of trunk   General Comments  Poor separation of head movement from eye movement; poor eye contact during session; aware of having a stroke and need for rehab    Exercises     Shoulder Instructions      Home Living Family/patient expects to be discharged to:: Private residence Living Arrangements: Alone Available Help at Discharge: Family;Available PRN/intermittently Type of Home: Apartment Home Access: Level entry     Home Layout: One  level     Bathroom Shower/Tub: Tub/shower unit;Curtain   Biochemist, clinical: Standard Bathroom Accessibility: Yes How Accessible: Accessible via walker Home Equipment: Crutches          Prior Functioning/Environment Level of Independence: Independent with assistive device(s)        Comments: uses a crutch when he has a gout attack (drives; pays his own bills; has intentions of working out)        OT Problem List: Decreased strength;Decreased activity tolerance;Impaired balance (sitting and/or standing);Impaired vision/perception;Decreased coordination;Decreased cognition;Decreased safety awareness;Decreased knowledge of use of DME or AE;Cardiopulmonary status limiting activity;Impaired UE functional use      OT Treatment/Interventions: Self-care/ADL training;Therapeutic exercise;Neuromuscular education;Energy conservation;DME and/or AE instruction;Therapeutic activities;Cognitive remediation/compensation;Visual/perceptual remediation/compensation;Patient/family education;Balance training    OT Goals(Current goals can be found in the care plan section) Acute Rehab OT Goals Patient Stated Goal: to recover and be independent again OT Goal Formulation: With patient Time For Goal Achievement: 05/12/21 Potential to Achieve Goals: Good  OT Frequency: Min 2X/week   Barriers to D/C:            Co-evaluation              AM-PAC OT "6 Clicks" Daily Activity     Outcome Measure Help from another person eating meals?: None Help from another person taking care of personal grooming?: A Little Help from another person toileting, which includes using toliet, bedpan, or urinal?: A Lot Help from another person bathing (including washing, rinsing, drying)?: A Lot Help from another person to put on and taking off regular upper body clothing?: A Little Help from another person to put on and taking off regular lower body clothing?: A Lot 6 Click Score: 16   End of Session Equipment  Utilized During Treatment: Gait  belt Nurse Communication: Mobility status;Other (comment) (DC needs)  Activity Tolerance: Patient tolerated treatment well Patient left: in bed;with call bell/phone within reach  OT Visit Diagnosis: Unsteadiness on feet (R26.81);Other abnormalities of gait and mobility (R26.89);Low vision, both eyes (H54.2);Apraxia (R48.2);Other symptoms and signs involving cognitive function;Muscle weakness (generalized) (M62.81)                Time: 2878-6767 OT Time Calculation (min): 38 min Charges:  OT General Charges $OT Visit: 1 Visit OT Evaluation $OT Eval Moderate Complexity: [place], OT/L   Acute OT Clinical Specialist Acute Rehabilitation Services Pager [phone] Office [phone]   [facility] 04/28/2021, 10:24 AM

## 2021-04-29 DIAGNOSIS — I639 Cerebral infarction, unspecified: Secondary | ICD-10-CM

## 2021-04-29 DIAGNOSIS — I4819 Other persistent atrial fibrillation: Secondary | ICD-10-CM

## 2021-04-29 DIAGNOSIS — F1910 Other psychoactive substance abuse, uncomplicated: Secondary | ICD-10-CM

## 2021-04-29 DIAGNOSIS — Z8739 Personal history of other diseases of the musculoskeletal system and connective tissue: Secondary | ICD-10-CM | POA: Diagnosis not present

## 2021-04-29 DIAGNOSIS — D72829 Elevated white blood cell count, unspecified: Secondary | ICD-10-CM

## 2021-04-29 DIAGNOSIS — B182 Chronic viral hepatitis C: Secondary | ICD-10-CM

## 2021-04-29 DIAGNOSIS — Z8546 Personal history of malignant neoplasm of prostate: Secondary | ICD-10-CM

## 2021-04-29 LAB — BASIC METABOLIC PANEL
Anion gap: 7 (ref 5–15)
BUN: 21 mg/dL (ref 8–23)
CO2: 21 mmol/L — ABNORMAL LOW (ref 22–32)
Calcium: 8.6 mg/dL — ABNORMAL LOW (ref 8.9–10.3)
Chloride: 110 mmol/L (ref 98–111)
Creatinine, Ser: 1.42 mg/dL — ABNORMAL HIGH (ref 0.61–1.24)
GFR, Estimated: 52 mL/min — ABNORMAL LOW (ref >60)
Glucose, Bld: 111 mg/dL — ABNORMAL HIGH (ref 70–99)
Potassium: 3.6 mmol/L (ref 3.5–5.1)
Sodium: 138 mmol/L (ref 135–145)

## 2021-04-29 LAB — CBC
HCT: 45.2 % (ref 39.0–52.0)
Hemoglobin: 15.5 g/dL (ref 13.0–17.0)
MCH: 29.9 pg (ref 26.0–34.0)
MCHC: 34.3 g/dL (ref 30.0–36.0)
MCV: 87.3 fL (ref 80.0–100.0)
Platelets: 162 10*3/uL (ref 150–400)
RBC: 5.18 MIL/uL (ref 4.22–5.81)
RDW: 14.2 % (ref 11.5–15.5)
WBC: 10.6 10*3/uL — ABNORMAL HIGH (ref 4.0–10.5)
nRBC: 0 % (ref 0.0–0.2)

## 2021-04-29 LAB — PATHOLOGIST SMEAR REVIEW

## 2021-04-29 NOTE — PMR Pre-admission (Addendum)
PMR Admission Coordinator Pre-Admission Assessment  Patient: Jordan Ake. is an 75 y.o., male MRN: [id number] DOB: [birth year] Height: 6' (182.9 cm) Weight: 95.7 kg              Insurance Information HMO:     PPO: yes     PCP:      IPA:      80/20:      OTHER:  PRIMARY: [employer] [place]/Medicare plan      Policy#: [id number]      Subscriber: pt CM Name: Jordan [name]     Phone#:  [phone]   Fax#: [phone] Approved starting 5/6 by Jordan [name] for 7 days, next review date: 5/63 Pre-Cert#: [id number]     Employer:  Benefits:  Phone #: [phone]     Name: 5/4 Eff. Date: 12/27/2020     Deduct: $500      Out of Pocket Max: $1800      Life Max: none  CIR: 80%      SNF: 80% Outpatient: 100%     Co-Pay: visits per medical neccesity Home Health: 100%      Co-Pay: visits per medical neccesity DME: 100%     Co-Pay: no Auth required Providers: in network  SECONDARY: none        Financial Counselor:       Phone#:   [employer]" for patients in Inpatient Rehabilitation Facilities with attached "Privacy Act [place] Records" was provided and verbally reviewed with: Patient  Emergency Contact Information Contact Information     Name Relation Home Work Mobile   [place] Sister   [phone]      Current Medical History  Patient Admitting Diagnosis: CVA  History of Present Illness: 75 y.o. 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   UDS negative and  INR- 1.3 at admission. CT head showed subacute anterior right thalamic infarct and CTA head negative for LVO but showed enhancement defects proximal ICA bilaterally. MRI/MRA brain showed subacute infarct in ventral and medial right thalamus with small amount of petechial hemorrhage. Echocardiogram with EF of 45-50% with severe bi atrial dilatation and mild AVR.  Carotid dopplers  were negative for significant ICA stenosis. Dr. Erlinda Hong felt that stroke likely due to small vessel disease v/s subtherapeutic INR and recommended ASA with repeat CT head 5-7 days and transition to [place]. Therapy evaluations completed yesterday revealing unsteady gait with left lean, elevated BP with activity as well as diplopia and gait abnormality affecting ADLs.   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  Prior Rehab/Hospitalizations:  Has the  patient had prior rehab or hospitalizations prior to admission? Yes  Has the patient had major surgery during 100 days prior to admission? No  Current Medications   Current Facility-Administered Medications:    acetaminophen (TYLENOL) tablet 650 mg, 650 mg, Oral, Q4H PRN **OR** acetaminophen (TYLENOL) 160 MG/5ML solution 650 mg, 650 mg, Per Tube, Q4H PRN **OR** acetaminophen (TYLENOL) suppository 650 mg, 650 mg, Rectal, Q4H PRN, Posey Pronto, Vishal R, MD   allopurinol (ZYLOPRIM) tablet 100 mg, 100 mg, Oral, Daily, Posey Pronto, Vishal R, MD, 100 mg at 05/01/21 7741   aspirin EC tablet 81 mg, 81 mg, Oral, Daily, Zada Finders R, MD, 81 mg at 05/01/21 2878   atorvastatin (LIPITOR) tablet 40 mg, 40 mg, Oral, Daily, Rosalin Hawking, MD, 40 mg at 05/01/21 6767   carvedilol (COREG) tablet 25 mg, 25 mg, Oral, BID WC, Coree Brame, Vishal  R, MD, 25 mg at 05/01/21 2094   heparin injection 5,000 Units, 5,000 Units, Subcutaneous, Q8H, Zada Finders R, MD, 5,000 Units at 05/01/21 1422   hydrALAZINE (APRESOLINE) tablet 25 mg, 25 mg, Oral, Q6H PRN, Zada Finders R, MD, 25 mg at 04/28/21 0240   losartan (COZAAR) tablet 100 mg, 100 mg, Oral, Daily, Vann, Jessica U, DO, 100 mg at 05/01/21 7096   metoprolol tartrate (LOPRESSOR) injection 5 mg, 5 mg, Intravenous, Q4H PRN, Posey Pronto, Vishal R, MD   senna-docusate (Senokot-S) tablet 1 tablet, 1 tablet, Oral, QHS PRN, Lenore Cordia, MD  Patients Current Diet:  Diet Order             Diet - low sodium heart healthy           Diet Heart Room service appropriate? Yes; Fluid consistency: Thin  Diet effective now                   Precautions / Restrictions Precautions Precautions: Fall Precaution Comments: vision deficits - partial occlusion R nasal visual field Restrictions Weight Bearing Restrictions: No   Has the patient had 2 or more falls or a fall with injury in the past year?No  Prior Activity Level Community (5-7x/wk): independent, driving, retired from [employer], Geophysicist/field seismologist  Prior Functional Level Prior Function Level of Independence: Independent with assistive device(s) Comments: uses a crutch when he has a gout attack. drives; pays his own bills; has intentions of working out  Self Care: Did the patient need help bathing, dressing, using the toilet or eating?  Independent  Indoor Mobility: Did the patient need assistance with walking from room to room (with or without device)? Independent  Stairs: Did the patient need assistance with internal or external stairs (with or without device)? Independent  Functional Cognition: Did the patient need help planning regular tasks such as shopping or remembering to take medications? Independent  Home Assistive Devices / Equipment Home Assistive Devices/Equipment: None Home Equipment: Crutches  Prior Device Use:  Indicate devices/aids used by the patient prior to current illness, exacerbation or injury?  crutch with gout flares  Current Functional Level Cognition  Arousal/Alertness: Awake/alert Overall Cognitive Status: Impaired/Different from baseline Current Attention Level: Selective Orientation Level: Oriented X4 Following Commands: Follows multi-step commands with increased time Safety/Judgement: Decreased awareness of safety General Comments: mildly inattentive to L, cues to correct. Pt fearful of falling throughout standing activity. Attention: Alternating,Selective Selective Attention: Impaired Selective Attention Impairment: Verbal complex,Functional complex Alternating Attention: Impaired Alternating Attention Impairment: Verbal complex,Functional complex Memory: Impaired Memory Impairment: Decreased recall of new information,Decreased short term memory Awareness: Impaired Problem Solving: Impaired Problem Solving  Impairment: Verbal complex,Functional complex Executive Function: Organizing,Self Monitoring Organizing: Impaired Organizing Impairment: Verbal complex,Functional complex Safety/Judgment: Impaired    Extremity Assessment (includes Sensation/Coordination)  Upper Extremity Assessment: Defer to OT evaluation LUE Deficits / Details: apparent sensory motor deficits/clumsy hand; LUE drift present; poor finger to nose LUE Coordination: decreased fine motor,decreased gross motor  Lower Extremity Assessment: LLE deficits/detail LLE Deficits / Details: Decreased coordination noted during heel to shin test and difficulty taking steps. Functional weakness noted.    ADLs  Overall ADL's : Needs assistance/impaired Eating/Feeding: Modified independent Grooming: Set up,Supervision/safety,Sitting Upper Body Bathing: Set up,Supervision/ safety,Sitting Lower Body Bathing: Moderate assistance,Sit to/from stand Upper Body Dressing : Minimal assistance,Sitting Lower Body Dressing:  Moderate assistance,Sit to/from stand Toilet Transfer: Moderate assistance,RW,Ambulation Armed forces technical officer Details (indicate cue type and reason): Min A with ambulating to bathroom however when turning requires at least mod A to prevent fall @ RW level; L bias Toileting- Clothing Manipulation and Hygiene: Maximal assistance Functional mobility during ADLs: Moderate assistance,Rolling walker,Cueing for safety,Cueing for sequencing General ADL Comments: States LB ADL tasks have become more difficlt to copmlete. Increased SOB with minimal activity    Mobility  Overal bed mobility: Needs Assistance Bed Mobility: Supine to Sit Supine to sit: Min assist Sit to supine: Min assist General bed mobility comments: min assist to progress LLE to EOB, scoot forward.    Transfers  Overall transfer level: Needs assistance Equipment used: Rolling walker (2 wheeled) Transfers: Sit to/from Stand Sit to Stand: Min assist General transfer comment: min assist for power up, rise, and steady. verbal cuing for hand placement when rising/sitting each time. STS as intervention, x5 throughout session    Ambulation / Gait / Stairs / Wheelchair Mobility  Ambulation/Gait Ambulation/Gait assistance: Herbalist (Feet): 30 Feet (x2) Assistive device: Rolling walker (2 wheeled) Gait Pattern/deviations: Step-through pattern,Decreased stride length,Trunk flexed General Gait Details: min assist to steady, guide pt and RW during direction change. VC for placement in RW, upright posture, increasing step length. Gait velocity: decr    Posture / Balance Dynamic Sitting Balance Sitting balance - Comments: L lateral lean and required BUE and external support. Balance Overall balance assessment: Needs assistance Sitting-balance support: Bilateral upper extremity supported,Feet supported Sitting balance-Leahy Scale: Fair Sitting balance - Comments: L lateral lean and required BUE and external support. Postural  control: Left lateral lean Standing balance support: Bilateral upper extremity supported Standing balance-Leahy Scale: Poor Standing balance comment: L lateral lean noted. Required external support to maintain balance.    Special needs/care consideration On coumadin pta , but INR subtherapeutic   Previous Home Environment  Living Arrangements: Alone  Lives With: Alone Available Help at Discharge: Family,Available PRN/intermittently Type of Home: Apartment Home Layout: One level Home Access: Level entry Bathroom Shower/Tub: Tub/shower unit,Curtain Biochemist, clinical: Standard Bathroom Accessibility: Yes How Accessible: Accessible via walker Drain: No  Discharge Living Setting Plans for Discharge Living Setting: Patient's home,Apartment,Alone Type of Home at Discharge: Apartment Discharge Home Layout: One level Discharge Home Access: Level entry Discharge Bathroom Shower/Tub: Tub/shower unit Discharge Bathroom Toilet: Standard Discharge Bathroom Accessibility: Yes How Accessible: Accessible via walker Does the patient have any problems obtaining your medications?: No  Social/Family/Support Systems Contact Information: Pamala Hurry , sister Anticipated Caregiver: family intermittently Anticipated Caregiver's Contact Information: see above Ability/Limitations of Caregiver: prn only Caregiver Availability: Intermittent Discharge Plan Discussed with Primary Caregiver: Yes Is Caregiver In Agreement with Plan?: Yes Does Caregiver/Family have Issues with Lodging/Transportation while Pt is in Rehab?: No  Goals Patient/Family  Goal for Rehab: mod I ot supervision with PT and OT Expected length of stay: ELOS 9-13 days Pt/Family Agrees to Admission and willing to participate: Yes Program Orientation Provided & Reviewed with Pt/Caregiver Including Roles  & Responsibilities: Yes  Decrease burden of Care through IP rehab admission: n/a  Possible need for SNF placement upon  discharge:not anticipated  Patient Condition: This patient's condition remains as documented in the consult dated 04/29/2021, in which the Rehabilitation Physician determined and documented that the patient's condition is appropriate for intensive rehabilitative care in an inpatient rehabilitation facility. Will admit to inpatient rehab today.  Preadmission Screen Completed By: Danne Baxter RN MSN with updates by  Genella Mech, CCC-SLP, 05/01/2021 3:25 PM ______________________________________________________________________   Discussed status with Dr. Posey Pronto on 05/01/21 at 66 and received approval for admission today.  Admission Coordinator: Danne Baxter RN MSN with updates by   Genella Mech, time 05/01/21 Sudie Grumbling [phone]

## 2021-04-29 NOTE — Progress Notes (Signed)
PROGRESS NOTE    [name]  WUJ:[id number] DOB: 1946-07-14 DOA: 04/27/2021 PCP: [name], MD   Brief Narrative:   Patient presented with gait imbalance and blurry vision, with hallucinations that started days prior to admission.  MRI showing subacute CVA.  Neurology consulted.  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 Embolic due to subtherapeutic INR -MRI brain showed subacute infarct of the ventral and right medial thalamus with small amount of petechial hemorrhage without mass-effect. (Suspect MRI is reading subacute given hemorrhagic conversion, this is likely acute.) -MRA brain normal -Echocardiogram: Atrial fibrillation present. Left ventricular ejection fraction, by  estimation, is 45 to 50%. The left ventricle has mildly decreased  function. The left ventricle demonstrates regional wall motion  abnormalities (see scoring diagram/findings for  description). Left ventricular diastolic parameters are indeterminate -Toxicology screen unremarkable -LDL 103, hemoglobin A1c 5.6 -PT/OT recommending CIR -Neurology consulted and appreciated: warfarin daily prior to admission, now on aspirin 81 mg daily given HT. Discussed with pt and he is in agreement with DOAC. Will recommend to repeat CT head in 5-7 days and start eliquis if CT and clinical stable. Once eliquis stated, ASA can be discontinued.   Elevated troponin -High-sensitivity troponin 139 --> 125 --> 140, not consistent with ACS  Elevated BNP -BNP 446.7 -Patient does not appear to be volume overloaded- has chronic left lower extremity edema -No history of CHF, although EF in 2018 was noted to be 45 to 50% however EF improved to 60 to 65% in 2019 -outpatient follow up  Persistent atrial fibrillation with subtherapeutic INR -Patient has been on Coumadin however INR has been inconsistent -Continue Coreg -plan for DOAC as an  outpatient   Essential hypertension -Continue Coreg -Losartan currently held  Chronic kidney disease, stage III -GFR has ranged within stage IIIa-IIIb since 2019 -Creatinine appears to be stable, continue to monitor  Polycythemia -Possibly due to dehydration -Continue IV fluids and monitor CBC  H/o hep C -repeat labs ordered and patient can follow up with ID   DVT Prophylaxis Heparin  Code Status: Full  Family Communication: None at bedside  Disposition Plan:  Status is: inpt  The patient will require care spanning > 2 midnights and should be moved to inpatient because: Inpatient level of care appropriate due to severity of illness  Dispo: The patient is from: Home              Anticipated d/c is to: CIR              Patient currently is medically stable to d/c.   Difficult to place patient No   Consultants Neurology    Subjective:    Still having trouble with vision  Objective:   Vitals:   04/29/21 0027 04/29/21 0428 04/29/21 0815 04/29/21 1131  BP: (!) 141/86 (!) 139/98 (!) 125/93 127/89  Pulse: 76 78 72 65  Resp: 18 18 18 18   Temp: 97.7 F (36.5 C) 97.9 F (36.6 C) 98.2 F (36.8 C) 98.7 F (37.1 C)  TempSrc: Oral Oral Oral Oral  SpO2: 100% 98% 94% 98%  Weight:      Height:        Intake/Output Summary (Last 24 hours) at 04/29/2021 1227 Last data filed at 04/29/2021 0432 Gross per 24 hour  Intake 360 ml  Output 800 ml  Net -440 ml   Filed Weights   04/28/21 0552  Weight:  95.7 kg    Exam  General: Appearance:     Overweight male in no acute distress     Lungs:     respirations unlabored  Heart:    Normal heart rate.irr  MS:   All extremities are intact.   Neurologic:   Awake, alert, oriented x 3. pleasant and cooperative    Data Reviewed: I have personally reviewed following labs and imaging studies  CBC: Recent Labs  Lab 04/27/21 2008 04/28/21 0446 04/29/21 0110  WBC 8.6 9.3 10.6*  NEUTROABS 5.8  --   --   HGB 19.2* 16.6  15.5  HCT 55.7* 49.2 45.2  MCV 88.6 89.6 87.3  PLT 205 169 0000000   Basic Metabolic Panel: Recent Labs  Lab 04/27/21 2008 04/27/21 2213 04/28/21 0446 04/29/21 0110  NA 138  --  138 138  K 3.6  --  3.6 3.6  CL 105  --  107 110  CO2 22  --  22 21*  GLUCOSE 105*  --  119* 111*  BUN 28*  --  25* 21  CREATININE 1.69*  --  1.69* 1.42*  CALCIUM 9.4  --  8.7* 8.6*  MG  --  2.0  --   --    GFR: Estimated Creatinine Clearance: 54.7 mL/min (A) (by C-G formula based on SCr of 1.42 mg/dL (H)). Liver Function Tests: Recent Labs  Lab 04/27/21 2008 04/28/21 0446  AST 63* 40  ALT 36 28  ALKPHOS 77 58  BILITOT 2.0* 1.4*  PROT 7.7 6.2*  ALBUMIN 3.0* 2.5*   No results for input(s): LIPASE, AMYLASE in the last 168 hours. No results for input(s): AMMONIA in the last 168 hours. Coagulation Profile: Recent Labs  Lab 04/27/21 2213 04/28/21 0446  INR 1.3* 1.5*   Cardiac Enzymes: No results for input(s): CKTOTAL, CKMB, CKMBINDEX, TROPONINI in the last 168 hours. BNP (last 3 results) No results for input(s): PROBNP in the last 8760 hours. HbA1C: Recent Labs    04/28/21 0447  HGBA1C 5.6   CBG: No results for input(s): GLUCAP in the last 168 hours. Lipid Profile: Recent Labs    04/28/21 0446  CHOL 150  HDL 37*  LDLCALC 103*  TRIG 51  CHOLHDL 4.1   Thyroid Function Tests: Recent Labs    04/27/21 2213  TSH 2.994   Anemia Panel: No results for input(s): VITAMINB12, FOLATE, FERRITIN, TIBC, IRON, RETICCTPCT in the last 72 hours. Urine analysis:    Component Value Date/Time   COLORURINE AMBER (A) 04/27/2021 2311   APPEARANCEUR HAZY (A) 04/27/2021 2311   LABSPEC 1.019 04/27/2021 2311   PHURINE 5.0 04/27/2021 2311   GLUCOSEU NEGATIVE 04/27/2021 2311   HGBUR SMALL (A) 04/27/2021 2311   BILIRUBINUR NEGATIVE 04/27/2021 2311   BILIRUBINUR n 01/06/2015 0917   KETONESUR NEGATIVE 04/27/2021 2311   PROTEINUR >=300 (A) 04/27/2021 2311   UROBILINOGEN negative 01/06/2015 0917    NITRITE NEGATIVE 04/27/2021 2311   LEUKOCYTESUR NEGATIVE 04/27/2021 2311     Recent Results (from the past 240 hour(s))  SARS CORONAVIRUS 2 (TAT 6-24 HRS) Nasopharyngeal Nasopharyngeal Swab     Status: None   Collection Time: 04/28/21  4:41 AM   Specimen: Nasopharyngeal Swab  Result Value Ref Range Status   SARS Coronavirus 2 NEGATIVE NEGATIVE Final    Comment: (NOTE) SARS-CoV-2 target nucleic acids are NOT DETECTED.  The SARS-CoV-2 RNA is generally detectable in upper and lower respiratory specimens during the acute phase of infection. Negative results do not preclude SARS-CoV-2 infection,  do not rule out co-infections with other pathogens, and should not be used as the sole basis for treatment or other patient management decisions. Negative results must be combined with clinical observations, patient history, and epidemiological information. The expected result is Negative.  Fact Sheet for Patients: HairSlick.no  Fact Sheet for Healthcare Providers: quierodirigir.com  This test is not yet approved or cleared by the [place] FDA and  has been authorized for detection and/or diagnosis of SARS-CoV-2 by FDA under an Emergency Use Authorization (EUA). This EUA will remain  in effect (meaning this test can be used) for the duration of the COVID-19 declaration under Se ction 564(b)(1) of the Act, 21 U.S.C. section 360bbb-3(b)(1), unless the authorization is terminated or revoked sooner.  Performed at [facility], [address], [place], [place] [address]       Radiology Studies: DG Chest 2 View  Result Date: 04/27/2021 CLINICAL DATA:  Weak and dizzy EXAM: CHEST - 2 VIEW COMPARISON:  10/20/2018 FINDINGS: Mild cardiomegaly. No focal opacity or pleural effusion. Aortic atherosclerosis. No pneumothorax. IMPRESSION: No active cardiopulmonary disease.  Borderline to mild cardiomegaly Electronically Signed   By: Jasmine Pang M.D.   On: 04/27/2021 19:28   CT Head Wo Contrast  Result Date: 04/27/2021 CLINICAL DATA:  Dizziness and blurred vision. EXAM: CT HEAD WITHOUT CONTRAST TECHNIQUE: Contiguous axial images were obtained from the base of the skull through the vertex without intravenous contrast. COMPARISON:  October 20, 2018 FINDINGS: Brain: There is mild cerebral atrophy with widening of the extra-axial spaces and ventricular dilatation. There are areas of decreased attenuation within the white matter tracts of the supratentorial brain, consistent with microvascular disease changes. A 2.4 cm x 1.5 cm ill-defined area of low attenuation is seen within the anterior thalamic region on the right (axial CT image 16, CT series 4). This represents a new finding when compared to the prior exam. Very mild associated mass effect is seen with approximately 2 mm right to left midline shift (coronal reformatted image 40, CT series number 5). No associated acute hemorrhage is identified. Vascular: No hyperdense vessel or unexpected calcification. Skull: Normal. Negative for fracture or focal lesion. Sinuses/Orbits: No acute finding. Other: None. IMPRESSION: Subacute anterior right thalamic infarct, as described above. MRI correlation is recommended. Electronically Signed   By: Aram Candela M.D.   On: 04/27/2021 21:47   MR ANGIO HEAD WO CONTRAST  Result Date: 04/28/2021 CLINICAL DATA:  Neuro deficit, acute, stroke suspected; Neuro deficit, acute, stroke suspected Left cranial nerve 6 palsy EXAM: MRI HEAD WITHOUT AND WITH CONTRAST MRA HEAD WITHOUT CONTRAST TECHNIQUE: Multiplanar, multiecho pulse sequences of the brain and surrounding structures were obtained without and with intravenous contrast. Angiographic images of the head were obtained using MRA technique without contrast. CONTRAST:  75mL GADAVIST GADOBUTROL 1 MMOL/ML IV SOLN COMPARISON:  None. FINDINGS: MRI HEAD FINDINGS Brain: Subacute infarct of the ventral and medial  right thalamus. There is small amount of petechial hemorrhage without mass effect. No chronic microhemorrhage. There is multifocal hyperintense T2-weighted signal within the white matter. Generalized volume loss without a clear lobar predilection. Old right cerebellar infarct. There is no abnormal contrast enhancement. Vascular: Major flow voids are preserved. Skull and upper cervical spine: Normal calvarium and skull base. Visualized upper cervical spine and soft tissues are normal. Sinuses/Orbits:No paranasal sinus fluid levels or advanced mucosal thickening. No mastoid or middle ear effusion. Normal orbits. MRA HEAD FINDINGS POSTERIOR CIRCULATION: --Vertebral arteries: Normal --Inferior cerebellar arteries: Normal. --Basilar  artery: Normal. --Superior cerebellar arteries: Normal. --Posterior cerebral arteries: Normal. The right PCA is partially supplied by a posterior communicating artery (p-comm). ANTERIOR CIRCULATION: --Intracranial internal carotid arteries: Normal. --Anterior cerebral arteries (ACA): Normal. --Middle cerebral arteries (MCA): Normal. ANATOMIC VARIANTS: None IMPRESSION: 1. 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 2. Normal intracranial MRA. Electronically Signed   By: Ulyses Jarred M.D.   On: 04/28/2021 01:18   MR Brain W and Wo Contrast  Result Date: 04/28/2021 CLINICAL DATA:  Neuro deficit, acute, stroke suspected; Neuro deficit, acute, stroke suspected Left cranial nerve 6 palsy EXAM: MRI HEAD WITHOUT AND WITH CONTRAST MRA HEAD WITHOUT CONTRAST TECHNIQUE: Multiplanar, multiecho pulse sequences of the brain and surrounding structures were obtained without and with intravenous contrast. Angiographic images of the head were obtained using MRA technique without contrast. CONTRAST:  25mL GADAVIST GADOBUTROL 1 MMOL/ML IV SOLN COMPARISON:  None.  FINDINGS: MRI HEAD FINDINGS Brain: Subacute infarct of the ventral and medial right thalamus. There is small amount of petechial hemorrhage without mass effect. No chronic microhemorrhage. There is multifocal hyperintense T2-weighted signal within the white matter. Generalized volume loss without a clear lobar predilection. Old right cerebellar infarct. There is no abnormal contrast enhancement. Vascular: Major flow voids are preserved. Skull and upper cervical spine: Normal calvarium and skull base. Visualized upper cervical spine and soft tissues are normal. Sinuses/Orbits:No paranasal sinus fluid levels or advanced mucosal thickening. No mastoid or middle ear effusion. Normal orbits. MRA HEAD FINDINGS POSTERIOR CIRCULATION: --Vertebral arteries: Normal --Inferior cerebellar arteries: Normal. --Basilar artery: Normal. --Superior cerebellar arteries: Normal. --Posterior cerebral arteries: Normal. The right PCA is partially supplied by a posterior communicating artery (p-comm). ANTERIOR CIRCULATION: --Intracranial internal carotid arteries: Normal. --Anterior cerebral arteries (ACA): Normal. --Middle cerebral arteries (MCA): Normal. ANATOMIC VARIANTS: None IMPRESSION: 1. 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 2. Normal intracranial MRA. Electronically Signed   By: Ulyses Jarred M.D.   On: 04/28/2021 01:18   ECHOCARDIOGRAM COMPLETE  Result Date: 04/28/2021    ECHOCARDIOGRAM REPORT   Patient Name:   Lorrin Pinsky. Date of Exam: 04/28/2021 Medical Rec #:  [id number]             Height:       72.0 in Accession #:    [id number]            Weight:       211.0 lb Date of Birth:  08/12/1946              BSA:          2.180 m Patient Age:    39 years              BP:           148/87 mmHg Patient Gender: M                     HR:           105 bpm. Exam Location:   Inpatient Procedure: 2D Echo Indications:    Stroke I63.9  History:        Patient has prior history of Echocardiogram examinations, most                 recent 12/07/2018. Arrythmias:Atrial  Fibrillation; Risk                 Factors:Hypertension.  Sonographer:    Mikki Santee RDCS (AE) Referring Phys: [id number] [place]  1. Atrial fibrillation present. Left ventricular ejection fraction, by estimation, is 45 to 50%. The left ventricle has mildly decreased function. The left ventricle demonstrates regional wall motion abnormalities (see scoring diagram/findings for description). Left ventricular diastolic parameters are indeterminate.  2. Right ventricular systolic function is normal. The right ventricular size is mildly enlarged. There is normal pulmonary artery systolic pressure.  3. Left atrial size was severely dilated.  4. Right atrial size was severely dilated.  5. The mitral valve is normal in structure. No evidence of mitral valve regurgitation. No evidence of mitral stenosis.  6. The aortic valve is tricuspid. Aortic valve regurgitation is mild. Mild to moderate aortic valve sclerosis/calcification is present, without any evidence of aortic stenosis.  7. There is borderline dilatation of the ascending aorta, measuring 39 mm.  8. The inferior vena cava is normal in size with greater than 50% respiratory variability, suggesting right atrial pressure of 3 mmHg. FINDINGS  Left Ventricle: Atrial fibrillation present. Left ventricular ejection fraction, by estimation, is 45 to 50%. The left ventricle has mildly decreased function. The left ventricle demonstrates regional wall motion abnormalities. The left ventricular internal cavity size was normal in size. There is no left ventricular hypertrophy. Left ventricular diastolic parameters are indeterminate.  LV Wall Scoring: The basal inferior segment is akinetic. The mid inferoseptal segment, mid inferior segment, and basal inferoseptal  segment are hypokinetic. Right Ventricle: The right ventricular size is mildly enlarged. No increase in right ventricular wall thickness. Right ventricular systolic function is normal. There is normal pulmonary artery systolic pressure. The tricuspid regurgitant velocity is 2.14  m/s, and with an assumed right atrial pressure of 3 mmHg, the estimated right ventricular systolic pressure is [id number] mmHg. Left Atrium: Left atrial size was severely dilated. Right Atrium: Right atrial size was severely dilated. Pericardium: There is no evidence of pericardial effusion. Mitral Valve: The mitral valve is normal in structure. No evidence of mitral valve regurgitation. No evidence of mitral valve stenosis. Tricuspid Valve: The tricuspid valve is normal in structure. Tricuspid valve regurgitation is not demonstrated. No evidence of tricuspid stenosis. Aortic Valve: The aortic valve is tricuspid. Aortic valve regurgitation is mild. Aortic regurgitation PHT measures 714 msec. Mild to moderate aortic valve sclerosis/calcification is present, without any evidence of aortic stenosis. Pulmonic Valve: The pulmonic valve was normal in structure. Pulmonic valve regurgitation is not visualized. No evidence of pulmonic stenosis. Aorta: The aortic root is normal in size and structure. There is borderline dilatation of the ascending aorta, measuring 39 mm. Venous: The inferior vena cava is normal in size with greater than 50% respiratory variability, suggesting right atrial pressure of 3 mmHg. IAS/Shunts: No atrial level shunt detected by color flow Doppler.  LEFT VENTRICLE PLAX 2D LVIDd:         4.70 cm LVIDs:         3.30 cm LV PW:         1.50 cm LV IVS:        1.30 cm LVOT diam:     2.30 cm LV SV:         33 LV SV Index:   15 LVOT Area:     4.15 cm  RIGHT VENTRICLE RV S prime:     8.25 cm/s LEFT ATRIUM  Index       RIGHT ATRIUM           Index LA diam:      5.90 cm  2.71 cm/m  RA Area:     26.40 cm LA Vol (A2C): 100.0 ml  45.88 ml/m RA Volume:   92.70 ml  42.53 ml/m LA Vol (A4C): 127.0 ml 58.27 ml/m  AORTIC VALVE LVOT Vmax:   69.20 cm/s LVOT Vmean:  49.500 cm/s LVOT VTI:    0.081 m AI PHT:      714 msec  AORTA Ao Root diam: 3.30 cm MITRAL VALVE               TRICUSPID VALVE MV Area (PHT): 4.86 cm    TR Peak grad:   18.3 mmHg MV Decel Time: 156 msec    TR Vmax:        214.00 cm/s MV E velocity: 74.60 cm/s MV A velocity: 29.60 cm/s  SHUNTS MV E/A ratio:  2.52        Systemic VTI:  0.08 m                            Systemic Diam: 2.30 cm Candee Furbish MD Electronically signed by Candee Furbish MD Signature Date/Time: 04/28/2021/11:04:13 AM    Final    VAS US CAROTID  Result Date: 04/29/2021 Carotid Arterial Duplex Study Patient Name:  Christopherjohn Ghere.  Date of Exam:   04/28/2021 Medical Rec #: [id number]              Accession #:    [id number] Date of Birth: [birth year]               Patient Gender: M Patient Age:   75Y Exam Location:  [facility] Procedure:      VAS US CAROTID Referring Phys: [id number] Rosalin Hawking --------------------------------------------------------------------------------  Indications:       CVA. Bilateral carotid bulb abnormality seen on CTA. Risk Factors:      Hypertension. Comparison Study:  No prior study Performing Technologist: Maudry Mayhew MHA, RDMS, RVT, RDCS  Examination Guidelines: A complete evaluation includes B-mode imaging, spectral Doppler, color Doppler, and power Doppler as needed of all accessible portions of each vessel. Bilateral testing is considered an integral part of a complete examination. Limited examinations for reoccurring indications may be performed as noted.  Right Carotid Findings: +----------+--------+--------+--------+------------------+--------+           PSV cm/sEDV cm/sStenosisPlaque DescriptionComments +----------+--------+--------+--------+------------------+--------+ CCA Prox  50      12                                          +----------+--------+--------+--------+------------------+--------+ CCA Distal47      15                                         +----------+--------+--------+--------+------------------+--------+ ICA Prox  20      6                                          +----------+--------+--------+--------+------------------+--------+ ICA Distal35      9                                          +----------+--------+--------+--------+------------------+--------+  ECA       49      8                                          +----------+--------+--------+--------+------------------+--------+ +----------+--------+-------+----------------+-------------------+           PSV cm/sEDV cmsDescribe        Arm Pressure (mmHG) +----------+--------+-------+----------------+-------------------+ [id number]             Multiphasic, WNL                    +----------+--------+-------+----------------+-------------------+ +---------+--------+--+--------+-+---------+ VertebralPSV cm/s14EDV cm/s5Antegrade +---------+--------+--+--------+-+---------+  Left Carotid Findings: +----------+--------+--------+--------+------------------+--------+           PSV cm/sEDV cm/sStenosisPlaque DescriptionComments +----------+--------+--------+--------+------------------+--------+ CCA Prox  45      13                                         +----------+--------+--------+--------+------------------+--------+ CCA Distal34      9                                          +----------+--------+--------+--------+------------------+--------+ ICA Prox  13      6                                          +----------+--------+--------+--------+------------------+--------+ ICA Distal31      11                                         +----------+--------+--------+--------+------------------+--------+ ECA       64      13                                          +----------+--------+--------+--------+------------------+--------+ +----------+--------+--------+----------------+-------------------+           PSV cm/sEDV cm/sDescribe        Arm Pressure (mmHG) +----------+--------+--------+----------------+-------------------+ [id number]              Multiphasic, WNL                    +----------+--------+--------+----------------+-------------------+ +---------+--------+--+--------+-+---------+ VertebralPSV cm/s19EDV cm/s8Antegrade +---------+--------+--+--------+-+---------+   Summary: Right Carotid: The extracranial vessels were near-normal with only minimal wall                thickening or plaque. Left Carotid: The extracranial vessels were near-normal with only minimal wall               thickening or plaque. Vertebrals:  Bilateral vertebral arteries demonstrate antegrade flow. Subclavians: Normal flow hemodynamics were seen in bilateral subclavian              arteries. *See table(s) above for measurements and observations.  Electronically signed by Antony Contras MD on 04/29/2021 at 8:28:45 AM.    Final    CT ANGIO HEAD CODE STROKE  Result Date: 04/28/2021 CLINICAL DATA:  Dizziness and hallucinations EXAM: CT ANGIOGRAPHY  HEAD AND NECK TECHNIQUE: Multidetector CT imaging of the head and neck was performed using the standard protocol during bolus administration of intravenous contrast. Multiplanar CT image reconstructions and MIPs were obtained to evaluate the vascular anatomy. Carotid stenosis measurements (when applicable) are obtained utilizing NASCET criteria, using the distal internal carotid diameter as the denominator. CONTRAST:  48mL OMNIPAQUE IOHEXOL 350 MG/ML SOLN COMPARISON:  MRA brain 04/28/2021 FINDINGS: CTA NECK FINDINGS SKELETON: There is no bony spinal canal stenosis. No lytic or blastic lesion. OTHER NECK: Normal pharynx, larynx and major salivary glands. No cervical lymphadenopathy. Unremarkable thyroid gland. UPPER CHEST: No  pneumothorax or pleural effusion. No nodules or masses. AORTIC ARCH: There is calcific atherosclerosis of the aortic arch. There is no aneurysm, dissection or hemodynamically significant stenosis of the visualized portion of the aorta. Conventional 3 vessel aortic branching pattern. The visualized proximal subclavian arteries are widely patent. CAROTID SYSTEMS: There are symmetric, curvilinear enhancement defects within both proximal internal carotid arteries. There is no stenosis of either carotid system. VERTEBRAL ARTERIES: Codominant configuration. Both origins are clearly patent. There is no dissection, occlusion or flow-limiting stenosis to the skull base (V1-V3 segments). CTA HEAD FINDINGS POSTERIOR CIRCULATION: --Vertebral arteries: Normal V4 segments. --Inferior cerebellar arteries: Normal. --Basilar artery: Normal. --Superior cerebellar arteries: Normal. --Posterior cerebral arteries (PCA): Normal. The right PCA is partially supplied by a posterior communicating artery (p-comm). ANTERIOR CIRCULATION: --Intracranial internal carotid arteries: Moderate narrowing of the right ICA at the skull base. Mild bilateral atherosclerotic calcification. --Anterior cerebral arteries (ACA): Normal. Both A1 segments are present. Patent anterior communicating artery (a-comm). --Middle cerebral arteries (MCA): Normal. VENOUS SINUSES: As permitted by contrast timing, patent. ANATOMIC VARIANTS: None Review of the MIP images confirms the above findings. IMPRESSION: 1. No intracranial arterial occlusion or high-grade stenosis. 2. Symmetric, curvilinear enhancement defects within both proximal internal carotid arteries, favored to be artifacts related to flow turbulence. Carotid web is a secondary consideration, but the appearance is not typical. Dissection or thromboembolism are unlikely given the symmetry. 3. 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 Aortic Atherosclerosis (ICD10-I70.0).  Electronically Signed   By: Ulyses Jarred M.D.   On: 04/28/2021 02:30   CT ANGIO NECK CODE STROKE  Result Date: 04/28/2021 CLINICAL DATA:  Dizziness and hallucinations EXAM: CT ANGIOGRAPHY HEAD AND NECK TECHNIQUE: Multidetector CT imaging of the head and neck was performed using the standard protocol during bolus administration of intravenous contrast. Multiplanar CT image reconstructions and MIPs were obtained to evaluate the vascular anatomy. Carotid stenosis measurements (when applicable) are obtained utilizing NASCET criteria, using the distal internal carotid diameter as the denominator. CONTRAST:  68mL OMNIPAQUE IOHEXOL 350 MG/ML SOLN COMPARISON:  MRA brain 04/28/2021 FINDINGS: CTA NECK FINDINGS SKELETON: There is no bony spinal canal stenosis. No lytic or blastic lesion. OTHER NECK: Normal pharynx, larynx and major salivary glands. No cervical lymphadenopathy. Unremarkable thyroid gland. UPPER CHEST: No pneumothorax or pleural effusion. No nodules or masses. AORTIC ARCH: There is calcific atherosclerosis of the aortic arch. There is no aneurysm, dissection or hemodynamically significant stenosis of the visualized portion of the aorta. Conventional 3 vessel aortic branching pattern. The visualized proximal subclavian arteries are widely patent. CAROTID SYSTEMS: There are symmetric, curvilinear enhancement defects within both proximal internal carotid arteries. There is no stenosis of either carotid system. VERTEBRAL ARTERIES: Codominant configuration. Both origins are clearly patent. There is no dissection, occlusion or flow-limiting stenosis to the skull base (V1-V3 segments). CTA HEAD FINDINGS POSTERIOR CIRCULATION: --Vertebral arteries: Normal  V4 segments. --Inferior cerebellar arteries: Normal. --Basilar artery: Normal. --Superior cerebellar arteries: Normal. --Posterior cerebral arteries (PCA): Normal. The right PCA is partially supplied by a posterior communicating artery (p-comm). ANTERIOR  CIRCULATION: --Intracranial internal carotid arteries: Moderate narrowing of the right ICA at the skull base. Mild bilateral atherosclerotic calcification. --Anterior cerebral arteries (ACA): Normal. Both A1 segments are present. Patent anterior communicating artery (a-comm). --Middle cerebral arteries (MCA): Normal. VENOUS SINUSES: As permitted by contrast timing, patent. ANATOMIC VARIANTS: None Review of the MIP images confirms the above findings. IMPRESSION: 1. No intracranial arterial occlusion or high-grade stenosis. 2. Symmetric, curvilinear enhancement defects within both proximal internal carotid arteries, favored to be artifacts related to flow turbulence. Carotid web is a secondary consideration, but the appearance is not typical. Dissection or thromboembolism are unlikely given the symmetry. 3. 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 Aortic Atherosclerosis (ICD10-I70.0). Electronically Signed   By: Ulyses Jarred M.D.   On: 04/28/2021 02:30     Scheduled Meds: . allopurinol  100 mg Oral Daily  . aspirin EC  81 mg Oral Daily  . atorvastatin  40 mg Oral Daily  . carvedilol  25 mg Oral BID WC  . heparin  5,000 Units Subcutaneous Q8H   Continuous Infusions:    LOS: 1 day   Time Spent in minutes   45 minutes  Geradine Girt D.O. on 04/29/2021 at 12:27 PM  Between 7am to 7pm - Please see pager noted on [email or web address]  After 7pm go to [email or web address]  And look for the night coverage person covering for me after hours  [facility] Office  [phone]

## 2021-04-29 NOTE — Progress Notes (Addendum)
Nutrition Brief Note  Patient identified on the Malnutrition Screening Tool (MST) Report  Wt Readings from Last 15 Encounters:  04/28/21 95.7 kg  11/24/20 93.4 kg  10/12/19 102.1 kg  02/14/19 95.7 kg  01/16/19 95.7 kg  01/09/19 96.5 kg  12/04/18 98.1 kg  11/26/18 101.6 kg  10/20/18 101.6 kg  06/13/18 101.6 kg  05/03/18 103 kg  05/13/17 104 kg  04/11/17 102.1 kg  02/15/17 104.3 kg  01/07/17 103.5 kg    Body mass index is 28.62 kg/m. Patient meets criteria for overweight based on current BMI.   No significant weight changes noted recently. Nutrition focused physical exam completed.  No muscle or subcutaneous fat depletion noticed.  Current diet order is heart healthy, patient is consuming approximately 100% of meals at this time. Labs and medications reviewed.   No nutrition interventions warranted at this time. If nutrition issues arise, please consult RD.    Lucas Mallow, RD, LDN, CNSC Please refer to [facility] for contact information.

## 2021-04-29 NOTE — Progress Notes (Signed)
Occupational Therapy Treatment Patient Details Name: Jordan [name]. 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 Past medical history of atrial fibrillation/atrial flutter on Coumadin, history of DVT, hepatitis C, gout, hypertension, prostate cancer, prior history of substance abuse, current intermittent marijuana use.   OT comments  Pt is making progress toward OT goals. Pt continues to have diplopia (images are "diagonal"). Partial occlusion used over nasal portion of non-dominant R eye to decrease appearance of double image and improve functional vision for ADL and mobility. Midline orientation during mobility improved with use of partial occlusion. Apparent sensory motor impairment with use of LUE. Requires min A for UB ADL and mod A with LB ADL. Mod A with mobility @ RW level due to deficits with midline orientation and deficits listed below. Supportive family present during session. Pt very motivated to become more independent and is an excellent candidate  CIR. Pt very appreciative. Will continue to follow acutely.   Follow Up Recommendations  CIR    Equipment Recommendations  3 in 1 bedside commode    Recommendations for Other Services  Rehab consult    Precautions / Restrictions Precautions Precautions: Fall Precaution Comments: vision deficits       Mobility Bed Mobility               General bed mobility comments: OOB inchair    Transfers Overall transfer level: Needs assistance Equipment used: Rolling walker (2 wheeled) Transfers: Sit to/from Stand Sit to Stand: Mod assist         General transfer comment: Min A after several repetitions; L bias    Balance     Sitting balance-Leahy Scale: Fair       Standing balance-Leahy  Scale: Poor                             ADL either performed or assessed with clinical judgement   ADL Overall ADL's : Needs assistance/impaired Eating/Feeding: Modified independent   Grooming: Set up;Supervision/safety;Sitting   Upper Body Bathing: Set up;Supervision/ safety;Sitting   Lower Body Bathing: Moderate assistance;Sit to/from stand   Upper Body Dressing : Minimal assistance;Sitting   Lower Body Dressing: Moderate assistance;Sit to/from stand   Toilet Transfer: Moderate assistance;RW;Ambulation Toilet Transfer Details (indicate cue type and reason): Min A with ambulating to bathroom however when turning requires at least mod A to prevent fall @ RW level; L bias Toileting- Clothing Manipulation and Hygiene: Maximal assistance       Functional mobility during ADLs: Moderate assistance;Rolling walker;Cueing for safety;Cueing for sequencing       Vision   Vision Assessment?: Yes Diplopia Assessment: Objects split on top of one another;Objects split side to side Depth Perception: Overshoots Additional Comments: Continues to complain of diplopia - images more "diagonal"; Dysconjugate gaze. L eye ROM impaired. Appears to be L eye dominant; nasal portion of non-dominant R eye taped to partially occlude vision and reduce complaints of diplopia and improve functional vision   Perception     Praxis      Cognition Arousal/Alertness: Awake/alert Behavior During Therapy: WFL for tasks assessed/performed Overall Cognitive Status: Impaired/Different from baseline Area of Impairment: Attention;Memory;Safety/judgement;Awareness;Problem solving  Current Attention Level: Selective Memory: Decreased short-term memory   Safety/Judgement: Decreased awareness of safety;Decreased awareness of deficits Awareness: Emergent Problem Solving: Slow processing;Difficulty sequencing General Comments: L inattention        Exercises     Shoulder  Instructions       General Comments worked on increasing L scanning, L head turns    Pertinent Vitals/ Pain       Pain Assessment: No/denies pain  Home Living                                          Prior Functioning/Environment              Frequency  (P) Min 2X/week        Progress Toward Goals  OT Goals(current goals can now be found in the care plan section)  Progress towards OT goals: (P) Progressing toward goals  Acute Rehab OT Goals Patient Stated Goal: (P) to recover and be independent again OT Goal Formulation: (P) With patient Time For Goal Achievement: (P) 05/12/21 Potential to Achieve Goals: (P) Good ADL Goals Pt Will Perform Grooming: (P) with supervision;standing Pt Will Perform Upper Body Bathing: (P) sitting;with set-up Pt Will Perform Lower Body Bathing: (P) with supervision;sit to/from stand Pt Will Perform Lower Body Dressing: (P) with supervision;with adaptive equipment;sit to/from stand Pt Will Transfer to Toilet: (P) with supervision;bedside commode;ambulating Additional ADL Goal #1: (P) Pt will use partial occlusion galsses independently to improve functional vision for ADL and mobility and reduce risk of falls  Plan (P) Discharge plan remains appropriate    Co-evaluation                 AM-PAC OT "6 Clicks" Daily Activity     Outcome Measure   Help from another person eating meals?: (P) None Help from another person taking care of personal grooming?: (P) A Little Help from another person toileting, which includes using toliet, bedpan, or urinal?: (P) A Lot Help from another person bathing (including washing, rinsing, drying)?: (P) A Lot Help from another person to put on and taking off regular upper body clothing?: (P) A Little Help from another person to put on and taking off regular lower body clothing?: (P) A Lot 6 Click Score: (P) 16    End of Session Equipment Utilized During Treatment: Gait belt;Rolling  walker  OT Visit Diagnosis: (P) Unsteadiness on feet (R26.81);Other abnormalities of gait and mobility (R26.89);Low vision, both eyes (H54.2);Apraxia (R48.2);Other symptoms and signs involving cognitive function;Muscle weakness (generalized) (M62.81)   Activity Tolerance Patient tolerated treatment well   Patient Left in chair;with call bell/phone within reach;with chair alarm set   Nurse Communication (P) Mobility status        Time: (P) 1405-(P) 1440 OT Time Calculation (min): (P) 35 min  Charges: OT General Charges $OT Visit: (P) 1 Visit OT Treatments $Self Care/Home Management : (P) 8-22 mins $Neuromuscular Re-education: (P) 8-22 mins  Maurie Boettcher, OT/L   Acute OT Clinical Specialist Acute Rehabilitation Services Pager [phone] Office [phone]    [facility] 04/29/2021, 3:09 PM

## 2021-04-29 NOTE — Progress Notes (Signed)
Ok to repeat hep C VL and genotype to determine if he has been treated in the past per Dr. Eliseo Squires.  Onnie Boer, PharmD, BCIDP, AAHIVP, CPP Infectious Disease Pharmacist 04/29/2021 8:37 AM

## 2021-04-29 NOTE — Progress Notes (Signed)
Inpatient Rehabilitation Admissions Coordinator   I met with patient at bedside to discuss goals and expectations of a possible inpt rehab admit. He is in agreement. I will begin insurance  Auth for a possible CIR admit pending insurance approval.  Danne Baxter, RN, MSN Rehab Admissions Coordinator [phone] 04/29/2021 1:53 PM

## 2021-04-29 NOTE — Progress Notes (Addendum)
CCMD called to report pt had 8 beat run of v-tach. Pt is in bed resting and is asymptomatic at this time. Provider paged.

## 2021-04-29 NOTE — Progress Notes (Signed)
Physical Therapy Treatment Patient Details Name: Jordan [name]. 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 Past medical history of atrial fibrillation/atrial flutter on Coumadin, history of DVT, hepatitis C, gout, hypertension, prostate cancer, prior history of substance abuse, current intermittent marijuana use.    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  Follow Up Recommendations  CIR     Equipment Recommendations  Rolling walker with 5" wheels;3in1 (PT)    Recommendations for Other Services       Precautions / Restrictions Precautions Precautions: Fall    Mobility  Bed Mobility               General bed mobility comments: Recieved pt in chair    Transfers Overall transfer level: Needs assistance Equipment used: Rolling walker (2 wheeled) Transfers: Sit to/from Stand Sit to Stand: Mod assist;Min assist         General transfer comment: L lean improving; initial mod assist for more anterior weight shift and steady; progressed to min assist with reps  Ambulation/Gait Ambulation/Gait assistance: Mod assist;+2 safety/equipment (chair push) Gait Distance (Feet): 40 Feet (x 3 bouts) Assistive device: Rolling walker (2 wheeled) Gait Pattern/deviations: Decreased step length - left;Decreased  dorsiflexion - left;Trunk flexed     General Gait Details: Initially with very short step length and decr weight shift into single limb stance leading to short, almost festinating steps; Imporved with multimodal cues for weight shift, and timed verbal cues for long, slow steps   Stairs             Wheelchair Mobility    Modified Rankin (Stroke Patients Only)       Balance     Sitting balance-Leahy Scale: Fair       Standing balance-Leahy Scale: Poor                              Cognition Arousal/Alertness: Awake/alert Behavior During Therapy: WFL for tasks assessed/performed Overall Cognitive Status: Impaired/Different from baseline Area of Impairment: Attention;Safety/judgement;Awareness;Problem solving                           Awareness: Emergent   General Comments: L inattention persists      Exercises      General Comments General comments (skin integrity, edema, etc.): worked on increasing L scanning, L head turns      Pertinent Vitals/Pain Pain Assessment: No/denies pain    Home Living                      Prior Function            PT Goals (current goals can now be found in the care plan section) Acute Rehab PT Goals Patient Stated Goal: to recover and be  independent again PT Goal Formulation: With patient Time For Goal Achievement: 05/12/21 Potential to Achieve Goals: Good Progress towards PT goals: Progressing toward goals    Frequency    Min 4X/week      PT Plan Current plan remains appropriate    Co-evaluation              AM-PAC PT "6 Clicks" Mobility   Outcome Measure  Help needed turning from your back to your side while in a flat bed without using bedrails?: A Little Help needed moving from lying on your back to sitting on the side of a flat bed without using bedrails?: A Little Help needed moving to and from a bed to a chair (including a wheelchair)?: A Little Help needed  standing up from a chair using your arms (e.g., wheelchair or bedside chair)?: A Lot Help needed to walk in hospital room?: A Lot Help needed climbing 3-5 steps with a railing? : A Lot 6 Click Score: 15    End of Session Equipment Utilized During Treatment: Gait belt Activity Tolerance: Patient tolerated treatment well Patient left: in chair;with call bell/phone within reach (with Jordan [name] in to see pt)   PT Visit Diagnosis: Unsteadiness on feet (R26.81);Muscle weakness (generalized) (M62.81);Other symptoms and signs involving the nervous system (D92.426)     Time: 8341-9622 PT Time Calculation (min) (ACUTE ONLY): 20 min  Charges:  $Gait Training: 8-22 mins                     Jordan [name], [place]  Acute Rehabilitation Services Pager [phone] Office [phone]    Jordan [name] 04/29/2021, 2:15 PM

## 2021-04-30 MED ORDER — POTASSIUM CHLORIDE CRYS ER 20 MEQ PO TBCR
40.0000 meq | EXTENDED_RELEASE_TABLET | Freq: Once | ORAL | Status: AC
  Administered 2021-04-30: 40 meq via ORAL
  Filled 2021-04-30: qty 2

## 2021-04-30 MED ORDER — LOSARTAN POTASSIUM 50 MG PO TABS
100.0000 mg | ORAL_TABLET | Freq: Every day | ORAL | Status: DC
  Administered 2021-04-30 – 2021-05-01 (×2): 100 mg via ORAL
  Filled 2021-04-30 (×3): qty 2

## 2021-04-30 MED ORDER — ASPIRIN 81 MG PO TBEC: 81.0000 mg | DELAYED_RELEASE_TABLET | Freq: Every day | ORAL | 11 refills | Status: DC

## 2021-04-30 MED ORDER — ATORVASTATIN CALCIUM 40 MG PO TABS: 40.0000 mg | ORAL_TABLET | Freq: Every day | ORAL | Status: DC

## 2021-04-30 NOTE — Evaluation (Signed)
Speech Language Pathology Evaluation Patient Details Name: Jordan [name]. MRN: [id number] DOB: [birth year] Today's Date: 04/30/2021 Time: 9379-0240 SLP Time Calculation (min) (ACUTE ONLY): 15 min  Problem List:  Patient Active Problem List   Diagnosis Date Noted  . Chronic hepatitis C without hepatic coma ([place])   . History of gout   . Substance abuse ([place])   . Leukocytosis   . Acute CVA (cerebrovascular accident) ([place]) 04/28/2021  . Right thalamic infarction ([place]) 04/27/2021  . Personal history of colonic polyps 01/16/2019  . Family history of colon cancer in mother 01/16/2019  . Screening for lipid disorders 05/03/2018  . NICM (nonischemic cardiomyopathy) ([place]) 02/15/2017  . Chronic systolic heart failure ([place]) 02/15/2017  . Essential hypertension 01/07/2017  . Persistent atrial fibrillation ([place]) 03/15/2013  . Long term (current) use of anticoagulants 03/15/2013  . History of prostate cancer 02/16/2013   Past Medical History:  Past Medical History:  Diagnosis Date  . Atrial fib/flutter, transient    Hx  . Atrial fibrillation (H[facility])    coumadin  . Colon cancer [facility])    family hx  . Diverticulosis   . Gout   . Hemorrhoids   . Hepatitis C   . History of DVT (deep vein thrombosis)   . History of nuclear stress test 04/2002   exercise; normal study   . Hypertension   . 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 Personal history of colonic polyps   . Prostate cancer [facility])    radical prostatectomy    Past Surgical History:  Past Surgical History:  Procedure Laterality Date  . CARDIAC CATHETERIZATION  2007   no occlusive CAD  . COLONOSCOPY  9735,3299   [name]  . PROSTATECTOMY    . PROSTATECTOMY  04-04   Dr. Risa Grill  . 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 Past medical history of atrial fibrillation/atrial flutter on Coumadin, history of DVT, hepatitis C, gout, hypertension, prostate cancer, prior history of substance abuse, current intermittent marijuana use.   Assessment / Plan / Recommendation Clinical Impression  Pt presents with a mild cognitive impairment post CVA. PLOF pt independent with all ADLs. Pt oriented x4 and very motivated. Deficits in cognition included reduced novel recall and reduced delay recall, decreased executive function skills, reduced mental manipulation, and decreased complex problem solving skills. Verbal expression and auditory comprehension appeared intact as well as motor speech skills. Skilled ST intervention indicated for deficits mentioned above.    SLP Assessment  SLP Recommendation/Assessment: Patient needs continued Speech Lanaguage Pathology Services SLP Visit Diagnosis: Cognitive communication deficit (R41.841)    Follow Up Recommendations  Inpatient Rehab    Frequency and Duration min 1 x/week  2 weeks      SLP Evaluation Cognition  Overall Cognitive Status: Impaired/Different from baseline Arousal/Alertness: Awake/alert Orientation Level: Oriented X4 Attention: Alternating;Selective Selective Attention: Impaired Selective Attention Impairment: Verbal complex;Functional complex Alternating Attention: Impaired Alternating Attention Impairment: Verbal complex;Functional complex Memory: Impaired Memory Impairment: Decreased recall of new information;Decreased short term memory Awareness: Impaired Problem Solving: Impaired Problem Solving Impairment: Verbal complex;Functional complex Executive Function: Organizing;Self Monitoring Organizing: Impaired Organizing Impairment: Verbal complex;Functional complex Safety/Judgment: Impaired  Comprehension  Auditory Comprehension Overall Auditory Comprehension: Appears within functional limits for tasks assessed    Expression Expression Primary Mode of Expression: Verbal Verbal Expression Overall Verbal Expression: Appears within functional limits for tasks assessed Written Expression Dominant Hand: Left   Oral / Motor  Oral Motor/Sensory Function Overall Oral Motor/Sensory Function: Within functional limits Motor Speech Overall Motor Speech: Appears within functional limits for tasks assessed   GO                    Hayden Rasmussen MA, CCC-SLP Acute Rehabilitation Services   04/30/2021, 9:34 AM

## 2021-04-30 NOTE — Progress Notes (Signed)
Inpatient Rehabilitation Admissions Coordinator  I met at bedside with patient to review cost of care of CIR admit. I await insurance approval for possible admit.We will follow up tomorrow.   Danne Baxter, RN, MSN Rehab Admissions Coordinator [phone] 04/30/2021 2:28 PM

## 2021-04-30 NOTE — Progress Notes (Addendum)
Physical Therapy Treatment Patient Details Name: Jordan [name]. 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 Past medical history of atrial fibrillation/atrial flutter on Coumadin, history of DVT, hepatitis C, gout, hypertension, prostate cancer, prior history of substance abuse, current intermittent marijuana use.    PT Comments    Pt motivated to participate in PT today. Pt ambulatory x2 short hallway distances today, PT cuing pt for postural adjustments, RW management, attention to L especially during directional changes, and increasing step length bilaterally. Pt is fearful of falling today, requiring frequent rest. Pt demonstrating improvement in tolerance overall, will continue to recommend CIR and progress mobility as able.     Follow Up Recommendations  CIR     Equipment Recommendations  Rolling walker with 5" wheels;3in1 (PT)    Recommendations for Other Services       Precautions / Restrictions Precautions Precautions: Fall Precaution Comments: vision deficits - partial occlusion R nasal visual field Restrictions Weight Bearing Restrictions: No    Mobility  Bed Mobility Overal bed mobility: Needs Assistance Bed Mobility: Supine to Sit           General bed mobility comments: min assist to progress LLE to EOB, scoot forward.    Transfers Overall transfer level: Needs assistance Equipment used: Rolling walker (2 wheeled) Transfers: Sit to/from Stand Sit to Stand: Min assist         General transfer comment: min assist for power up, rise, and steady. verbal cuing for hand placement when rising/sitting each time. STS as intervention, x5 throughout session  Ambulation/Gait Ambulation/Gait assistance: Min assist Gait Distance  (Feet): 30 Feet (x2) Assistive device: Rolling walker (2 wheeled) Gait Pattern/deviations: Step-through pattern;Decreased stride length;Trunk flexed Gait velocity: decr   General Gait Details: min assist to steady, guide pt and RW during direction change. VC for placement in RW, upright posture, increasing step length.   Stairs             Wheelchair Mobility    Modified Rankin (Stroke Patients Only) Modified Rankin (Stroke Patients Only) Pre-Morbid Rankin Score: Slight disability Modified Rankin: Moderately severe disability     Balance Overall balance assessment: Needs assistance   Sitting balance-Leahy Scale: Fair       Standing balance-Leahy Scale: Poor                              Cognition Arousal/Alertness: Awake/alert Behavior During Therapy: WFL for tasks assessed/performed;Anxious Overall Cognitive Status: Impaired/Different from baseline Area of Impairment: Safety/judgement;Following commands;Problem solving                       Following Commands: Follows multi-step commands with increased time Safety/Judgement: Decreased awareness of safety   Problem Solving: Decreased initiation;Difficulty sequencing;Requires verbal cues;Requires tactile cues General Comments: mildly inattentive to L, cues to correct. Pt fearful of falling throughout standing activity.      Exercises      General Comments        Pertinent Vitals/Pain Pain Assessment: Faces Faces Pain Scale: Hurts little more Pain Location: L foot Pain Descriptors / Indicators: Sore;Discomfort;Grimacing Pain Intervention(s): Limited activity within patient's tolerance;Monitored during session;Repositioned    Home Living  Prior Function            PT Goals (current goals can now be found in the care plan section) Acute Rehab PT Goals Patient Stated Goal: to recover and be independent again PT Goal Formulation: With patient Time For  Goal Achievement: 05/12/21 Potential to Achieve Goals: Good Progress towards PT goals: Progressing toward goals    Frequency    Min 4X/week      PT Plan Current plan remains appropriate    Co-evaluation              AM-PAC PT "6 Clicks" Mobility   Outcome Measure  Help needed turning from your back to your side while in a flat bed without using bedrails?: A Little Help needed moving from lying on your back to sitting on the side of a flat bed without using bedrails?: A Little Help needed moving to and from a bed to a chair (including a wheelchair)?: A Little Help needed standing up from a chair using your arms (e.g., wheelchair or bedside chair)?: A Little Help needed to walk in hospital room?: A Little Help needed climbing 3-5 steps with a railing? : A Lot 6 Click Score: 17    End of Session Equipment Utilized During Treatment: Gait belt Activity Tolerance: Patient tolerated treatment well Patient left: in chair;with call bell/phone within reach (with Dr. Posey Pronto in to see pt) Nurse Communication: Mobility status PT Visit Diagnosis: Unsteadiness on feet (R26.81);Muscle weakness (generalized) (M62.81);Other symptoms and signs involving the nervous system (R29.898)     Time: 1100-1119 PT Time Calculation (min) (ACUTE ONLY): 19 min  Charges:  $Gait Training: 8-22 mins                     Stacie Glaze, PT DPT Acute Rehabilitation Services Pager [phone]  Office [phone]    [place] 04/30/2021, 2:03 PM

## 2021-04-30 NOTE — Discharge Summary (Addendum)
Physician Discharge Summary  [name] [id number] DOB: 1946-10-12 DOA: 04/27/2021  PCP: [name], MD  Admit date: 04/27/2021 Discharge date: 04/30/2021  Admitted From: home Discharge disposition: CIR   Recommendations for Outpatient Follow-Up:    aspirin 81 mg: recommend to repeat CT head in 5-7 days and start eliquis if CT and clinical stable. Once eliquis started, ASA can be discontinued.  HCV RNA pending   Discharge Diagnosis:   Principal Problem:   Right thalamic infarction [facility]) Active Problems:   Persistent atrial fibrillation (HCC)   Essential hypertension   Acute CVA (cerebrovascular accident) ([name])   Chronic hepatitis C without hepatic coma ([name])   History of gout   Substance abuse ([name])   Leukocytosis    Discharge Condition: Improved.  Diet recommendation: Low sodium, heart healthy.   Wound care: None.  Code status: Full.   History of Present Illness:   Jordan [name]. is a 75 y.o. male with medical history significant for persistent atrial fibrillation on Coumadin, NICM, history of DVT, hypertension, CKD stage III, gout, and prostate cancer s/p radical prostatectomy who presents to the ED for evaluation of gait abnormality.  Patient states on 04/25/2021 he had a couple glasses of wine.  Afterwards he started feeling somewhat confused with new double vision.  He felt as if he had been drinking more than he actually had.  The following day he felt like he was hallucinating thinking there were other acquaintances in his house when they were not.  He has been having difficulty walking with feeling off balance and lightheaded.  He felt weak in his legs.  He did not have any room spinning sensation.  He denied any new numbness or tingling.  He has not had any weakness in his arms.  He otherwise denies any headache, nausea, vomiting, chest pain, palpitations, cough, abdominal pain, dysuria, diarrhea.  He does note some dyspnea  with exertion.  He denies any obvious bleeding.    Hospital Course by Problem:    Acute CVA: acuterightthalamic infarct with petechial HT, small vessel source vs.Embolicdue to subtherapeutic INR -MRI brain showed subacute infarct of the ventral and right medial thalamus with small amount of petechial hemorrhage without mass-effect. (Suspect MRI is reading subacute given hemorrhagic conversion, this is likely acute.) -MRA brain normal -Echocardiogram: Atrial fibrillation present. Left ventricular ejection fraction, by  estimation, is 45 to 50%. The left ventricle has mildly decreased  function. The left ventricle demonstrates regional wall motion  abnormalities (see scoring diagram/findings for  description). Left ventricular diastolic parameters are indeterminate -Toxicology screen unremarkable -LDL 103, hemoglobin A1c 5.6 -PT/OT recommending CIR -Neurology consulted and appreciated: warfarin dailyprior to admission, now onaspirin 81 mg dailygiven HT. Discussed with pt and he is in agreement with DOAC. Will recommend to repeat CT head in 5-7 days and start eliquis if CT and clinical stable. Once eliquis started, ASA can be discontinued.  Elevated troponin -High-sensitivity troponin 139 --> 125 --> 140, not consistent with ACS  Elevated BNP -BNP 446.7 -Patient does not appear to be volume overloaded- has chronic left lower extremity edema -No history of CHF, although EF in 2018 was noted to be 45 to 50% however EF improved to 60 to 65% in 2019 -outpatient follow up  Persistent atrial fibrillation with subtherapeutic INR -Patient has been on Coumadin however INR has been inconsistent -Continue Coreg -plan for DOAC as an outpatient if repeat head CT negative  Essential hypertension -Continue Coreg- resume losartan  Chronic kidney disease, stage III undetermined -GFR has ranged within stage IIIa-IIIb since 2019 -Creatinine appears to be stable, continue to  monitor  Polycythemia -Possibly due to dehydration -resolved  H/o hep C -repeat labs ordered and patient can follow up with ID    Medical Consultants:   Neuro rehab   Discharge Exam:   Vitals:   04/30/21 0820 04/30/21 1147  BP: (!) 137/98 (!) 162/100  Pulse: (!) 56 76  Resp: 18 18  Temp: 98.6 F (37 C) 98.2 F (36.8 C)  SpO2: 98% 100%   Vitals:   04/30/21 0008 04/30/21 0438 04/30/21 0820 04/30/21 1147  BP: (!) 131/93 (!) 133/96 (!) 137/98 (!) 162/100  Pulse: 84 97 (!) 56 76  Resp: 18 18 18 18   Temp: 97.8 F (36.6 C) 98.6 F (37 C) 98.6 F (37 C) 98.2 F (36.8 C)  TempSrc: Oral Oral Oral Oral  SpO2: 100% 99% 98% 100%  Weight:      Height:        General exam: Appears calm and comfortable.    The results of significant diagnostics from this hospitalization (including imaging, microbiology, ancillary and laboratory) are listed below for reference.     Procedures and Diagnostic Studies:   DG Chest 2 View  Result Date: 04/27/2021 CLINICAL DATA:  Weak and dizzy EXAM: CHEST - 2 VIEW COMPARISON:  10/20/2018 FINDINGS: Mild cardiomegaly. No focal opacity or pleural effusion. Aortic atherosclerosis. No pneumothorax. IMPRESSION: No active cardiopulmonary disease.  Borderline to mild cardiomegaly Electronically Signed   By: Donavan Foil M.D.   On: 04/27/2021 19:28   CT Head Wo Contrast  Result Date: 04/27/2021 CLINICAL DATA:  Dizziness and blurred vision. EXAM: CT HEAD WITHOUT CONTRAST TECHNIQUE: Contiguous axial images were obtained from the base of the skull through the vertex without intravenous contrast. COMPARISON:  October 20, 2018 FINDINGS: Brain: There is mild cerebral atrophy with widening of the extra-axial spaces and ventricular dilatation. There are areas of decreased attenuation within the white matter tracts of the supratentorial brain, consistent with microvascular disease changes. A 2.4 cm x 1.5 cm ill-defined area of low attenuation is seen within the  anterior thalamic region on the right (axial CT image 16, CT series 4). This represents a new finding when compared to the prior exam. Very mild associated mass effect is seen with approximately 2 mm right to left midline shift (coronal reformatted image 40, CT series number 5). No associated acute hemorrhage is identified. Vascular: No hyperdense vessel or unexpected calcification. Skull: Normal. Negative for fracture or focal lesion. Sinuses/Orbits: No acute finding. Other: None. IMPRESSION: Subacute anterior right thalamic infarct, as described above. MRI correlation is recommended. Electronically Signed   By: Virgina Norfolk M.D.   On: 04/27/2021 21:47   MR ANGIO HEAD WO CONTRAST  Result Date: 04/28/2021 CLINICAL DATA:  Neuro deficit, acute, stroke suspected; Neuro deficit, acute, stroke suspected Left cranial nerve 6 palsy EXAM: MRI HEAD WITHOUT AND WITH CONTRAST MRA HEAD WITHOUT CONTRAST TECHNIQUE: Multiplanar, multiecho pulse sequences of the brain and surrounding structures were obtained without and with intravenous contrast. Angiographic images of the head were obtained using MRA technique without contrast. CONTRAST:  30mL GADAVIST GADOBUTROL 1 MMOL/ML IV SOLN COMPARISON:  None. FINDINGS: MRI HEAD FINDINGS Brain: Subacute infarct of the ventral and medial right thalamus. There is small amount of petechial hemorrhage without mass effect. No chronic microhemorrhage. There is multifocal hyperintense T2-weighted signal within the white matter. Generalized volume loss without a clear lobar predilection. Old  right cerebellar infarct. There is no abnormal contrast enhancement. Vascular: Major flow voids are preserved. Skull and upper cervical spine: Normal calvarium and skull base. Visualized upper cervical spine and soft tissues are normal. Sinuses/Orbits:No paranasal sinus fluid levels or advanced mucosal thickening. No mastoid or middle ear effusion. Normal orbits. MRA HEAD FINDINGS POSTERIOR CIRCULATION:  --Vertebral arteries: Normal --Inferior cerebellar arteries: Normal. --Basilar artery: Normal. --Superior cerebellar arteries: Normal. --Posterior cerebral arteries: Normal. The right PCA is partially supplied by a posterior communicating artery (p-comm). ANTERIOR CIRCULATION: --Intracranial internal carotid arteries: Normal. --Anterior cerebral arteries (ACA): Normal. --Middle cerebral arteries (MCA): Normal. ANATOMIC VARIANTS: None IMPRESSION: 1. 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 2. Normal intracranial MRA. Electronically Signed   By: Ulyses Jarred M.D.   On: 04/28/2021 01:18   MR Brain W and Wo Contrast  Result Date: 04/28/2021 CLINICAL DATA:  Neuro deficit, acute, stroke suspected; Neuro deficit, acute, stroke suspected Left cranial nerve 6 palsy EXAM: MRI HEAD WITHOUT AND WITH CONTRAST MRA HEAD WITHOUT CONTRAST TECHNIQUE: Multiplanar, multiecho pulse sequences of the brain and surrounding structures were obtained without and with intravenous contrast. Angiographic images of the head were obtained using MRA technique without contrast. CONTRAST:  78mL GADAVIST GADOBUTROL 1 MMOL/ML IV SOLN COMPARISON:  None. FINDINGS: MRI HEAD FINDINGS Brain: Subacute infarct of the ventral and medial right thalamus. There is small amount of petechial hemorrhage without mass effect. No chronic microhemorrhage. There is multifocal hyperintense T2-weighted signal within the white matter. Generalized volume loss without a clear lobar predilection. Old right cerebellar infarct. There is no abnormal contrast enhancement. Vascular: Major flow voids are preserved. Skull and upper cervical spine: Normal calvarium and skull base. Visualized upper cervical spine and soft tissues are normal. Sinuses/Orbits:No paranasal sinus fluid levels or advanced mucosal thickening. No mastoid or  middle ear effusion. Normal orbits. MRA HEAD FINDINGS POSTERIOR CIRCULATION: --Vertebral arteries: Normal --Inferior cerebellar arteries: Normal. --Basilar artery: Normal. --Superior cerebellar arteries: Normal. --Posterior cerebral arteries: Normal. The right PCA is partially supplied by a posterior communicating artery (p-comm). ANTERIOR CIRCULATION: --Intracranial internal carotid arteries: Normal. --Anterior cerebral arteries (ACA): Normal. --Middle cerebral arteries (MCA): Normal. ANATOMIC VARIANTS: None IMPRESSION: 1. 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 2. Normal intracranial MRA. Electronically Signed   By: Ulyses Jarred M.D.   On: 04/28/2021 01:18   ECHOCARDIOGRAM COMPLETE  Result Date: 04/28/2021    ECHOCARDIOGRAM REPORT   Patient Name:   Jordan [name]. Date of Exam: 04/28/2021 Medical Rec #:  [id number]             Height:       72.0 in Accession #:    [id number]            Weight:       211.0 lb Date of Birth:  September 09, 1946              BSA:          2.180 m Patient Age:    75 years              BP:           148/87 mmHg Patient Gender: M  HR:           105 bpm. Exam Location:  Inpatient Procedure: 2D Echo Indications:    Stroke I63.9  History:        Patient has prior history of Echocardiogram examinations, most                 recent 12/07/2018. Arrythmias:Atrial Fibrillation; Risk                 Factors:Hypertension.  Sonographer:    Mikki Santee RDCS (AE) Referring Phys: [id number] [place]  1. Atrial fibrillation present. Left ventricular ejection fraction, by estimation, is 45 to 50%. The left ventricle has mildly decreased function. The left ventricle demonstrates regional wall motion abnormalities (see scoring diagram/findings for description). Left ventricular diastolic parameters are indeterminate.   2. Right ventricular systolic function is normal. The right ventricular size is mildly enlarged. There is normal pulmonary artery systolic pressure.  3. Left atrial size was severely dilated.  4. Right atrial size was severely dilated.  5. The mitral valve is normal in structure. No evidence of mitral valve regurgitation. No evidence of mitral stenosis.  6. The aortic valve is tricuspid. Aortic valve regurgitation is mild. Mild to moderate aortic valve sclerosis/calcification is present, without any evidence of aortic stenosis.  7. There is borderline dilatation of the ascending aorta, measuring 39 mm.  8. The inferior vena cava is normal in size with greater than 50% respiratory variability, suggesting right atrial pressure of 3 mmHg. FINDINGS  Left Ventricle: Atrial fibrillation present. Left ventricular ejection fraction, by estimation, is 45 to 50%. The left ventricle has mildly decreased function. The left ventricle demonstrates regional wall motion abnormalities. The left ventricular internal cavity size was normal in size. There is no left ventricular hypertrophy. Left ventricular diastolic parameters are indeterminate.  LV Wall Scoring: The basal inferior segment is akinetic. The mid inferoseptal segment, mid inferior segment, and basal inferoseptal segment are hypokinetic. Right Ventricle: The right ventricular size is mildly enlarged. No increase in right ventricular wall thickness. Right ventricular systolic function is normal. There is normal pulmonary artery systolic pressure. The tricuspid regurgitant velocity is 2.14  m/s, and with an assumed right atrial pressure of 3 mmHg, the estimated right ventricular systolic pressure is 42.3 mmHg. Left Atrium: Left atrial size was severely dilated. Right Atrium: Right atrial size was severely dilated. Pericardium: There is no evidence of pericardial effusion. Mitral Valve: The mitral valve is normal in structure. No evidence of mitral valve regurgitation. No  evidence of mitral valve stenosis. Tricuspid Valve: The tricuspid valve is normal in structure. Tricuspid valve regurgitation is not demonstrated. No evidence of tricuspid stenosis. Aortic Valve: The aortic valve is tricuspid. Aortic valve regurgitation is mild. Aortic regurgitation PHT measures 714 msec. Mild to moderate aortic valve sclerosis/calcification is present, without any evidence of aortic stenosis. Pulmonic Valve: The pulmonic valve was normal in structure. Pulmonic valve regurgitation is not visualized. No evidence of pulmonic stenosis. Aorta: The aortic root is normal in size and structure. There is borderline dilatation of the ascending aorta, measuring 39 mm. Venous: The inferior vena cava is normal in size with greater than 50% respiratory variability, suggesting right atrial pressure of 3 mmHg. IAS/Shunts: No atrial level shunt detected by color flow Doppler.  LEFT VENTRICLE PLAX 2D LVIDd:         4.70 cm LVIDs:         3.30 cm LV PW:  1.50 cm LV IVS:        1.30 cm LVOT diam:     2.30 cm LV SV:         33 LV SV Index:   15 LVOT Area:     4.15 cm  RIGHT VENTRICLE RV S prime:     8.25 cm/s LEFT ATRIUM            Index       RIGHT ATRIUM           Index LA diam:      5.90 cm  2.71 cm/m  RA Area:     26.40 cm LA Vol (A2C): 100.0 ml 45.88 ml/m RA Volume:   92.70 ml  42.53 ml/m LA Vol (A4C): 127.0 ml 58.27 ml/m  AORTIC VALVE LVOT Vmax:   69.20 cm/s LVOT Vmean:  49.500 cm/s LVOT VTI:    0.081 m AI PHT:      714 msec  AORTA Ao Root diam: 3.30 cm MITRAL VALVE               TRICUSPID VALVE MV Area (PHT): 4.86 cm    TR Peak grad:   18.3 mmHg MV Decel Time: 156 msec    TR Vmax:        214.00 cm/s MV E velocity: 74.60 cm/s MV A velocity: 29.60 cm/s  SHUNTS MV E/A ratio:  2.52        Systemic VTI:  0.08 m                            Systemic Diam: 2.30 cm Candee Furbish MD Electronically signed by Candee Furbish MD Signature Date/Time: 04/28/2021/11:04:13 AM    Final    VAS US CAROTID  Result Date:  04/29/2021 Carotid Arterial Duplex Study Patient Name:  Jerami Brodrick.  Date of Exam:   04/28/2021 Medical Rec #: [id number]              Accession #:    [id number] Date of Birth: 1946-02-07               Patient Gender: M Patient Age:   75Y Exam Location:  [facility] Procedure:      VAS US CAROTID Referring Phys: [id number] Rosalin Hawking --------------------------------------------------------------------------------  Indications:       CVA. Bilateral carotid bulb abnormality seen on CTA. Risk Factors:      Hypertension. Comparison Study:  No prior study Performing Technologist: Maudry Mayhew MHA, RDMS, RVT, RDCS  Examination Guidelines: A complete evaluation includes B-mode imaging, spectral Doppler, color Doppler, and power Doppler as needed of all accessible portions of each vessel. Bilateral testing is considered an integral part of a complete examination. Limited examinations for reoccurring indications may be performed as noted.  Right Carotid Findings: +----------+--------+--------+--------+------------------+--------+           PSV cm/sEDV cm/sStenosisPlaque DescriptionComments +----------+--------+--------+--------+------------------+--------+ CCA Prox  50      12                                         +----------+--------+--------+--------+------------------+--------+ CCA Distal47      15                                         +----------+--------+--------+--------+------------------+--------+  ICA Prox  20      6                                          +----------+--------+--------+--------+------------------+--------+ ICA Distal35      9                                          +----------+--------+--------+--------+------------------+--------+ ECA       49      8                                          +----------+--------+--------+--------+------------------+--------+ +----------+--------+-------+----------------+-------------------+            PSV cm/sEDV cmsDescribe        Arm Pressure (mmHG) +----------+--------+-------+----------------+-------------------+ [id number]             Multiphasic, WNL                    +----------+--------+-------+----------------+-------------------+ +---------+--------+--+--------+-+---------+ VertebralPSV cm/s14EDV cm/s5Antegrade +---------+--------+--+--------+-+---------+  Left Carotid Findings: +----------+--------+--------+--------+------------------+--------+           PSV cm/sEDV cm/sStenosisPlaque DescriptionComments +----------+--------+--------+--------+------------------+--------+ CCA Prox  45      13                                         +----------+--------+--------+--------+------------------+--------+ CCA Distal34      9                                          +----------+--------+--------+--------+------------------+--------+ ICA Prox  13      6                                          +----------+--------+--------+--------+------------------+--------+ ICA Distal31      11                                         +----------+--------+--------+--------+------------------+--------+ ECA       64      13                                         +----------+--------+--------+--------+------------------+--------+ +----------+--------+--------+----------------+-------------------+           PSV cm/sEDV cm/sDescribe        Arm Pressure (mmHG) +----------+--------+--------+----------------+-------------------+ [id number]              Multiphasic, WNL                    +----------+--------+--------+----------------+-------------------+ +---------+--------+--+--------+-+---------+ VertebralPSV cm/s19EDV cm/s8Antegrade +---------+--------+--+--------+-+---------+   Summary: Right Carotid: The extracranial vessels were near-normal with only minimal wall                thickening or plaque. Left  Carotid: The extracranial vessels were  near-normal with only minimal wall               thickening or plaque. Vertebrals:  Bilateral vertebral arteries demonstrate antegrade flow. Subclavians: Normal flow hemodynamics were seen in bilateral subclavian              arteries. *See table(s) above for measurements and observations.  Electronically signed by Delia Heady MD on 04/29/2021 at 8:28:45 AM.    Final    CT ANGIO HEAD CODE STROKE  Result Date: 04/28/2021 CLINICAL DATA:  Dizziness and hallucinations EXAM: CT ANGIOGRAPHY HEAD AND NECK TECHNIQUE: Multidetector CT imaging of the head and neck was performed using the standard protocol during bolus administration of intravenous contrast. Multiplanar CT image reconstructions and MIPs were obtained to evaluate the vascular anatomy. Carotid stenosis measurements (when applicable) are obtained utilizing NASCET criteria, using the distal internal carotid diameter as the denominator. CONTRAST:  44mL OMNIPAQUE IOHEXOL 350 MG/ML SOLN COMPARISON:  MRA brain 04/28/2021 FINDINGS: CTA NECK FINDINGS SKELETON: There is no bony spinal canal stenosis. No lytic or blastic lesion. OTHER NECK: Normal pharynx, larynx and major salivary glands. No cervical lymphadenopathy. Unremarkable thyroid gland. UPPER CHEST: No pneumothorax or pleural effusion. No nodules or masses. AORTIC ARCH: There is calcific atherosclerosis of the aortic arch. There is no aneurysm, dissection or hemodynamically significant stenosis of the visualized portion of the aorta. Conventional 3 vessel aortic branching pattern. The visualized proximal subclavian arteries are widely patent. CAROTID SYSTEMS: There are symmetric, curvilinear enhancement defects within both proximal internal carotid arteries. There is no stenosis of either carotid system. VERTEBRAL ARTERIES: Codominant configuration. Both origins are clearly patent. There is no dissection, occlusion or flow-limiting stenosis to the skull base (V1-V3 segments). CTA HEAD FINDINGS POSTERIOR  CIRCULATION: --Vertebral arteries: Normal V4 segments. --Inferior cerebellar arteries: Normal. --Basilar artery: Normal. --Superior cerebellar arteries: Normal. --Posterior cerebral arteries (PCA): Normal. The right PCA is partially supplied by a posterior communicating artery (p-comm). ANTERIOR CIRCULATION: --Intracranial internal carotid arteries: Moderate narrowing of the right ICA at the skull base. Mild bilateral atherosclerotic calcification. --Anterior cerebral arteries (ACA): Normal. Both A1 segments are present. Patent anterior communicating artery (a-comm). --Middle cerebral arteries (MCA): Normal. VENOUS SINUSES: As permitted by contrast timing, patent. ANATOMIC VARIANTS: None Review of the MIP images confirms the above findings. IMPRESSION: 1. No intracranial arterial occlusion or high-grade stenosis. 2. Symmetric, curvilinear enhancement defects within both proximal internal carotid arteries, favored to be artifacts related to flow turbulence. Carotid web is a secondary consideration, but the appearance is not typical. Dissection or thromboembolism are unlikely given the symmetry. 3. 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 Aortic Atherosclerosis (ICD10-I70.0). Electronically Signed   By: Deatra Robinson M.D.   On: 04/28/2021 02:30   CT ANGIO NECK CODE STROKE  Result Date: 04/28/2021 CLINICAL DATA:  Dizziness and hallucinations EXAM: CT ANGIOGRAPHY HEAD AND NECK TECHNIQUE: Multidetector CT imaging of the head and neck was performed using the standard protocol during bolus administration of intravenous contrast. Multiplanar CT image reconstructions and MIPs were obtained to evaluate the vascular anatomy. Carotid stenosis measurements (when applicable) are obtained utilizing NASCET criteria, using the distal internal carotid diameter as the denominator. CONTRAST:  48mL OMNIPAQUE IOHEXOL 350 MG/ML SOLN COMPARISON:  MRA brain 04/28/2021 FINDINGS: CTA NECK FINDINGS  SKELETON: There is no bony spinal canal stenosis. No lytic or blastic lesion. OTHER NECK: Normal pharynx, larynx and major salivary glands. No cervical lymphadenopathy. Unremarkable thyroid gland. UPPER  CHEST: No pneumothorax or pleural effusion. No nodules or masses. AORTIC ARCH: There is calcific atherosclerosis of the aortic arch. There is no aneurysm, dissection or hemodynamically significant stenosis of the visualized portion of the aorta. Conventional 3 vessel aortic branching pattern. The visualized proximal subclavian arteries are widely patent. CAROTID SYSTEMS: There are symmetric, curvilinear enhancement defects within both proximal internal carotid arteries. There is no stenosis of either carotid system. VERTEBRAL ARTERIES: Codominant configuration. Both origins are clearly patent. There is no dissection, occlusion or flow-limiting stenosis to the skull base (V1-V3 segments). CTA HEAD FINDINGS POSTERIOR CIRCULATION: --Vertebral arteries: Normal V4 segments. --Inferior cerebellar arteries: Normal. --Basilar artery: Normal. --Superior cerebellar arteries: Normal. --Posterior cerebral arteries (PCA): Normal. The right PCA is partially supplied by a posterior communicating artery (p-comm). ANTERIOR CIRCULATION: --Intracranial internal carotid arteries: Moderate narrowing of the right ICA at the skull base. Mild bilateral atherosclerotic calcification. --Anterior cerebral arteries (ACA): Normal. Both A1 segments are present. Patent anterior communicating artery (a-comm). --Middle cerebral arteries (MCA): Normal. VENOUS SINUSES: As permitted by contrast timing, patent. ANATOMIC VARIANTS: None Review of the MIP images confirms the above findings. IMPRESSION: 1. No intracranial arterial occlusion or high-grade stenosis. 2. Symmetric, curvilinear enhancement defects within both proximal internal carotid arteries, favored to be artifacts related to flow turbulence. Carotid web is a secondary consideration, but the  appearance is not typical. Dissection or thromboembolism are unlikely given the symmetry. 3. 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 Aortic Atherosclerosis (ICD10-I70.0). Electronically Signed   By: Ulyses Jarred M.D.   On: 04/28/2021 02:30     Labs:   Basic Metabolic Panel: Recent Labs  Lab 04/27/21 2008 04/27/21 2213 04/28/21 0446 04/29/21 0110  NA 138  --  138 138  K 3.6  --  3.6 3.6  CL 105  --  107 110  CO2 22  --  22 21*  GLUCOSE 105*  --  119* 111*  BUN 28*  --  25* 21  CREATININE 1.69*  --  1.69* 1.42*  CALCIUM 9.4  --  8.7* 8.6*  MG  --  2.0  --   --    GFR Estimated Creatinine Clearance: 54.7 mL/min (A) (by C-G formula based on SCr of 1.42 mg/dL (H)). Liver Function Tests: Recent Labs  Lab 04/27/21 2008 04/28/21 0446  AST 63* 40  ALT 36 28  ALKPHOS 77 58  BILITOT 2.0* 1.4*  PROT 7.7 6.2*  ALBUMIN 3.0* 2.5*   No results for input(s): LIPASE, AMYLASE in the last 168 hours. No results for input(s): AMMONIA in the last 168 hours. Coagulation profile Recent Labs  Lab 04/27/21 2213 04/28/21 0446  INR 1.3* 1.5*    CBC: Recent Labs  Lab 04/27/21 2008 04/28/21 0446 04/29/21 0110  WBC 8.6 9.3 10.6*  NEUTROABS 5.8  --   --   HGB 19.2* 16.6 15.5  HCT 55.7* 49.2 45.2  MCV 88.6 89.6 87.3  PLT 205 169 162   Cardiac Enzymes: No results for input(s): CKTOTAL, CKMB, CKMBINDEX, TROPONINI in the last 168 hours. BNP: Invalid input(s): POCBNP CBG: No results for input(s): GLUCAP in the last 168 hours. D-Dimer No results for input(s): DDIMER in the last 72 hours. Hgb A1c Recent Labs    04/28/21 0447  HGBA1C 5.6   Lipid Profile Recent Labs    04/28/21 0446  CHOL 150  HDL 37*  LDLCALC 103*  TRIG 51  CHOLHDL 4.1   Thyroid function studies Recent Labs  04/27/21 2213  TSH 2.994   Anemia work up No results for input(s): VITAMINB12, FOLATE, FERRITIN, TIBC, IRON, RETICCTPCT in the last 72  hours. Microbiology Recent Results (from the past 240 hour(s))  SARS CORONAVIRUS 2 (TAT 6-24 HRS) Nasopharyngeal Nasopharyngeal Swab     Status: None   Collection Time: 04/28/21  4:41 AM   Specimen: Nasopharyngeal Swab  Result Value Ref Range Status   SARS Coronavirus 2 NEGATIVE NEGATIVE Final    Comment: (NOTE) SARS-CoV-2 target nucleic acids are NOT DETECTED.  The SARS-CoV-2 RNA is generally detectable in upper and lower respiratory specimens during the acute phase of infection. Negative results do not preclude SARS-CoV-2 infection, do not rule out co-infections with other pathogens, and should not be used as the sole basis for treatment or other patient management decisions. Negative results must be combined with clinical observations, patient history, and epidemiological information. The expected result is Negative.  Fact Sheet for Patients: SugarRoll.be  Fact Sheet for Healthcare Providers: https://www.woods-mathews.com/  This test is not yet approved or cleared by the [place] FDA and  has been authorized for detection and/or diagnosis of SARS-CoV-2 by FDA under an Emergency Use Authorization (EUA). This EUA will remain  in effect (meaning this test can be used) for the duration of the COVID-19 declaration under Se ction 564(b)(1) of the Act, 21 U.S.C. section 360bbb-3(b)(1), unless the authorization is terminated or revoked sooner.  Performed at [facility], [place] [address] [place], [place] [address]      Discharge Instructions:   Discharge Instructions    Ambulatory referral to Neurology   Complete by: As directed    Follow up with stroke clinic NP (Davi Rotan Vanschaick or Cecille Rubin, if both not available, consider Zachery Dauer, or [place]) at [facility] in about 4 weeks. Thanks.   Diet - low sodium heart healthy   Complete by: As directed    Increase activity slowly   Complete by: As directed      Allergies  as of 04/30/2021   No Known Allergies     Medication List    STOP taking these medications   sildenafil 20 MG tablet Commonly known as: REVATIO   warfarin 7.5 MG tablet Commonly known as: COUMADIN     TAKE these medications   allopurinol 100 MG tablet Commonly known as: ZYLOPRIM Take 100 mg by mouth daily.   aspirin 81 MG EC tablet Take 1 tablet (81 mg total) by mouth daily. Swallow whole. Start taking on: May 01, 2021   atorvastatin 40 MG tablet Commonly known as: LIPITOR Take 1 tablet (40 mg total) by mouth daily. Start taking on: May 01, 2021   carvedilol 25 MG tablet Commonly known as: COREG TAKE 1 TABLET(25 MG) BY MOUTH TWICE DAILY WITH A MEAL What changed: See the new instructions.   losartan 100 MG tablet Commonly known as: COZAAR Take 100 mg by mouth daily.       Follow-up Information    [facility]. Schedule an appointment as soon as possible for a visit in 4 week(s).   Specialty: Neurology Contact information: [address] [place] [phone]               Time coordinating discharge: 11  Signed:  Geradine Girt DO  [facility] 04/30/2021, 2:30 PM

## 2021-05-01 ENCOUNTER — Other Ambulatory Visit: Payer: Self-pay

## 2021-05-01 ENCOUNTER — Telehealth (HOSPITAL_COMMUNITY): Payer: Self-pay | Admitting: Pharmacist Clinician (PhC)/ Clinical Pharmacy Specialist

## 2021-05-01 ENCOUNTER — Inpatient Hospital Stay (HOSPITAL_COMMUNITY)
Admission: RE | Admit: 2021-05-01 | Discharge: 2021-05-19 | DRG: 092 | Disposition: A | Discharge status: Home Health | Payer: Medicare Other | Source: Intra-hospital | Attending: Physical Medicine & Rehabilitation | Admitting: Physical Medicine & Rehabilitation

## 2021-05-01 ENCOUNTER — Encounter (HOSPITAL_COMMUNITY): Payer: Self-pay | Admitting: Physical Medicine & Rehabilitation

## 2021-05-01 DIAGNOSIS — I10 Essential (primary) hypertension: Secondary | ICD-10-CM

## 2021-05-01 DIAGNOSIS — Z79899 Other long term (current) drug therapy: Secondary | ICD-10-CM | POA: Diagnosis not present

## 2021-05-01 DIAGNOSIS — Z9079 Acquired absence of other genital organ(s): Secondary | ICD-10-CM | POA: Diagnosis not present

## 2021-05-01 DIAGNOSIS — I69398 Other sequelae of cerebral infarction: Secondary | ICD-10-CM

## 2021-05-01 DIAGNOSIS — Z86718 Personal history of other venous thrombosis and embolism: Secondary | ICD-10-CM | POA: Diagnosis not present

## 2021-05-01 DIAGNOSIS — R2689 Other abnormalities of gait and mobility: Principal | ICD-10-CM | POA: Diagnosis present

## 2021-05-01 DIAGNOSIS — E860 Dehydration: Secondary | ICD-10-CM | POA: Diagnosis present

## 2021-05-01 DIAGNOSIS — Z7901 Long term (current) use of anticoagulants: Secondary | ICD-10-CM

## 2021-05-01 DIAGNOSIS — Z8546 Personal history of malignant neoplasm of prostate: Secondary | ICD-10-CM

## 2021-05-01 DIAGNOSIS — R7989 Other specified abnormal findings of blood chemistry: Secondary | ICD-10-CM

## 2021-05-01 DIAGNOSIS — I4819 Other persistent atrial fibrillation: Secondary | ICD-10-CM | POA: Diagnosis present

## 2021-05-01 DIAGNOSIS — M109 Gout, unspecified: Secondary | ICD-10-CM | POA: Diagnosis present

## 2021-05-01 DIAGNOSIS — I129 Hypertensive chronic kidney disease with stage 1 through stage 4 chronic kidney disease, or unspecified chronic kidney disease: Secondary | ICD-10-CM | POA: Diagnosis present

## 2021-05-01 DIAGNOSIS — Z8719 Personal history of other diseases of the digestive system: Secondary | ICD-10-CM | POA: Diagnosis not present

## 2021-05-01 DIAGNOSIS — I4891 Unspecified atrial fibrillation: Secondary | ICD-10-CM | POA: Diagnosis not present

## 2021-05-01 DIAGNOSIS — I639 Cerebral infarction, unspecified: Secondary | ICD-10-CM | POA: Diagnosis not present

## 2021-05-01 DIAGNOSIS — D751 Secondary polycythemia: Secondary | ICD-10-CM | POA: Diagnosis present

## 2021-05-01 DIAGNOSIS — I69392 Facial weakness following cerebral infarction: Secondary | ICD-10-CM

## 2021-05-01 DIAGNOSIS — H532 Diplopia: Secondary | ICD-10-CM | POA: Diagnosis present

## 2021-05-01 DIAGNOSIS — Z823 Family history of stroke: Secondary | ICD-10-CM

## 2021-05-01 DIAGNOSIS — N1831 Chronic kidney disease, stage 3a: Secondary | ICD-10-CM | POA: Diagnosis present

## 2021-05-01 DIAGNOSIS — Z8616 Personal history of COVID-19: Secondary | ICD-10-CM | POA: Diagnosis not present

## 2021-05-01 DIAGNOSIS — I4821 Permanent atrial fibrillation: Secondary | ICD-10-CM | POA: Diagnosis not present

## 2021-05-01 DIAGNOSIS — Z800 Family history of malignant neoplasm of digestive organs: Secondary | ICD-10-CM | POA: Diagnosis not present

## 2021-05-01 DIAGNOSIS — H538 Other visual disturbances: Secondary | ICD-10-CM

## 2021-05-01 DIAGNOSIS — I6381 Other cerebral infarction due to occlusion or stenosis of small artery: Secondary | ICD-10-CM | POA: Diagnosis present

## 2021-05-01 LAB — GLUCOSE, CAPILLARY: Glucose-Capillary: 138 mg/dL — ABNORMAL HIGH (ref 70–99)

## 2021-05-01 MED ORDER — TRAZODONE HCL 50 MG PO TABS
25.0000 mg | ORAL_TABLET | Freq: Every evening | ORAL | Status: DC | PRN
  Administered 2021-05-11 – 2021-05-12 (×2): 50 mg via ORAL
  Filled 2021-05-01 (×2): qty 1

## 2021-05-01 MED ORDER — ALLOPURINOL 100 MG PO TABS
100.0000 mg | ORAL_TABLET | Freq: Every day | ORAL | Status: DC
  Administered 2021-05-02 – 2021-05-19 (×18): 100 mg via ORAL
  Filled 2021-05-01 (×18): qty 1

## 2021-05-01 MED ORDER — ATORVASTATIN CALCIUM 40 MG PO TABS
40.0000 mg | ORAL_TABLET | Freq: Every day | ORAL | Status: DC
  Administered 2021-05-02 – 2021-05-18 (×17): 40 mg via ORAL
  Filled 2021-05-01 (×17): qty 1

## 2021-05-01 MED ORDER — GUAIFENESIN-DM 100-10 MG/5ML PO SYRP
5.0000 mL | ORAL_SOLUTION | Freq: Four times a day (QID) | ORAL | Status: DC | PRN
Start: 2021-05-01 — End: 2021-05-19

## 2021-05-01 MED ORDER — FLEET ENEMA 7-19 GM/118ML RE ENEM: 1.0000 | ENEMA | Freq: Once | RECTAL | Status: DC | PRN

## 2021-05-01 MED ORDER — CARVEDILOL 25 MG PO TABS
25.0000 mg | ORAL_TABLET | Freq: Two times a day (BID) | ORAL | Status: DC
  Administered 2021-05-01 – 2021-05-19 (×36): 25 mg via ORAL
  Filled 2021-05-01 (×36): qty 1

## 2021-05-01 MED ORDER — ACETAMINOPHEN 325 MG PO TABS
325.0000 mg | ORAL_TABLET | ORAL | Status: DC | PRN
  Administered 2021-05-06 – 2021-05-08 (×2): 650 mg via ORAL
  Filled 2021-05-01 (×3): qty 2

## 2021-05-01 MED ORDER — LOSARTAN POTASSIUM 50 MG PO TABS
100.0000 mg | ORAL_TABLET | Freq: Every day | ORAL | Status: DC
  Administered 2021-05-02 – 2021-05-19 (×18): 100 mg via ORAL
  Filled 2021-05-01 (×18): qty 2

## 2021-05-01 MED ORDER — DIPHENHYDRAMINE HCL 12.5 MG/5ML PO ELIX: 12.5000 mg | ORAL_SOLUTION | Freq: Four times a day (QID) | ORAL | Status: DC | PRN

## 2021-05-01 MED ORDER — ENOXAPARIN SODIUM 40 MG/0.4ML IJ SOSY
40.0000 mg | PREFILLED_SYRINGE | Freq: Every day | INTRAMUSCULAR | Status: DC
  Administered 2021-05-01 – 2021-05-04 (×4): 40 mg via SUBCUTANEOUS
  Filled 2021-05-01 (×4): qty 0.4

## 2021-05-01 MED ORDER — PROCHLORPERAZINE MALEATE 5 MG PO TABS: 5.0000 mg | ORAL_TABLET | Freq: Four times a day (QID) | ORAL | Status: DC | PRN

## 2021-05-01 MED ORDER — PROCHLORPERAZINE EDISYLATE 10 MG/2ML IJ SOLN: 5.0000 mg | Freq: Four times a day (QID) | INTRAMUSCULAR | Status: DC | PRN

## 2021-05-01 MED ORDER — POLYETHYLENE GLYCOL 3350 17 G PO PACK
17.0000 g | PACK | Freq: Every day | ORAL | Status: DC | PRN
  Filled 2021-05-01 (×2): qty 1

## 2021-05-01 MED ORDER — ALUM & MAG HYDROXIDE-SIMETH 200-200-20 MG/5ML PO SUSP: 30.0000 mL | ORAL | Status: DC | PRN

## 2021-05-01 MED ORDER — PROCHLORPERAZINE 25 MG RE SUPP: 12.5000 mg | Freq: Four times a day (QID) | RECTAL | Status: DC | PRN

## 2021-05-01 MED ORDER — ASPIRIN EC 81 MG PO TBEC
81.0000 mg | DELAYED_RELEASE_TABLET | Freq: Every day | ORAL | Status: DC
  Administered 2021-05-02 – 2021-05-05 (×4): 81 mg via ORAL
  Filled 2021-05-01 (×4): qty 1

## 2021-05-01 MED ORDER — CARVEDILOL 25 MG PO TABS: 25.0000 mg | ORAL_TABLET | Freq: Two times a day (BID) | ORAL | Status: DC

## 2021-05-01 MED ORDER — BISACODYL 10 MG RE SUPP: 10.0000 mg | Freq: Every day | RECTAL | Status: DC | PRN

## 2021-05-01 NOTE — Progress Notes (Signed)
Inpatient Rehabilitation  Patient information reviewed and entered into eRehab system by Tayvion Lauder M. Keairra Bardon, M.A., CCC/SLP, PPS Coordinator.  Information including medical coding, functional ability and quality indicators will be reviewed and updated through discharge.    

## 2021-05-01 NOTE — Progress Notes (Signed)
Occupational Therapy Treatment Patient Details Name: Jordan [name]. 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 Past medical history of atrial fibrillation/atrial flutter on Coumadin, history of DVT, hepatitis C, gout, hypertension, prostate cancer, prior history of substance abuse, current intermittent marijuana use.   OT comments  Pt making steady progress towards OT goals this session. Pt reports his biggest deficit continues to be his vision as pt endorses double vision. Applied occlusion tape to pts own pair of glasses on R nasal portion and provided education on pulling back tape as vision improves. Pt currently requires MIN A +1 for functional transfer with RW. Pt completed seated grooming from chair, pt to retrieve all ADL items from tray table with no issues however pt reports that he often overshoots when navigating his lunch tray. Pt completed seated table top visual tasks including bisecting shapes to screen for inattention ( noted L inattention during task) pt also able to draw clock with slight misalignment of numbers noted. Pt with good awareness into deficits noted from visual tasks with pt appreciative of education. Pt likely to DC to CIR today, will follow acutely per POC.    Follow Up Recommendations  CIR    Equipment Recommendations  3 in 1 bedside commode    Recommendations for Other Services      Precautions / Restrictions Precautions Precautions: Fall Precaution Comments: vision deficits - partial occlusion R nasal visual field Restrictions Weight Bearing Restrictions: No       Mobility Bed Mobility Overal bed mobility: Needs Assistance Bed Mobility: Supine to Sit     Supine to sit: Min guard;HOB elevated     General bed mobility  comments: minguard for safety and line mgmt    Transfers Overall transfer level: Needs assistance Equipment used: Rolling walker (2 wheeled) Transfers: Sit to/from [facility] Sit to Stand: Min guard Stand pivot transfers: Min assist       General transfer comment: min guard to rise from EOB with cues for hand placement, min A to pivot to recliner with cues for RW mgmt and steadying assist during transfer    Balance Overall balance assessment: Needs assistance Sitting-balance support: No upper extremity supported;Feet supported Sitting balance-Leahy Scale: Fair Sitting balance - Comments: static sitting EOB with no UE support and no LOB   Standing balance support: Bilateral upper extremity supported Standing balance-Leahy Scale: Poor Standing balance comment: reliant on BUE support                           ADL either performed or assessed with clinical judgement   ADL Overall ADL's : Needs assistance/impaired     Grooming: Oral care;Sitting;Supervision/safety;Set up Grooming Details (indicate cue type and reason): sitting in recliner, supervision for safety as pt tends to overshoot when reaching for items                 Toilet Transfer: Minimal assistance;RW;Stand-pivot Toilet Transfer Details (indicate cue type and reason): simulated via stand pivot from EOB >recliner         Functional mobility during ADLs: Minimal assistance;Rolling walker General ADL Comments: pt continues to present with visual deficits and decreased activity tolerance     Vision Baseline Vision/History: Wears glasses Wears Glasses: Reading only ((  states he got his glasses from [employer])) Patient Visual Report: Diplopia Depth Perception: Overshoots Additional Comments: pt continues to present with impaired depth perception reporting that when he reaches for items on his tray he can't figure out where they go, applied occlusion tape to pts actual glasses and  had pt complete table top visual tasks such as bisecting shapes in half- noted L inattention. pt able to draw clock correctly but noted to slightly misalign numbers but aware of mistakes.   Perception     Praxis      Cognition Arousal/Alertness: Awake/alert Behavior During Therapy: WFL for tasks assessed/performed;Flat affect Overall Cognitive Status: Impaired/Different from baseline Area of Impairment: Safety/judgement;Following commands;Problem solving;Awareness                       Following Commands: Follows multi-step commands with increased time Safety/Judgement: Decreased awareness of safety Awareness: Emergent Problem Solving: Slow processing General Comments: pt with improved overall awareness able to state mistakes during visual tasks        Exercises     Shoulder Instructions       General Comments taped pts own glasses on R nasal portion    Pertinent Vitals/ Pain       Pain Assessment: Faces Faces Pain Scale: Hurts a little bit Pain Location: R ankle Pain Descriptors / Indicators: Sore Pain Intervention(s): Monitored during session;Repositioned  Home Living                                          Prior Functioning/Environment              Frequency  Min 2X/week        Progress Toward Goals  OT Goals(current goals can now be found in the care plan section)  Progress towards OT goals: Progressing toward goals  Acute Rehab OT Goals Patient Stated Goal: to go back home OT Goal Formulation: With patient Time For Goal Achievement: 05/12/21 Potential to Achieve Goals: Good  Plan Discharge plan remains appropriate;Frequency remains appropriate    Co-evaluation                 AM-PAC OT "6 Clicks" Daily Activity     Outcome Measure   Help from another person eating meals?: None Help from another person taking care of personal grooming?: A Little Help from another person toileting, which includes using  toliet, bedpan, or urinal?: A Little Help from another person bathing (including washing, rinsing, drying)?: A Little Help from another person to put on and taking off regular upper body clothing?: A Little Help from another person to put on and taking off regular lower body clothing?: A Little 6 Click Score: 19    End of Session Equipment Utilized During Treatment: Gait belt;Rolling walker  OT Visit Diagnosis: Unsteadiness on feet (R26.81);Other abnormalities of gait and mobility (R26.89);Low vision, both eyes (H54.2);Apraxia (R48.2);Other symptoms and signs involving cognitive function;Muscle weakness (generalized) (M62.81)   Activity Tolerance Patient tolerated treatment well   Patient Left in chair;with call bell/phone within reach;with chair alarm set   Nurse Communication Mobility status        Time: 9518-8416 OT Time Calculation (min): 32 min  Charges: OT General Charges $OT Visit: 1 Visit OT Treatments $Self Care/Home Management : 8-22 mins $Therapeutic Activity: 8-22 mins  Harley Alto., COTA/L Acute Rehabilitation Services [phone]  [facility] 05/01/2021, 4:22 PM

## 2021-05-01 NOTE — Progress Notes (Addendum)
PMR Admission Coordinator Pre-Admission Assessment  Patient: Jordan [name]. is an 75 y.o., male MRN: [id number] DOB: 15-Jul-1946 Height: 6' (182.9 cm) Weight: 95.7 kg                                                                                                                                                  Insurance Information HMO:     PPO: yes     PCP:      IPA:      80/20:      OTHER:  PRIMARY: [employer] [place]/Medicare plan      Policy#: [id number]      Subscriber: pt CM Name: Jordan [name]     Phone#:  [phone]   Fax#: [phone] Approved starting 5/6 by Jordan [name] for 7 days, next review date: [id number] Pre-Cert#: [id number]     Employer:  Benefits:  Phone #: [phone]     Name: 5/4 Eff. Date: 12/27/2020     Deduct: $500      Out of Pocket Max: $1800      Life Max: none  CIR: 80%      SNF: 80% Outpatient: 100%     Co-Pay: visits per medical neccesity Home Health: 100%      Co-Pay: visits per medical neccesity DME: 100%     Co-Pay: no Auth required Providers: in network  SECONDARY: none        Financial Counselor:       Phone#:   [employer]" for patients in Inpatient Rehabilitation Facilities with attached "Privacy Act [place] Records" was provided and verbally reviewed with: Patient  Emergency Contact Information Contact Information     Name Relation Home Work Mobile   [place] Sister   [phone]      Current Medical History  Patient Admitting Diagnosis: CVA  History of Present Illness: 75 y.o.malewith history of Hep C, prostate cancer, gout, 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 UDS negative and INR- 1.3 at admission. CT head showed subacute anterior right thalamic infarctand CTA head negative for LVO but showed enhancement defects proximal ICA bilaterally. MRI/MRA brain showed subacute infarctin ventral and  medial right thalamus with small amount of petechial hemorrhage. Echocardiogram with EF of 45-50%withseverebi atrial dilatation and mild AVR. Carotid dopplers were negative for significant ICA stenosis. Dr. Erlinda Hong felt that stroke likely due to small vessel disease v/s subtherapeutic INR and recommended ASA with repeat CT head 5-7 days and transition toDOAC.Therapy evaluations completed yesterday revealing unsteady gait withleft lean, elevated BP with activity as well as diplopiaandgait abnormalityaffecting ADLs.   Complete NIHSS TOTAL: 3 Glasgow Coma Scale Score: 15  Past Medical History      Past Medical History:  Diagnosis Date  . Atrial fib/flutter, transient  Hx  . Atrial fibrillation (HCC)    coumadin  . Colon cancer [facility])    family hx  . Diverticulosis   . Gout   . Hemorrhoids   . Hepatitis C   . History of DVT (deep vein thrombosis)   . History of nuclear stress test 04/2002   exercise; normal study   . Hypertension   . 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[place])    history of   . Personal history of colonic polyps   . Prostate cancer [facility])    radical prostatectomy     Family History  family history includes Brain cancer in his mother; Colon cancer in his mother; Stroke in his father.  Prior Rehab/Hospitalizations:  Has the patient had prior rehab or hospitalizations prior to admission? Yes  Has the patient had major surgery during 100 days prior to admission? No  Current Medications   Current Facility-Administered Medications:  .  acetaminophen (TYLENOL) tablet 650 mg, 650 mg, Oral, Q4H PRN **OR** acetaminophen (TYLENOL) 160 MG/5ML solution 650 mg, 650 mg, Per Tube, Q4H PRN **OR** acetaminophen (TYLENOL) suppository 650 mg, 650 mg, Rectal, Q4H PRN, Jordan Finders R, MD .  allopurinol (ZYLOPRIM) tablet 100 mg, 100 mg, Oral, Daily, Jordan Finders R, MD, 100 mg at 05/01/21 5366 .  aspirin EC tablet 81 mg, 81 mg, Oral, Daily, Jordan Finders R, MD, 81 mg at 05/01/21 4403 .  atorvastatin (LIPITOR) tablet 40 mg, 40 mg, Oral, Daily, Rosalin Hawking, MD, 40 mg at 05/01/21 4742 .  carvedilol (COREG) tablet 25 mg, 25 mg, Oral, BID WC, Patel, Vishal R, MD, 25 mg at 05/01/21 5956 .  heparin injection 5,000 Units, 5,000 Units, Subcutaneous, Q8H, Jordan Finders R, MD, 5,000 Units at 05/01/21 1422 .  hydrALAZINE (APRESOLINE) tablet 25 mg, 25 mg, Oral, Q6H PRN, Lenore Cordia, MD, 25 mg at 04/28/21 0240 .  losartan (COZAAR) tablet 100 mg, 100 mg, Oral, Daily, Eulogio Bear U, DO, 100 mg at 05/01/21 3875 .  metoprolol tartrate (LOPRESSOR) injection 5 mg, 5 mg, Intravenous, Q4H PRN, Posey Pronto, Vishal R, MD .  senna-docusate (Senokot-S) tablet 1 tablet, 1 tablet, Oral, QHS PRN, Lenore Cordia, MD  Patients Current Diet:  Diet Order             Diet - low sodium heart healthy           Diet Heart Room service appropriate? Yes; Fluid consistency: Thin  Diet effective now                   Precautions / Restrictions Precautions Precautions: Fall Precaution Comments: vision deficits - partial occlusion [name] nasal visual field Restrictions Weight Bearing Restrictions: No   Has the patient had 2 or more falls or a fall with injury in the past year?No  Prior Activity Level Community (5-7x/wk): independent, driving, retired from [employer], Geophysicist/field seismologist  Prior Functional Level Prior Function Level of Independence: Independent with assistive device(s) Comments: uses a crutch when he has a gout attack. drives; pays his own bills; has intentions of working out  Self Care: Did the patient need help bathing, dressing, using the toilet or eating?  Independent  Indoor Mobility: Did the patient need assistance with walking from room to room (with or without device)? Independent  Stairs: Did the patient need assistance with internal or external stairs (with or without device)? Independent  Functional Cognition: Did the  patient need help planning regular tasks such as shopping or remembering to take medications? Independent  Home Assistive Devices / Equipment Home Assistive Devices/Equipment: None Home Equipment: Crutches  Prior Device Use: Indicate devices/aids used by the patient prior to current illness, exacerbation or injury?  crutch with gout flares  Current Functional Level Cognition  Arousal/Alertness: Awake/alert Overall Cognitive Status: Impaired/Different from baseline Current Attention Level: Selective Orientation Level: Oriented X4 Following Commands: Follows multi-step commands with increased time Safety/Judgement: Decreased awareness of safety General Comments: mildly inattentive to L, cues to correct. Pt fearful of falling throughout standing activity. Attention: Alternating,Selective Selective Attention: Impaired Selective Attention Impairment: Verbal complex,Functional complex Alternating Attention: Impaired Alternating Attention Impairment: Verbal complex,Functional complex Memory: Impaired Memory Impairment: Decreased recall of new information,Decreased short term memory Awareness: Impaired Problem Solving: Impaired Problem Solving Impairment: Verbal complex,Functional complex Executive Function: Organizing,Self Monitoring Organizing: Impaired Organizing Impairment: Verbal complex,Functional complex Safety/Judgment: Impaired    Extremity Assessment (includes Sensation/Coordination)  Upper Extremity Assessment: Defer to OT evaluation LUE Deficits / Details: apparent sensory motor deficits/clumsy hand; LUE drift present; poor finger to nose LUE Coordination: decreased fine motor,decreased gross motor  Lower Extremity Assessment: LLE deficits/detail LLE Deficits / Details: Decreased coordination noted during heel to shin test and difficulty taking steps. Functional weakness noted.    ADLs  Overall ADL's : Needs assistance/impaired Eating/Feeding: Modified  independent Grooming: Set up,Supervision/safety,Sitting Upper Body Bathing: Set up,Supervision/ safety,Sitting Lower Body Bathing: Moderate assistance,Sit to/from stand Upper Body Dressing : Minimal assistance,Sitting Lower Body Dressing: Moderate assistance,Sit to/from stand Toilet Transfer: Moderate assistance,RW,Ambulation Armed forces technical officer Details (indicate cue type and reason): Min A with ambulating to bathroom however when turning requires at least mod A to prevent fall @ RW level; L bias Toileting- Clothing Manipulation and Hygiene: Maximal assistance Functional mobility during ADLs: Moderate assistance,Rolling walker,Cueing for safety,Cueing for sequencing General ADL Comments: States LB ADL tasks have become more difficlt to copmlete. Increased SOB with minimal activity    Mobility  Overal bed mobility: Needs Assistance Bed Mobility: Supine to Sit Supine to sit: Min assist Sit to supine: Min assist General bed mobility comments: min assist to progress LLE to EOB, scoot forward.    Transfers  Overall transfer level: Needs assistance Equipment used: Rolling walker (2 wheeled) Transfers: Sit to/from Stand Sit to Stand: Min assist General transfer comment: min assist for power up, rise, and steady. verbal cuing for hand placement when rising/sitting each time. STS as intervention, x5 throughout session    Ambulation / Gait / Stairs / Wheelchair Mobility  Ambulation/Gait Ambulation/Gait assistance: Herbalist (Feet): 30 Feet (x2) Assistive device: Rolling walker (2 wheeled) Gait Pattern/deviations: Step-through pattern,Decreased stride length,Trunk flexed General Gait Details: min assist to steady, guide pt and RW during direction change. VC for placement in RW, upright posture, increasing step length. Gait velocity: decr    Posture / Balance Dynamic Sitting Balance Sitting balance - Comments: L lateral lean and required BUE and external  support. Balance Overall balance assessment: Needs assistance Sitting-balance support: Bilateral upper extremity supported,Feet supported Sitting balance-Leahy Scale: Fair Sitting balance - Comments: L lateral lean and required BUE and external support. Postural control: Left lateral lean Standing balance support: Bilateral upper extremity supported Standing balance-Leahy Scale: Poor Standing balance comment: L lateral lean noted. Required external support to maintain balance.    Special needs/care consideration On coumadin pta , but INR subtherapeutic   Previous Home Environment  Living Arrangements: Alone  Lives With: Alone Available Help at Discharge: Family,Available PRN/intermittently Type of Home: Apartment Home Layout: One level Home Access: Level entry Bathroom Shower/Tub: Tub/shower unit,Curtain [employer]  Toilet: Standard Bathroom Accessibility: Yes How Accessible: Accessible via walker Home Care Services: No  Discharge Living Setting Plans for Discharge Living Setting: Patient's home,Apartment,Alone Type of Home at Discharge: Apartment Discharge Home Layout: One level Discharge Home Access: Level entry Discharge Bathroom Shower/Tub: Tub/shower unit Discharge Bathroom Toilet: Standard Discharge Bathroom Accessibility: Yes How Accessible: Accessible via walker Does the patient have any problems obtaining your medications?: No  Social/Family/Support Systems Contact Information: Pamala Hurry , sister Anticipated Caregiver: family intermittently Anticipated Caregiver's Contact Information: see above Ability/Limitations of Caregiver: prn only Caregiver Availability: Intermittent Discharge Plan Discussed with Primary Caregiver: Yes Is Caregiver In Agreement with Plan?: Yes Does Caregiver/Family have Issues with Lodging/Transportation while Pt is in Rehab?: No  Goals Patient/Family Goal for Rehab: mod I ot supervision with PT and OT Expected length of stay: ELOS 9-13  days Pt/Family Agrees to Admission and willing to participate: Yes Program Orientation Provided & Reviewed with Pt/Caregiver Including Roles  & Responsibilities: Yes  Decrease burden of Care through IP rehab admission: n/a  Possible need for SNF placement upon discharge:not anticipated  Patient Condition: This patient's condition remains as documented in the consult dated 04/29/2021, in which the Rehabilitation Physician determined and documented that the patient's condition is appropriate for intensive rehabilitative care in an inpatient rehabilitation facility. Will admit to inpatient rehab today.  Preadmission Screen Completed By: Danne Baxter RN MSN with updates by  Genella Mech, CCC-SLP, 05/01/2021 3:25 PM ______________________________________________________________________   Discussed status with Dr. Posey Pronto on 05/01/21 at 93 and received approval for admission today.

## 2021-05-01 NOTE — Progress Notes (Signed)
PT Cancellation Note  Patient Details Name: Jordan [name]. MRN: [id number] DOB: [birth year]   Cancelled Treatment:    Reason Eval/Treat Not Completed: Patient declined, no reason specified. Multiple attempts. Pt declining OOB or participation in therapy, just stating "I'm pretty comfortable where I am right now." RN reports pt has been declining nursing attempts for OOB as well. PT to re-attempt as time allows.   Lorriane Shire 05/01/2021, 12:18 PM   Lorrin Goodell, PT  Office # [phone] Pager [phone]

## 2021-05-01 NOTE — Progress Notes (Signed)
Inpatient Rehab Admissions Coordinator:   I have insurance auth for CIR and will plan to admit today. RN may call report to [phone].  Clemens Catholic, [place], [place] Admissions Coordinator  [phone] ([place]) [phone] (office)

## 2021-05-01 NOTE — Telephone Encounter (Signed)
Error

## 2021-05-01 NOTE — H&P (Addendum)
Physical Medicine and Rehabilitation Admission H&P    Chief Complaint  Patient presents with  . Stroke with functional deficits.     HPI: Jordan [name]. [name] is a 75 year old male with history of HTN, A fib/DVT- on coumadin, Hep C.  non-obstructive HCM, substance abuse; was admitted on 04/27/2021 with weakness and dizziness times a few days.  History taken from chart review and patient. UDS was negative and INR 1.3/subtherapeutic at admission.  CT head showed subacute anterior right thalamic infarct.  CTA head was negative for LVO but showed enhancement defects proximal ICA bilaterally.  MRI/MRI brain showed subacute infarct in ventral and medial right thalamus with small amount of petechial hemorrhage.  Echocardiogram with EF of 45-50% with severe biatrial dilatation and mild AVR.  Carotid Dopplers were negative for significant ICA stenosis.   Dr. Erlinda Hong felt that stroke was likely due to small vessel disease versus subtherapeutic INR and recommended starting aspirin with repeat CT in 5-7 days and transition to [place] if stable. He was also noted to have CKD with SCr 1.69 and polycythemia with Hgb 19.2--> improving with hydration. dehydration.  Elevated BNP noted without signs of volume overload. Patient continues to be limited by diplopia with balance deficits and gait disorder.  CIR was recommended due to functional decline. Please see preadmission assessment from earlier today as well.   Review of Systems  Constitutional: Negative for chills and fever.  HENT: Negative for hearing loss and tinnitus.   Eyes: Positive for blurred vision and double vision.  Respiratory: Negative for cough and shortness of breath.   Cardiovascular: Positive for leg swelling. Negative for chest pain and palpitations.  Gastrointestinal: Negative for constipation, heartburn and nausea.  Genitourinary: Negative for dysuria and urgency.  Musculoskeletal: Negative for back pain and myalgias.  Skin: Negative for rash.   Neurological: Positive for dizziness and weakness. Negative for headaches.  Psychiatric/Behavioral: The patient is not nervous/anxious and does not have insomnia (has been sleeping too much).   All other systems reviewed and are negative.   Past Medical History:  Diagnosis Date  . Atrial fib/flutter, transient    Hx  . Atrial fibrillation (HCC)    coumadin  . Colon cancer [facility])    family hx  . Diverticulosis   . Gout   . Hemorrhoids   . Hepatitis C   . History of DVT (deep vein thrombosis)   . History of nuclear stress test 04/2002   exercise; normal study   . Hypertension   . 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[place])    history of   . Personal history of colonic polyps   . Prostate cancer [facility])    radical prostatectomy     Past Surgical History:  Procedure Laterality Date  . CARDIAC CATHETERIZATION  2007   no occlusive CAD  . COLONOSCOPY  [id number]   [name]  . PROSTATECTOMY    . PROSTATECTOMY  04-04   Dr. Risa Grill  . TRANSTHORACIC ECHOCARDIOGRAM  05/2006   EF ~29%; LV systolic function normal; mild focal basal septal hypertrophy; AV thickness mildly increased; LA mod-markedly dilate    Family History  Problem Relation Age of Onset  . Brain cancer Mother        brain tumor  . Colon cancer Mother   . Stroke Father   . Rectal cancer Neg Hx   . Stomach cancer Neg Hx   . Esophageal cancer Neg Hx     Social History: Lives alone and independent PTA. He  was active cyclist prior to Covid--now does upright bike 30 minutes/day. He  reports that he has never smoked. He has never used smokeless tobacco. He reports current alcohol use--couple of glasses of wine twice a week. He has used marijuana--none in the past month.    Allergies: No Known Allergies    Medications Prior to Admission  Medication Sig Dispense Refill  . allopurinol (ZYLOPRIM) 100 MG tablet Take 100 mg by mouth daily.    . carvedilol (COREG) 25 MG tablet TAKE 1 TABLET(25 MG) BY MOUTH TWICE  DAILY WITH A MEAL (Patient taking differently: Take 25 mg by mouth 2 (two) times daily with a meal.) 60 tablet 7  . losartan (COZAAR) 100 MG tablet Take 100 mg by mouth daily.    . sildenafil (REVATIO) 20 MG tablet Take 1 to 5 pills as needed 50 tablet 2  . warfarin (COUMADIN) 7.5 MG tablet TAKE 1 TABLET BY MOUTH DAILY AS DIRECTED BY COUMADIN CLINIC (Patient taking differently: Take 3.75-7.5 mg by mouth daily. 3.75 mg Monday and Friday. 7.5 mg Tuesday,Wednesday,Thursday,Saturday and Sunday.) 60 tablet 1    Drug Regimen Review  Drug regimen was reviewed and remains appropriate with no significant issues identified  Home: Home Living Family/patient expects to be discharged to:: Private residence Living Arrangements: Alone Available Help at Discharge: Family,Available PRN/intermittently Type of Home: Apartment Home Access: Level entry Home Layout: One level Bathroom Shower/Tub: Tub/shower unit,Curtain Biochemist, clinical: Standard Bathroom Accessibility: Yes Home Equipment: Crutches  Lives With: Alone   Functional History: Prior Function Level of Independence: Independent with assistive device(s) Comments: uses a crutch when he has a gout attack. drives; pays his own bills; has intentions of working out  Functional Status:  Mobility: Bed Mobility Overal bed mobility: Needs Assistance Bed Mobility: Supine to Sit Supine to sit: Min assist Sit to supine: Min assist General bed mobility comments: min assist to progress LLE to EOB, scoot forward. Transfers Overall transfer level: Needs assistance Equipment used: Rolling walker (2 wheeled) Transfers: Sit to/from Stand Sit to Stand: Min assist General transfer comment: min assist for power up, rise, and steady. verbal cuing for hand placement when rising/sitting each time. STS as intervention, x5 throughout session Ambulation/Gait Ambulation/Gait assistance: Min assist Gait Distance (Feet): 30 Feet (x2) Assistive device: Rolling walker  (2 wheeled) Gait Pattern/deviations: Step-through pattern,Decreased stride length,Trunk flexed General Gait Details: min assist to steady, guide pt and RW during direction change. VC for placement in RW, upright posture, increasing step length. Gait velocity: decr    ADL: ADL Overall ADL's : Needs assistance/impaired Eating/Feeding: Modified independent Grooming: Set up,Supervision/safety,Sitting Upper Body Bathing: Set up,Supervision/ safety,Sitting Lower Body Bathing: Moderate assistance,Sit to/from stand Upper Body Dressing : Minimal assistance,Sitting Lower Body Dressing: Moderate assistance,Sit to/from stand Toilet Transfer: Moderate assistance,RW,Ambulation Armed forces technical officer Details (indicate cue type and reason): Min A with ambulating to bathroom however when turning requires at least mod A to prevent fall @ RW level; L bias Toileting- Clothing Manipulation and Hygiene: Maximal assistance Functional mobility during ADLs: Moderate assistance,Rolling walker,Cueing for safety,Cueing for sequencing General ADL Comments: States LB ADL tasks have become more difficlt to copmlete. Increased SOB with minimal activity  Cognition: Cognition Overall Cognitive Status: Impaired/Different from baseline Arousal/Alertness: Awake/alert Orientation Level: Oriented X4 Attention: Alternating,Selective Selective Attention: Impaired Selective Attention Impairment: Verbal complex,Functional complex Alternating Attention: Impaired Alternating Attention Impairment: Verbal complex,Functional complex Memory: Impaired Memory Impairment: Decreased recall of new information,Decreased short term memory Awareness: Impaired Problem Solving: Impaired Problem Solving Impairment: Verbal complex,Functional complex Executive  Function: Organizing,Self Monitoring Organizing: Impaired Organizing Impairment: Verbal complex,Functional complex Safety/Judgment: Impaired Cognition Arousal/Alertness:  Awake/alert Behavior During Therapy: WFL for tasks assessed/performed,Anxious Overall Cognitive Status: Impaired/Different from baseline Area of Impairment: Safety/judgement,Following commands,Problem solving Current Attention Level: Selective Memory: Decreased short-term memory Following Commands: Follows multi-step commands with increased time Safety/Judgement: Decreased awareness of safety Awareness: Emergent Problem Solving: Decreased initiation,Difficulty sequencing,Requires verbal cues,Requires tactile cues General Comments: mildly inattentive to L, cues to correct. Pt fearful of falling throughout standing activity.   Blood pressure (!) 140/95, pulse (!) 55, temperature 98.2 F (36.8 C), temperature source Oral, resp. rate 17, height 6' (1.829 m), weight 95.7 kg, SpO2 99 %. Physical Exam Vitals and nursing note reviewed.  Constitutional:      General: He is not in acute distress.    Appearance: Normal appearance.  HENT:     Head: Normocephalic and atraumatic.     Right Ear: External ear normal.     Left Ear: External ear normal.     Mouth/Throat:     Mouth: Mucous membranes are dry.  Eyes:     General:        Right eye: No discharge.        Left eye: No discharge.     Extraocular Movements: Extraocular movements intact.  Cardiovascular:     Comments: Irregularly irregular Pulmonary:     Effort: Pulmonary effort is normal. No respiratory distress.     Breath sounds: No stridor.  Abdominal:     General: Abdomen is flat. Bowel sounds are normal. There is no distension.  Musculoskeletal:        General: Swelling present.     Cervical back: Normal range of motion and neck supple.     Comments: 2+ pedal edema left ankle/distal tibia (gout flare 2 weeks ago). 1+ right pedal edema. No tenderness in extremities   Skin:    General: Skin is warm and dry.  Neurological:     Mental Status: He is alert and oriented to person, place, and time.     Comments: Alert. Mild left  facial weakness without dysarthria. Dysconjugate gaze. Diplopia improving.  Motor: RUE/RLE: 5/5 proximal distal LUE/LLE: 4+/5 proximal to distal Bilateral upper extremity dysmetria, improving  Psychiatric:        Mood and Affect: Mood normal.        Behavior: Behavior normal.        Thought Content: Thought content normal.    No results found for this or any previous visit (from the past 48 hour(s)). No results found.   Medical Problem List and Plan: 1.  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  -patient may shower  -ELOS/Goals: 9-13 days/Supervision/Mod I  Admit to CIR 2.  Antithrombotics: -DVT/anticoagulation:  Pharmaceutical: Lovenox  -antiplatelet therapy: ASA for a week-->repeat CT head on Monday 3. Pain Management: N/A 4. Mood: LCSW to follow for evaluation and support.   -antipsychotic agents: N/a 5. Neuropsych: This patient is capable of making decisions on  own behalf. 6. Skin/Wound Care: Routine pressure-relief measures. 7. Fluids/Electrolytes/Nutrition: Monitor intake/output.  Check labs in AM. 8.  HTN: Monitor blood pressures twice daily. Continue Losartan and coreg.   Monitor with increased mobility 9.  Mild elevation in white count: Monitor for fevers and other signs of infection.    CBC ordered for tomorrow 10.  CKD: SCR- 1.69 at admission and trending to baseline?  CMP ordered for tomorrow 11.  Gout hx: Continue allopurinol  Monitor for flares 12. Afib  Monitor with increased exertion  DOAC next week if CT stable  Bary Leriche, PA-C 05/01/2021  I have personally performed a face to face diagnostic evaluation, including, but not limited to relevant history and physical exam findings, of this patient and developed relevant assessment and plan.  Additionally, I have reviewed and concur with the physician assistant's documentation  above.  Delice Lesch, MD, ABPMR  The patient's status has not changed. Any changes from the pre-admission screening or documentation from the acute chart are noted above.   Delice Lesch, MD, ABPMR

## 2021-05-01 NOTE — Progress Notes (Signed)
Patient arrived on rehab unit. Verbalized willingness to call for all needs. Oriented to unit routine. No questions or concerns, resting with call bell in place.

## 2021-05-01 NOTE — IPOC Note (Signed)
Individualized overall Plan of Care (IPOC) Patient Details Name: Jordan [name]. MRN: [id number] DOB: 1946-07-08  Admitting Diagnosis: Thalamic stroke [facility])  Hospital Problems: Principal Problem:   Thalamic stroke [facility]) Active Problems:   Atrial fibrillation ([place])   Stage 3a chronic kidney disease ([place])   Benign essential HTN     Functional Problem List: Nursing Edema,Endurance,Medication Management  PT Balance,Edema,Endurance,Motor,Pain,Perception,Safety  OT Balance,Cognition,Edema,Endurance,Motor,Pain,Perception,Safety,Vision  SLP Cognition,Safety  TR         Basic ADL's: OT Eating     Advanced  ADL's: OT Simple Meal Preparation     Transfers: PT Bed Mobility,Bed to Chair,Car,Furniture  OT Toilet,Tub/Shower     Locomotion: PT Ambulation,Stairs     Additional Impairments: OT None  SLP None      TR      Anticipated Outcomes Item Anticipated Outcome  Self Feeding Mod I  Swallowing  N/A   Basic self-care  Supervision  Toileting  Supervision   Bathroom Transfers Supervision  Bowel/Bladder  remain continent and maintain regular emptying  Transfers  supervision  Locomotion  supervision  Communication  N/A  Cognition  mod I mild complex problem solving/safety/awareness  Pain  no pain  Safety/Judgment  no infection, fall skin breakdown   Therapy Plan: PT Intensity: Minimum of 1-2 x/day ,45 to 90 minutes PT Frequency: 5 out of 7 days PT Duration Estimated Length of Stay: 17-21days OT Intensity: Minimum of 1-2 x/day, 45 to 90 minutes OT Frequency: 5 out of 7 days OT Duration/Estimated Length of Stay: 2 to 2.5 weeks SLP Intensity: Minumum of 1-2 x/day, 30 to 90 minutes SLP Frequency: 3 to 5 out of 7 days SLP Duration/Estimated Length of Stay: 2.5 weeks    Team Interventions: Nursing Interventions Patient/Family Education,Pain Management  PT interventions Ambulation/gait training,DME/adaptive equipment instruction,Neuromuscular  re-education,Psychosocial support,Stair training,UE/LE Strength taining/ROM,Wheelchair propulsion/positioning,Balance/vestibular training,Discharge planning,Pain management,Therapeutic Activities,UE/LE Coordination activities,Cognitive remediation/compensation,Disease management/prevention,Functional mobility training,Patient/family education,Therapeutic Exercise,Visual/perceptual remediation/compensation  OT Interventions Balance/vestibular training,DME/adaptive equipment instruction,Patient/family education,Therapeutic Activities,Cognitive remediation/compensation,Psychosocial support,Therapeutic Exercise,Self Care/advanced ADL retraining,Functional mobility training,Community reintegration,UE/LE Strength taining/ROM,Discharge planning,Neuromuscular re-education,UE/LE Coordination activities,Disease mangement/prevention,Pain management,Visual/perceptual remediation/compensation  SLP Interventions Cognitive remediation/compensation,Cueing hierarchy,Medication managment,Patient/family education,Functional tasks  TR Interventions    SW/CM Interventions Discharge Planning,Psychosocial Support,Patient/Family Education   Barriers to Discharge MD  Medical stability and Weight  Nursing Decreased caregiver support    PT Decreased caregiver support L inattention, cognitive/safety awareness  OT Lack of/limited family support    SLP Decreased caregiver support patient lives by himself  SW Decreased caregiver support Does not have 24/7 care   Team Discharge Planning: Destination: PT-Home ,OT- Home , SLP-Home Projected Follow-up: PT-Home health PT,24 hour supervision/assistance, OT-  Home health OT, SLP-Other (comment) (TBD pending progress) Projected Equipment Needs: PT-To be determined, OT- To be determined, SLP-None recommended by SLP Equipment Details: PT-uses crutches when gout flares, OT-  Patient/family involved in discharge planning: PT- Patient,  OT-Patient, SLP-Patient  MD ELOS: 14-17  days. Medical Rehab Prognosis:  Good Assessment: 75 year old male with history of HTN, A fib/DVT- on coumadin, Hep C.  non-obstructive HCM, substance abuse; was admitted on 04/27/2021 with weakness and dizziness times a few days.  UDS was negative and INR 1.3/subtherapeutic at admission.  CT head showed subacute anterior right thalamic infarct.  CTA head was negative for LVO but showed enhancement defects proximal ICA bilaterally.  MRI/MRI brain showed subacute infarct in ventral and medial right thalamus with small amount of petechial hemorrhage.  Echocardiogram with EF of 45-50% with severe biatrial dilatation and mild AVR.  Carotid Dopplers  were negative for significant ICA stenosis.   Dr. Erlinda Hong felt that stroke was likely due to small vessel disease versus subtherapeutic INR and recommended starting aspirin with repeat CT in 5-7 days and transition to [place] if stable. He was also noted to have CKD and polycythemia, improving with hydration.  Elevated BNP noted without signs of volume overload. Patient continues to be limited by diplopia with balance deficits and gait disorder. Will set goals for Supervision with PT/OT and Mod I with SLP.   Due to the current state of emergency, patients may not be receiving their 3-hours of Medicare-mandated therapy.  See Team Conference Notes for weekly updates to the plan of care

## 2021-05-01 NOTE — Progress Notes (Signed)
Continue to await CIR. Eulogio Bear DO

## 2021-05-01 NOTE — Progress Notes (Signed)
RN offered to assist pt OOB in chair, pt declined 3 times. Denied any distress at this time. Patient with order to dc to CIR, waiting on insurance auth. Will cont to monitor.

## 2021-05-01 NOTE — Care Management Important Message (Signed)
Important Message  Patient Details  Name: Jordan [name]. MRN: [id number] Date of Birth: 1946-04-18   Medicare Important Message Given:  Yes - Important Message mailed due to current National Emergency  Verbal consent obtained due to current National Emergency  Relationship to patient: Self Contact Name: [employer] Call Date: 05/01/21  Time: 2111 Phone: [phone] Outcome: No Answer/Busy Important Message mailed to: Patient address on file     [name] 05/01/2021, 9:59 AM

## 2021-05-01 NOTE — Progress Notes (Signed)
Inpatient Rehab Admissions Coordinator:     I not not yet have insurance auth for CIR for this pt. But will follow for potential admit if insurance approves.   Clemens Catholic, [place], [place] Admissions Coordinator  [phone] ([place]) [phone] (office)

## 2021-05-01 NOTE — H&P (Signed)
Physical Medicine and Rehabilitation Admission H&P    Chief Complaint  Patient presents with  . Stroke with functional deficits.     HPI: Jordan [name]. Baley is a 75 year old male with history of HTN, A fib/DVT- on coumadin, Hep C.  non-obstructive HCM, substance abuse; was admitted on 04/27/2021 with weakness and dizziness times a few days.  UDS was negative and INR 1.3/subtherapeutic at admission.  CT head showed subacute anterior right thalamic infarct.  CTA head was negative for LVO but showed enhancement defects proximal ICA bilaterally.  MRI/MRI brain showed subacute infarct in ventral and medial right thalamus with small amount of petechial hemorrhage.  Echocardiogram with EF of 45-50% with severe biatrial dilatation and mild AVR.  Carotid Dopplers were negative for significant ICA stenosis.   Dr. Erlinda Hong felt that stroke was likely due to small vessel disease versus subtherapeutic INR and recommended starting aspirin with repeat CT in 5-7 days and transition to [place] if stable. He was also noted to have CKD with SCr 1.69 and polycythemia with Hgb 19.2--> improving with hydration. dehydration.  Elevated BNP noted without signs of volume overload. Patient continues to be limited by diplopia with balance deficits and gait disorder.  CIR was recommended due to functional decline. Please see preadmission assessment from earlier today as well.   Review of Systems  Constitutional: Negative for chills and fever.  HENT: Negative for hearing loss and tinnitus.   Eyes: Positive for blurred vision and double vision.  Respiratory: Negative for cough and shortness of breath.   Cardiovascular: Positive for leg swelling. Negative for chest pain and palpitations.  Gastrointestinal: Negative for constipation, heartburn and nausea.  Genitourinary: Negative for dysuria and urgency.  Musculoskeletal: Negative for back pain and myalgias.  Skin: Negative for rash.  Neurological: Positive for dizziness and  weakness. Negative for headaches.  Psychiatric/Behavioral: The patient is not nervous/anxious and does not have insomnia (has been sleeping too much).   All other systems reviewed and are negative.   Past Medical History:  Diagnosis Date  . Atrial fib/flutter, transient    Hx  . Atrial fibrillation (HCC)    coumadin  . Colon cancer [facility])    family hx  . Diverticulosis   . Gout   . Hemorrhoids   . Hepatitis C   . History of DVT (deep vein thrombosis)   . History of nuclear stress test 04/2002   exercise; normal study   . Hypertension   . 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[place])    history of   . Personal history of colonic polyps   . Prostate cancer [facility])    radical prostatectomy     Past Surgical History:  Procedure Laterality Date  . CARDIAC CATHETERIZATION  2007   no occlusive CAD  . COLONOSCOPY  [id number]   [name]  . PROSTATECTOMY    . PROSTATECTOMY  04-04   Dr. Risa Grill  . TRANSTHORACIC ECHOCARDIOGRAM  05/2006   EF ~97%; LV systolic function normal; mild focal basal septal hypertrophy; AV thickness mildly increased; LA mod-markedly dilate    Family History  Problem Relation Age of Onset  . Brain cancer Mother        brain tumor  . Colon cancer Mother   . Stroke Father   . Rectal cancer Neg Hx   . Stomach cancer Neg Hx   . Esophageal cancer Neg Hx     Social History: Lives alone and independent PTA. 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 He  reports that he has never smoked. He has never used smokeless tobacco. He reports current alcohol use--couple of glasses of wine twice a week. He has used marijuana--none in the past month.    Allergies: No Known Allergies    Medications Prior to Admission  Medication Sig Dispense Refill  . allopurinol (ZYLOPRIM) 100 MG tablet Take 100 mg by mouth daily.    . carvedilol (COREG) 25 MG tablet TAKE 1 TABLET(25 MG) BY MOUTH TWICE DAILY WITH A MEAL (Patient taking  differently: Take 25 mg by mouth 2 (two) times daily with a meal.) 60 tablet 7  . losartan (COZAAR) 100 MG tablet Take 100 mg by mouth daily.    . sildenafil (REVATIO) 20 MG tablet Take 1 to 5 pills as needed 50 tablet 2  . warfarin (COUMADIN) 7.5 MG tablet TAKE 1 TABLET BY MOUTH DAILY AS DIRECTED BY COUMADIN CLINIC (Patient taking differently: Take 3.75-7.5 mg by mouth daily. 3.75 mg Monday and Friday. 7.5 mg Tuesday,Wednesday,Thursday,Saturday and Sunday.) 60 tablet 1    Drug Regimen Review  Drug regimen was reviewed and remains appropriate with no significant issues identified  Home: Home Living Family/patient expects to be discharged to:: Private residence Living Arrangements: Alone Available Help at Discharge: Family,Available PRN/intermittently Type of Home: Apartment Home Access: Level entry Home Layout: One level Bathroom Shower/Tub: Tub/shower unit,Curtain Biochemist, clinical: Standard Bathroom Accessibility: Yes Home Equipment: Crutches  Lives With: Alone   Functional History: Prior Function Level of Independence: Independent with assistive device(s) Comments: uses a crutch when he has a gout attack. drives; pays his own bills; has intentions of working out  Functional Status:  Mobility: Bed Mobility Overal bed mobility: Needs Assistance Bed Mobility: Supine to Sit Supine to sit: Min assist Sit to supine: Min assist General bed mobility comments: min assist to progress LLE to EOB, scoot forward. Transfers Overall transfer level: Needs assistance Equipment used: Rolling walker (2 wheeled) Transfers: Sit to/from Stand Sit to Stand: Min assist General transfer comment: min assist for power up, rise, and steady. verbal cuing for hand placement when rising/sitting each time. STS as intervention, x5 throughout session Ambulation/Gait Ambulation/Gait assistance: Min assist Gait Distance (Feet): 30 Feet (x2) Assistive device: Rolling walker (2 wheeled) Gait  Pattern/deviations: Step-through pattern,Decreased stride length,Trunk flexed General Gait Details: min assist to steady, guide pt and RW during direction change. VC for placement in RW, upright posture, increasing step length. Gait velocity: decr    ADL: ADL Overall ADL's : Needs assistance/impaired Eating/Feeding: Modified independent Grooming: Set up,Supervision/safety,Sitting Upper Body Bathing: Set up,Supervision/ safety,Sitting Lower Body Bathing: Moderate assistance,Sit to/from stand Upper Body Dressing : Minimal assistance,Sitting Lower Body Dressing: Moderate assistance,Sit to/from stand Toilet Transfer: Moderate assistance,RW,Ambulation Armed forces technical officer Details (indicate cue type and reason): Min A with ambulating to bathroom however when turning requires at least mod A to prevent fall @ RW level; L bias Toileting- Clothing Manipulation and Hygiene: Maximal assistance Functional mobility during ADLs: Moderate assistance,Rolling walker,Cueing for safety,Cueing for sequencing General ADL Comments: States LB ADL tasks have become more difficlt to copmlete. Increased SOB with minimal activity  Cognition: Cognition Overall Cognitive Status: Impaired/Different from baseline Arousal/Alertness: Awake/alert Orientation Level: Oriented X4 Attention: Alternating,Selective Selective Attention: Impaired Selective Attention Impairment: Verbal complex,Functional complex Alternating Attention: Impaired Alternating Attention Impairment: Verbal complex,Functional complex Memory: Impaired Memory Impairment: Decreased recall of new information,Decreased short term memory Awareness: Impaired Problem Solving: Impaired Problem Solving Impairment: Verbal complex,Functional complex Executive Function: Organizing,Self Monitoring Organizing: Impaired Organizing Impairment:  Verbal complex,Functional complex Safety/Judgment: Impaired Cognition Arousal/Alertness: Awake/alert Behavior During  Therapy: WFL for tasks assessed/performed,Anxious Overall Cognitive Status: Impaired/Different from baseline Area of Impairment: Safety/judgement,Following commands,Problem solving Current Attention Level: Selective Memory: Decreased short-term memory Following Commands: Follows multi-step commands with increased time Safety/Judgement: Decreased awareness of safety Awareness: Emergent Problem Solving: Decreased initiation,Difficulty sequencing,Requires verbal cues,Requires tactile cues General Comments: mildly inattentive to L, cues to correct. Pt fearful of falling throughout standing activity.   Blood pressure (!) 140/95, pulse (!) 55, temperature 98.2 F (36.8 C), temperature source Oral, resp. rate 17, height 6' (1.829 m), weight 95.7 kg, SpO2 99 %. Physical Exam Vitals and nursing note reviewed.  Constitutional:      General: He is not in acute distress.    Appearance: Normal appearance.  HENT:     Head: Normocephalic and atraumatic.     Right Ear: External ear normal.     Left Ear: External ear normal.     Mouth/Throat:     Mouth: Mucous membranes are dry.  Eyes:     General:        Right eye: No discharge.        Left eye: No discharge.     Extraocular Movements: Extraocular movements intact.  Cardiovascular:     Comments: Irregularly irregular Pulmonary:     Effort: Pulmonary effort is normal. No respiratory distress.     Breath sounds: No stridor.  Abdominal:     General: Abdomen is flat. Bowel sounds are normal. There is no distension.  Musculoskeletal:        General: Swelling present.     Cervical back: Normal range of motion and neck supple.     Comments: 2+ pedal edema left ankle/distal tibia (gout flare 2 weeks ago). 1+ right pedal edema. No tenderness in extremities   Skin:    General: Skin is warm and dry.  Neurological:     Mental Status: He is alert and oriented to person, place, and time.     Comments: Alert. Mild left facial weakness without  dysarthria. Dysconjugate gaze. Diplopia improving.  Motor: RUE/RLE: 5/5 proximal distal LUE/LLE: 4+/5 proximal to distal Bilateral upper extremity dysmetria, improving  Psychiatric:        Mood and Affect: Mood normal.        Behavior: Behavior normal.        Thought Content: Thought content normal.    No results found for this or any previous visit (from the past 48 hour(s)). No results found.   Medical Problem List and Plan: 1.  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  -patient may shower  -ELOS/Goals: 9-13 days/Supervision/Mod I  Admit to CIR 2.  Antithrombotics: -DVT/anticoagulation:  Pharmaceutical: Lovenox  -antiplatelet therapy: ASA for a week-->repeat CT head on Monday 3. Pain Management: N/A 4. Mood: LCSW to follow for evaluation and support.   -antipsychotic agents: N/a 5. Neuropsych: This patient is capable of making decisions on  own behalf. 6. Skin/Wound Care: Routine pressure-relief measures. 7. Fluids/Electrolytes/Nutrition: Monitor intake/output.  Check labs in AM. 8.  HTN: Monitor blood pressures twice daily. Continue Losartan and coreg.   Monitor with increased mobility 9.  Mild elevation in white count: Monitor for fevers and other signs of infection.    CBC ordered for tomorrow 10.  CKD: SCR- 1.69 at admission and trending to baseline?    CMP ordered for tomorrow 11.  Gout hx: Continue allopurinol  Monitor for flares 12. Afib  Monitor with increased exertion  DOAC next week if CT stable  Bary Leriche, PA-C 05/01/2021  I have personally performed a face to face diagnostic evaluation, including, but not limited to relevant history and physical exam findings, of this patient and developed relevant assessment and plan.  Additionally, I have reviewed and concur with the physician assistant's documentation above.  Delice Lesch, MD,  ABPMR

## 2021-05-01 NOTE — Progress Notes (Signed)
Physical Medicine and Rehabilitation Consult   Reason for Consult: Stroke with functional deficits.  Referring Physician: Dr. Ree Kida  HPI: Jordan [name]. is a 75 y.o. 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  History taken from chart review, patient, and therapies.  UDS negative and  INR- 1.3 at admission. CT head showed subacute anterior right thalamic infarct and CTA head negative for LVO but showed enhancement defects proximal ICA bilaterally. MRI/MRA brain showed subacute infarct in ventral and medial right thalamus with small amount of petechial hemorrhage. Echocardiogram with EF of 45-50% with severe biatrial dilatation and mild AVR.  Carotid dopplers were negative for significant ICA stenosis. Dr. Erlinda Hong felt that stroke likely due to small vessel disease v/s subtherapeutic INR and recommended ASA with repeat CT head 5-7 days and transition to [place]. Therapy evaluations completed yesterday revealing unsteady gait with left lean, elevated BP with activity as well as diplopia and gait abnormality affecting ADLs. CIR recommended due to functional decline.    Review of Systems  Constitutional: Negative for chills and fever.  HENT: Negative for hearing loss.   Eyes: Positive for blurred vision and double vision.  Respiratory: Negative for shortness of breath.   Cardiovascular: Negative for chest pain and palpitations.  Gastrointestinal: Negative for constipation, heartburn and nausea.  Genitourinary: Negative for dysuria and urgency.  Musculoskeletal: Negative for myalgias and neck pain.  Skin: Negative for rash.  Neurological: Positive for dizziness (worse now) and weakness (has been ongoing for months). Negative for sensory change and headaches.  Psychiatric/Behavioral: Negative for hallucinations.  All other systems reviewed and are negative.        Past Medical History:  Diagnosis Date  . Atrial fib/flutter, transient    Hx  . Atrial fibrillation (HCC)    coumadin  . Colon cancer [facility])    family hx  . Diverticulosis   . Gout   . Hemorrhoids   . Hepatitis C   . History of DVT (deep vein thrombosis)   . History of nuclear stress test 04/2002   exercise; normal study   . Hypertension   . 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 Personal history of colonic polyps   . Prostate cancer [facility])    radical prostatectomy          Past Surgical History:  Procedure Laterality Date  . CARDIAC CATHETERIZATION  2007   no occlusive CAD  . COLONOSCOPY  [id number]   [name]  . PROSTATECTOMY    . PROSTATECTOMY  04-04   Dr. Risa Grill  . TRANSTHORACIC ECHOCARDIOGRAM  05/2006   EF [id number]; LV systolic function normal; mild focal basal septal hypertrophy; AV thickness mildly increased; LA mod-markedly dilate         Family History  Problem Relation Age of Onset  . Brain cancer Mother        brain tumor  . Colon cancer Mother   . Stroke Father   . Rectal cancer Neg Hx   . Stomach cancer Neg Hx   . Esophageal cancer Neg Hx     Social History:  Lives alone and independent PTA. Used to work for school bus Doctor, hospital. He reports that he has never smoked. He has never used smokeless tobacco. He reports current alcohol--couple of glasses of wine twice week. He reports that he does use marijuana --not  in month.    Allergies: No Known Allergies          Medications Prior to Admission  Medication Sig Dispense Refill  . allopurinol (ZYLOPRIM) 100 MG tablet Take 100 mg by mouth daily.    . carvedilol (COREG) 25 MG tablet TAKE 1 TABLET(25 MG) BY MOUTH TWICE DAILY WITH A MEAL (Patient taking differently: Take 25 mg by mouth 2 (two) times daily with a meal.) 60 tablet 7  . losartan (COZAAR) 100 MG tablet Take 100 mg by mouth daily.    . sildenafil (REVATIO) 20 MG  tablet Take 1 to 5 pills as needed 50 tablet 2  . warfarin (COUMADIN) 7.5 MG tablet TAKE 1 TABLET BY MOUTH DAILY AS DIRECTED BY COUMADIN CLINIC (Patient taking differently: Take 3.75-7.5 mg by mouth daily. 3.75 mg Monday and Friday. 7.5 mg Tuesday,Wednesday,Thursday,Saturday and Sunday.) 60 tablet 1    Home: Home Living Family/patient expects to be discharged to:: Private residence Living Arrangements: Alone Available Help at Discharge: Family,Available PRN/intermittently Type of Home: Apartment Home Access: Level entry Home Layout: One level Bathroom Shower/Tub: Tub/shower unit,Curtain Biochemist, clinical: Standard Bathroom Accessibility: Yes Home Equipment: Crutches  Functional History: Prior Function Level of Independence: Independent with assistive device(s) Comments: uses a crutch when he has a gout attack. drives; pays his own bills; has intentions of working out Functional Status:  Mobility: Bed Mobility Overal bed mobility: Needs Assistance Bed Mobility: Sit to Supine Supine to sit: Min assist Sit to supine: Min assist General bed mobility comments: Min A for LE assist for return to supine. Transfers Overall transfer level: Needs assistance Equipment used: 1 person hand held assist,2 person hand held assist Transfers: Sit to/from Stand Sit to Stand: Mod assist,+2 safety/equipment General transfer comment: Mod A for lift assist and steadying to stand. tended to have L lateral lean and required cues for upright posture. Mod A +2 to take side steps at EOB. Cues for sequencing and L lateral lean.  ADL: ADL Overall ADL's : Needs assistance/impaired Eating/Feeding: Modified independent Grooming: Set up,Supervision/safety,Sitting Upper Body Bathing: Set up,Supervision/ safety,Sitting Lower Body Bathing: Moderate assistance,Sit to/from stand Upper Body Dressing : Minimal assistance,Sitting Lower Body Dressing: Maximal assistance,Sit to/from stand Toilet Transfer: Moderate  assistance,+2 for physical assistance,Stand-pivot Toileting- Clothing Manipulation and Hygiene: Maximal assistance Functional mobility during ADLs: Moderate assistance,+2 for safety/equipment General ADL Comments: States LB ADL tasks have become more difficlt to copmlete. Increased SOB with minimal activity  Cognition: Cognition Overall Cognitive Status: Impaired/Different from baseline Orientation Level: Oriented X4 Cognition Arousal/Alertness: Awake/alert Behavior During Therapy: WFL for tasks assessed/performed Overall Cognitive Status: Impaired/Different from baseline Area of Impairment: Attention,Safety/judgement,Awareness,Problem solving Current Attention Level: Selective Safety/Judgement: Decreased awareness of safety,Decreased awareness of deficits Awareness: Emergent Problem Solving: Slow processing,Requires verbal cues,Requires tactile cues General Comments: leaning L - unaware of abnormal positioning of trunk   Blood pressure (!) 143/92, pulse 65, temperature 98.3 F (36.8 C), temperature source Oral, resp. rate 18, height 6' (1.829 m), weight 95.7 kg, SpO2 100 %. Physical Exam Vitals and nursing note reviewed.  Constitutional:      Appearance: Normal appearance.  HENT:     Head: Normocephalic and atraumatic.     Right Ear: External ear normal.     Left Ear: External ear normal.     Nose: Nose normal.  Eyes:     General:        Right eye: No discharge.        Left eye: No discharge.  Cardiovascular:  Comments: Irregularly irregular Pulmonary:     Effort: Pulmonary effort is normal. No respiratory distress.     Breath sounds: Normal breath sounds.  Abdominal:     General: Abdomen is flat. Bowel sounds are normal. There is no distension.  Musculoskeletal:        General: No swelling or tenderness.     Cervical back: Normal range of motion and neck supple.     Comments: No edema or tenderness in extremities  Skin:    General: Skin is warm and dry.   Neurological:     Mental Status: He is alert and oriented to person, place, and time.     Comments: Left pronator drift.  Disconjugate gaze.  Motor: RUE/RLE: 5/5 proximal distal LUE/LLE: 4+/5 proximal to distal Bilateral upper extremity dysmetria  Psychiatric:        Mood and Affect: Mood normal.        Behavior: Behavior normal.        Thought Content: Thought content normal.     Lab Results Last 24 Hours       Results for orders placed or performed during the hospital encounter of 04/27/21 (from the past 24 hour(s))  CBC with Differential/Platelet     Status: Abnormal   Collection Time: 04/27/21  8:08 PM  Result Value Ref Range   WBC 8.6 4.0 - 10.5 K/uL   RBC 6.29 (H) 4.22 - 5.81 MIL/uL   Hemoglobin 19.2 (H) 13.0 - 17.0 g/dL   HCT 55.7 (H) 39.0 - 52.0 %   MCV 88.6 80.0 - 100.0 fL   MCH 30.5 26.0 - 34.0 pg   MCHC 34.5 30.0 - 36.0 g/dL   RDW 14.6 11.5 - 15.5 %   Platelets 205 150 - 400 K/uL   nRBC 0.0 0.0 - 0.2 %   Neutrophils Relative % 66 %   Neutro Abs 5.8 1.7 - 7.7 K/uL   Lymphocytes Relative 18 %   Lymphs Abs 1.5 0.7 - 4.0 K/uL   Monocytes Relative 13 %   Monocytes Absolute 1.1 (H) 0.1 - 1.0 K/uL   Eosinophils Relative 1 %   Eosinophils Absolute 0.0 0.0 - 0.5 K/uL   Basophils Relative 1 %   Basophils Absolute 0.0 0.0 - 0.1 K/uL   Immature Granulocytes 1 %   Abs Immature Granulocytes 0.06 0.00 - 0.07 K/uL  Comprehensive metabolic panel     Status: Abnormal   Collection Time: 04/27/21  8:08 PM  Result Value Ref Range   Sodium 138 135 - 145 mmol/L   Potassium 3.6 3.5 - 5.1 mmol/L   Chloride 105 98 - 111 mmol/L   CO2 22 22 - 32 mmol/L   Glucose, Bld 105 (H) 70 - 99 mg/dL   BUN 28 (H) 8 - 23 mg/dL   Creatinine, Ser 1.69 (H) 0.61 - 1.24 mg/dL   Calcium 9.4 8.9 - 10.3 mg/dL   Total Protein 7.7 6.5 - 8.1 g/dL   Albumin 3.0 (L) 3.5 - 5.0 g/dL   AST 63 (H) 15 - 41 U/L   ALT 36 0 - 44 U/L   Alkaline Phosphatase 77 38 - 126  U/L   Total Bilirubin 2.0 (H) 0.3 - 1.2 mg/dL   GFR, Estimated 42 (L) >60 mL/min   Anion gap 11 5 - 15  Ethanol     Status: None   Collection Time: 04/27/21 10:13 PM  Result Value Ref Range   Alcohol, Ethyl (B) <10 <10 mg/dL  TSH  Status: None   Collection Time: 04/27/21 10:13 PM  Result Value Ref Range   TSH 2.994 0.350 - 4.500 uIU/mL  Brain natriuretic peptide     Status: Abnormal   Collection Time: 04/27/21 10:13 PM  Result Value Ref Range   B Natriuretic Peptide 446.7 (H) 0.0 - 100.0 pg/mL  RPR     Status: None   Collection Time: 04/27/21 10:13 PM  Result Value Ref Range   RPR Ser Ql NON REACTIVE NON REACTIVE  Protime-INR     Status: Abnormal   Collection Time: 04/27/21 10:13 PM  Result Value Ref Range   Prothrombin Time 16.6 (H) 11.4 - 15.2 seconds   INR 1.3 (H) 0.8 - 1.2  Magnesium     Status: None   Collection Time: 04/27/21 10:13 PM  Result Value Ref Range   Magnesium 2.0 1.7 - 2.4 mg/dL  Troponin I (High Sensitivity)     Status: Abnormal   Collection Time: 04/27/21 10:13 PM  Result Value Ref Range   Troponin I (High Sensitivity) 139 (HH) <18 ng/L  Urinalysis, Routine w reflex microscopic     Status: Abnormal   Collection Time: 04/27/21 11:11 PM  Result Value Ref Range   Color, Urine AMBER (A) YELLOW   APPearance HAZY (A) CLEAR   Specific Gravity, Urine 1.019 1.005 - 1.030   pH 5.0 5.0 - 8.0   Glucose, UA NEGATIVE NEGATIVE mg/dL   Hgb urine dipstick SMALL (A) NEGATIVE   Bilirubin Urine NEGATIVE NEGATIVE   Ketones, ur NEGATIVE NEGATIVE mg/dL   Protein, ur >=300 (A) NEGATIVE mg/dL   Nitrite NEGATIVE NEGATIVE   Leukocytes,Ua NEGATIVE NEGATIVE   RBC / HPF 0-5 0 - 5 RBC/hpf   WBC, UA 0-5 0 - 5 WBC/hpf   Bacteria, UA NONE SEEN NONE SEEN   Squamous Epithelial / LPF 0-5 0 - 5   Mucus PRESENT   Troponin I (High Sensitivity)     Status: Abnormal   Collection Time: 04/28/21  1:00 AM  Result Value Ref Range   Troponin I (High  Sensitivity) 125 (HH) <18 ng/L  SARS CORONAVIRUS 2 (TAT 6-24 HRS) Nasopharyngeal Nasopharyngeal Swab     Status: None   Collection Time: 04/28/21  4:41 AM   Specimen: Nasopharyngeal Swab  Result Value Ref Range   SARS Coronavirus 2 NEGATIVE NEGATIVE  Comprehensive metabolic panel     Status: Abnormal   Collection Time: 04/28/21  4:46 AM  Result Value Ref Range   Sodium 138 135 - 145 mmol/L   Potassium 3.6 3.5 - 5.1 mmol/L   Chloride 107 98 - 111 mmol/L   CO2 22 22 - 32 mmol/L   Glucose, Bld 119 (H) 70 - 99 mg/dL   BUN 25 (H) 8 - 23 mg/dL   Creatinine, Ser 1.69 (H) 0.61 - 1.24 mg/dL   Calcium 8.7 (L) 8.9 - 10.3 mg/dL   Total Protein 6.2 (L) 6.5 - 8.1 g/dL   Albumin 2.5 (L) 3.5 - 5.0 g/dL   AST 40 15 - 41 U/L   ALT 28 0 - 44 U/L   Alkaline Phosphatase 58 38 - 126 U/L   Total Bilirubin 1.4 (H) 0.3 - 1.2 mg/dL   GFR, Estimated 42 (L) >60 mL/min   Anion gap 9 5 - 15  CBC     Status: None   Collection Time: 04/28/21  4:46 AM  Result Value Ref Range   WBC 9.3 4.0 - 10.5 K/uL   RBC 5.49 4.22 - 5.81 MIL/uL  Hemoglobin 16.6 13.0 - 17.0 g/dL   HCT 49.2 39.0 - 52.0 %   MCV 89.6 80.0 - 100.0 fL   MCH 30.2 26.0 - 34.0 pg   MCHC 33.7 30.0 - 36.0 g/dL   RDW 14.2 11.5 - 15.5 %   Platelets 169 150 - 400 K/uL   nRBC 0.0 0.0 - 0.2 %  Protime-INR     Status: Abnormal   Collection Time: 04/28/21  4:46 AM  Result Value Ref Range   Prothrombin Time 17.6 (H) 11.4 - 15.2 seconds   INR 1.5 (H) 0.8 - 1.2  Troponin I (High Sensitivity)     Status: Abnormal   Collection Time: 04/28/21  4:46 AM  Result Value Ref Range   Troponin I (High Sensitivity) 140 (HH) <18 ng/L  Lipid panel     Status: Abnormal   Collection Time: 04/28/21  4:46 AM  Result Value Ref Range   Cholesterol 150 0 - 200 mg/dL   Triglycerides 51 <150 mg/dL   HDL 37 (L) >40 mg/dL   Total CHOL/HDL Ratio 4.1 RATIO   VLDL 10 0 - 40 mg/dL   LDL Cholesterol 103 (H) 0 - 99 mg/dL  Hemoglobin  A1c     Status: None   Collection Time: 04/28/21  4:47 AM  Result Value Ref Range   Hgb A1c MFr Bld 5.6 4.8 - 5.6 %   Mean Plasma Glucose 114.02 mg/dL  Urine rapid drug screen (hosp performed)     Status: None   Collection Time: 04/28/21  7:23 AM  Result Value Ref Range   Opiates NONE DETECTED NONE DETECTED   Cocaine NONE DETECTED NONE DETECTED   Benzodiazepines NONE DETECTED NONE DETECTED   Amphetamines NONE DETECTED NONE DETECTED   Tetrahydrocannabinol NONE DETECTED NONE DETECTED   Barbiturates NONE DETECTED NONE DETECTED      Imaging Results (Last 48 hours)  DG Chest 2 View  Result Date: 04/27/2021 CLINICAL DATA:  Weak and dizzy EXAM: CHEST - 2 VIEW COMPARISON:  10/20/2018 FINDINGS: Mild cardiomegaly. No focal opacity or pleural effusion. Aortic atherosclerosis. No pneumothorax. IMPRESSION: No active cardiopulmonary disease.  Borderline to mild cardiomegaly Electronically Signed   By: Donavan Foil M.D.   On: 04/27/2021 19:28   CT Head Wo Contrast  Result Date: 04/27/2021 CLINICAL DATA:  Dizziness and blurred vision. EXAM: CT HEAD WITHOUT CONTRAST TECHNIQUE: Contiguous axial images were obtained from the base of the skull through the vertex without intravenous contrast. COMPARISON:  October 20, 2018 FINDINGS: Brain: There is mild cerebral atrophy with widening of the extra-axial spaces and ventricular dilatation. There are areas of decreased attenuation within the white matter tracts of the supratentorial brain, consistent with microvascular disease changes. A 2.4 cm x 1.5 cm ill-defined area of low attenuation is seen within the anterior thalamic region on the right (axial CT image 16, CT series 4). This represents a new finding when compared to the prior exam. Very mild associated mass effect is seen with approximately 2 mm right to left midline shift (coronal reformatted image 40, CT series number 5). No associated acute hemorrhage is identified. Vascular: No hyperdense  vessel or unexpected calcification. Skull: Normal. Negative for fracture or focal lesion. Sinuses/Orbits: No acute finding. Other: None. IMPRESSION: Subacute anterior right thalamic infarct, as described above. MRI correlation is recommended. Electronically Signed   By: Virgina Norfolk M.D.   On: 04/27/2021 21:47   MR ANGIO HEAD WO CONTRAST  Result Date: 04/28/2021 CLINICAL DATA:  Neuro deficit, acute, stroke suspected;  Neuro deficit, acute, stroke suspected Left cranial nerve 6 palsy EXAM: MRI HEAD WITHOUT AND WITH CONTRAST MRA HEAD WITHOUT CONTRAST TECHNIQUE: Multiplanar, multiecho pulse sequences of the brain and surrounding structures were obtained without and with intravenous contrast. Angiographic images of the head were obtained using MRA technique without contrast. CONTRAST:  4mL GADAVIST GADOBUTROL 1 MMOL/ML IV SOLN COMPARISON:  None. FINDINGS: MRI HEAD FINDINGS Brain: Subacute infarct of the ventral and medial right thalamus. There is small amount of petechial hemorrhage without mass effect. No chronic microhemorrhage. There is multifocal hyperintense T2-weighted signal within the white matter. Generalized volume loss without a clear lobar predilection. Old right cerebellar infarct. There is no abnormal contrast enhancement. Vascular: Major flow voids are preserved. Skull and upper cervical spine: Normal calvarium and skull base. Visualized upper cervical spine and soft tissues are normal. Sinuses/Orbits:No paranasal sinus fluid levels or advanced mucosal thickening. No mastoid or middle ear effusion. Normal orbits. MRA HEAD FINDINGS POSTERIOR CIRCULATION: --Vertebral arteries: Normal --Inferior cerebellar arteries: Normal. --Basilar artery: Normal. --Superior cerebellar arteries: Normal. --Posterior cerebral arteries: Normal. The right PCA is partially supplied by a posterior communicating artery (p-comm). ANTERIOR CIRCULATION: --Intracranial internal carotid arteries: Normal. --Anterior cerebral  arteries (ACA): Normal. --Middle cerebral arteries (MCA): Normal. ANATOMIC VARIANTS: None IMPRESSION: 1. 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 2. Normal intracranial MRA. Electronically Signed   By: Ulyses Jarred M.D.   On: 04/28/2021 01:18   MR Brain W and Wo Contrast  Result Date: 04/28/2021 CLINICAL DATA:  Neuro deficit, acute, stroke suspected; Neuro deficit, acute, stroke suspected Left cranial nerve 6 palsy EXAM: MRI HEAD WITHOUT AND WITH CONTRAST MRA HEAD WITHOUT CONTRAST TECHNIQUE: Multiplanar, multiecho pulse sequences of the brain and surrounding structures were obtained without and with intravenous contrast. Angiographic images of the head were obtained using MRA technique without contrast. CONTRAST:  72mL GADAVIST GADOBUTROL 1 MMOL/ML IV SOLN COMPARISON:  None. FINDINGS: MRI HEAD FINDINGS Brain: Subacute infarct of the ventral and medial right thalamus. There is small amount of petechial hemorrhage without mass effect. No chronic microhemorrhage. There is multifocal hyperintense T2-weighted signal within the white matter. Generalized volume loss without a clear lobar predilection. Old right cerebellar infarct. There is no abnormal contrast enhancement. Vascular: Major flow voids are preserved. Skull and upper cervical spine: Normal calvarium and skull base. Visualized upper cervical spine and soft tissues are normal. Sinuses/Orbits:No paranasal sinus fluid levels or advanced mucosal thickening. No mastoid or middle ear effusion. Normal orbits. MRA HEAD FINDINGS POSTERIOR CIRCULATION: --Vertebral arteries: Normal --Inferior cerebellar arteries: Normal. --Basilar artery: Normal. --Superior cerebellar arteries: Normal. --Posterior cerebral arteries: Normal. The right PCA is partially supplied by a posterior communicating artery (p-comm). ANTERIOR  CIRCULATION: --Intracranial internal carotid arteries: Normal. --Anterior cerebral arteries (ACA): Normal. --Middle cerebral arteries (MCA): Normal. ANATOMIC VARIANTS: None IMPRESSION: 1. 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 2. Normal intracranial MRA. Electronically Signed   By: Ulyses Jarred M.D.   On: 04/28/2021 01:18   ECHOCARDIOGRAM COMPLETE  Result Date: 04/28/2021    ECHOCARDIOGRAM REPORT   Patient Name:   Jordan [name]. Date of Exam: 04/28/2021 Medical Rec #:  [id number]             Height:       72.0 in Accession #:  [id number]            Weight:       211.0 lb Date of Birth:  03/22/1946              BSA:          2.180 m Patient Age:    65 years              BP:           148/87 mmHg Patient Gender: M                     HR:           105 bpm. Exam Location:  Inpatient Procedure: 2D Echo Indications:    Stroke I63.9  History:        Patient has prior history of Echocardiogram examinations, most                 recent 12/07/2018. Arrythmias:Atrial Fibrillation; Risk                 Factors:Hypertension.  Sonographer:    Mikki Santee RDCS (AE) Referring Phys: [id number] [place]  1. Atrial fibrillation present. Left ventricular ejection fraction, by estimation, is 45 to 50%. The left ventricle has mildly decreased function. The left ventricle demonstrates regional wall motion abnormalities (see scoring diagram/findings for description). Left ventricular diastolic parameters are indeterminate.  2. Right ventricular systolic function is normal. The right ventricular size is mildly enlarged. There is normal pulmonary artery systolic pressure.  3. Left atrial size was severely dilated.  4. Right atrial size was severely dilated.  5. The mitral valve is normal in structure. No evidence of mitral valve regurgitation. No evidence of  mitral stenosis.  6. The aortic valve is tricuspid. Aortic valve regurgitation is mild. Mild to moderate aortic valve sclerosis/calcification is present, without any evidence of aortic stenosis.  7. There is borderline dilatation of the ascending aorta, measuring 39 mm.  8. The inferior vena cava is normal in size with greater than 50% respiratory variability, suggesting right atrial pressure of 3 mmHg. FINDINGS  Left Ventricle: Atrial fibrillation present. Left ventricular ejection fraction, by estimation, is 45 to 50%. The left ventricle has mildly decreased function. The left ventricle demonstrates regional wall motion abnormalities. The left ventricular internal cavity size was normal in size. There is no left ventricular hypertrophy. Left ventricular diastolic parameters are indeterminate.  LV Wall Scoring: The basal inferior segment is akinetic. The mid inferoseptal segment, mid inferior segment, and basal inferoseptal segment are hypokinetic. Right Ventricle: The right ventricular size is mildly enlarged. No increase in right ventricular wall thickness. Right ventricular systolic function is normal. There is normal pulmonary artery systolic pressure. The tricuspid regurgitant velocity is 2.14  m/s, and with an assumed right atrial pressure of 3 mmHg, the estimated right ventricular systolic pressure is [id number] mmHg. Left Atrium: Left atrial size was severely dilated. Right Atrium: Right atrial size was severely dilated. Pericardium: There is no evidence of pericardial effusion. Mitral Valve: The mitral valve is normal in structure. No evidence of mitral valve regurgitation. No evidence of mitral valve stenosis. Tricuspid Valve: The tricuspid valve is normal in structure. Tricuspid valve regurgitation is not demonstrated. No evidence of tricuspid stenosis. Aortic Valve: The aortic valve is tricuspid. Aortic valve regurgitation is mild. Aortic regurgitation PHT measures 714 msec. Mild to moderate aortic valve  sclerosis/calcification is present, without any evidence of aortic stenosis. Pulmonic Valve: The  pulmonic valve was normal in structure. Pulmonic valve regurgitation is not visualized. No evidence of pulmonic stenosis. Aorta: The aortic root is normal in size and structure. There is borderline dilatation of the ascending aorta, measuring 39 mm. Venous: The inferior vena cava is normal in size with greater than 50% respiratory variability, suggesting right atrial pressure of 3 mmHg. IAS/Shunts: No atrial level shunt detected by color flow Doppler.  LEFT VENTRICLE PLAX 2D LVIDd:         4.70 cm LVIDs:         3.30 cm LV PW:         1.50 cm LV IVS:        1.30 cm LVOT diam:     2.30 cm LV SV:         33 LV SV Index:   15 LVOT Area:     4.15 cm  RIGHT VENTRICLE RV S prime:     8.25 cm/s LEFT ATRIUM            Index       RIGHT ATRIUM           Index LA diam:      5.90 cm  2.71 cm/m  RA Area:     26.40 cm LA Vol (A2C): 100.0 ml 45.88 ml/m RA Volume:   92.70 ml  42.53 ml/m LA Vol (A4C): 127.0 ml 58.27 ml/m  AORTIC VALVE LVOT Vmax:   69.20 cm/s LVOT Vmean:  49.500 cm/s LVOT VTI:    0.081 m AI PHT:      714 msec  AORTA Ao Root diam: 3.30 cm MITRAL VALVE               TRICUSPID VALVE MV Area (PHT): 4.86 cm    TR Peak grad:   18.3 mmHg MV Decel Time: 156 msec    TR Vmax:        214.00 cm/s MV E velocity: 74.60 cm/s MV A velocity: 29.60 cm/s  SHUNTS MV E/A ratio:  2.52        Systemic VTI:  0.08 m                            Systemic Diam: 2.30 cm Candee Furbish MD Electronically signed by Candee Furbish MD Signature Date/Time: 04/28/2021/11:04:13 AM    Final    VAS US CAROTID  Result Date: 04/28/2021 Carotid Arterial Duplex Study Patient Name:  Jordan [name].  Date of Exam:   04/28/2021 Medical Rec #: [id number]              Accession #:    [id number] Date of Birth: 1946/12/23               Patient Gender: M Patient Age:   75Y Exam Location:  [facility] Procedure:      VAS US CAROTID Referring Phys: [id number]  Rosalin Hawking --------------------------------------------------------------------------------  Indications:       CVA. Bilateral carotid bulb abnormality seen on CTA. Risk Factors:      Hypertension. Comparison Study:  No prior study Performing Technologist: Maudry Mayhew MHA, RDMS, RVT, RDCS  Examination Guidelines: A complete evaluation includes B-mode imaging, spectral Doppler, color Doppler, and power Doppler as needed of all accessible portions of each vessel. Bilateral testing is considered an integral part of a complete examination. Limited examinations for reoccurring indications may be performed as noted.  Right Carotid Findings: +----------+--------+--------+--------+------------------+--------+  PSV cm/sEDV cm/sStenosisPlaque DescriptionComments +----------+--------+--------+--------+------------------+--------+ CCA Prox  50      12                                         +----------+--------+--------+--------+------------------+--------+ CCA Distal47      15                                         +----------+--------+--------+--------+------------------+--------+ ICA Prox  20      6                                          +----------+--------+--------+--------+------------------+--------+ ICA Distal35      9                                          +----------+--------+--------+--------+------------------+--------+ ECA       49      8                                          +----------+--------+--------+--------+------------------+--------+ +----------+--------+-------+----------------+-------------------+           PSV cm/sEDV cmsDescribe        Arm Pressure (mmHG) +----------+--------+-------+----------------+-------------------+ [id number]             Multiphasic, WNL                    +----------+--------+-------+----------------+-------------------+ +---------+--------+--+--------+-+---------+ VertebralPSV cm/s14EDV  cm/s5Antegrade +---------+--------+--+--------+-+---------+  Left Carotid Findings: +----------+--------+--------+--------+------------------+--------+           PSV cm/sEDV cm/sStenosisPlaque DescriptionComments +----------+--------+--------+--------+------------------+--------+ CCA Prox  45      13                                         +----------+--------+--------+--------+------------------+--------+ CCA Distal34      9                                          +----------+--------+--------+--------+------------------+--------+ ICA Prox  13      6                                          +----------+--------+--------+--------+------------------+--------+ ICA Distal31      11                                         +----------+--------+--------+--------+------------------+--------+ ECA       64      13                                         +----------+--------+--------+--------+------------------+--------+ +----------+--------+--------+----------------+-------------------+  PSV cm/sEDV cm/sDescribe        Arm Pressure (mmHG) +----------+--------+--------+----------------+-------------------+ [id number]              Multiphasic, WNL                    +----------+--------+--------+----------------+-------------------+ +---------+--------+--+--------+-+---------+ VertebralPSV cm/s19EDV cm/s8Antegrade +---------+--------+--+--------+-+---------+   Summary: Right Carotid: The extracranial vessels were near-normal with only minimal wall                thickening or plaque. Left Carotid: The extracranial vessels were near-normal with only minimal wall               thickening or plaque. Vertebrals:  Bilateral vertebral arteries demonstrate antegrade flow. Subclavians: Normal flow hemodynamics were seen in bilateral subclavian              arteries. *See table(s) above for measurements and observations.     Preliminary    CT ANGIO HEAD CODE  STROKE  Result Date: 04/28/2021 CLINICAL DATA:  Dizziness and hallucinations EXAM: CT ANGIOGRAPHY HEAD AND NECK TECHNIQUE: Multidetector CT imaging of the head and neck was performed using the standard protocol during bolus administration of intravenous contrast. Multiplanar CT image reconstructions and MIPs were obtained to evaluate the vascular anatomy. Carotid stenosis measurements (when applicable) are obtained utilizing NASCET criteria, using the distal internal carotid diameter as the denominator. CONTRAST:  13mL OMNIPAQUE IOHEXOL 350 MG/ML SOLN COMPARISON:  MRA brain 04/28/2021 FINDINGS: CTA NECK FINDINGS SKELETON: There is no bony spinal canal stenosis. No lytic or blastic lesion. OTHER NECK: Normal pharynx, larynx and major salivary glands. No cervical lymphadenopathy. Unremarkable thyroid gland. UPPER CHEST: No pneumothorax or pleural effusion. No nodules or masses. AORTIC ARCH: There is calcific atherosclerosis of the aortic arch. There is no aneurysm, dissection or hemodynamically significant stenosis of the visualized portion of the aorta. Conventional 3 vessel aortic branching pattern. The visualized proximal subclavian arteries are widely patent. CAROTID SYSTEMS: There are symmetric, curvilinear enhancement defects within both proximal internal carotid arteries. There is no stenosis of either carotid system. VERTEBRAL ARTERIES: Codominant configuration. Both origins are clearly patent. There is no dissection, occlusion or flow-limiting stenosis to the skull base (V1-V3 segments). CTA HEAD FINDINGS POSTERIOR CIRCULATION: --Vertebral arteries: Normal V4 segments. --Inferior cerebellar arteries: Normal. --Basilar artery: Normal. --Superior cerebellar arteries: Normal. --Posterior cerebral arteries (PCA): Normal. The right PCA is partially supplied by a posterior communicating artery (p-comm). ANTERIOR CIRCULATION: --Intracranial internal carotid arteries: Moderate narrowing of the right ICA at the  skull base. Mild bilateral atherosclerotic calcification. --Anterior cerebral arteries (ACA): Normal. Both A1 segments are present. Patent anterior communicating artery (a-comm). --Middle cerebral arteries (MCA): Normal. VENOUS SINUSES: As permitted by contrast timing, patent. ANATOMIC VARIANTS: None Review of the MIP images confirms the above findings. IMPRESSION: 1. No intracranial arterial occlusion or high-grade stenosis. 2. Symmetric, curvilinear enhancement defects within both proximal internal carotid arteries, favored to be artifacts related to flow turbulence. Carotid web is a secondary consideration, but the appearance is not typical. Dissection or thromboembolism are unlikely given the symmetry. 3. 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 Aortic Atherosclerosis (ICD10-I70.0). Electronically Signed   By: Ulyses Jarred M.D.   On: 04/28/2021 02:30   CT ANGIO NECK CODE STROKE  Result Date: 04/28/2021 CLINICAL DATA:  Dizziness and hallucinations EXAM: CT ANGIOGRAPHY HEAD AND NECK TECHNIQUE: Multidetector CT imaging of the head and neck was performed using the standard protocol during bolus administration of intravenous contrast. Multiplanar CT  image reconstructions and MIPs were obtained to evaluate the vascular anatomy. Carotid stenosis measurements (when applicable) are obtained utilizing NASCET criteria, using the distal internal carotid diameter as the denominator. CONTRAST:  12mL OMNIPAQUE IOHEXOL 350 MG/ML SOLN COMPARISON:  MRA brain 04/28/2021 FINDINGS: CTA NECK FINDINGS SKELETON: There is no bony spinal canal stenosis. No lytic or blastic lesion. OTHER NECK: Normal pharynx, larynx and major salivary glands. No cervical lymphadenopathy. Unremarkable thyroid gland. UPPER CHEST: No pneumothorax or pleural effusion. No nodules or masses. AORTIC ARCH: There is calcific atherosclerosis of the aortic arch. There is no aneurysm, dissection or hemodynamically significant  stenosis of the visualized portion of the aorta. Conventional 3 vessel aortic branching pattern. The visualized proximal subclavian arteries are widely patent. CAROTID SYSTEMS: There are symmetric, curvilinear enhancement defects within both proximal internal carotid arteries. There is no stenosis of either carotid system. VERTEBRAL ARTERIES: Codominant configuration. Both origins are clearly patent. There is no dissection, occlusion or flow-limiting stenosis to the skull base (V1-V3 segments). CTA HEAD FINDINGS POSTERIOR CIRCULATION: --Vertebral arteries: Normal V4 segments. --Inferior cerebellar arteries: Normal. --Basilar artery: Normal. --Superior cerebellar arteries: Normal. --Posterior cerebral arteries (PCA): Normal. The right PCA is partially supplied by a posterior communicating artery (p-comm). ANTERIOR CIRCULATION: --Intracranial internal carotid arteries: Moderate narrowing of the right ICA at the skull base. Mild bilateral atherosclerotic calcification. --Anterior cerebral arteries (ACA): Normal. Both A1 segments are present. Patent anterior communicating artery (a-comm). --Middle cerebral arteries (MCA): Normal. VENOUS SINUSES: As permitted by contrast timing, patent. ANATOMIC VARIANTS: None Review of the MIP images confirms the above findings. IMPRESSION: 1. No intracranial arterial occlusion or high-grade stenosis. 2. Symmetric, curvilinear enhancement defects within both proximal internal carotid arteries, favored to be artifacts related to flow turbulence. Carotid web is a secondary consideration, but the appearance is not typical. Dissection or thromboembolism are unlikely given the symmetry. 3. 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 Aortic Atherosclerosis (ICD10-I70.0). Electronically Signed   By: Ulyses Jarred M.D.   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  Stroke: Continue secondary stroke prophylaxis and Risk Factor Modification listed below:   Antiplatelet therapy:   Blood Pressure Management:  Continue current medication with prn's with permisive HTN per primary team Statin Agent:   Labs independently reviewed.  Records reviewed and summated above.  1. Does the need for close, 24 hr/day medical supervision in concert with the patient's rehab needs make it unreasonable for this patient to be served in a less intensive setting? Yes  2. 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 Due to safety, disease management and patient education, does the patient require 24 hr/day rehab nursing? Yes 4. Does the patient require coordinated care of a physician, rehab nurse, therapy disciplines of PT/OT to address physical and functional deficits in the context of the above medical diagnosis(es)? Yes Addressing deficits in the following areas: balance, endurance, locomotion, strength, transferring, bathing, dressing, grooming, toileting and psychosocial support 5. Can the patient actively participate in an intensive therapy program of at least 3 hrs of therapy per day at least 5 days per week? Yes 6. The potential for patient to make measurable gains while on inpatient rehab is excellent 7.  Anticipated functional outcomes upon discharge from inpatient rehab are supervision  with PT, supervision with OT, n/a with SLP. 8. Estimated rehab length of stay to reach the above functional goals is: 10-14 days. 9. Anticipated discharge destination: Home 10. Overall Rehab/Functional Prognosis: good  RECOMMENDATIONS: This patient's condition is appropriate for continued  rehabilitative care in the following setting: CIR caregiver support available upon discharge. Patient has agreed to participate in recommended program. Yes Note that insurance prior authorization may be required for reimbursement for recommended care.  Comment: Rehab Admissions Coordinator to follow up.  I have personally performed a face to face diagnostic evaluation, including, but not limited to relevant history and      Physical Medicine and Rehabilitation Consult   Reason for Consult: Stroke with functional deficits.  Referring Physician: Dr. Ree Kida  HPI: Jordan [name]. is a 76 y.o. 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  History taken from chart review, patient, and therapies.  UDS negative and  INR- 1.3 at admission. CT head showed subacute anterior right thalamic infarct and CTA head negative for LVO but showed enhancement defects proximal ICA bilaterally. MRI/MRA brain showed subacute infarct in ventral and medial right thalamus with small amount of petechial hemorrhage. Echocardiogram with EF of 45-50% with severe biatrial dilatation and mild AVR.  Carotid dopplers were negative for significant ICA stenosis. Dr. Erlinda Hong felt that stroke likely due to small vessel disease v/s subtherapeutic INR and recommended ASA with repeat CT head 5-7 days and transition to [place]. Therapy evaluations completed yesterday revealing unsteady gait with left lean, elevated BP with activity as well as diplopia and gait abnormality affecting ADLs. CIR recommended due to functional decline.    Review of Systems  Constitutional: Negative for chills and fever.  HENT: Negative for hearing loss.   Eyes: Positive for blurred vision and double vision.  Respiratory: Negative for shortness of breath.   Cardiovascular: Negative for chest pain and palpitations.   Gastrointestinal: Negative for constipation, heartburn and nausea.  Genitourinary: Negative for dysuria and urgency.  Musculoskeletal: Negative for myalgias and neck pain.  Skin: Negative for rash.  Neurological: Positive for dizziness (worse now) and weakness (has been ongoing for months). Negative for sensory change and headaches.  Psychiatric/Behavioral: Negative for hallucinations.  All other systems reviewed and are negative.       Past Medical History:  Diagnosis Date  . Atrial fib/flutter, transient    Hx  . Atrial fibrillation (HCC)    coumadin  . Colon cancer [facility])    family hx  . Diverticulosis   . Gout   . Hemorrhoids   . Hepatitis C   . History of DVT (deep vein thrombosis)   . History of nuclear stress test 04/2002   exercise; normal study   . Hypertension   . 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 Personal history of colonic polyps   . Prostate cancer [facility])    radical prostatectomy          Past Surgical History:  Procedure Laterality Date  . CARDIAC CATHETERIZATION  2007   no occlusive CAD  . COLONOSCOPY  [id number]   [name]  . PROSTATECTOMY    . PROSTATECTOMY  04-04   Dr. Risa Grill  . TRANSTHORACIC ECHOCARDIOGRAM  05/2006   EF [id number]; LV systolic function normal; mild focal basal septal hypertrophy; AV thickness  mildly increased; LA mod-markedly dilate         Family History  Problem Relation Age of Onset  . Brain cancer Mother        brain tumor  . Colon cancer Mother   . Stroke Father   . Rectal cancer Neg Hx   . Stomach cancer Neg Hx   . Esophageal cancer Neg Hx     Social History:  Lives alone and independent PTA. Used to work for school bus Doctor, hospital. He reports that he has never smoked. He has never used smokeless tobacco. He reports current alcohol--couple of glasses of wine twice week. He reports that he does use marijuana --not in month.    Allergies: No  Known Allergies          Medications Prior to Admission  Medication Sig Dispense Refill  . allopurinol (ZYLOPRIM) 100 MG tablet Take 100 mg by mouth daily.    . carvedilol (COREG) 25 MG tablet TAKE 1 TABLET(25 MG) BY MOUTH TWICE DAILY WITH A MEAL (Patient taking differently: Take 25 mg by mouth 2 (two) times daily with a meal.) 60 tablet 7  . losartan (COZAAR) 100 MG tablet Take 100 mg by mouth daily.    . sildenafil (REVATIO) 20 MG tablet Take 1 to 5 pills as needed 50 tablet 2  . warfarin (COUMADIN) 7.5 MG tablet TAKE 1 TABLET BY MOUTH DAILY AS DIRECTED BY COUMADIN CLINIC (Patient taking differently: Take 3.75-7.5 mg by mouth daily. 3.75 mg Monday and Friday. 7.5 mg Tuesday,Wednesday,Thursday,Saturday and Sunday.) 60 tablet 1    Home: Home Living Family/patient expects to be discharged to:: Private residence Living Arrangements: Alone Available Help at Discharge: Family,Available PRN/intermittently Type of Home: Apartment Home Access: Level entry Home Layout: One level Bathroom Shower/Tub: Tub/shower unit,Curtain Biochemist, clinical: Standard Bathroom Accessibility: Yes Home Equipment: Crutches  Functional History: Prior Function Level of Independence: Independent with assistive device(s) Comments: uses a crutch when he has a gout attack. drives; pays his own bills; has intentions of working out Functional Status:  Mobility: Bed Mobility Overal bed mobility: Needs Assistance Bed Mobility: Sit to Supine Supine to sit: Min assist Sit to supine: Min assist General bed mobility comments: Min A for LE assist for return to supine. Transfers Overall transfer level: Needs assistance Equipment used: 1 person hand held assist,2 person hand held assist Transfers: Sit to/from Stand Sit to Stand: Mod assist,+2 safety/equipment General transfer comment: Mod A for lift assist and steadying to stand. tended to have L lateral lean and required cues for upright posture. Mod A +2 to take  side steps at EOB. Cues for sequencing and L lateral lean.  ADL: ADL Overall ADL's : Needs assistance/impaired Eating/Feeding: Modified independent Grooming: Set up,Supervision/safety,Sitting Upper Body Bathing: Set up,Supervision/ safety,Sitting Lower Body Bathing: Moderate assistance,Sit to/from stand Upper Body Dressing : Minimal assistance,Sitting Lower Body Dressing: Maximal assistance,Sit to/from stand Toilet Transfer: Moderate assistance,+2 for physical assistance,Stand-pivot Toileting- Clothing Manipulation and Hygiene: Maximal assistance Functional mobility during ADLs: Moderate assistance,+2 for safety/equipment General ADL Comments: States LB ADL tasks have become more difficlt to copmlete. Increased SOB with minimal activity  Cognition: Cognition Overall Cognitive Status: Impaired/Different from baseline Orientation Level: Oriented X4 Cognition Arousal/Alertness: Awake/alert Behavior During Therapy: WFL for tasks assessed/performed Overall Cognitive Status: Impaired/Different from baseline Area of Impairment: Attention,Safety/judgement,Awareness,Problem solving Current Attention Level: Selective Safety/Judgement: Decreased awareness of safety,Decreased awareness of deficits Awareness: Emergent Problem Solving: Slow processing,Requires verbal cues,Requires tactile cues General Comments: leaning L - unaware of abnormal positioning of  trunk   Blood pressure (!) 143/92, pulse 65, temperature 98.3 F (36.8 C), temperature source Oral, resp. rate 18, height 6' (1.829 m), weight 95.7 kg, SpO2 100 %. Physical Exam Vitals and nursing note reviewed.  Constitutional:      Appearance: Normal appearance.  HENT:     Head: Normocephalic and atraumatic.     Right Ear: External ear normal.     Left Ear: External ear normal.     Nose: Nose normal.  Eyes:     General:        Right eye: No discharge.        Left eye: No discharge.  Cardiovascular:     Comments: Irregularly  irregular Pulmonary:     Effort: Pulmonary effort is normal. No respiratory distress.     Breath sounds: Normal breath sounds.  Abdominal:     General: Abdomen is flat. Bowel sounds are normal. There is no distension.  Musculoskeletal:        General: No swelling or tenderness.     Cervical back: Normal range of motion and neck supple.     Comments: No edema or tenderness in extremities  Skin:    General: Skin is warm and dry.  Neurological:     Mental Status: He is alert and oriented to person, place, and time.     Comments: Left pronator drift.  Disconjugate gaze.  Motor: RUE/RLE: 5/5 proximal distal LUE/LLE: 4+/5 proximal to distal Bilateral upper extremity dysmetria  Psychiatric:        Mood and Affect: Mood normal.        Behavior: Behavior normal.        Thought Content: Thought content normal.     Lab Results Last 24 Hours       Results for orders placed or performed during the hospital encounter of 04/27/21 (from the past 24 hour(s))  CBC with Differential/Platelet     Status: Abnormal   Collection Time: 04/27/21  8:08 PM  Result Value Ref Range   WBC 8.6 4.0 - 10.5 K/uL   RBC 6.29 (H) 4.22 - 5.81 MIL/uL   Hemoglobin 19.2 (H) 13.0 - 17.0 g/dL   HCT 55.7 (H) 39.0 - 52.0 %   MCV 88.6 80.0 - 100.0 fL   MCH 30.5 26.0 - 34.0 pg   MCHC 34.5 30.0 - 36.0 g/dL   RDW 14.6 11.5 - 15.5 %   Platelets 205 150 - 400 K/uL   nRBC 0.0 0.0 - 0.2 %   Neutrophils Relative % 66 %   Neutro Abs 5.8 1.7 - 7.7 K/uL   Lymphocytes Relative 18 %   Lymphs Abs 1.5 0.7 - 4.0 K/uL   Monocytes Relative 13 %   Monocytes Absolute 1.1 (H) 0.1 - 1.0 K/uL   Eosinophils Relative 1 %   Eosinophils Absolute 0.0 0.0 - 0.5 K/uL   Basophils Relative 1 %   Basophils Absolute 0.0 0.0 - 0.1 K/uL   Immature Granulocytes 1 %   Abs Immature Granulocytes 0.06 0.00 - 0.07 K/uL  Comprehensive metabolic panel     Status: Abnormal   Collection Time: 04/27/21  8:08 PM  Result Value  Ref Range   Sodium 138 135 - 145 mmol/L   Potassium 3.6 3.5 - 5.1 mmol/L   Chloride 105 98 - 111 mmol/L   CO2 22 22 - 32 mmol/L   Glucose, Bld 105 (H) 70 - 99 mg/dL   BUN 28 (H) 8 - 23 mg/dL   Creatinine, Ser  1.69 (H) 0.61 - 1.24 mg/dL   Calcium 9.4 8.9 - 10.3 mg/dL   Total Protein 7.7 6.5 - 8.1 g/dL   Albumin 3.0 (L) 3.5 - 5.0 g/dL   AST 63 (H) 15 - 41 U/L   ALT 36 0 - 44 U/L   Alkaline Phosphatase 77 38 - 126 U/L   Total Bilirubin 2.0 (H) 0.3 - 1.2 mg/dL   GFR, Estimated 42 (L) >60 mL/min   Anion gap 11 5 - 15  Ethanol     Status: None   Collection Time: 04/27/21 10:13 PM  Result Value Ref Range   Alcohol, Ethyl (B) <10 <10 mg/dL  TSH     Status: None   Collection Time: 04/27/21 10:13 PM  Result Value Ref Range   TSH 2.994 0.350 - 4.500 uIU/mL  Brain natriuretic peptide     Status: Abnormal   Collection Time: 04/27/21 10:13 PM  Result Value Ref Range   B Natriuretic Peptide 446.7 (H) 0.0 - 100.0 pg/mL  RPR     Status: None   Collection Time: 04/27/21 10:13 PM  Result Value Ref Range   RPR Ser Ql NON REACTIVE NON REACTIVE  Protime-INR     Status: Abnormal   Collection Time: 04/27/21 10:13 PM  Result Value Ref Range   Prothrombin Time 16.6 (H) 11.4 - 15.2 seconds   INR 1.3 (H) 0.8 - 1.2  Magnesium     Status: None   Collection Time: 04/27/21 10:13 PM  Result Value Ref Range   Magnesium 2.0 1.7 - 2.4 mg/dL  Troponin I (High Sensitivity)     Status: Abnormal   Collection Time: 04/27/21 10:13 PM  Result Value Ref Range   Troponin I (High Sensitivity) 139 (HH) <18 ng/L  Urinalysis, Routine w reflex microscopic     Status: Abnormal   Collection Time: 04/27/21 11:11 PM  Result Value Ref Range   Color, Urine AMBER (A) YELLOW   APPearance HAZY (A) CLEAR   Specific Gravity, Urine 1.019 1.005 - 1.030   pH 5.0 5.0 - 8.0   Glucose, UA NEGATIVE NEGATIVE mg/dL   Hgb urine dipstick SMALL (A) NEGATIVE   Bilirubin Urine NEGATIVE NEGATIVE    Ketones, ur NEGATIVE NEGATIVE mg/dL   Protein, ur >=300 (A) NEGATIVE mg/dL   Nitrite NEGATIVE NEGATIVE   Leukocytes,Ua NEGATIVE NEGATIVE   RBC / HPF 0-5 0 - 5 RBC/hpf   WBC, UA 0-5 0 - 5 WBC/hpf   Bacteria, UA NONE SEEN NONE SEEN   Squamous Epithelial / LPF 0-5 0 - 5   Mucus PRESENT   Troponin I (High Sensitivity)     Status: Abnormal   Collection Time: 04/28/21  1:00 AM  Result Value Ref Range   Troponin I (High Sensitivity) 125 (HH) <18 ng/L  SARS CORONAVIRUS 2 (TAT 6-24 HRS) Nasopharyngeal Nasopharyngeal Swab     Status: None   Collection Time: 04/28/21  4:41 AM   Specimen: Nasopharyngeal Swab  Result Value Ref Range   SARS Coronavirus 2 NEGATIVE NEGATIVE  Comprehensive metabolic panel     Status: Abnormal   Collection Time: 04/28/21  4:46 AM  Result Value Ref Range   Sodium 138 135 - 145 mmol/L   Potassium 3.6 3.5 - 5.1 mmol/L   Chloride 107 98 - 111 mmol/L   CO2 22 22 - 32 mmol/L   Glucose, Bld 119 (H) 70 - 99 mg/dL   BUN 25 (H) 8 - 23 mg/dL   Creatinine, Ser 1.69 (H) 0.61 -  1.24 mg/dL   Calcium 8.7 (L) 8.9 - 10.3 mg/dL   Total Protein 6.2 (L) 6.5 - 8.1 g/dL   Albumin 2.5 (L) 3.5 - 5.0 g/dL   AST 40 15 - 41 U/L   ALT 28 0 - 44 U/L   Alkaline Phosphatase 58 38 - 126 U/L   Total Bilirubin 1.4 (H) 0.3 - 1.2 mg/dL   GFR, Estimated 42 (L) >60 mL/min   Anion gap 9 5 - 15  CBC     Status: None   Collection Time: 04/28/21  4:46 AM  Result Value Ref Range   WBC 9.3 4.0 - 10.5 K/uL   RBC 5.49 4.22 - 5.81 MIL/uL   Hemoglobin 16.6 13.0 - 17.0 g/dL   HCT 49.2 39.0 - 52.0 %   MCV 89.6 80.0 - 100.0 fL   MCH 30.2 26.0 - 34.0 pg   MCHC 33.7 30.0 - 36.0 g/dL   RDW 14.2 11.5 - 15.5 %   Platelets 169 150 - 400 K/uL   nRBC 0.0 0.0 - 0.2 %  Protime-INR     Status: Abnormal   Collection Time: 04/28/21  4:46 AM  Result Value Ref Range   Prothrombin Time 17.6 (H) 11.4 - 15.2 seconds   INR 1.5 (H) 0.8 - 1.2  Troponin I (High  Sensitivity)     Status: Abnormal   Collection Time: 04/28/21  4:46 AM  Result Value Ref Range   Troponin I (High Sensitivity) 140 (HH) <18 ng/L  Lipid panel     Status: Abnormal   Collection Time: 04/28/21  4:46 AM  Result Value Ref Range   Cholesterol 150 0 - 200 mg/dL   Triglycerides 51 <150 mg/dL   HDL 37 (L) >40 mg/dL   Total CHOL/HDL Ratio 4.1 RATIO   VLDL 10 0 - 40 mg/dL   LDL Cholesterol 103 (H) 0 - 99 mg/dL  Hemoglobin A1c     Status: None   Collection Time: 04/28/21  4:47 AM  Result Value Ref Range   Hgb A1c MFr Bld 5.6 4.8 - 5.6 %   Mean Plasma Glucose 114.02 mg/dL  Urine rapid drug screen (hosp performed)     Status: None   Collection Time: 04/28/21  7:23 AM  Result Value Ref Range   Opiates NONE DETECTED NONE DETECTED   Cocaine NONE DETECTED NONE DETECTED   Benzodiazepines NONE DETECTED NONE DETECTED   Amphetamines NONE DETECTED NONE DETECTED   Tetrahydrocannabinol NONE DETECTED NONE DETECTED   Barbiturates NONE DETECTED NONE DETECTED      Imaging Results (Last 48 hours)  DG Chest 2 View  Result Date: 04/27/2021 CLINICAL DATA:  Weak and dizzy EXAM: CHEST - 2 VIEW COMPARISON:  10/20/2018 FINDINGS: Mild cardiomegaly. No focal opacity or pleural effusion. Aortic atherosclerosis. No pneumothorax. IMPRESSION: No active cardiopulmonary disease.  Borderline to mild cardiomegaly Electronically Signed   By: Donavan Foil M.D.   On: 04/27/2021 19:28   CT Head Wo Contrast  Result Date: 04/27/2021 CLINICAL DATA:  Dizziness and blurred vision. EXAM: CT HEAD WITHOUT CONTRAST TECHNIQUE: Contiguous axial images were obtained from the base of the skull through the vertex without intravenous contrast. COMPARISON:  October 20, 2018 FINDINGS: Brain: There is mild cerebral atrophy with widening of the extra-axial spaces and ventricular dilatation. There are areas of decreased attenuation within the white matter tracts of the supratentorial brain, consistent with  microvascular disease changes. A 2.4 cm x 1.5 cm ill-defined area of low attenuation is seen within the anterior  thalamic region on the right (axial CT image 16, CT series 4). This represents a new finding when compared to the prior exam. Very mild associated mass effect is seen with approximately 2 mm right to left midline shift (coronal reformatted image 40, CT series number 5). No associated acute hemorrhage is identified. Vascular: No hyperdense vessel or unexpected calcification. Skull: Normal. Negative for fracture or focal lesion. Sinuses/Orbits: No acute finding. Other: None. IMPRESSION: Subacute anterior right thalamic infarct, as described above. MRI correlation is recommended. Electronically Signed   By: Virgina Norfolk M.D.   On: 04/27/2021 21:47   MR ANGIO HEAD WO CONTRAST  Result Date: 04/28/2021 CLINICAL DATA:  Neuro deficit, acute, stroke suspected; Neuro deficit, acute, stroke suspected Left cranial nerve 6 palsy EXAM: MRI HEAD WITHOUT AND WITH CONTRAST MRA HEAD WITHOUT CONTRAST TECHNIQUE: Multiplanar, multiecho pulse sequences of the brain and surrounding structures were obtained without and with intravenous contrast. Angiographic images of the head were obtained using MRA technique without contrast. CONTRAST:  85mL GADAVIST GADOBUTROL 1 MMOL/ML IV SOLN COMPARISON:  None. FINDINGS: MRI HEAD FINDINGS Brain: Subacute infarct of the ventral and medial right thalamus. There is small amount of petechial hemorrhage without mass effect. No chronic microhemorrhage. There is multifocal hyperintense T2-weighted signal within the white matter. Generalized volume loss without a clear lobar predilection. Old right cerebellar infarct. There is no abnormal contrast enhancement. Vascular: Major flow voids are preserved. Skull and upper cervical spine: Normal calvarium and skull base. Visualized upper cervical spine and soft tissues are normal. Sinuses/Orbits:No paranasal sinus fluid levels or advanced  mucosal thickening. No mastoid or middle ear effusion. Normal orbits. MRA HEAD FINDINGS POSTERIOR CIRCULATION: --Vertebral arteries: Normal --Inferior cerebellar arteries: Normal. --Basilar artery: Normal. --Superior cerebellar arteries: Normal. --Posterior cerebral arteries: Normal. The right PCA is partially supplied by a posterior communicating artery (p-comm). ANTERIOR CIRCULATION: --Intracranial internal carotid arteries: Normal. --Anterior cerebral arteries (ACA): Normal. --Middle cerebral arteries (MCA): Normal. ANATOMIC VARIANTS: None IMPRESSION: 1. 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 2. Normal intracranial MRA. Electronically Signed   By: Ulyses Jarred M.D.   On: 04/28/2021 01:18   MR Brain W and Wo Contrast  Result Date: 04/28/2021 CLINICAL DATA:  Neuro deficit, acute, stroke suspected; Neuro deficit, acute, stroke suspected Left cranial nerve 6 palsy EXAM: MRI HEAD WITHOUT AND WITH CONTRAST MRA HEAD WITHOUT CONTRAST TECHNIQUE: Multiplanar, multiecho pulse sequences of the brain and surrounding structures were obtained without and with intravenous contrast. Angiographic images of the head were obtained using MRA technique without contrast. CONTRAST:  46mL GADAVIST GADOBUTROL 1 MMOL/ML IV SOLN COMPARISON:  None. FINDINGS: MRI HEAD FINDINGS Brain: Subacute infarct of the ventral and medial right thalamus. There is small amount of petechial hemorrhage without mass effect. No chronic microhemorrhage. There is multifocal hyperintense T2-weighted signal within the white matter. Generalized volume loss without a clear lobar predilection. Old right cerebellar infarct. There is no abnormal contrast enhancement. Vascular: Major flow voids are preserved. Skull and upper cervical spine: Normal calvarium and skull base. Visualized upper cervical spine and soft  tissues are normal. Sinuses/Orbits:No paranasal sinus fluid levels or advanced mucosal thickening. No mastoid or middle ear effusion. Normal orbits. MRA HEAD FINDINGS POSTERIOR CIRCULATION: --Vertebral arteries: Normal --Inferior cerebellar arteries: Normal. --Basilar artery: Normal. --Superior cerebellar arteries: Normal. --Posterior cerebral arteries: Normal. The right PCA is partially supplied by a posterior communicating artery (p-comm). ANTERIOR  CIRCULATION: --Intracranial internal carotid arteries: Normal. --Anterior cerebral arteries (ACA): Normal. --Middle cerebral arteries (MCA): Normal. ANATOMIC VARIANTS: None IMPRESSION: 1. 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 2. Normal intracranial MRA. Electronically Signed   By: Ulyses Jarred M.D.   On: 04/28/2021 01:18   ECHOCARDIOGRAM COMPLETE  Result Date: 04/28/2021    ECHOCARDIOGRAM REPORT   Patient Name:   Jordan [name]. Date of Exam: 04/28/2021 Medical Rec #:  [id number]             Height:       72.0 in Accession #:    [id number]            Weight:       211.0 lb Date of Birth:  [birth year]              BSA:          2.180 m Patient Age:    37 years              BP:           148/87 mmHg Patient Gender: M                     HR:           105 bpm. Exam Location:  Inpatient Procedure: 2D Echo Indications:    Stroke I63.9  History:        Patient has prior history of Echocardiogram examinations, most                 recent 12/07/2018. Arrythmias:Atrial Fibrillation; Risk                 Factors:Hypertension.  Sonographer:    Mikki Santee RDCS (AE) Referring Phys: [id number] [place]  1. Atrial fibrillation present. Left ventricular ejection fraction, by estimation, is 45 to 50%. The left ventricle has mildly decreased function. The left ventricle demonstrates regional wall motion  abnormalities (see scoring diagram/findings for description). Left ventricular diastolic parameters are indeterminate.  2. Right ventricular systolic function is normal. The right ventricular size is mildly enlarged. There is normal pulmonary artery systolic pressure.  3. Left atrial size was severely dilated.  4. Right atrial size was severely dilated.  5. The mitral valve is normal in structure. No evidence of mitral valve regurgitation. No evidence of mitral stenosis.  6. The aortic valve is tricuspid. Aortic valve regurgitation is mild. Mild to moderate aortic valve sclerosis/calcification is present, without any evidence of aortic stenosis.  7. There is borderline dilatation of the ascending aorta, measuring 39 mm.  8. The inferior vena cava is normal in size with greater than 50% respiratory variability, suggesting right atrial pressure of 3 mmHg. FINDINGS  Left Ventricle: Atrial fibrillation present. Left ventricular ejection fraction, by estimation, is 45 to 50%. The left ventricle has mildly decreased function. The left ventricle demonstrates regional wall motion abnormalities. The left ventricular internal cavity size was normal in size. There is no left ventricular hypertrophy. Left ventricular diastolic parameters are indeterminate.  LV Wall Scoring: The basal inferior segment is akinetic. The mid inferoseptal segment, mid inferior segment, and basal inferoseptal segment are hypokinetic. Right Ventricle: The right ventricular size is mildly enlarged. No increase in right ventricular wall thickness. Right ventricular systolic function is normal. There is normal pulmonary artery systolic pressure. The tricuspid regurgitant velocity  is 2.14  m/s, and with an assumed right atrial pressure of 3 mmHg, the estimated right ventricular systolic pressure is [id number] mmHg. Left Atrium: Left atrial size was severely dilated. Right Atrium: Right atrial size was severely dilated. Pericardium: There is no evidence of  pericardial effusion. Mitral Valve: The mitral valve is normal in structure. No evidence of mitral valve regurgitation. No evidence of mitral valve stenosis. Tricuspid Valve: The tricuspid valve is normal in structure. Tricuspid valve regurgitation is not demonstrated. No evidence of tricuspid stenosis. Aortic Valve: The aortic valve is tricuspid. Aortic valve regurgitation is mild. Aortic regurgitation PHT measures 714 msec. Mild to moderate aortic valve sclerosis/calcification is present, without any evidence of aortic stenosis. Pulmonic Valve: The pulmonic valve was normal in structure. Pulmonic valve regurgitation is not visualized. No evidence of pulmonic stenosis. Aorta: The aortic root is normal in size and structure. There is borderline dilatation of the ascending aorta, measuring 39 mm. Venous: The inferior vena cava is normal in size with greater than 50% respiratory variability, suggesting right atrial pressure of 3 mmHg. IAS/Shunts: No atrial level shunt detected by color flow Doppler.  LEFT VENTRICLE PLAX 2D LVIDd:         4.70 cm LVIDs:         3.30 cm LV PW:         1.50 cm LV IVS:        1.30 cm LVOT diam:     2.30 cm LV SV:         33 LV SV Index:   15 LVOT Area:     4.15 cm  RIGHT VENTRICLE RV S prime:     8.25 cm/s LEFT ATRIUM            Index       RIGHT ATRIUM           Index LA diam:      5.90 cm  2.71 cm/m  RA Area:     26.40 cm LA Vol (A2C): 100.0 ml 45.88 ml/m RA Volume:   92.70 ml  42.53 ml/m LA Vol (A4C): 127.0 ml 58.27 ml/m  AORTIC VALVE LVOT Vmax:   69.20 cm/s LVOT Vmean:  49.500 cm/s LVOT VTI:    0.081 m AI PHT:      714 msec  AORTA Ao Root diam: 3.30 cm MITRAL VALVE               TRICUSPID VALVE MV Area (PHT): 4.86 cm    TR Peak grad:   18.3 mmHg MV Decel Time: 156 msec    TR Vmax:        214.00 cm/s MV E velocity: 74.60 cm/s MV A velocity: 29.60 cm/s  SHUNTS MV E/A ratio:  2.52        Systemic VTI:  0.08 m                            Systemic Diam: 2.30 cm Candee Furbish MD  Electronically signed by Candee Furbish MD Signature Date/Time: 04/28/2021/11:04:13 AM    Final    VAS US CAROTID  Result Date: 04/28/2021 Carotid Arterial Duplex Study Patient Name:  Jordan [name].  Date of Exam:   04/28/2021 Medical Rec #: [id number]              Accession #:    [id number] Date of Birth: Apr 15, 1946  Patient Gender: M Patient Age:   20Y Exam Location:  [facility] Procedure:      VAS US CAROTID Referring Phys: [id number] Rosalin Hawking --------------------------------------------------------------------------------  Indications:       CVA. Bilateral carotid bulb abnormality seen on CTA. Risk Factors:      Hypertension. Comparison Study:  No prior study Performing Technologist: Maudry Mayhew MHA, RDMS, RVT, RDCS  Examination Guidelines: A complete evaluation includes B-mode imaging, spectral Doppler, color Doppler, and power Doppler as needed of all accessible portions of each vessel. Bilateral testing is considered an integral part of a complete examination. Limited examinations for reoccurring indications may be performed as noted.  Right Carotid Findings: +----------+--------+--------+--------+------------------+--------+           PSV cm/sEDV cm/sStenosisPlaque DescriptionComments +----------+--------+--------+--------+------------------+--------+ CCA Prox  50      12                                         +----------+--------+--------+--------+------------------+--------+ CCA Distal47      15                                         +----------+--------+--------+--------+------------------+--------+ ICA Prox  20      6                                          +----------+--------+--------+--------+------------------+--------+ ICA Distal35      9                                          +----------+--------+--------+--------+------------------+--------+ ECA       49      8                                           +----------+--------+--------+--------+------------------+--------+ +----------+--------+-------+----------------+-------------------+           PSV cm/sEDV cmsDescribe        Arm Pressure (mmHG) +----------+--------+-------+----------------+-------------------+ [id number]             Multiphasic, WNL                    +----------+--------+-------+----------------+-------------------+ +---------+--------+--+--------+-+---------+ VertebralPSV cm/s14EDV cm/s5Antegrade +---------+--------+--+--------+-+---------+  Left Carotid Findings: +----------+--------+--------+--------+------------------+--------+           PSV cm/sEDV cm/sStenosisPlaque DescriptionComments +----------+--------+--------+--------+------------------+--------+ CCA Prox  45      13                                         +----------+--------+--------+--------+------------------+--------+ CCA Distal34      9                                          +----------+--------+--------+--------+------------------+--------+ ICA Prox  13      6                                          +----------+--------+--------+--------+------------------+--------+  ICA Distal31      11                                         +----------+--------+--------+--------+------------------+--------+ ECA       64      13                                         +----------+--------+--------+--------+------------------+--------+ +----------+--------+--------+----------------+-------------------+           PSV cm/sEDV cm/sDescribe        Arm Pressure (mmHG) +----------+--------+--------+----------------+-------------------+ [id number]              Multiphasic, WNL                    +----------+--------+--------+----------------+-------------------+ +---------+--------+--+--------+-+---------+ VertebralPSV cm/s19EDV cm/s8Antegrade +---------+--------+--+--------+-+---------+   Summary: Right Carotid:  The extracranial vessels were near-normal with only minimal wall                thickening or plaque. Left Carotid: The extracranial vessels were near-normal with only minimal wall               thickening or plaque. Vertebrals:  Bilateral vertebral arteries demonstrate antegrade flow. Subclavians: Normal flow hemodynamics were seen in bilateral subclavian              arteries. *See table(s) above for measurements and observations.     Preliminary    CT ANGIO HEAD CODE STROKE  Result Date: 04/28/2021 CLINICAL DATA:  Dizziness and hallucinations EXAM: CT ANGIOGRAPHY HEAD AND NECK TECHNIQUE: Multidetector CT imaging of the head and neck was performed using the standard protocol during bolus administration of intravenous contrast. Multiplanar CT image reconstructions and MIPs were obtained to evaluate the vascular anatomy. Carotid stenosis measurements (when applicable) are obtained utilizing NASCET criteria, using the distal internal carotid diameter as the denominator. CONTRAST:  50mL OMNIPAQUE IOHEXOL 350 MG/ML SOLN COMPARISON:  MRA brain 04/28/2021 FINDINGS: CTA NECK FINDINGS SKELETON: There is no bony spinal canal stenosis. No lytic or blastic lesion. OTHER NECK: Normal pharynx, larynx and major salivary glands. No cervical lymphadenopathy. Unremarkable thyroid gland. UPPER CHEST: No pneumothorax or pleural effusion. No nodules or masses. AORTIC ARCH: There is calcific atherosclerosis of the aortic arch. There is no aneurysm, dissection or hemodynamically significant stenosis of the visualized portion of the aorta. Conventional 3 vessel aortic branching pattern. The visualized proximal subclavian arteries are widely patent. CAROTID SYSTEMS: There are symmetric, curvilinear enhancement defects within both proximal internal carotid arteries. There is no stenosis of either carotid system. VERTEBRAL ARTERIES: Codominant configuration. Both origins are clearly patent. There is no dissection, occlusion or  flow-limiting stenosis to the skull base (V1-V3 segments). CTA HEAD FINDINGS POSTERIOR CIRCULATION: --Vertebral arteries: Normal V4 segments. --Inferior cerebellar arteries: Normal. --Basilar artery: Normal. --Superior cerebellar arteries: Normal. --Posterior cerebral arteries (PCA): Normal. The right PCA is partially supplied by a posterior communicating artery (p-comm). ANTERIOR CIRCULATION: --Intracranial internal carotid arteries: Moderate narrowing of the right ICA at the skull base. Mild bilateral atherosclerotic calcification. --Anterior cerebral arteries (ACA): Normal. Both A1 segments are present. Patent anterior communicating artery (a-comm). --Middle cerebral arteries (MCA): Normal. VENOUS SINUSES: As permitted by contrast timing, patent. ANATOMIC VARIANTS: None Review of the MIP images confirms the above findings. IMPRESSION: 1. No intracranial arterial occlusion or high-grade stenosis.  2. Symmetric, curvilinear enhancement defects within both proximal internal carotid arteries, favored to be artifacts related to flow turbulence. Carotid web is a secondary consideration, but the appearance is not typical. Dissection or thromboembolism are unlikely given the symmetry. 3. 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 Aortic Atherosclerosis (ICD10-I70.0). Electronically Signed   By: Ulyses Jarred M.D.   On: 04/28/2021 02:30   CT ANGIO NECK CODE STROKE  Result Date: 04/28/2021 CLINICAL DATA:  Dizziness and hallucinations EXAM: CT ANGIOGRAPHY HEAD AND NECK TECHNIQUE: Multidetector CT imaging of the head and neck was performed using the standard protocol during bolus administration of intravenous contrast. Multiplanar CT image reconstructions and MIPs were obtained to evaluate the vascular anatomy. Carotid stenosis measurements (when applicable) are obtained utilizing NASCET criteria, using the distal internal carotid diameter as the denominator. CONTRAST:  71mL OMNIPAQUE  IOHEXOL 350 MG/ML SOLN COMPARISON:  MRA brain 04/28/2021 FINDINGS: CTA NECK FINDINGS SKELETON: There is no bony spinal canal stenosis. No lytic or blastic lesion. OTHER NECK: Normal pharynx, larynx and major salivary glands. No cervical lymphadenopathy. Unremarkable thyroid gland. UPPER CHEST: No pneumothorax or pleural effusion. No nodules or masses. AORTIC ARCH: There is calcific atherosclerosis of the aortic arch. There is no aneurysm, dissection or hemodynamically significant stenosis of the visualized portion of the aorta. Conventional 3 vessel aortic branching pattern. The visualized proximal subclavian arteries are widely patent. CAROTID SYSTEMS: There are symmetric, curvilinear enhancement defects within both proximal internal carotid arteries. There is no stenosis of either carotid system. VERTEBRAL ARTERIES: Codominant configuration. Both origins are clearly patent. There is no dissection, occlusion or flow-limiting stenosis to the skull base (V1-V3 segments). CTA HEAD FINDINGS POSTERIOR CIRCULATION: --Vertebral arteries: Normal V4 segments. --Inferior cerebellar arteries: Normal. --Basilar artery: Normal. --Superior cerebellar arteries: Normal. --Posterior cerebral arteries (PCA): Normal. The right PCA is partially supplied by a posterior communicating artery (p-comm). ANTERIOR CIRCULATION: --Intracranial internal carotid arteries: Moderate narrowing of the right ICA at the skull base. Mild bilateral atherosclerotic calcification. --Anterior cerebral arteries (ACA): Normal. Both A1 segments are present. Patent anterior communicating artery (a-comm). --Middle cerebral arteries (MCA): Normal. VENOUS SINUSES: As permitted by contrast timing, patent. ANATOMIC VARIANTS: None Review of the MIP images confirms the above findings. IMPRESSION: 1. No intracranial arterial occlusion or high-grade stenosis. 2. Symmetric, curvilinear enhancement defects within both proximal internal carotid arteries, favored to be  artifacts related to flow turbulence. Carotid web is a secondary consideration, but the appearance is not typical. Dissection or thromboembolism are unlikely given the symmetry. 3. 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 Aortic Atherosclerosis (ICD10-I70.0). Electronically Signed   By: Ulyses Jarred M.D.   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  Stroke: Continue secondary stroke prophylaxis and Risk Factor Modification listed below:   Antiplatelet therapy:   Blood Pressure Management:  Continue current medication with prn's with permisive HTN per primary team Statin Agent:   Labs independently reviewed.  Records reviewed and summated above.  11. Does the need for close, 24 hr/day medical supervision in concert with the patient's rehab needs make it unreasonable for this patient to be served in a less intensive setting? Yes  12. Co-Morbidities requiring supervision/potential complications: Hep C (avoid hepatotoxic meds, monitor LFTs), prostate cancer, gout (monitor for flares), CAF/DVT (Eliquis when appropriate), substance abuse (counsel), leukocytosis (repeat labs, cont to monitor for signs and symptoms of  infection, further workup if indicated) 13. Due to safety, disease management and patient education, does the patient require 24 hr/day rehab nursing? Yes 14. Does the patient require coordinated care of a physician, rehab nurse, therapy disciplines of PT/OT to address physical and functional deficits in the context of the above medical diagnosis(es)? Yes Addressing deficits in the following areas: balance, endurance, locomotion, strength, transferring, bathing, dressing, grooming, toileting and psychosocial support 15. Can the patient actively participate in an intensive therapy program of at least 3 hrs of therapy per day at least 5 days per week?  Yes 16. The potential for patient to make measurable gains while on inpatient rehab is excellent 17. Anticipated functional outcomes upon discharge from inpatient rehab are supervision  with PT, supervision with OT, n/a with SLP. 18. Estimated rehab length of stay to reach the above functional goals is: 10-14 days. 19. Anticipated discharge destination: Home 20. Overall Rehab/Functional Prognosis: good  RECOMMENDATIONS: This patient's condition is appropriate for continued rehabilitative care in the following setting: CIR caregiver support available upon discharge. Patient has agreed to participate in recommended program. Yes Note that insurance prior authorization may be required for reimbursement for recommended care.  Comment: Rehab Admissions Coordinator to follow up.  I have personally performed a face to face diagnostic evaluation, including, but not limited to relevant history and physical exam findings, of this patient and developed relevant assessment and plan.  Additionally, I have reviewed and concur with the physician assistant's documentation above.   Delice Lesch, MD, ABPMR Bary Leriche, PA-C exam findings, of this patient and developed relevant assessment and plan.  Additionally, I have reviewed and concur with the physician assistant's documentation above.   Delice Lesch, MD, ABPMR Bary Leriche, PA-C

## 2021-05-02 DIAGNOSIS — I639 Cerebral infarction, unspecified: Secondary | ICD-10-CM

## 2021-05-02 LAB — COMPREHENSIVE METABOLIC PANEL
ALT: 27 U/L (ref 0–44)
AST: 36 U/L (ref 15–41)
Albumin: 2.3 g/dL — ABNORMAL LOW (ref 3.5–5.0)
Alkaline Phosphatase: 58 U/L (ref 38–126)
Anion gap: 8 (ref 5–15)
BUN: 18 mg/dL (ref 8–23)
CO2: 20 mmol/L — ABNORMAL LOW (ref 22–32)
Calcium: 8.9 mg/dL (ref 8.9–10.3)
Chloride: 107 mmol/L (ref 98–111)
Creatinine, Ser: 1.21 mg/dL (ref 0.61–1.24)
GFR, Estimated: >60 mL/min (ref >60)
Glucose, Bld: 98 mg/dL (ref 70–99)
Potassium: 3.9 mmol/L (ref 3.5–5.1)
Sodium: 135 mmol/L (ref 135–145)
Total Bilirubin: 1.1 mg/dL (ref 0.3–1.2)
Total Protein: 6.1 g/dL — ABNORMAL LOW (ref 6.5–8.1)

## 2021-05-02 LAB — CBC WITH DIFFERENTIAL/PLATELET
Abs Immature Granulocytes: 0.07 10*3/uL (ref 0.00–0.07)
Basophils Absolute: 0 10*3/uL (ref 0.0–0.1)
Basophils Relative: 0 %
Eosinophils Absolute: 0.1 10*3/uL (ref 0.0–0.5)
Eosinophils Relative: 1 %
HCT: 44.7 % (ref 39.0–52.0)
Hemoglobin: 15.3 g/dL (ref 13.0–17.0)
Immature Granulocytes: 1 %
Lymphocytes Relative: 17 %
Lymphs Abs: 1.3 10*3/uL (ref 0.7–4.0)
MCH: 30.1 pg (ref 26.0–34.0)
MCHC: 34.2 g/dL (ref 30.0–36.0)
MCV: 87.8 fL (ref 80.0–100.0)
Monocytes Absolute: 1 10*3/uL (ref 0.1–1.0)
Monocytes Relative: 12 %
Neutro Abs: 5.3 10*3/uL (ref 1.7–7.7)
Neutrophils Relative %: 69 %
Platelets: 167 10*3/uL (ref 150–400)
RBC: 5.09 MIL/uL (ref 4.22–5.81)
RDW: 14.2 % (ref 11.5–15.5)
WBC: 7.7 10*3/uL (ref 4.0–10.5)
nRBC: 0 % (ref 0.0–0.2)

## 2021-05-02 LAB — HCV RNA QUANT RFLX ULTRA OR GENOTYP
HCV RNA Qnt(log copy/mL): 4.911 log10 IU/mL
HepC Qn: 81500 IU/mL

## 2021-05-02 LAB — GLUCOSE, CAPILLARY
Glucose-Capillary: 97 mg/dL (ref 70–99)
Glucose-Capillary: 118 mg/dL — ABNORMAL HIGH (ref 70–99)
Glucose-Capillary: 99 mg/dL (ref 70–99)
Glucose-Capillary: 138 mg/dL — ABNORMAL HIGH (ref 70–99)

## 2021-05-02 LAB — HEPATITIS C GENOTYPE

## 2021-05-02 NOTE — Evaluation (Signed)
Speech Language Pathology Assessment and Plan  Patient Details  Name: Jordan [name]. MRN: [id number] Date of Birth: 1946/08/14  SLP Diagnosis: Cognitive Impairments  Rehab Potential: Good ELOS: 2.5 weeks    Today's Date: 05/02/2021 SLP Individual Time: 1020-1110 SLP Individual Time Calculation (min): 50 min   Hospital Problem: Principal Problem:   Thalamic stroke [facility])  Past Medical History:  Past Medical History:  Diagnosis Date  . Atrial fib/flutter, transient    Hx  . Atrial fibrillation (HCC)    coumadin  . Colon cancer [facility])    family hx  . Diverticulosis   . Gout   . Hemorrhoids   . Hepatitis C   . History of DVT (deep vein thrombosis)   . History of nuclear stress test 04/2002   exercise; normal study   . Hypertension   . 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 Personal history of colonic polyps   . Prostate cancer [facility])    radical prostatectomy    Past Surgical History:  Past Surgical History:  Procedure Laterality Date  . CARDIAC CATHETERIZATION  2007   no occlusive CAD  . COLONOSCOPY  [id number]   [name]  . PROSTATECTOMY    . PROSTATECTOMY  04-04   Dr. Risa Grill  . TRANSTHORACIC ECHOCARDIOGRAM  05/2006   EF ~47%; LV systolic function normal; mild focal basal septal hypertrophy; AV thickness mildly increased; LA mod-markedly dilate    Assessment / Plan / Recommendation  Jordan [name] is a 75 year old male with history of HTN, A fib/DVT- on coumadin, Hep C. non-obstructive HCM, substance abuse; was admitted on 04/27/2021 with weakness and dizziness times a few days. History taken from chart review and patient.UDS was negative and INR 1.3/subtherapeutic at admission. CT head showed subacute anterior right thalamic infarct. CTA head was negative for LVO but showed enhancement defects proximal ICA bilaterally. MRI/MRI brain showed subacute infarct in ventral and medial right thalamus with small amount of petechial  hemorrhage. Echocardiogram with EF of 45-50% with severe biatrial dilatation and mild AVR. Carotid Dopplers were negative for significant ICA stenosis.   Dr. Erlinda Hong felt that stroke was likely due to small vessel disease versus subtherapeutic INR and recommended starting aspirin with repeat CT in 5-7 days and transition to [place] if stable. He was also noted to have CKD with SCr 1.69 and polycythemia with Hgb 19.2-->improving with hydration. dehydration. Elevated BNP noted without signs of volume overload. Patient continues to be limited by diplopia with balance deficits and gait disorder. CIR was recommended due to functional decline. Please see preadmission assessment from earlier today as well.   Clinical Impression Patient presents with a mild-mod cognitive impairment impacting his memory, awareness, complex problem solving and time orientation. His visual deficits do likely play a role in some of his cognitive impairments as when looking at analog clock on wall, he read the time as "10 minutes to 6" as he had mixed up short and long hand. He then reported time of day as "evening" and said that he had eaten breakfast and lunch already. When completing mild complex problem solving tasks, he did not demonstrate adequate awareness to errors and for Reading Instructions subtest, he was correct on 7 of 10 questions. Patient did demonstrate some awareness to deficit of decreased delayed recall and when SLP reviewing areas to target in therapy, he was eager to work on 'money management' as this was particularly important to him. Patient lives by himself and  will require SLP intervention to achieve modified independent goals in order for him to be safe, demonstrate adequate awareness, problem solving and reasoning.  Skilled Therapeutic Interventions          Cognitive-linguistic evaluation, Cognistat  SLP Assessment  Patient will need skilled [place] [facility] during CIR admission     Recommendations  Recommendations for Other Services: Neuropsych consult Patient destination: Home Follow up Recommendations: Other (comment) (TBD pending progress) Equipment Recommended: None recommended by SLP    SLP Frequency 3 to 5 out of 7 days   SLP Duration  SLP Intensity  SLP Treatment/Interventions 2.5 weeks  Minumum of 1-2 x/day, 30 to 90 minutes  Cognitive remediation/compensation;Cueing hierarchy;Medication managment;Patient/family education;Functional tasks    Pain Pain Assessment Pain Scale: 0-10 Pain Score: 0-No pain  Prior Functioning Cognitive/Linguistic Baseline: Within functional limits Type of Home: Apartment  Lives With: Alone Available Help at Discharge: Available PRN/intermittently Education: high school 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)  SLP Evaluation Cognition Overall Cognitive Status: Impaired/Different from baseline Arousal/Alertness: Awake/alert Orientation Level: Oriented to person;Oriented to place;Oriented to situation;Other (comment) (oriented to year, month but not time of day(thought it was 6pm when it was 1030am)) Attention: Selective Selective Attention: Impaired Selective Attention Impairment: Verbal complex;Functional complex Memory: Impaired Memory Impairment: Retrieval deficit;Storage deficit;Other (comment) (recalled 1/4 words with category cue after 2 minute delay) Awareness: Impaired Awareness Impairment: Anticipatory impairment Problem Solving: Impaired Problem Solving Impairment: Functional complex;Verbal complex Executive Function: Self Monitoring Self Monitoring: Impaired Self Monitoring Impairment: Functional complex;Verbal complex Safety/Judgment: Impaired  Comprehension Auditory Comprehension Overall Auditory Comprehension: Appears within functional limits for tasks assessed Expression Expression Primary Mode of Expression: Verbal Verbal Expression Overall Verbal Expression: Appears  within functional limits for tasks assessed Oral Motor Oral Motor/Sensory Function Overall Oral Motor/Sensory Function: Within functional limits Motor Speech Overall Motor Speech: Appears within functional limits for tasks assessed  Care Tool Care Tool Cognition Expression of Ideas and Wants Expression of Ideas and Wants: Without difficulty (complex and basic) - expresses complex messages without difficulty and with speech that is clear and easy to understand   Understanding Verbal and Non-Verbal Content Understanding Verbal and Non-Verbal Content: Understands (complex and basic) - clear comprehension without cues or repetitions   Memory/Recall Ability *first 3 days only Memory/Recall Ability *first 3 days only: Current season;That he or she is in a hospital/hospital unit;Staff names and faces      Short Term Goals: Week 1: SLP Short Term Goal 1 (Week 1): Patient will perform simulated medication management/organization using pill box with minA cues. SLP Short Term Goal 2 (Week 1): Patient will perform money management/calculation tasks with minA for accuracy and awareness to errors. SLP Short Term Goal 3 (Week 1): Patient will demonstrate recall and effective use of learned strategies/exercises from PT, OT, ST sessions, with minA cues. SLP Short Term Goal 4 (Week 1): Patient will be able to recall and restate at least 75% of facts/information after SLP read aloud of short (3-4 sentence) paragraph, with modA cues.  Refer to Care Plan for Long Term Goals  Recommendations for other services: Neuropsych  Discharge Criteria: Patient will be discharged from SLP if patient refuses treatment 3 consecutive times without medical reason, if treatment goals not met, if there is a change in medical status, if patient makes no progress towards goals or if patient is discharged from hospital.  The above assessment, treatment plan, treatment alternatives and goals were discussed and mutually agreed  upon: by patient  Sonia Baller, MA, CCC-SLP Speech Therapy

## 2021-05-02 NOTE — Plan of Care (Signed)
  Problem: Consults Goal: RH STROKE PATIENT EDUCATION Description: See Patient Education module for education specifics  Outcome: Progressing   Problem: RH KNOWLEDGE DEFICIT Goal: RH STG INCREASE KNOWLEDGE OF HYPERTENSION Description: Patient will be able to manage hypertension and afib with medications with cues handouts Outcome: Progressing

## 2021-05-02 NOTE — Progress Notes (Signed)
PROGRESS NOTE   Subjective/Complaints:  Pt reports doing "pretty good"- LBM yesterday and denies pain.  Per SLP, doesn't have SLP order- placed order for cognition, not swallowing issues.   ROS:  Pt denies SOB, abd pain, CP, N/V/C/D, and vision changes   Objective:   No results found. Recent Labs    05/02/21 0506  WBC 7.7  HGB 15.3  HCT 44.7  PLT 167   Recent Labs    05/02/21 0506  NA 135  K 3.9  CL 107  CO2 20*  GLUCOSE 98  BUN 18  CREATININE 1.21  CALCIUM 8.9    Intake/Output Summary (Last 24 hours) at 05/02/2021 1229 Last data filed at 05/02/2021 0824 Gross per 24 hour  Intake 120 ml  Output 400 ml  Net -280 ml        Physical Exam: Vital Signs Blood pressure 137/73, pulse 70, temperature 98.2 F (36.8 C), temperature source Oral, resp. rate 18, height 6' (1.829 m), weight 95.8 kg, SpO2 97 %.    General: awake, alert,slightly confused, sitting up in bed; NAD HENT: conjugate gaze; oropharynx moist CV: regular rate, but irregular rhythm; no JVD Pulmonary: CTA B/L; no W/R/R- good air movement GI: soft, NT, ND, (+)BS Psychiatric: slightly confused, but polite Neurological: alert  Musculoskeletal:        General: Swelling present.     Cervical back: Normal range of motion and neck supple.     Comments: 2+ pedal edema left ankle/distal tibia (gout flare 2 weeks ago). 1+ right pedal edema. No tenderness in extremities   Skin:    General: Skin is warm and dry.  Neurological:     Mental Status: He is alert and oriented to person, place, and time.     Comments: Alert. Mild left facial weakness without dysarthria. Dysconjugate gaze. Diplopia improving.  Motor: RUE/RLE: 5/5 proximal distal LUE/LLE: 4+/5 proximal to distal Bilateral upper extremity dysmetria, improving    Assessment/Plan: 1. Functional deficits which require 3+ hours per day of interdisciplinary therapy in a comprehensive  inpatient rehab setting.  Physiatrist is providing close team supervision and 24 hour management of active medical problems listed below.  Physiatrist and rehab team continue to assess barriers to discharge/monitor patient progress toward functional and medical goals  Care Tool:  Bathing              Bathing assist       Upper Body Dressing/Undressing Upper body dressing        Upper body assist      Lower Body Dressing/Undressing Lower body dressing            Lower body assist       Toileting Toileting    Toileting assist Assist for toileting: Set up assist Assistive Device Comment: urinal   Transfers Chair/bed transfer  Transfers assist           Locomotion Ambulation   Ambulation assist              Walk 10 feet activity   Assist           Walk 50 feet activity   Assist  Walk 150 feet activity   Assist           Walk 10 feet on uneven surface  activity   Assist           Wheelchair     Assist               Wheelchair 50 feet with 2 turns activity    Assist            Wheelchair 150 feet activity     Assist          Blood pressure 137/73, pulse 70, temperature 98.2 F (36.8 C), temperature source Oral, resp. rate 18, height 6' (1.829 m), weight 95.8 kg, SpO2 97 %.  Medical Problem List and Plan: 1.  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             -patient may shower             -ELOS/Goals: 9-13 days/Supervision/Mod I             Admit to CIR  5/7- first day of PT, OT and SLP- SLP ordered 2.  Antithrombotics: -DVT/anticoagulation:  Pharmaceutical: Lovenox             -antiplatelet therapy: ASA for a week-->repeat CT head on Monday 3. Pain Management: N/A 4. Mood: LCSW to follow for evaluation and support.               -antipsychotic agents: N/a 5. Neuropsych: This patient is capable of making decisions on  own behalf. 6. Skin/Wound Care: Routine pressure-relief measures. 7. Fluids/Electrolytes/Nutrition: Monitor intake/output.  Check labs in AM. 8.  HTN: Monitor blood pressures twice daily. Continue Losartan and coreg.              Monitor with increased mobility 9.  Mild elevation in white count: Monitor for fevers and other signs of infection.               CBC ordered for tomorrow  5/7- WBC down to 7.7- con't to monitor 10.  CKD: SCR- 1.69 at admission and trending to baseline?               CMP ordered for tomorrow  5/7- Cr down to 1.21- con't to monitor 11.  Gout hx: Continue allopurinol             Monitor for flares 12. Afib             Monitor with increased exertion             DOAC next week if CT stable    LOS: 1 days A FACE TO FACE EVALUATION WAS PERFORMED  Shaya Altamura 05/02/2021, 12:29 PM

## 2021-05-02 NOTE — Plan of Care (Signed)
  Problem: RH Balance Goal: LTG Patient will maintain dynamic standing with ADLs (OT) Description: LTG:  Patient will maintain dynamic standing balance with assist during activities of daily living (OT)  Flowsheets (Taken 05/02/2021 1627) LTG: Pt will maintain dynamic standing balance during ADLs with: Supervision/Verbal cueing   Problem: Sit to Stand Goal: LTG:  Patient will perform sit to stand in prep for activites of daily living with assistance level (OT) Description: LTG:  Patient will perform sit to stand in prep for activites of daily living with assistance level (OT) Flowsheets (Taken 05/02/2021 1627) LTG: PT will perform sit to stand in prep for activites of daily living with assistance level: Supervision/Verbal cueing   Problem: RH Eating Goal: LTG Patient will perform eating w/assist, cues/equip (OT) Description: LTG: Patient will perform eating with assist, with/without cues using equipment (OT) Flowsheets (Taken 05/02/2021 1627) LTG: Pt will perform eating with assistance level of: Independent with assistive device    Problem: RH Grooming Goal: LTG Patient will perform grooming w/assist,cues/equip (OT) Description: LTG: Patient will perform grooming with assist, with/without cues using equipment (OT) Flowsheets (Taken 05/02/2021 1627) LTG: Pt will perform grooming with assistance level of: Set up assist    Problem: RH Bathing Goal: LTG Patient will bathe all body parts with assist levels (OT) Description: LTG: Patient will bathe all body parts with assist levels (OT) Flowsheets (Taken 05/02/2021 1627) LTG: Pt will perform bathing with assistance level/cueing: Supervision/Verbal cueing   Problem: RH Dressing Goal: LTG Patient will perform upper body dressing (OT) Description: LTG Patient will perform upper body dressing with assist, with/without cues (OT). Flowsheets (Taken 05/02/2021 1627) LTG: Pt will perform upper body dressing with assistance level of: Set up assist Goal: LTG  Patient will perform lower body dressing w/assist (OT) Description: LTG: Patient will perform lower body dressing with assist, with/without cues in positioning using equipment (OT) Flowsheets (Taken 05/02/2021 1627) LTG: Pt will perform lower body dressing with assistance level of: Supervision/Verbal cueing   Problem: RH Toileting Goal: LTG Patient will perform toileting task (3/3 steps) with assistance level (OT) Description: LTG: Patient will perform toileting task (3/3 steps) with assistance level (OT)  Flowsheets (Taken 05/02/2021 1627) LTG: Pt will perform toileting task (3/3 steps) with assistance level: Supervision/Verbal cueing   Problem: RH Toilet Transfers Goal: LTG Patient will perform toilet transfers w/assist (OT) Description: LTG: Patient will perform toilet transfers with assist, with/without cues using equipment (OT) Flowsheets (Taken 05/02/2021 1627) LTG: Pt will perform toilet transfers with assistance level of: Supervision/Verbal cueing   Problem: RH Tub/Shower Transfers Goal: LTG Patient will perform tub/shower transfers w/assist (OT) Description: LTG: Patient will perform tub/shower transfers with assist, with/without cues using equipment (OT) Flowsheets (Taken 05/02/2021 1627) LTG: Pt will perform tub/shower stall transfers with assistance level of: Supervision/Verbal cueing

## 2021-05-02 NOTE — Evaluation (Signed)
Occupational Therapy Assessment and Plan  Patient Details  Name: Isais Klipfel. MRN: [id number] Date of Birth: 02/24/1946  OT Diagnosis: abnormal posture, acute pain, cognitive deficits, disturbance of vision, hemiplegia affecting dominant side, muscle weakness (generalized) and swelling of limb Rehab Potential: Rehab Potential (ACUTE ONLY): Excellent ELOS: 2 to 2.5 weeks   Today's Date: 05/02/2021 OT Individual Time: 7341-9379 OT Individual Time Calculation (min): 61 min     Hospital Problem: Principal Problem:   Thalamic stroke [facility])   Past Medical History:  Past Medical History:  Diagnosis Date  . Atrial fib/flutter, transient    Hx  . Atrial fibrillation (HCC)    coumadin  . Colon cancer [facility])    family hx  . Diverticulosis   . Gout   . Hemorrhoids   . Hepatitis C   . History of DVT (deep vein thrombosis)   . History of nuclear stress test 04/2002   exercise; normal study   . Hypertension   . 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 Personal history of colonic polyps   . Prostate cancer [facility])    radical prostatectomy    Past Surgical History:  Past Surgical History:  Procedure Laterality Date  . CARDIAC CATHETERIZATION  2007   no occlusive CAD  . COLONOSCOPY  [id number]   [name]  . PROSTATECTOMY    . PROSTATECTOMY  04-04   Dr. Risa Grill  . TRANSTHORACIC ECHOCARDIOGRAM  05/2006   EF ~32%; LV systolic function normal; mild focal basal septal hypertrophy; AV thickness mildly increased; LA mod-markedly dilate    Assessment & Plan Clinical Impression: Candace Ramus. Wortmann is a 75 year old male with history of HTN, A fib/DVT- on coumadin, Hep C.  non-obstructive HCM, substance abuse; was admitted on 04/27/2021 with weakness and dizziness times a few days.  History taken from chart review and patient. UDS was negative and INR 1.3/subtherapeutic at admission.  CT head showed subacute anterior right thalamic infarct.  CTA head was negative  for LVO but showed enhancement defects proximal ICA bilaterally.  MRI/MRI brain showed subacute infarct in ventral and medial right thalamus with small amount of petechial hemorrhage.  Echocardiogram with EF of 45-50% with severe biatrial dilatation and mild AVR.  Carotid Dopplers were negative for significant ICA stenosis.   Dr. Erlinda Hong felt that stroke was likely due to small vessel disease versus subtherapeutic INR and recommended starting aspirin with repeat CT in 5-7 days and transition to [place] if stable. He was also noted to have CKD with SCr 1.69 and polycythemia with Hgb 19.2--> improving with hydration. dehydration.  Elevated BNP noted without signs of volume overload. Patient continues to be limited by diplopia with balance deficits and gait disorder.  CIR was recommended due to functional decline. Please see preadmission assessment from earlier today as well.   Patient currently requires mod-max with basic self-care skills secondary to muscle weakness, decreased cardiorespiratoy endurance, decreased coordination, diplopia, left inattention, safety awareness deficits, short term memory deficits, and decreased postural control, decreased balance strategies.  Prior to hospitalization, patient could complete BADLs with modified independent  or independent.  Patient will benefit from skilled intervention to increase independence with basic self-care skills prior to discharge home alone.  Anticipate patient will require 24 hour supervision and follow up home health.  OT - End of Session Endurance Deficit: Yes Endurance Deficit Description: Pt commenting throughout activity how much energy he was exerting with simple tasks, requested to return to bed at  close of session due to fatigue OT Assessment Rehab Potential (ACUTE ONLY): Excellent OT Barriers to Discharge: Lack of/limited family support OT Patient demonstrates impairments in the following area(s):  Balance;Cognition;Edema;Endurance;Motor;Pain;Perception;Safety;Vision OT Basic ADL's Functional Problem(s): Eating OT Advanced ADL's Functional Problem(s): Simple Meal Preparation OT Transfers Functional Problem(s): Toilet;Tub/Shower OT Additional Impairment(s): None OT Plan OT Intensity: Minimum of 1-2 x/day, 45 to 90 minutes OT Frequency: 5 out of 7 days OT Duration/Estimated Length of Stay: 2 to 2.5 weeks OT Treatment/Interventions: Balance/vestibular training;DME/adaptive equipment instruction;Patient/family education;Therapeutic Activities;Cognitive remediation/compensation;Psychosocial support;Therapeutic Exercise;Self Care/advanced ADL retraining;Functional mobility training;Community reintegration;UE/LE Strength taining/ROM;Discharge planning;Neuromuscular re-education;UE/LE Coordination activities;Disease mangement/prevention;Pain management;Visual/perceptual remediation/compensation OT Self Feeding Anticipated Outcome(s): Mod I OT Basic Self-Care Anticipated Outcome(s): Supervision OT Toileting Anticipated Outcome(s): Supervision OT Bathroom Transfers Anticipated Outcome(s): Supervision OT Recommendation Recommendations for Other Services: Therapeutic Recreation consult Therapeutic Recreation Interventions: Kitchen group (adapted to address visual deficits) Patient destination: Home Follow Up Recommendations: Home health OT Equipment Recommended: To be determined   OT Evaluation Precautions/Restrictions  Precautions Precautions: Fall Precaution Comments: diplopia Vital Signs Therapy Vitals Temp: 97.9 F (36.6 C) Temp Source: Oral Pulse Rate: 85 Resp: 18 BP: 131/83 Patient Position (if appropriate): Lying Oxygen Therapy SpO2: 100 % O2 Device: Room Air Pain Pain Assessment Pain Scale: 0-10 Pain Score: 0-No pain Home Living/Prior Functioning Home Living Family/patient expects to be discharged to:: Private residence Living Arrangements: Alone Available Help at  Discharge: Family,Friend(s),Available 24 hours/day Type of Home: Apartment Home Access: Level entry Home Layout: One level Bathroom Shower/Tub: Tub/shower unit,Curtain Biochemist, clinical: Standard Bathroom Accessibility: Yes  Lives With: Alone IADL History Homemaking Responsibilities: Yes Meal Prep Responsibility:  (Pt reports being completely independent with IADL responsibilities PTA, enjoys cooking. 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) Vision Baseline Vision/History: Wears glasses Wears Glasses: Reading only Patient Visual Report: Diplopia Vision Assessment?: Vision impaired- to be further tested in functional context Convergence: Impaired - to be further tested in functional context Depth Perception: Overshoots;Undershoots Additional Comments: Impaired depth perception noted during functional reaching, increased time for pt to scan to locate what he needed Perception  Perception: Impaired Inattention/Neglect: Does not attend to left visual field (mild) Praxis Praxis: Intact Cognition Overall Cognitive Status: Impaired/Different from baseline Arousal/Alertness: Awake/alert Orientation Level: Person;Place;Situation Person: Oriented Place: Oriented Situation: Oriented Year: 2022 (Per pt "2022?") Month: May Day of Week: Correct Memory: Impaired Memory Impairment: Retrieval deficit;Storage deficit;Other (comment) (recalled 1/4 words with category cue after 2 minute delay) Immediate Memory Recall: Sock;Bed Memory Recall Sock: Without Cue Memory Recall Blue: Without Cue Memory Recall Bed: Without Cue Attention:  Selective Selective Attention: Impaired Selective Attention Impairment: Verbal complex;Functional complex Awareness: Impaired Awareness Impairment: Anticipatory impairment Problem Solving: Impaired Problem Solving Impairment: Functional complex;Verbal complex Executive Function: Self Monitoring Self Monitoring: Impaired Self Monitoring Impairment: Functional complex;Verbal complex Safety/Judgment: Impaired Comments: Pt states he plans to go home to cook vs have family assist him with cooking after we discussed his visual deficits Sensation Sensation Light Touch: Appears Intact Coordination Gross Motor Movements are Fluid and Coordinated: Yes Fine Motor Movements are Fluid and Coordinated: No (impaired [place], ?coordination issue vs vision) Finger Nose Finger Test: Decreased accuracy Rt, WNL Lt Heel Shin Test: slowed L vs R Motor  Motor Motor: Hemiplegia;Abnormal postural alignment and control Motor - Skilled Clinical Observations: Lt sided weakness, LE>UE  Trunk/Postural Assessment  Cervical Assessment Cervical Assessment: Exceptions to [facility] (forward head) Thoracic Assessment Thoracic Assessment: Exceptions to [facility] (kyphotic) Lumbar Assessment Lumbar Assessment: Exceptions to [facility] (posterior pelvic tilt) Postural Control Postural Control: Deficits on evaluation (limited in standing, relies heavily on AD or external supports for stability during functional tasks) Trunk Control: moderately flexed trunk w/R lean in standing Righting Reactions: delayed L Protective Responses: decreased L  Balance Balance Balance Assessed: Yes Static Sitting Balance Static Sitting - Balance Support: Feet supported Static Sitting - Level of Assistance: 5: Stand by assistance Dynamic Sitting Balance Dynamic Sitting - Balance Support: Feet supported;During functional activity Dynamic Sitting - Level of Assistance: 4: Min assist Dynamic Sitting - Balance Activities: Lateral lean/weight shifting;Forward  lean/weight shifting;Reaching across midline Sitting balance - Comments: EOB, scooting, reaching forward/laterally Static Standing Balance Static Standing - Balance Support: Bilateral upper extremity supported;No upper extremity supported Static Standing - Level of Assistance: 4: Min assist Dynamic Standing Balance Dynamic Standing - Balance Support: During functional activity;No upper extremity supported Dynamic Standing - Level of Assistance: 3: Mod assist Dynamic Standing - Balance Activities: Forward lean/weight shifting;Lateral lean/weight shifting (toilet transfer, ambulating without AD) Extremity/Trunk Assessment RUE Assessment RUE Assessment: Within Functional Limits Active Range of Motion (AROM) Comments: general restrictions/age related changes/flexed posture limit overhead reach/flex/abd of shoulders General Strength Comments: 4+/5 grossly LUE Assessment LUE Assessment: Within Functional Limits General Strength Comments: 4-/5 grossly  Care Tool Care Tool Self Care Eating        Oral Care    Oral Care Assist Level: Contact Guard/Toucning assist (standing)    Bathing   Body parts bathed by patient: Left arm;Right arm;Chest;Abdomen;Front perineal area;Buttocks;Right upper leg;Left upper leg;Right lower leg;Left lower leg;Face     Assist Level: Contact Guard/Touching assist    Upper Body Dressing(including orthotics)   What is the patient wearing?: Pull over shirt   Assist Level: Minimal Assistance - Patient > 75%    Lower Body Dressing (excluding footwear)   What is the patient wearing?: Incontinence brief;Pants Assist for lower body dressing: Maximal Assistance - Patient 25 - 49%    Putting on/Taking off footwear   What is the patient wearing?: Non-skid slipper socks Assist for footwear: Total Assistance - Patient < 25%       Care Tool Toileting Toileting activity   Assist for toileting: Moderate Assistance - Patient 50 - 74%     Care Tool Bed  Mobility Roll left and right activity   Roll left and right assist level: Independent    Sit to lying activity   Sit to lying assist level: Contact Guard/Touching assist    Lying to sitting edge of bed activity   Lying to sitting edge of bed assist level: Contact Guard/Touching assist     Care Tool Transfers Sit to stand transfer   Sit to stand assist level: Minimal Assistance - Patient > 75%    Chair/bed transfer   Chair/bed transfer assist level: Moderate Assistance - Patient 50 - 74%     Toilet transfer   Assist Level: Moderate Assistance - Patient 50 - 74%     Care Tool Cognition Expression of Ideas and Wants Expression of Ideas and Wants: Without difficulty (complex and basic) - expresses complex messages without difficulty and with speech that is clear and easy to understand   Understanding Verbal and Non-Verbal Content Understanding Verbal and Non-Verbal Content: Understands (complex and basic) -  clear comprehension without cues or repetitions   Memory/Recall Ability *first 3 days only Memory/Recall Ability *first 3 days only: Current season;That he or she is in a hospital/hospital unit;Staff names and faces    Refer to Care Plan for Long Term Goals  SHORT TERM GOAL WEEK 1 OT Short Term Goal 1 (Week 1): Pt will complete toilet transfer and Min A with LRAD OT Short Term Goal 2 (Week 1): Pt will locate ADL items needed for bathing with setup assist using compensatory strategies as needed OT Short Term Goal 3 (Week 1): Pt will complete oral care while standing at the sink without relying on forearm support for upright stability  Recommendations for other services: Surveyor, mining group   Skilled Therapeutic Intervention Skilled OT session completed with focus on initial evaluation, education on OT role/POC, and establishment of patient-centered goals.   Pt greeted in the w/c with some c/o pain in the Lt ankle. Per pt, pain manageable for tx without  additional interventions to address. He completed toileting (using standard toilet), bathing (shower level sit<stand), dressing (sit<stand from chair in bathroom), and oral care/grooming tasks (standing at the sink) during session. All functional transfers completed with either HHA and Mod A (pt very forward stooped, small steps) or using RW with Min A (pt still with kyphotic posture). His biggest limitation was his vision, pt often overshooting or undershooting for items, trouble finding ADL items. He reports diplopia and stated that glasses helped him, however just wore the glasses on top of his head. Noted heavy reliance on the sink for standing support. Pt also required some cues for safety awareness, decreased insight into deficits noted with pt stating that he planned to cook at home vs have family assist him. Pt returned to bed at end of session, all needs within reach and bed alarm set prior to OT departure.   ADL ADL Eating: Not assessed Grooming: Contact guard Where Assessed-Grooming: Standing at sink Upper Body Bathing: Setup Where Assessed-Upper Body Bathing: Shower Lower Body Bathing: Contact guard Where Assessed-Lower Body Bathing: Shower Upper Body Dressing: Minimal assistance Where Assessed-Upper Body Dressing: Chair Lower Body Dressing: Maximal assistance Where Assessed-Lower Body Dressing: Chair Toileting: Moderate assistance Where Assessed-Toileting: Glass blower/designer: Moderate assistance Toilet Transfer Method: Ambulating (without AD) Toilet Transfer Equipment: Energy manager: Moderate assistance Social research officer, government Method: Ambulating (without AD) Mobility      Discharge Criteria: Patient will be discharged from OT if patient refuses treatment 3 consecutive times without medical reason, if treatment goals not met, if there is a change in medical status, if patient makes no progress towards goals or if patient is discharged from  hospital.  The above assessment, treatment plan, treatment alternatives and goals were discussed and mutually agreed upon: by patient  Skeet Simmer 05/02/2021, 4:21 PM

## 2021-05-02 NOTE — Evaluation (Signed)
Physical Therapy Assessment and Plan  Patient Details  Name: Jordan [name]. MRN: [id number] Date of Birth: 07-May-1946  PT Diagnosis: Hemiparesis dominant Rehab Potential: Good ELOS: 17-21days   Today's Date: 05/02/2021 PT Individual Time: 0900-1000 PT Individual Time Calculation (min): 60 min    Hospital Problem: Principal Problem:   Thalamic stroke [facility])   Past Medical History:  Past Medical History:  Diagnosis Date  . Atrial fib/flutter, transient    Hx  . Atrial fibrillation (HCC)    coumadin  . Colon cancer [facility])    family hx  . Diverticulosis   . Gout   . Hemorrhoids   . Hepatitis C   . History of DVT (deep vein thrombosis)   . History of nuclear stress test 04/2002   exercise; normal study   . Hypertension   . 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[place])    history of   . Personal history of colonic polyps   . Prostate cancer [facility])    radical prostatectomy    Past Surgical History:  Past Surgical History:  Procedure Laterality Date  . CARDIAC CATHETERIZATION  2007   no occlusive CAD  . COLONOSCOPY  [id number]   [name]  . PROSTATECTOMY    . PROSTATECTOMY  04-04   Dr. Risa Grill  . TRANSTHORACIC ECHOCARDIOGRAM  05/2006   EF ~72%; LV systolic function normal; mild focal basal septal hypertrophy; AV thickness mildly increased; LA mod-markedly dilate    Assessment & Plan Clinical Impression: Jordan [name] is a 75 year old male with history of HTN, A fib/DVT- on coumadin, Hep C.  non-obstructive HCM, substance abuse; was admitted on 04/27/2021 with weakness and dizziness times a few days.  History taken from chart review and patient. UDS was negative and INR 1.3/subtherapeutic at admission.  CT head showed subacute anterior right thalamic infarct.  CTA head was negative for LVO but showed enhancement defects proximal ICA bilaterally.  MRI/MRI brain showed subacute infarct in ventral and medial right thalamus with small amount of petechial hemorrhage.   Echocardiogram with EF of 45-50% with severe biatrial dilatation and mild AVR.  Carotid Dopplers were negative for significant ICA stenosis.   Dr. Erlinda Hong felt that stroke was likely due to small vessel disease versus subtherapeutic INR and recommended starting aspirin with repeat CT in 5-7 days and transition to [place] if stable. He was also noted to have CKD with SCr 1.69 and polycythemia with Hgb 19.2--> improving with hydration. dehydration.  Elevated BNP noted without signs of volume overload. Patient continues to be limited by diplopia with balance deficits and gait disorder.  CIR was recommended due to functional decline.  Patient currently requires mod with mobility secondary to muscle weakness and muscle joint tightness, decreased cardiorespiratoy endurance, impaired timing and sequencing, unbalanced muscle activation and decreased coordination, decreased visual acuity, decreased visual perceptual skills, decreased visual motor skills and diplopia, decreased midline orientation and decreased attention to left, decreased attention, decreased awareness and decreased safety awareness and impaired balance.  Prior to hospitalization, patient was independent  with mobility and lived with Alone in a [place] home.  Home access is  Level entry.  Patient will benefit from skilled PT intervention to maximize safe functional mobility, minimize fall risk and decrease caregiver burden for planned discharge home alone.  Anticipate patient will benefit from follow up [place] at discharge.  PT - End of Session Activity Tolerance: Tolerates 30+ min activity with multiple rests Endurance Deficit: Yes Endurance Deficit Description: requires rest breaks between activities PT  Assessment Rehab Potential (ACUTE/IP ONLY): Good PT Barriers to Discharge: Decreased caregiver support PT Barriers to Discharge Comments: L inattention, cognitive/safety awareness PT Patient demonstrates impairments in the following area(s):  Balance;Edema;Endurance;Motor;Pain;Perception;Safety PT Transfers Functional Problem(s): Bed Mobility;Bed to Chair;Car;Furniture PT Locomotion Functional Problem(s): Ambulation;Stairs PT Plan PT Intensity: Minimum of 1-2 x/day ,45 to 90 minutes PT Frequency: 5 out of 7 days PT Duration Estimated Length of Stay: 17-21days PT Treatment/Interventions: Ambulation/gait training;DME/adaptive equipment instruction;Neuromuscular re-education;Psychosocial support;Stair training;UE/LE Strength taining/ROM;Wheelchair propulsion/positioning;Balance/vestibular training;Discharge planning;Pain management;Therapeutic Activities;UE/LE Coordination activities;Cognitive remediation/compensation;Disease management/prevention;Functional mobility training;Patient/family education;Therapeutic Exercise;Visual/perceptual remediation/compensation PT Transfers Anticipated Outcome(s): supervision PT Locomotion Anticipated Outcome(s): supervision PT Recommendation Follow Up Recommendations: Home health PT;24 hour supervision/assistance Patient destination: Home Equipment Recommended: To be determined Equipment Details: uses crutches when gout flares   PT Evaluation Precautions/Restrictions Precautions Precautions: Fall Precaution Comments: diplopia, decreased clarity Restrictions Weight Bearing Restrictions: No General   Vital Signs  Pain Pain Assessment Pain Scale: 0-10 Pain Score: 0-No pain Home Living/Prior Functioning Home Living Available Help at Discharge: Available PRN/intermittently Type of Home: Apartment Home Access: Level entry Home Layout: One level Bathroom Shower/Tub: Tub/shower unit;Curtain Biochemist, clinical: Standard Bathroom Accessibility: Yes  Lives With: Alone Prior Function Level of Independence: Independent with basic ADLs;Independent with gait;Independent with homemaking with ambulation;Independent with transfers Vision/Perception  Vision - Assessment Eye Alignment: Impaired  (comment) Ocular Range of Motion: Impaired-to be further tested in functional context Alignment/Gaze Preference: Head turned (prefers R environment, mild tendency to maintain R rotation) Tracking/Visual Pursuits: Impaired - to be further tested in functional context Saccades: Additional eye shifts occurred during testing;Additional head turns occurred during testing;Decreased speed of saccadic movement;Impaired - to be further tested in functional context  Cognition Overall Cognitive Status: Impaired/Different from baseline Arousal/Alertness: Awake/alert Orientation Level: Oriented X4 Attention: Selective Selective Attention: Impaired Sensation Sensation Light Touch: Appears Intact Proprioception: Appears Intact Coordination Gross Motor Movements are Fluid and Coordinated: Yes Fine Motor Movements are Fluid and Coordinated: No (difficulty appears related to diplopia vs coordination) Finger Nose Finger Test: R=L mild tremor Heel Shin Test: slowed L vs R Motor  Motor Motor: Hemiplegia Motor - Skilled Clinical Observations: LUE and LLE weakness, L ankle also limited by Gout   Trunk/Postural Assessment  Cervical Assessment Cervical Assessment: Exceptions to [facility] (forward head, prefers milt rotation to R) Thoracic Assessment Thoracic Assessment: Exceptions to [facility] (mild R lean in standing, age related changes) Lumbar Assessment Lumbar Assessment:  (post pelvic rotation in standing w/flexed trunk) Postural Control Postural Control: Deficits on evaluation Trunk Control: moderately flexed trunk w/R lean in standing Righting Reactions: delayed L Protective Responses: decreased L  Balance Balance Balance Assessed: Yes Static Sitting Balance Static Sitting - Balance Support: Feet supported Static Sitting - Level of Assistance: 5: Stand by assistance Dynamic Sitting Balance Dynamic Sitting - Balance Support: Feet supported;During functional activity Dynamic Sitting - Level of  Assistance: 4: Min assist Dynamic Sitting - Balance Activities: Lateral lean/weight shifting;Forward lean/weight shifting;Reaching across midline Sitting balance - Comments: EOB, scooting, reaching forward/laterally Static Standing Balance Static Standing - Balance Support: Bilateral upper extremity supported;No upper extremity supported Static Standing - Level of Assistance: 4: Min assist Dynamic Standing Balance Dynamic Standing - Balance Support: Bilateral upper extremity supported (bilat HHA w/gait) Dynamic Standing - Level of Assistance: 3: Mod assist Dynamic Standing - Balance Activities: Forward lean/weight shifting;Lateral lean/weight shifting Extremity Assessment  RUE Assessment RUE Assessment: Within Functional Limits Active Range of Motion (AROM) Comments: general restrictions/age related changes/flexed posture limit overhead reach/flex/abd of shoulders LUE Assessment LUE Assessment:  Exceptions to [facility] General Strength Comments: grossly 3+-4- throughout RLE Assessment RLE Assessment: Within Functional Limits LLE Assessment LLE Assessment: Exceptions to [facility] General Strength Comments: hip grossly 3-/5, knee 4/5, ankle NT due to pain d/t gout  Care Tool Care Tool Bed Mobility Roll left and right activity   Roll left and right assist level: Independent    Sit to lying activity   Sit to lying assist level: Contact Guard/Touching assist    Lying to sitting edge of bed activity   Lying to sitting edge of bed assist level: Contact Guard/Touching assist     Care Tool Transfers Sit to stand transfer   Sit to stand assist level: Minimal Assistance - Patient > 75%    Chair/bed transfer   Chair/bed transfer assist level: Moderate Assistance - Patient 50 - 74%     Physiological scientist transfer assist level: Moderate Assistance - Patient 50 - 74%      Care Tool Locomotion Ambulation   Assist level: 2 helpers Assistive device: Hand held assist Max  distance: 40 ft  Walk 10 feet activity   Assist level: 2 helpers Assistive device: Hand held assist   Walk 50 feet with 2 turns activity Walk 50 feet with 2 turns activity did not occur: Safety/medical concerns      Walk 150 feet activity Walk 150 feet activity did not occur: Safety/medical concerns      Walk 10 feet on uneven surfaces activity   Assist level: Moderate Assistance - Patient - 50 - 74% Assistive device: Hand held assist  Stairs   Assist level: Minimal Assistance - Patient > 75% Stairs assistive device: 2 hand rails Max number of stairs: 4  Walk up/down 1 step activity   Walk up/down 1 step (curb) assist level: Minimal Assistance - Patient > 75% Walk up/down 1 step or curb assistive device: 1 hand rail    Walk up/down 4 steps activity Walk up/down 4 steps assist level: Minimal Assistance - Patient > 75% Walk up/down 4 steps assistive device: 2 hand rails  Walk up/down 12 steps activity Walk up/down 12 steps activity did not occur: Safety/medical concerns      Pick up small objects from floor Pick up small object from the floor (from standing position) activity did not occur: Safety/medical concerns      Wheelchair Will patient use wheelchair at discharge?: No          Wheel 50 feet with 2 turns activity      Wheel 150 feet activity        Refer to Care Plan for Long Term Goals  SHORT TERM GOAL WEEK 1 PT Short Term Goal 1 (Week 1): Pt will ambulate 50f w/LRAD and min assist PT Short Term Goal 2 (Week 1): bed to/from wc w/LRAD and min assist PT Short Term Goal 3 (Week 1): Pt will perform car transfer w/safe technique w/LRAD and min assist, minimal cueing for safety.  Recommendations for other services: None   Skilled Therapeutic Intervention  Evaluation completed (see details above and below) with education on PT POC and goals and individual treatment initiated with focus on functional mobility/transfers, LE strength, dynamic standing  balance/coordination, ambulation, stair navigation, simulated car transfers, and improved endurance with activity Pt   oriented to rehab unit.  Discussed process of daily scheduling and receiving schedule, purpose  And team conference schedule and D/c planning.  Pt provided w/ 18x16 wheelchair and foam  cushion and adjustments made to promote optimal seating posture and pressure distribution.   Pt initially supine and agreeable to session.   Discussed having family bring clothes for IPR stay for use in therapy.  Pt agreed.  Pt supine to sit w/rail and min assist.  Sit to stand w/min assist and stand pivot transfer to wc no AD w/min to mod assist, flexed posture, c/o r ankle discomfort, noted edema, states due to gout.   Pt transported to gym. Gait w/+2 HHA x 43f  and verbal cues for posture, step to gait pattern, antalgic gt, leans mildly R and forward.   Stairs - ascended/descended 4 steps w/2 rails, cues for sequencing and safety, alternated between step over step and step to gait pattern.  Slow progression, min assist. Car transfer - initially performs w/very poor safety awareness, attempts to grab car door and step in w/LLE balancing w/door on RLE.  Therapist intervenes for safety, educated pt on safe technique then pt completes w/min assist.  Loses balance to L w/lifting RLE into car, mod assist for balance. Ramp: ascended/descended w/mod assist for balance, antalgic gait, ant lean/forward flexion. Pt transported back to room at end of session.  Educated on use of alarm belt for safety and NT notified no belt/box in room. Pt left oob in wc w/NT agreeing to obtail alarm belt and needs in reach  Bed Mobility Bed Mobility: Rolling Right;Rolling Left;Supine to Sit;Sit to Supine Rolling Right: Independent Rolling Left: Independent Right Sidelying to Sit: Contact Guard/Touching assist (uses rail) Supine to Sit: Contact Guard/Touching assist Sit to Supine: Contact Guard/Touching  assist Transfers Transfers: Sit to Stand;Stand to Sit;Stand Pivot Transfers Sit to Stand: Minimal Assistance - Patient > 75% Stand to Sit: Minimal Assistance - Patient > 75% Stand Pivot Transfers: Moderate Assistance - Patient 50 - 74% Stand Pivot Transfer Details: Tactile cues for sequencing;Tactile cues for weight shifting;Verbal cues for precautions/safety;Manual facilitation for weight shifting;Verbal cues for technique Transfer (Assistive device): None Locomotion  Gait Ambulation: Yes Gait Assistance: 2 Helpers Gait Distance (Feet): 40 Feet Assistive device: 2 person hand held assist Gait Assistance Details: mild r lean, antalgic gait w/L ankle/gout pain, moderately flexed posture Gait Gait: Yes Gait Pattern: Impaired Gait Pattern: Antalgic;Trunk flexed;Decreased weight shift to left;Step-to pattern Gait velocity: decreased Stairs / Additional Locomotion Stairs: Yes Stairs Assistance: Minimal Assistance - Patient > 75% (cues for sequencing) Stair Management Technique: Two rails Number of Stairs: 4 Height of Stairs: 6 Ramp: Moderate Assistance - Patient 50 - 74% Curb: Moderate Assistance - Patient 50 - 74% Wheelchair Mobility Wheelchair Mobility: No   Discharge Criteria: Patient will be discharged from PT if patient refuses treatment 3 consecutive times without medical reason, if treatment goals not met, if there is a change in medical status, if patient makes no progress towards goals or if patient is discharged from hospital.  The above assessment, treatment plan, treatment alternatives and goals were discussed and mutually agreed upon: by patient  BJerrilyn Cairo5/06/2021, 12:50 PM

## 2021-05-03 LAB — GLUCOSE, CAPILLARY
Glucose-Capillary: 91 mg/dL (ref 70–99)
Glucose-Capillary: 116 mg/dL — ABNORMAL HIGH (ref 70–99)
Glucose-Capillary: 101 mg/dL — ABNORMAL HIGH (ref 70–99)
Glucose-Capillary: 132 mg/dL — ABNORMAL HIGH (ref 70–99)

## 2021-05-03 NOTE — Plan of Care (Signed)
  Problem: Consults Goal: RH STROKE PATIENT EDUCATION Description: See Patient Education module for education specifics  Outcome: Progressing   Problem: RH KNOWLEDGE DEFICIT Goal: RH STG INCREASE KNOWLEDGE OF HYPERTENSION Description: Patient will be able to manage hypertension and afib with medications with cues handouts Outcome: Progressing   

## 2021-05-04 ENCOUNTER — Inpatient Hospital Stay (HOSPITAL_COMMUNITY): Payer: Medicare Other

## 2021-05-04 DIAGNOSIS — I4891 Unspecified atrial fibrillation: Secondary | ICD-10-CM

## 2021-05-04 DIAGNOSIS — N1831 Chronic kidney disease, stage 3a: Secondary | ICD-10-CM

## 2021-05-04 DIAGNOSIS — I10 Essential (primary) hypertension: Secondary | ICD-10-CM

## 2021-05-04 LAB — BASIC METABOLIC PANEL
Anion gap: 6 (ref 5–15)
BUN: 16 mg/dL (ref 8–23)
CO2: 23 mmol/L (ref 22–32)
Calcium: 9 mg/dL (ref 8.9–10.3)
Chloride: 108 mmol/L (ref 98–111)
Creatinine, Ser: 1.4 mg/dL — ABNORMAL HIGH (ref 0.61–1.24)
GFR, Estimated: 53 mL/min — ABNORMAL LOW (ref >60)
Glucose, Bld: 92 mg/dL (ref 70–99)
Potassium: 4.4 mmol/L (ref 3.5–5.1)
Sodium: 137 mmol/L (ref 135–145)

## 2021-05-04 LAB — CBC
HCT: 47.2 % (ref 39.0–52.0)
Hemoglobin: 15.8 g/dL (ref 13.0–17.0)
MCH: 30.1 pg (ref 26.0–34.0)
MCHC: 33.5 g/dL (ref 30.0–36.0)
MCV: 89.9 fL (ref 80.0–100.0)
Platelets: 208 10*3/uL (ref 150–400)
RBC: 5.25 MIL/uL (ref 4.22–5.81)
RDW: 14 % (ref 11.5–15.5)
WBC: 6.9 10*3/uL (ref 4.0–10.5)
nRBC: 0 % (ref 0.0–0.2)

## 2021-05-04 LAB — GLUCOSE, CAPILLARY: Glucose-Capillary: 121 mg/dL — ABNORMAL HIGH (ref 70–99)

## 2021-05-04 MED ORDER — EXERCISE FOR HEART AND HEALTH BOOK
Freq: Once | Status: AC
  Filled 2021-05-04: qty 1

## 2021-05-04 MED ORDER — BLOOD PRESSURE CONTROL BOOK
Freq: Once | Status: AC
  Filled 2021-05-04: qty 1

## 2021-05-04 MED ORDER — HYDRALAZINE HCL 10 MG PO TABS
10.0000 mg | ORAL_TABLET | Freq: Three times a day (TID) | ORAL | Status: DC
  Administered 2021-05-04 – 2021-05-07 (×9): 10 mg via ORAL
  Filled 2021-05-04 (×10): qty 1

## 2021-05-04 NOTE — Progress Notes (Signed)
Patient ID: Ayomikun G Klooster Jr., male   DOB: 06/03/1946, 75 y.o.   MRN: [id number] Met with the patient to review role of the nurse CM and initiate patient education on secondary stroke risks including HTN, HLD (LDL 103) and A-Fib on coumadin. Reviewed low albumin level and protein rich food choices along with A1C 5.6 and dietary modifications/eating meals as pt noted he would often not eat for long periods and then get a muffin with coffee. Reportedly lost ~25# during the pandemic. Patient given handouts on exercise (previous cyclist) and blood pressure management with DASH diet, cooking with less salt and other nutritional handouts. Continue to follow along to discharge to address educational needs and collaborate with the SW to facilitate preparation for discharge. No other questions/concerns noted at present. Sharp, Deborah B  

## 2021-05-04 NOTE — Progress Notes (Signed)
[place] Individual Statement of Services  Patient Name:  Jordan [name].  Date:  05/04/2021  Welcome to the [place].  Our goal is to provide you with an individualized program based on your diagnosis and situation, designed to meet your specific needs.  With this comprehensive rehabilitation program, you will be expected to participate in at least 3 hours of rehabilitation therapies Monday-Friday, with modified therapy programming on the weekends.  Your rehabilitation program will include the following services:  Physical Therapy (PT), Occupational Therapy (OT), Speech Therapy (ST), 24 hour per day rehabilitation nursing, Neuropsychology, Care Coordinator, Rehabilitation Medicine, Nutrition Services and Pharmacy Services  Weekly team conferences will be held on Wednesday to discuss your progress.  Your Inpatient Rehabilitation Care Coordinator will talk with you frequently to get your input and to update you on team discussions.  Team conferences with you and your family in attendance may also be held.  Expected length of stay: 2-2.5 weeks  Overall anticipated outcome: independent-supervision level  Depending on your progress and recovery, your program may change. Your Inpatient Rehabilitation Care Coordinator will coordinate services and will keep you informed of any changes. Your Inpatient Rehabilitation Care Coordinator's name and contact numbers are listed  below.  The following services may also be recommended but are not provided by the [place] will be made to provide these services after discharge if needed.  Arrangements include referral to agencies that provide these services.  Your insurance has been verified to be:  [employer] Your primary doctor is:  Amie Critchley  Pertinent  information will be shared with your doctor and your insurance company.  Inpatient Rehabilitation Care Coordinator:  Ovidio Kin, [place] or (Emilia Beck  Information discussed with and copy given to patient by: Elease Hashimoto, 05/04/2021, 10:02 AM

## 2021-05-04 NOTE — Progress Notes (Signed)
Inpatient Rehabilitation Care Coordinator Assessment and Plan Patient Details  Name: Jordan [name]. MRN: [id number] Date of Birth: 27-Aug-1946  Today's Date: 05/04/2021  Hospital Problems: Principal Problem:   Thalamic stroke [facility])  Past Medical History:  Past Medical History:  Diagnosis Date  . Atrial fib/flutter, transient    Hx  . Atrial fibrillation (HCC)    coumadin  . Colon cancer [facility])    family hx  . Diverticulosis   . Gout   . Hemorrhoids   . Hepatitis C   . History of DVT (deep vein thrombosis)   . History of nuclear stress test 04/2002   exercise; normal study   . Hypertension   . 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[place])    history of   . Personal history of colonic polyps   . Prostate cancer [facility])    radical prostatectomy    Past Surgical History:  Past Surgical History:  Procedure Laterality Date  . CARDIAC CATHETERIZATION  2007   no occlusive CAD  . COLONOSCOPY  [id number]   [name]  . PROSTATECTOMY    . PROSTATECTOMY  04-04   Dr. Risa Grill  . TRANSTHORACIC ECHOCARDIOGRAM  05/2006   EF ~92%; LV systolic function normal; mild focal basal septal hypertrophy; AV thickness mildly increased; LA mod-markedly dilate   Social History:  reports that he has never smoked. He has never used smokeless tobacco. He reports current alcohol use of about 3.0 standard drinks of alcohol per week. He reports that he does not use drugs.  Family / Support Systems Marital Status: Divorced Patient Roles: Parent,Other (Comment) (sibling) Children: Son local Other Supports: Education administrator [phone] Anticipated Caregiver: Self Ability/Limitations of Caregiver: Sister elderly and son works Building control surveyor Availability: Intermittent Family Dynamics: Sister and son live around the corner from him, so can check on him. But he plans to be independent and go back to living alone. He feels he has adequate supports via family and friends.  Social  History Preferred language: English Religion: Baptist Cultural Background: No issues Education: HS Read: Yes Write: Yes Employment Status: Retired Public relations account executive Issues: No issues Guardian/Conservator: None-according to MD pt is capable of making his own decisions while here   Abuse/Neglect Abuse/Neglect Assessment Can Be Completed: Yes Physical Abuse: Denies Verbal Abuse: Denies Sexual Abuse: Denies Exploitation of patient/patient's resources: Denies Self-Neglect: Denies  Emotional Status Pt's affect, behavior and adjustment status: Pt is able to explain his stroke and feels his main issue is his vision, which is getting better each day. He feels he can reach independent level by the time he leaves here. Recent Psychosocial Issues: other health issues Psychiatric History: No history deferred depression screen due to coping appropriately and verbalizing his feelings and concerns. May benefit from seeing neuro-psych while here wil get input from team Substance Abuse History: Pt feels social not an issue with this  Patient / Family Perceptions, Expectations & Goals Pt/Family understanding of illness & functional limitations: Pt and sister can explain his stroke and deficits-he talks with the MD and feels he has a good understanding of his deficits and plan going forward. Premorbid pt/family roles/activities: Dad, retiree, brother, friend, etc Anticipated changes in roles/activities/participation: resume Pt/family expectations/goals: Pt states: " I hope to be moving around and my vision better by the time I leave here."  [employer]: None Premorbid Home Care/DME Agencies: None Transportation available at discharge: Sister and son if pt is not able to drive when he is discharged Resource referrals recommended: Neuropsychology  Discharge Planning Living Arrangements: Alone Support Systems: Children,Other relatives Type of Residence: Private  residence Insurance Resources: Multimedia programmer (specify) ([place]) Museum/gallery curator Resources: Radio broadcast assistant Screen Referred: No Living Expenses: Education officer, community Management: Patient Does the patient have any problems obtaining your medications?: No Home Management: Self Patient/Family Preliminary Plans: Return to his apartment and be able to care for himself, his sister and son will come by and check on him. Some goals are superivison level will need to see if family can assist with these. Care Coordinator Barriers to Discharge: Decreased caregiver support Care Coordinator Barriers to Discharge Comments: Does not have 24/7 care Care Coordinator Anticipated Follow Up Needs: HH/OP  Clinical Impression Pleasant gentleman who is motivated to do well and recover from this stroke-his vision is getting better and he is encouraged by this. His sister and son will be checking on him but he will be alone, so needs to be mod/i at discharge to be safe home alone. Will ask MD to address driving with pt. Follow for discharge needs.  Jordan [name] 05/04/2021, 10:12 AM

## 2021-05-04 NOTE — Progress Notes (Signed)
Occupational Therapy Session Note  Patient Details  Name: Jordan [name]. MRN: [id number] Date of Birth: August 11, 1946  Today's Date: 05/04/2021 OT Individual Time: 3007-6226 OT Individual Time Calculation (min): 70 min    Short Term Goals: Week 1:  OT Short Term Goal 1 (Week 1): Pt will complete toilet transfer and Min A with LRAD OT Short Term Goal 2 (Week 1): Pt will locate ADL items needed for bathing with setup assist using compensatory strategies as needed OT Short Term Goal 3 (Week 1): Pt will complete oral care while standing at the sink without relying on forearm support for upright stability  Skilled Therapeutic Interventions/Progress Updates:    Pt resting in bed upon arrival and agreeable to getting OOB. Pt declined shower this morning. Squat pivot transfer to w/c with min A. Dressing with sit<>stand from w/c at bedside. UB with supervision. LB with min A. Pt transitioned to to gym. Amb with RW-17' and 23' with min A. Max verbal for safety awareness. Pt performed stand>half squat with RW 8x3 and EOM>squat with no UE support 8x1 and 2x5. Pt returned to room and remained in w/c with all needs within reach. Belt alarm activated. Pt reports diplopia is improving.  Therapy Documentation Precautions:  Precautions Precautions: Fall Precaution Comments: diplopia Restrictions Weight Bearing Restrictions: No  Pain:  Pt denies pain this morning   Therapy/Group: Individual Therapy  Leroy Libman 05/04/2021, 12:05 PM

## 2021-05-04 NOTE — Progress Notes (Signed)
Physical Therapy Session Note  Patient Details  Name: Jordan [name]. MRN: [id number] Date of Birth: [birth year]  Today's Date: 05/04/2021 PT Individual Time: 0092-3300 PT Individual Time Calculation (min): 42 min   Short Term Goals: Week 1:  PT Short Term Goal 1 (Week 1): Pt will ambulate 49ft w/LRAD and min assist PT Short Term Goal 2 (Week 1): bed to/from wc w/LRAD and min assist PT Short Term Goal 3 (Week 1): Pt will perform car transfer w/safe technique w/LRAD and min assist, minimal cueing for safety.  Skilled Therapeutic Interventions/Progress Updates:   Received pt sitting in WC, pt agreeable to therapy, and denied any pain during session. Session with emphasis on functional mobility/transfers, generalized strengthening, dynamic standing balance/coordination, gait training, and improved activity tolerance. Pt transported to dayroom in [facility] total A and ambulated 102ft x 2 trials with RW and min A. Pt demonstrated narrow BOS when turning and decreased bilateral foot clearance but no LOB noted. Raised height of RW for improved positioning. Pt performed seated BLE strengthening on Kinetron at 20 cm/sec for 1 minute x 4 trials with emphasis on quad/glute strengthening with cues for anterior weight shifting to engage core. Worked on blocked practice sit<>stands with RW and CGA x5 reps. Pt unable to stand without use of UEs. Pt transported back to room in [facility] total A and ambulated 45ft with RW and CGA to bed and transferred sit<>supine with supervision. Concluded session with pt semi-reclined in bed, needs within reach, and bed alarm on.   Therapy Documentation Precautions:  Precautions Precautions: Fall Precaution Comments: diplopia Restrictions Weight Bearing Restrictions: No  Therapy/Group: Individual Therapy Alfonse Alpers PT, DPT   05/04/2021, 7:37 AM

## 2021-05-04 NOTE — Progress Notes (Signed)
Speech Language Pathology Daily Session Note  Patient Details  Name: Jordan [name]. MRN: [id number] Date of Birth: 1946/07/20  Today's Date: 05/04/2021 SLP Individual Time: 1300-1400 SLP Individual Time Calculation (min): 60 min  Short Term Goals: Week 1: SLP Short Term Goal 1 (Week 1): Patient will perform simulated medication management/organization using pill box with minA cues. SLP Short Term Goal 2 (Week 1): Patient will perform money management/calculation tasks with minA for accuracy and awareness to errors. SLP Short Term Goal 3 (Week 1): Patient will demonstrate recall and effective use of learned strategies/exercises from PT, OT, ST sessions, with minA cues. SLP Short Term Goal 4 (Week 1): Patient will be able to recall and restate at least 75% of facts/information after SLP read aloud of short (3-4 sentence) paragraph, with modA cues.  Skilled Therapeutic Interventions: Skilled SLP intervention focused on cognition. Pt completed money calculation subtest in ALFA assessment with min A verbal cues for error awareness. Pt made minor errors with calculation and increased  Attention and acccuracy as task continued. He completed simple problem solving task with calendar organization with moderate visual and verbal cues. Short term memory task with 3-4 sentence pararaphs completed with min A verbal cues for recall of minor details.  Cont with therapy per plan of care.   Pain Pain Assessment Pain Scale: Faces Faces Pain Scale: No hurt  Therapy/Group: Individual Therapy  Darrol Poke Canio Winokur 05/04/2021, 1:53 PM

## 2021-05-04 NOTE — Progress Notes (Signed)
PROGRESS NOTE   Subjective/Complaints: Patient seen sitting up in bed this AM. He states he slept well overnight.  He sates he had a good weekend.  He notes improvement in vision.   ROS: Denies CP, SOB, N/V/D  Objective:   CT HEAD WO CONTRAST  Result Date: 05/04/2021 CLINICAL DATA:  Stroke, follow-up EXAM: CT HEAD WITHOUT CONTRAST TECHNIQUE: Contiguous axial images were obtained from the base of the skull through the vertex without intravenous contrast. COMPARISON:  Recent CT and MR imaging FINDINGS: Brain: Persistent hypoattenuation involving the medial right thalamus. There is no acute intracranial hemorrhage. No significant mass effect. No new loss of gray-white differentiation. There is no extra-axial fluid collection. Ventricles and sulci are stable in size and configuration. Vascular: No new findings. Skull: Calvarium is unremarkable. Sinuses/Orbits: No acute finding. Other: None. IMPRESSION: Evolving medial right thalamus infarct. No apparent hemorrhage by CT. Electronically Signed   By: Macy Mis M.D.   On: 05/04/2021 08:21   Recent Labs    05/02/21 0506 05/04/21 0655  WBC 7.7 6.9  HGB 15.3 15.8  HCT 44.7 47.2  PLT 167 208   Recent Labs    05/02/21 0506 05/04/21 0655  NA 135 137  K 3.9 4.4  CL 107 108  CO2 20* 23  GLUCOSE 98 92  BUN 18 16  CREATININE 1.21 1.40*  CALCIUM 8.9 9.0    Intake/Output Summary (Last 24 hours) at 05/04/2021 1011 Last data filed at 05/04/2021 0856 Gross per 24 hour  Intake 840 ml  Output 1400 ml  Net -560 ml        Physical Exam: Vital Signs Blood pressure (!) 170/75, pulse 66, temperature 97.9 F (36.6 C), resp. rate 18, height 6' (1.829 m), weight 95.8 kg, SpO2 100 %. Constitutional: No distress . Vital signs reviewed. HENT: Normocephalic.  Atraumatic. Eyes: EOMI. No discharge. Cardiovascular: No JVD.  Irregularly irregular. Respiratory: Normal effort.  No stridor.   Bilateral clear to auscultation. GI: Non-distended.  BS +. Skin: Warm and dry.  Intact. Psych: Normal mood.  Normal behavior. Musc: LE edema. No tenderness in extremities. Neuro: Alert Dysconjugate gaze. Diplopia, improving Motor: RUE/RLE: 5/5 proximal distal LUE/LLE: 4+/5 proximal to distal Bilateral upper extremity dysmetria, improving   Assessment/Plan: 1. Functional deficits which require 3+ hours per day of interdisciplinary therapy in a comprehensive inpatient rehab setting.  Physiatrist is providing close team supervision and 24 hour management of active medical problems listed below.  Physiatrist and rehab team continue to assess barriers to discharge/monitor patient progress toward functional and medical goals  Care Tool:  Bathing    Body parts bathed by patient: Left arm,Right arm,Chest,Abdomen,Front perineal area,Buttocks,Right upper leg,Left upper leg,Right lower leg,Left lower leg,Face         Bathing assist Assist Level: Contact Guard/Touching assist     Upper Body Dressing/Undressing Upper body dressing   What is the patient wearing?: Pull over shirt    Upper body assist Assist Level: Minimal Assistance - Patient > 75%    Lower Body Dressing/Undressing Lower body dressing      What is the patient wearing?: Incontinence brief,Pants     Lower body assist Assist for lower body  dressing: Maximal Assistance - Patient 25 - 49%     Toileting Toileting    Toileting assist Assist for toileting: Minimal Assistance - Patient > 75% Assistive Device Comment: urinal   Transfers Chair/bed transfer  Transfers assist     Chair/bed transfer assist level: Moderate Assistance - Patient 50 - 74%     Locomotion Ambulation   Ambulation assist      Assist level: 2 helpers Assistive device: Hand held assist Max distance: 40 ft   Walk 10 feet activity   Assist     Assist level: 2 helpers Assistive device: Hand held assist   Walk 50 feet  activity   Assist Walk 50 feet with 2 turns activity did not occur: Safety/medical concerns         Walk 150 feet activity   Assist Walk 150 feet activity did not occur: Safety/medical concerns         Walk 10 feet on uneven surface  activity   Assist     Assist level: Moderate Assistance - Patient - 50 - 74% Assistive device: Hand held assist   Wheelchair     Assist Will patient use wheelchair at discharge?: No             Wheelchair 50 feet with 2 turns activity    Assist            Wheelchair 150 feet activity     Assist          Medical Problem List and Plan: 1.  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  Continue CIR  Repeat head CT today 2.  Antithrombotics: -DVT/anticoagulation:  Pharmaceutical: Lovenox             -antiplatelet therapy: ASA for a week 3. Pain Management: N/A 4. Mood: LCSW to follow for evaluation and support.              -antipsychotic agents: N/a 5. Neuropsych: This patient is capable of making decisions on  own behalf. 6. Skin/Wound Care: Routine pressure-relief measures. 7. Fluids/Electrolytes/Nutrition: Monitor intake/output. 8.  HTN: Monitor blood pressures twice daily. Continue Losartan and coreg.              Hydralizine 10 TID started on 5/9  Monitor with increased mobility 9.  Mild elevation in white count: Resolved  Monitor for fevers and other signs of infection.   10.  CKD IIIa:   Cr. 1.40 on 5/9  Cont to monitor 11.  Gout hx: Continue allopurinol             Monitor for flares 12. Afib             Monitor with increased exertion             DOAC this week if CT stable   LOS: 3 days A FACE TO FACE EVALUATION WAS PERFORMED  Lourine Alberico [name] 05/04/2021, 10:11 AM

## 2021-05-05 DIAGNOSIS — I4821 Permanent atrial fibrillation: Secondary | ICD-10-CM

## 2021-05-05 MED ORDER — APIXABAN 5 MG PO TABS
5.0000 mg | ORAL_TABLET | Freq: Two times a day (BID) | ORAL | Status: DC
  Administered 2021-05-05 – 2021-05-19 (×29): 5 mg via ORAL
  Filled 2021-05-05 (×29): qty 1

## 2021-05-05 NOTE — Progress Notes (Signed)
PROGRESS NOTE   Subjective/Complaints: Patient seen sitting up in bed this AM, working with therapies.  He states he slept well overnight.  Discussed CT results with patient.   ROS: Denies CP, SOB, N/V/D  Objective:   CT HEAD WO CONTRAST  Result Date: 05/04/2021 CLINICAL DATA:  Stroke, follow-up EXAM: CT HEAD WITHOUT CONTRAST TECHNIQUE: Contiguous axial images were obtained from the base of the skull through the vertex without intravenous contrast. COMPARISON:  Recent CT and MR imaging FINDINGS: Brain: Persistent hypoattenuation involving the medial right thalamus. There is no acute intracranial hemorrhage. No significant mass effect. No new loss of gray-white differentiation. There is no extra-axial fluid collection. Ventricles and sulci are stable in size and configuration. Vascular: No new findings. Skull: Calvarium is unremarkable. Sinuses/Orbits: No acute finding. Other: None. IMPRESSION: Evolving medial right thalamus infarct. No apparent hemorrhage by CT. Electronically Signed   By: Macy Mis M.D.   On: 05/04/2021 08:21   Recent Labs    05/04/21 0655  WBC 6.9  HGB 15.8  HCT 47.2  PLT 208   Recent Labs    05/04/21 0655  NA 137  K 4.4  CL 108  CO2 23  GLUCOSE 92  BUN 16  CREATININE 1.40*  CALCIUM 9.0    Intake/Output Summary (Last 24 hours) at 05/05/2021 0902 Last data filed at 05/05/2021 0850 Gross per 24 hour  Intake 600 ml  Output 850 ml  Net -250 ml        Physical Exam: Vital Signs Blood pressure (!) 163/102, pulse 67, temperature 97.9 F (36.6 C), temperature source Oral, resp. rate 19, height 6' (1.829 m), weight 95.8 kg, SpO2 98 %. Constitutional: No distress . Vital signs reviewed. HENT: Normocephalic.  Atraumatic. Eyes: No discharge bilaterally. Cardiovascular: No JVD.  Irregularly irregular.  Respiratory: Normal effort.  No stridor.  Bilateral clear to auscultation. GI: Non-distended.  BS  +. Skin: Warm and dry.  Intact. Psych: Normal mood.  Normal behavior. Musc: No edema in extremities.  No tenderness in extremities. Neuro: Alert Dysconjugate gaze. Diplopia, stable Motor: RUE/RLE: 5/5 proximal distal LUE/LLE: 4+/5 proximal to distal Bilateral upper extremity dysmetria, improving   Assessment/Plan: 1. Functional deficits which require 3+ hours per day of interdisciplinary therapy in a comprehensive inpatient rehab setting.  Physiatrist is providing close team supervision and 24 hour management of active medical problems listed below.  Physiatrist and rehab team continue to assess barriers to discharge/monitor patient progress toward functional and medical goals  Care Tool:  Bathing    Body parts bathed by patient: Left arm,Right arm,Chest,Abdomen,Front perineal area,Buttocks,Right upper leg,Left upper leg,Right lower leg,Left lower leg,Face         Bathing assist Assist Level: Contact Guard/Touching assist     Upper Body Dressing/Undressing Upper body dressing   What is the patient wearing?: Pull over shirt    Upper body assist Assist Level: Supervision/Verbal cueing    Lower Body Dressing/Undressing Lower body dressing      What is the patient wearing?: Underwear/pull up,Pants     Lower body assist Assist for lower body dressing: Minimal Assistance - Patient > 75%     Chartered loss adjuster  assist Assist for toileting: Minimal Assistance - Patient > 75% Assistive Device Comment: urinal   Transfers Chair/bed transfer  Transfers assist     Chair/bed transfer assist level: Minimal Assistance - Patient > 75%     Locomotion Ambulation   Ambulation assist      Assist level: Minimal Assistance - Patient > 75% Assistive device: Walker-rolling Max distance: 81ft   Walk 10 feet activity   Assist     Assist level: Minimal Assistance - Patient > 75% Assistive device: Walker-rolling   Walk 50 feet activity   Assist Walk  50 feet with 2 turns activity did not occur: Safety/medical concerns         Walk 150 feet activity   Assist Walk 150 feet activity did not occur: Safety/medical concerns         Walk 10 feet on uneven surface  activity   Assist     Assist level: Moderate Assistance - Patient - 50 - 74% Assistive device: Hand held assist   Wheelchair     Assist Will patient use wheelchair at discharge?: No             Wheelchair 50 feet with 2 turns activity    Assist            Wheelchair 150 feet activity     Assist          Medical Problem List and Plan: 1.  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  Continue CIR  Repeat head CT on 5/9, showing evolution of infarct.  2.  Antithrombotics: -DVT/anticoagulation:  Pharmaceutical: Eliquis             -antiplatelet therapy: Eliquis 3. Pain Management: N/A 4. Mood: LCSW to follow for evaluation and support.              -antipsychotic agents: N/a 5. Neuropsych: This patient is capable of making decisions on  own behalf. 6. Skin/Wound Care: Routine pressure-relief measures. 7. Fluids/Electrolytes/Nutrition: Monitor intake/output. 8.  HTN: Monitor blood pressures twice daily. Continue Losartan and coreg.              Hydralizine 10 TID started on 5/9  Slightly labile, but appears to be improving, will consider further medication adjustments tomorrow if necessary  Monitor with increased mobility 9.  Mild elevation in white count: Resolved  Monitor for fevers and other signs of infection.   10.  CKD IIIa:   Cr. 1.40 on 5/9  Cont to monitor 11.  Gout hx: Continue allopurinol             Monitor for flares 12. Afib             Monitor with increased exertion             Eliquis started   LOS: 4 days A FACE TO FACE EVALUATION WAS PERFORMED  Jordan [name] Jordan [name] 05/05/2021, 9:02 AM

## 2021-05-05 NOTE — Progress Notes (Signed)
Physical Therapy Session Note  Patient Details  Name: Jordan [name]. MRN: [id number] Date of Birth: [birth year]  Today's Date: 05/05/2021 PT Individual Time: 1101-1158 PT Individual Time Calculation (min): 57 min   Short Term Goals: Week 1:  PT Short Term Goal 1 (Week 1): Pt will ambulate 85ft w/LRAD and min assist PT Short Term Goal 2 (Week 1): bed to/from wc w/LRAD and min assist PT Short Term Goal 3 (Week 1): Pt will perform car transfer w/safe technique w/LRAD and min assist, minimal cueing for safety.  Skilled Therapeutic Interventions/Progress Updates:    Pt greeted supine in bed at start of session. Agreeable to PT tx. Reports mild L ankle pain which he contributes to acute on chronic gout. Pt reports he manages this at home with ice and elevation - this was provided at end of session. Brief discussion regarding stroke recovery, PT POC, and goals set to maximize indep and function. Pt agreeable and appreciative. Supine<>sit with CGA with use of bed features. Forward scooting at EOB with CGA. Squat<>pivot transfer with minA from EOB to w/c and w/c transport to main rehab gym for time management. Gait training 2x58ft with minA and RW - slightly antalgic with LLE gout pain and step-to gait pattern. Cues needed for keeping body within walker frame and improving bilateral foot clearance due to short shuffling steps. Completed alternating toe taps on 5inch platform with RW support and CGA, 2x15. 2nd set introduced 4# ankle weights. Increased difficulty rising LLE > RLE but capable. No formal LOB or knee buckling noted either while completing these. Worked on lateral stepping  L<>R along length of  // bars with BUE support to bars and CGA for steadying. Cues needed for increasing step length and step height. Pt wheeled back to room, stand<>pivot with minA and bed rail back to EOB, poor controlled descent during transfer. Able to complete sit>supine with bed features and CGA. Bed alarm set, needs  in reach at end of session. Pt very appreciative.  Therapy Documentation Precautions:  Precautions Precautions: Fall Precaution Comments: diplopia Restrictions Weight Bearing Restrictions: No General:     Therapy/Group: Individual Therapy  Hazael Olveda P Aleeza [place] PT 05/05/2021, 11:28 AM

## 2021-05-05 NOTE — Progress Notes (Signed)
Occupational Therapy Session Note  Patient Details  Name: Jordan [name]. MRN: [id number] Date of Birth: 07-15-1946  Today's Date: 05/05/2021 OT Individual Time: 9509-3267 OT Individual Time Calculation (min): 56 min    Short Term Goals: Week 1:  OT Short Term Goal 1 (Week 1): Pt will complete toilet transfer and Min A with LRAD OT Short Term Goal 2 (Week 1): Pt will locate ADL items needed for bathing with setup assist using compensatory strategies as needed OT Short Term Goal 3 (Week 1): Pt will complete oral care while standing at the sink without relying on forearm support for upright stability  Skilled Therapeutic Interventions/Progress Updates:    Pt received in room in bed and consented to OT tx. Pt trained in functional transfers, req min A for SPT with RW with cuing for proper positioning of RW an hand placement during transitions for maximal safety. Pt taken down to therapy gym, instructed in BUE strengthening HEP to increase strength and activity tolerance for ADLs and functional transfers, see below for details. Pt required increased time for exercises with frequent rest breaks as pt becomes fatigued very easily.  After tx, pt left up in w/c with sister and nurse tech present.  Therapy Documentation Precautions:  Precautions Precautions: Fall Precaution Comments: diplopia Restrictions Weight Bearing Restrictions: No Pain: Pain Assessment Pain Scale: Faces Faces Pain Scale: No hurt   Exercises:   Instructed in 2#db unilateral exercises including elbow flexion, chest press, shoulder press, shoulder flexion and abduction for 3x15 with min cuing for proper tech with good carryover. Pt then given green theraband and instructed in tricep extension and chest pull for 3x15 with min cuing for proper tech with good carryover. Pt required entire therapy session to complete exercises to focus on proper form and requires frequent rest breaks.    Therapy/Group: Individual  Therapy  Kohen Reither 05/05/2021, 2:13 PM

## 2021-05-05 NOTE — Progress Notes (Signed)
Speech Language Pathology Daily Session Note  Patient Details  Name: Jordan [name]. MRN: [id number] Date of Birth: 11/13/1946  Today's Date: 05/05/2021 SLP Individual Time: 1030-1100 SLP Individual Time Calculation (min): 30 min  Short Term Goals: Week 1: SLP Short Term Goal 1 (Week 1): Patient will perform simulated medication management/organization using pill box with minA cues. SLP Short Term Goal 2 (Week 1): Patient will perform money management/calculation tasks with minA for accuracy and awareness to errors. SLP Short Term Goal 3 (Week 1): Patient will demonstrate recall and effective use of learned strategies/exercises from PT, OT, ST sessions, with minA cues. SLP Short Term Goal 4 (Week 1): Patient will be able to recall and restate at least 75% of facts/information after SLP read aloud of short (3-4 sentence) paragraph, with modA cues.  Skilled Therapeutic Interventions: Skilled SLP intervention focused on cognition. Calendar organization task of mod complexity completed with min A verbal cues for awareness of errors. Pt often placed events in incorrect dates and needed visual and verbal cue to notice error. Pt stated he was listening to tv while doing task and noticed this task took him more than than it usually would. Educated pt on impact of distractions when completing tasks at home and strategies to increase attention. Cont with therapy per plan of care.      Pain Pain Assessment Pain Scale: Faces Faces Pain Scale: No hurt  Therapy/Group: Individual Therapy  Darrol Poke Lynnex Fulp 05/05/2021, 10:53 AM

## 2021-05-05 NOTE — Progress Notes (Signed)
Occupational Therapy Session Note  Patient Details  Name: Jordan [name]. MRN: [id number] Date of Birth: October 13, 1946  Today's Date: 05/05/2021 OT Individual Time: 0759-0900 OT Individual Time Calculation (min): 61 min    Short Term Goals: Week 1:  OT Short Term Goal 1 (Week 1): Pt will complete toilet transfer and Min A with LRAD OT Short Term Goal 2 (Week 1): Pt will locate ADL items needed for bathing with setup assist using compensatory strategies as needed OT Short Term Goal 3 (Week 1): Pt will complete oral care while standing at the sink without relying on forearm support for upright stability  Skilled Therapeutic Interventions/Progress Updates:    Pt received in room in bed and consented to OT tx. Pt seen for toileting, LB bathing  and dressing, grooming and oral hygiene, and standing tolerance with visual scanning this session. Pt received soiled, unaware, instructed to walk to toilet with RW with min A to sit on commode and wash LB with washcloth with setup assist. Pt req min A to don brief and LB clothing, assist to thread L leg and hike pants all the way up. After toileting and LB dressing, pt instructed in hand hygiene and oral care while standing sink side with CGA. Pt erquires cuing for RW safety during ambulation and standing tasks. Pt taken down to gym, instructed in standing tolerance and balance task with functional reach and visual search to match playing cards on board. Pt required frequent seated rest breaks due to fatigue. Pt req cuing for upright posture while standing with good carryover. Pt requires cuing for proper hand placement throughout session. Instructed pt in seated BUE strengthening HEP to increase strength and activity tolerance for ADLs and functional transfers. Pt instructed in 3# dowel rod exercises including elbow flexion for 3x20 with min cuing for proper tech with good carryover. After tx, pt helped back to bed and left with bed alarm on and all needs met.    Therapy Documentation Precautions:  Precautions Precautions: Fall Precaution Comments: diplopia Restrictions Weight Bearing Restrictions: No    Vital Signs: Therapy Vitals Temp: 97.9 F (36.6 C) Temp Source: Oral Pulse Rate: 67 Resp: 19 BP: (!) 163/102 Patient Position (if appropriate): Lying Oxygen Therapy SpO2: 98 % O2 Device: Room Air Pain: none      Therapy/Group: Individual Therapy  Lorian Yaun 05/05/2021, 8:06 AM

## 2021-05-06 MED ORDER — DICLOFENAC SODIUM 1 % EX GEL
2.0000 g | Freq: Four times a day (QID) | CUTANEOUS | Status: DC
  Administered 2021-05-06 – 2021-05-12 (×21): 2 g via TOPICAL
  Filled 2021-05-06: qty 100

## 2021-05-06 NOTE — Progress Notes (Signed)
Occupational Therapy Session Note  Patient Details  Name: Jordan [name]. MRN: [id number] Date of Birth: 1946/08/05  Today's Date: 05/06/2021 OT Individual Time: 4360-6770 OT Individual Time Calculation (min): 59 min    Short Term Goals: Week 1:  OT Short Term Goal 1 (Week 1): Pt will complete toilet transfer and Min A with LRAD OT Short Term Goal 2 (Week 1): Pt will locate ADL items needed for bathing with setup assist using compensatory strategies as needed OT Short Term Goal 3 (Week 1): Pt will complete oral care while standing at the sink without relying on forearm support for upright stability  Skilled Therapeutic Interventions/Progress Updates:    Pt received in bed and consented to OT tx. Session focused on morning ADL routine including bathing, dressing with AE instruction, and functional transfer training. Pt completed stand pivot transfers from bed to w/c and from w/c>< 3 in 1 commode with CGA. Pt reports he didn't want to walk all the way into the bathroom this morning 2/2 BLEs feeling weaker this morning. Pt able to weight shift to wash buttocks in shower while seated. Able to complete bathing with setup and increased time for thoroughness. Instructed in use of AE including sock aide and shoe horn to increase footwear mgmt independence. Pt req mod A for L sock and shoe 2/2 foot pain. Continue AE training for practice. MD present and aware of pain. Pt req CGA to stand, cuing for proper steps and sequencing during functional transfers, cuing for proper hand placement and to stay inside rolling walker when walking. After tx, pt left up in w/c with all needs met awaiting following SLP tx session.  Therapy Documentation Precautions:  Precautions Precautions: Fall Precaution Comments: diplopia Restrictions Weight Bearing Restrictions: No General:   Vital Signs: Therapy Vitals Temp: 97.6 F (36.4 C) Temp Source: Oral Pulse Rate: 63 Resp: 20 BP: (!) 144/103 Patient Position  (if appropriate): Lying Oxygen Therapy SpO2: 100 % O2 Device: Room Air Pain: Pain Assessment Pain Scale: 0-10 Pain Score: 7  Pain Type: Acute pain Pain Location: Foot Pain Orientation: Left Pain Descriptors / Indicators: Aching;Throbbing;Discomfort;Nagging;Numbness Pain Frequency: Intermittent Pain Onset: Gradual Patients Stated Pain Goal: 2 Pain Intervention(s): Medication (See eMAR);Repositioned ADL: ADL Eating: Not assessed Grooming: Contact guard Where Assessed-Grooming: Standing at sink Upper Body Bathing: Setup Where Assessed-Upper Body Bathing: Shower Lower Body Bathing: Supervision/safety Where Assessed-Lower Body Bathing: Shower Upper Body Dressing: Setup Where Assessed-Upper Body Dressing: Chair Lower Body Dressing: Contact guard Where Assessed-Lower Body Dressing: Chair Toileting: Moderate assistance Where Assessed-Toileting: Glass blower/designer: Moderate assistance Toilet Transfer Method: Ambulating (without AD) Toilet Transfer Equipment: Energy manager: Curator Method: Stand pivot   Therapy/Group: Individual Therapy  Evelisse Szalkowski 05/06/2021, 8:35 AM

## 2021-05-06 NOTE — Progress Notes (Signed)
Physical Therapy Session Note  Patient Details  Name: Jordan [name]. MRN: [id number] Date of Birth: 1946-05-26  Today's Date: 05/06/2021 PT Individual Time: 1454-1530 PT Individual Time Calculation (min): 36 min   Short Term Goals: Week 1:  PT Short Term Goal 1 (Week 1): Pt will ambulate 13ft w/LRAD and min assist PT Short Term Goal 2 (Week 1): bed to/from wc w/LRAD and min assist PT Short Term Goal 3 (Week 1): Pt will perform car transfer w/safe technique w/LRAD and min assist, minimal cueing for safety.  Skilled Therapeutic Interventions/Progress Updates:    Pt received sitting in w/c, agreeable to PT session. No reports of pain during session. RN arriving with PT to administer rx. W/c transport for time management to main rehab gym. Stand<>Pivot transfer with minA and no AD from w/c to mat table. Completed "warm up" exercises including 1x10 sit<>stands with CGA and seated LAQ to beat of pt selected jazz music. Gait training 111ft + 128ft with CGA and RW - demo's short shuffling steps and moderate reliance of BUE through RW. Cues for gait corrections and reducing downward gaze. Completed standing chest press and shldr press with 1#dowel rod, 2x10 reps each - focusing more on balance > strength. Pt needing CGA/minA for standing balance while doing this due to posterior bias. Then completed standing ball toss with +2 assist for guarding. Worked on BUE coordination, righting reaction, dynamic standing balance, and standing tolerance - despite diplopia, pt able to catch and toss ball appropriately although he did have difficulty with accuracy tossing ball to therapist - able to stand for ~3-5 minutes during this activity with minA guard from tech. Pt transported back to his room with totalA in w/c for time management. Completed stand<>pivot transfer with minA and use of bedrail back to bed. Tennis shoes removed with totalA while he sat EOB and bed mobility completed with supervision. Bed alarm  activated, needs within reach at end of session.  Therapy Documentation Precautions:  Precautions Precautions: Fall Precaution Comments: diplopia Restrictions Weight Bearing Restrictions: No General:    Therapy/Group: Individual Therapy  Leeah Politano P Ihan Pat PT 05/06/2021, 7:34 AM

## 2021-05-06 NOTE — Progress Notes (Signed)
Speech Language Pathology Daily Session Note  Patient Details  Name: Jordan [name]. MRN: [id number] Date of Birth: Nov 01, 1946  Today's Date: 05/06/2021 SLP Individual Time: 2119-4174 SLP Individual Time Calculation (min): 45 min  Short Term Goals: Week 1: SLP Short Term Goal 1 (Week 1): Patient will perform simulated medication management/organization using pill box with minA cues. SLP Short Term Goal 2 (Week 1): Patient will perform money management/calculation tasks with minA for accuracy and awareness to errors. SLP Short Term Goal 3 (Week 1): Patient will demonstrate recall and effective use of learned strategies/exercises from PT, OT, ST sessions, with minA cues. SLP Short Term Goal 4 (Week 1): Patient will be able to recall and restate at least 75% of facts/information after SLP read aloud of short (3-4 sentence) paragraph, with modA cues.  Skilled Therapeutic Interventions: Skilled SLP intervention focused on cognition. Pt given updated list of medications with purpose and times to take medications. He complete TID pill organizing task with mod A visual and verbal cues for understanding of placement of pills for BID AM/PM pills. Short term memory recall task with pt recall 2-3 details in short paragraphs completed with min A verbal cues. Cont with therapy per plan of care.      Pain Pain Assessment Pain Scale: Faces Pain Score: 0-No pain Faces Pain Scale: No hurt  Therapy/Group: Individual Therapy  Darrol Poke Kenetha Cozza 05/06/2021, 9:42 AM

## 2021-05-06 NOTE — Progress Notes (Signed)
Patient ID: Jordan [name]., male   DOB: Dec 10, 1946, 75 y.o.   MRN: [id number]  Met with pt to discus team conference goals of mod/i and target discharge date of 5/24. Aware will need to stay to reach level can be safe home alone. He is pleased with his vision getting better each day and is hopeful this will continue. He will tell his sister, worker offered to call her and give her an update and he reports he will be talking with her later today. Continue to work on discharge needs.

## 2021-05-06 NOTE — Progress Notes (Signed)
PROGRESS NOTE   Subjective/Complaints: Patient seen sitting up in his chair this AM, working with therapies.  He states he slept well overnight.  He notes vision improving. Discussed stroke etiology.   ROS: Denies CP, SOB, N/V/D  Objective:   No results found. Recent Labs    05/04/21 0655  WBC 6.9  HGB 15.8  HCT 47.2  PLT 208   Recent Labs    05/04/21 0655  NA 137  K 4.4  CL 108  CO2 23  GLUCOSE 92  BUN 16  CREATININE 1.40*  CALCIUM 9.0    Intake/Output Summary (Last 24 hours) at 05/06/2021 1017 Last data filed at 05/06/2021 0804 Gross per 24 hour  Intake 910 ml  Output 800 ml  Net 110 ml        Physical Exam: Vital Signs Blood pressure 140/78, pulse 76, temperature 97.6 F (36.4 C), temperature source Oral, resp. rate 20, height 6' (1.829 m), weight 95.8 kg, SpO2 100 %. Constitutional: No distress . Vital signs reviewed. HENT: Normocephalic.  Atraumatic. Eyes: EOMI. No discharge. Cardiovascular: No JVD.  Irregularly irregular.  Respiratory: Normal effort.  No stridor.  Bilateral clear to auscultation. GI: Non-distended.  BS +. Skin: Warm and dry.  Intact. Psych: Normal mood.  Normal behavior. Musc: No edema in extremities.  No tenderness in extremities. Neuro: Alert Dysconjugate gaze. Diplopia, stable Motor: RUE/RLE: 5/5 proximal distal LUE/LLE: 4+/5 proximal to distal, unchanged Bilateral upper extremity dysmetria, improving   Assessment/Plan: 1. Functional deficits which require 3+ hours per day of interdisciplinary therapy in a comprehensive inpatient rehab setting.  Physiatrist is providing close team supervision and 24 hour management of active medical problems listed below.  Physiatrist and rehab team continue to assess barriers to discharge/monitor patient progress toward functional and medical goals  Care Tool:  Bathing    Body parts bathed by patient: Left arm,Right  arm,Chest,Abdomen,Front perineal area,Buttocks,Right upper leg,Left upper leg,Right lower leg,Left lower leg,Face         Bathing assist Assist Level: Supervision/Verbal cueing     Upper Body Dressing/Undressing Upper body dressing   What is the patient wearing?: Pull over shirt    Upper body assist Assist Level: Set up assist    Lower Body Dressing/Undressing Lower body dressing      What is the patient wearing?: Underwear/pull up,Pants     Lower body assist Assist for lower body dressing: Contact Guard/Touching assist     Toileting Toileting    Toileting assist Assist for toileting: Minimal Assistance - Patient > 75% Assistive Device Comment: urinal   Transfers Chair/bed transfer  Transfers assist     Chair/bed transfer assist level: Minimal Assistance - Patient > 75%     Locomotion Ambulation   Ambulation assist      Assist level: Minimal Assistance - Patient > 75% Assistive device: Walker-rolling Max distance: 86ft   Walk 10 feet activity   Assist     Assist level: Minimal Assistance - Patient > 75% Assistive device: Walker-rolling   Walk 50 feet activity   Assist Walk 50 feet with 2 turns activity did not occur: Safety/medical concerns         Walk 150 feet  activity   Assist Walk 150 feet activity did not occur: Safety/medical concerns         Walk 10 feet on uneven surface  activity   Assist     Assist level: Moderate Assistance - Patient - 50 - 74% Assistive device: Hand held assist   Wheelchair     Assist Will patient use wheelchair at discharge?: No             Wheelchair 50 feet with 2 turns activity    Assist            Wheelchair 150 feet activity     Assist          Medical Problem List and Plan: 1.  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  Continue  CIR  Repeat head CT on 5/9, showing evolution of infarct.   Team conference today to discuss current and goals and coordination of care, home and environmental barriers, and discharge planning with nursing, case manager, and therapies. Please see conference note from today as well.  2.  Antithrombotics: -DVT/anticoagulation:  Pharmaceutical: Eliquis             -antiplatelet therapy: Eliquis 3. Pain Management: N/A 4. Mood: LCSW to follow for evaluation and support.              -antipsychotic agents: N/a 5. Neuropsych: This patient is capable of making decisions on  own behalf. 6. Skin/Wound Care: Routine pressure-relief measures. 7. Fluids/Electrolytes/Nutrition: Monitor intake/output. 8.  HTN: Monitor blood pressures twice daily. Continue Losartan and coreg.              Hydralizine 10 TID started on 5/9  Relatively controlled on 5/11  Monitor with increased mobility 9.  Mild elevation in white count: Resolved  Monitor for fevers and other signs of infection.   10.  CKD IIIa:   Cr. 1.40 on 5/9  Cont to monitor 11.  Gout hx: Continue allopurinol             Monitor for flares 12. Afib             Monitor with increased exertion             Eliquis started  Rate controlled on 5/11   LOS: 5 days A FACE TO FACE EVALUATION WAS PERFORMED  Saxon Crosby [name] 05/06/2021, 9:22 AM

## 2021-05-06 NOTE — Patient Care Conference (Signed)
Inpatient RehabilitationTeam Conference and Plan of Care Update Date: 05/06/2021   Time: 11:09 AM    Patient Name: Jordan [name].      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 Delice Lesch Care Coodinator Present: Dorien Chihuahua, RN, BSN, CRRN;Becky Dupree, LCSW Nurse Present: Dorien Chihuahua, RN PT Present: Ginnie Smart, PT OT Present: Willeen Cass, OT SLP Present: Other (comment) Gunnar Fusi, SLP) PPS Coordinator present : Gunnar Fusi, SLP     Current Status/Progress Goal Weekly Team Focus  Bowel/Bladder   Patient is continent with occassional incontinent episodes of B/B, LBM 05/04/21  Maintain continence and regularity  of B/B  Time toileting Q 2 hrs while away,QS/PRN assessment   Swallow/Nutrition/ Hydration             ADL's   UB ADLs: setup, LB ADLs min-CGA 2/2 balance deficits, toileting CGA  SUP  balance, BUE strengthening, safety awareness, visual compensatory strategies, ADL training for maximal indpendence upon d/c home   Mobility   minA bed mobility, minA transfers, gait ~48ft minA RW  mod I  Standing balance, BLE strengthening, safety awareness, functional mobility, gait training with LRAD, pt education   Communication             Safety/Cognition/ Behavioral Observations  Min A for cognition. has mild deficits, needs additional time  Mod I  mildly complex prob solving, emergent awareness, and memory of daily  information   Pain   pain in left foot this morning aching, numbness, throbbing on pain score 7/10  Pain < 2  QS/PRN assessment   Skin   Skin intact, swelling left ankle lower leg  Maintain skin integrity  Assess QS/PRN address new and current concerns or issues     Discharge Planning:  Pt plans to return home with sister and son checking on him. He will not have 24/7 supervision at DC   Team Discussion: Ankle pain (non-gout) = Voltaren gel. Cleared to start Eliquis post CT scan. MD adjusting BP medications. Progress limited by weakness, visual deficits/diplopia and poor standing balance.  Patient on target to meet rehab goals: yes, currently CGA for ADLs and able to ambulate 50'-75' with min assist using a RW. Min assist for transfers. Discharge goals set for mod I.  *See Care Plan and progress notes for long and short-term goals.   Revisions to Treatment Plan:  SLP working on problem solving, self monitoring , memory strategies ATP given to patient with exercises  Teaching Needs: Transfers, toileting, secondary stroke risk management, medications, and dietary modifications, etc.   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  Barriers to Discharge: Medical stability;Decreased family/caregiver support;Other (comments)  Barriers to  Discharge Comments: Visual deficits Possible Resolutions to Barriers/Weekly Focus: Therapies, follow labs - Cr, optimize BP meds   Continued Need for Acute Rehabilitation Level of Care: The patient requires daily medical management by a physician with specialized training in physical medicine and rehabilitation for the following reasons: Direction  of a multidisciplinary physical rehabilitation program to maximize functional independence : Yes Medical management of patient stability for increased activity during participation in an intensive rehabilitation regime.: Yes Analysis of laboratory values and/or radiology reports with any subsequent need for medication adjustment and/or medical intervention. : Yes   I attest that I was present, lead the team conference, and concur with the assessment and plan of the team.   Dorien Chihuahua B 05/06/2021, 2:04 PM

## 2021-05-06 NOTE — Progress Notes (Signed)
Occupational Therapy Session Note  Patient Details  Name: Jordan [name]. MRN: [id number] Date of Birth: 1946/11/03  Today's Date: 05/06/2021 OT Individual Time: 1130-1158 OT Individual Time Calculation (min): 28 min    Short Term Goals: Week 1:  OT Short Term Goal 1 (Week 1): Pt will complete toilet transfer and Min A with LRAD OT Short Term Goal 2 (Week 1): Pt will locate ADL items needed for bathing with setup assist using compensatory strategies as needed OT Short Term Goal 3 (Week 1): Pt will complete oral care while standing at the sink without relying on forearm support for upright stability  Skilled Therapeutic Interventions/Progress Updates:    Pt received in w/c in room and consented to OT tx. Session focused on standing balance, standing tolerance, visual scanning, and safety during STS transitions. Pt req min cuing for proper hand placement during STS with RW and CGA-close SUP. Pt instructed in card matching activity, first trial without pt's modified glasses, pt was over and undershooting matching of cards. Once pt put his glasses on, his card placement was much more accurate each time. Pt req 2 seated rest breaks during activity due to fatigue. Cuing for proper upright posture, RW adjusted to pt's height. Pt's posture has improved today vs. Yesterday during standing and reaching outside of BOS. Pt req one hand on RW for balance, is able to manipulate cards in both hands with no support for a moment before becoming unstable and catching himself on RW for balance. After tx, pt left up in w/c with all needs met.   Therapy Documentation Precautions:  Precautions Precautions: Fall Precaution Comments: diplopia Restrictions Weight Bearing Restrictions: No   Vital Signs: Therapy Vitals Pulse Rate: 76 BP: 140/78 Pain: Pain Assessment Pain Scale: Faces Pain Score: 0-No pain Faces Pain Scale: No hurt   Therapy/Group: Individual Therapy  Zauria Dombek 05/06/2021, 12:06  PM

## 2021-05-07 MED ORDER — HYDRALAZINE HCL 25 MG PO TABS
25.0000 mg | ORAL_TABLET | Freq: Three times a day (TID) | ORAL | Status: DC
  Administered 2021-05-07 – 2021-05-10 (×9): 25 mg via ORAL
  Filled 2021-05-07 (×9): qty 1

## 2021-05-07 NOTE — Progress Notes (Signed)
Occupational Therapy Session Note  Patient Details  Name: Jordan [name]. MRN: [id number] Date of Birth: 1946/03/07  Today's Date: 05/07/2021 OT Individual Time: 7342-8768 OT Individual Time Calculation (min): 60 min    Short Term Goals: Week 1:  OT Short Term Goal 1 (Week 1): Pt will complete toilet transfer and Min A with LRAD OT Short Term Goal 2 (Week 1): Pt will locate ADL items needed for bathing with setup assist using compensatory strategies as needed OT Short Term Goal 3 (Week 1): Pt will complete oral care while standing at the sink without relying on forearm support for upright stability  Skilled Therapeutic Interventions/Progress Updates:    Pt received in bed and consented to OT tx. Pt instructed to sit EOB with min A, don scrub pants with min A to thread LLE. Assist to don socks on LLE. Pt moving much lower today, reports he is sleepy and feels dizzy/lightheaded. BP taken and was 143/119. Retaken and 156/106. RN notified. RN manual BP 143/100. Per RN, pt does not have any BP meds prn, cleared pt for light therapy session. Pt completed SPT from EOB to w/c with CGA, instructed in shaving task while seated in w/c. Pt required increased time, cuing for proficiency for shaving entire face. Pt changed shirt with setup. After tx, pt's BP taken again, 163/115, RN notified. Pt left up in w/c with all needs met.   Therapy Documentation Precautions:  Precautions Precautions: Fall Precaution Comments: diplopia Restrictions Weight Bearing Restrictions: No   Vital Signs: Therapy Vitals Pulse Rate: 64 BP: (!) 143/100 Patient Position (if appropriate): Sitting Pain: Pain Assessment Pain Scale: Faces Pain Score: 0-No pain Faces Pain Scale: No hurt   Therapy/Group: Individual Therapy  Anessa Charley 05/07/2021, 11:24 AM

## 2021-05-07 NOTE — Progress Notes (Signed)
Physical Therapy Session Note  Patient Details  Name: Jordan [name]. MRN: [id number] Date of Birth: 10/29/1946  Today's Date: 05/07/2021 PT Individual Time: 1300-1413 PT Individual Time Calculation (min): 73 min   Short Term Goals: Week 1:  PT Short Term Goal 1 (Week 1): Pt will ambulate 49ft w/LRAD and min assist PT Short Term Goal 2 (Week 1): bed to/from wc w/LRAD and min assist PT Short Term Goal 3 (Week 1): Pt will perform car transfer w/safe technique w/LRAD and min assist, minimal cueing for safety.  Skilled Therapeutic Interventions/Progress Updates:    Pt greeted in room with RN assisting with transfer back to bed. MD also present for rounds. Pt agreeable to PT tx. No reports of pain. Per RN, pt has been having HTN and MD is made aware. Due to Afib, it was recommend manual BP instead of dynamp. Performed stand<>pivot with miNA back to his w/c and donned tennis shoes with totalA for time management. Pt transported in w/c to main rehab gym for time management. Stand<>pivot with minA to mat table and worked on "warm ups" to tolerance. Completed 1x10 sit<>stands with CGA from mat table with cues needed for full thoracic extension for upright posture. Sit>supine with supervision onto mat table. Completed the following supine there-ex for BLE strengthening and improving activity tolerance: -2x10 knee-to-chest, bilaterally -2x10 bridges with cues needed for full hip extension -2x15 sidelying clamshells, bilaterally (able to roll on mat table with supervision) -2x15 sidelying straight leg hip abduction, bilaterally  Gait training 2x16ft with CGA/minA and RW - demo's short shuffling steps with downward gaze - cues for corrections throughout, able to correct but difficulty with maintaining. Completed repeated toe taps, 2x20 (seated rest) onto 6ich step with 2 hand rail support - cues needed for L foot clearance and cadence.   Transported back to his room in w/c for time management and  completed stand<>pivot with minA back to bed with assist from bedrail. Cues for safety awareness and technique. Completed sit>supine with supervision with increased difficulty with LLE management. Remains supine in bed at end of session with bed alarm on and needs within reach.  Therapy Documentation Precautions:  Precautions Precautions: Fall Precaution Comments: diplopia Restrictions Weight Bearing Restrictions: No General:    Therapy/Group: Individual Therapy  Hikaru Delorenzo P Rona Tomson PT 05/07/2021, 12:29 PM

## 2021-05-07 NOTE — Progress Notes (Signed)
Speech Language Pathology Daily Session Note  Patient Details  Name: Jordan [name]. MRN: [id number] Date of Birth: 1946-09-02  Today's Date: 05/07/2021 SLP Individual Time: 8563-1497 SLP Individual Time Calculation (min): 45 min  Short Term Goals: Week 1: SLP Short Term Goal 1 (Week 1): Patient will perform simulated medication management/organization using pill box with minA cues. SLP Short Term Goal 2 (Week 1): Patient will perform money management/calculation tasks with minA for accuracy and awareness to errors. SLP Short Term Goal 3 (Week 1): Patient will demonstrate recall and effective use of learned strategies/exercises from PT, OT, ST sessions, with minA cues. SLP Short Term Goal 4 (Week 1): Patient will be able to recall and restate at least 75% of facts/information after SLP read aloud of short (3-4 sentence) paragraph, with modA cues.  Skilled Therapeutic Interventions: Skilled SLP intervention focused on cognition. Pt reported difficulty navigating cell phone and locating family members numbers that he frequently calls. Min A required with phone navigation task to locate local events and pricing information for products. Difficulty noted with typing on keyboard on phone. However, he was able to use voice to text to enter information in google. Supervision A needed to locate information on pages relating to date, location, event details and pricing information for products. Pt left seated upright in bed with call button within reach and bed alarm set.       Pain Pain Assessment Pain Scale: Faces Pain Score: 0-No pain Faces Pain Scale: No hurt  Therapy/Group: Individual Therapy  Darrol Poke Wanda Rideout 05/07/2021, 9:12 AM

## 2021-05-07 NOTE — Evaluation (Signed)
Recreational Therapy Assessment and Plan  Patient Details  Name: Jordan [name]. MRN: [id number] Date of Birth: 11-01-1946 Today's Date: 05/07/2021  Rehab Potential:  Good ELOS:   d/c 5/24  Assessment  Hospital Problem: Principal Problem:   Thalamic stroke [facility])   Past Medical History:      Past Medical History:  Diagnosis Date  . Atrial fib/flutter, transient    Hx  . Atrial fibrillation (HCC)    coumadin  . Colon cancer [facility])    family hx  . Diverticulosis   . Gout   . Hemorrhoids   . Hepatitis C   . History of DVT (deep vein thrombosis)   . History of nuclear stress test 04/2002   exercise; normal study   . Hypertension   . 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 Personal history of colonic polyps   . Prostate cancer [facility])    radical prostatectomy    Past Surgical History:       Past Surgical History:  Procedure Laterality Date  . CARDIAC CATHETERIZATION  2007   no occlusive CAD  . COLONOSCOPY  4098,1191   [name]  . PROSTATECTOMY    . PROSTATECTOMY  04-04   Dr. Risa Grill  . TRANSTHORACIC ECHOCARDIOGRAM  05/2006   EF ~47%; LV systolic function normal; mild focal basal septal hypertrophy; AV thickness mildly increased; LA mod-markedly dilate    Assessment & Plan Clinical Impression: Jordan [name] is a 75 year old male with history of HTN, A fib/DVT- on coumadin, Hep C. non-obstructive HCM, substance abuse; was admitted on 04/27/2021 with weakness and dizziness times a few days. History taken from chart review and patient.UDS was negative and INR 1.3/subtherapeutic at admission. CT head showed subacute anterior right thalamic infarct. CTA head was negative for LVO but showed enhancement defects proximal ICA bilaterally. MRI/MRI brain showed subacute infarct in ventral and medial right thalamus with small amount of petechial hemorrhage. Echocardiogram with EF of 45-50% with severe  biatrial dilatation and mild AVR. Carotid Dopplers were negative for significant ICA stenosis.   Dr. Erlinda Hong felt that stroke was likely due to small vessel disease versus subtherapeutic INR and recommended starting aspirin with repeat CT in 5-7 days and transition to Waves if stable. He was also noted to have CKD with SCr 1.69 and polycythemia with Hgb 19.2-->improving with hydration. dehydration. Elevated BNP noted without signs of volume overload. Patient continues to be limited by diplopia with balance deficits and gait disorder. CIR was recommended due to functional decline.  Pt presents with decreased activity tolerance, decreased functional mobility, decreased balance, decreased coordination, decreased vision, diplopia, decreased midline orientation, left inattention, decreased attention, decreased awareness and decreased safety Limiting pt's independence with leisure/community pursuits.   Plan  Min 1 TR session >20 minutes per week during LOS  Recommendations for other services: Neuropsych  Discharge Criteria: Patient will be discharged from TR if patient refuses treatment 3 consecutive times without medical reason.  If treatment goals not met, if there is a change in medical status, if patient makes no progress towards goals or if patient is discharged from hospital.  The above assessment, treatment plan, treatment alternatives and goals were discussed and mutually agreed upon: by patient  Jordan [name] 05/07/2021, 3:47 PM

## 2021-05-07 NOTE — Progress Notes (Signed)
PROGRESS NOTE   Subjective/Complaints: BP elevated, increased hydralazine and provided with list of foods for HTN Asks why he had a stroke, discussed warfarin was subtherapeutic on admission. He has no other complaints  ROS: Denies CP, SOB, N/V/D  Objective:   No results found. No results for input(s): WBC, HGB, HCT, PLT in the last 72 hours. No results for input(s): NA, K, CL, CO2, GLUCOSE, BUN, CREATININE, CALCIUM in the last 72 hours.  Intake/Output Summary (Last 24 hours) at 05/07/2021 1336 Last data filed at 05/07/2021 0532 Gross per 24 hour  Intake 240 ml  Output 650 ml  Net -410 ml        Physical Exam: Vital Signs Blood pressure (!) 148/82, pulse 64, temperature 98 F (36.7 C), temperature source Oral, resp. rate 18, height 6' (1.829 m), weight 95.8 kg, SpO2 100 %.  Gen: no distress, normal appearing HEENT: oral mucosa pink and moist, NCAT Cardio: Irregularly irregular. Chest: normal effort, normal rate of breathing Abd: soft, non-distended Ext: no edema Psych: pleasant, normal affect Skin: intact Neuro: Alert Dysconjugate gaze. Diplopia, stable Motor: RUE/RLE: 5/5 proximal distal LUE/LLE: 4+/5 proximal to distal, unchanged Bilateral upper extremity dysmetria, improving   Assessment/Plan: 1. Functional deficits which require 3+ hours per day of interdisciplinary therapy in a comprehensive inpatient rehab setting.  Physiatrist is providing close team supervision and 24 hour management of active medical problems listed below.  Physiatrist and rehab team continue to assess barriers to discharge/monitor patient progress toward functional and medical goals  Care Tool:  Bathing    Body parts bathed by patient: Left arm,Right arm,Chest,Abdomen,Front perineal area,Buttocks,Right upper leg,Left upper leg,Right lower leg,Left lower leg,Face         Bathing assist Assist Level: Supervision/Verbal cueing      Upper Body Dressing/Undressing Upper body dressing   What is the patient wearing?: Pull over shirt    Upper body assist Assist Level: Set up assist    Lower Body Dressing/Undressing Lower body dressing      What is the patient wearing?: Underwear/pull up,Pants     Lower body assist Assist for lower body dressing: Minimal Assistance - Patient > 75%     Toileting Toileting    Toileting assist Assist for toileting: Minimal Assistance - Patient > 75% Assistive Device Comment: urinal   Transfers Chair/bed transfer  Transfers assist     Chair/bed transfer assist level: Minimal Assistance - Patient > 75%     Locomotion Ambulation   Ambulation assist      Assist level: Minimal Assistance - Patient > 75% Assistive device: Walker-rolling Max distance: 79ft   Walk 10 feet activity   Assist     Assist level: Minimal Assistance - Patient > 75% Assistive device: Walker-rolling   Walk 50 feet activity   Assist Walk 50 feet with 2 turns activity did not occur: Safety/medical concerns         Walk 150 feet activity   Assist Walk 150 feet activity did not occur: Safety/medical concerns         Walk 10 feet on uneven surface  activity   Assist     Assist level: Moderate Assistance - Patient - 21 -  74% Assistive device: Hand held assist   Wheelchair     Assist Will patient use wheelchair at discharge?: No             Wheelchair 50 feet with 2 turns activity    Assist            Wheelchair 150 feet activity     Assist          Medical Problem List and Plan: 1.  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  Continue CIR  Repeat head CT on 5/9, showing evolution of infarct.  2.  Antithrombotics: -DVT/anticoagulation:  Pharmaceutical: Eliquis             -antiplatelet therapy: Eliquis 3.  Pain Management: N/A 4. Mood: LCSW to follow for evaluation and support.              -antipsychotic agents: N/a 5. Neuropsych: This patient is capable of making decisions on  own behalf. 6. Skin/Wound Care: Routine pressure-relief measures. 7. Fluids/Electrolytes/Nutrition: Monitor intake/output. 8.  HTN: Monitor blood pressures twice daily. Continue Losartan and coreg.              Hydralizine 10 TID started on 5/9, increase to 25mg  TID on 5/12  Uncontrolled on 5/12  Monitor with increased mobility 9.  Mild elevation in white count: Resolved  Monitor for fevers and other signs of infection.   10.  CKD IIIa:   Cr. 1.40 on 5/9  Cont to monitor 11.  Gout hx: Continue allopurinol             Monitor for flares 12. Afib             Monitor with increased exertion             Eliquis started  Rate controlled on 5/12   LOS: 6 days A FACE TO FACE EVALUATION WAS PERFORMED  Jordan [name] 05/07/2021, 1:36 PM

## 2021-05-08 NOTE — Progress Notes (Signed)
Physical Therapy Session Note  Patient Details  Name: Jordan [name]. MRN: [id number] Date of Birth: 08-Mar-1946  Today's Date: 05/08/2021 PT Individual Time: 5366-4403 PT Individual Time Calculation (min): 60 min   Short Term Goals: Week 1:  PT Short Term Goal 1 (Week 1): Pt will ambulate 75ft w/LRAD and min assist PT Short Term Goal 2 (Week 1): bed to/from wc w/LRAD and min assist PT Short Term Goal 3 (Week 1): Pt will perform car transfer w/safe technique w/LRAD and min assist, minimal cueing for safety.  Skilled Therapeutic Interventions/Progress Updates:     Pt greeted seated in w/c, agreeable to PT session. No reports of pain. W/c transport for time management to day room gym. Completed 1x10 sit<>stands to RW with CGA - focusing on upright posture and arm placement for safety. Worked on dynamic standing balance with functional reaching in standing - wheeled to wall with targets #1-#6 placed on wall. Without glasses, performed unilateral reaching with LUE & RUE in all planes including upward reaching, midline, and downward reaching. Pt requires minA for dynamic standing balance. Moderate under/over shooting with reaches without glasses. Seated rest break and then applied glasses and performed the same activity and assist level - noted improved accuracy with targeted reaching. Gait training 165ft with CGA/minA and RW on level ground with x4 180deg turns incorporated - increased unsteadiness with turns with cues for L foot awareness and RW management. Worked on isolated hip/knee flexors and BLE coordination with alternating toe taps with 4# ankle weight to cones with RW support and CGA for balance. Nustep completed for 10 minutes at a workload of 5, using BUE/BLE combination, working on reciprocal movement patterns and strengthening overall activity tolerance. Pt able to maintain a cadence of ~65-70 steps/minutes during first 3 minutes of activity - reduced to ~40 steps/minute with cues to  allow energy conservation and increased duration. Rest breaks needed at minute 3, minute 5, and minute 8. Completed a total of 579 steps. Stand<>pivot with minA back to his w/c and returned to room for energy conservation. Pt requesting to remain seated in w.c at end of session. Safety belt alarm activated and needs within reach. Son at bedside who was updated on pt's progress with therapy - appreciative.   Therapy Documentation Precautions:  Precautions Precautions: Fall Precaution Comments: diplopia Restrictions Weight Bearing Restrictions: No General:    Therapy/Group: Individual Therapy  Shatyra Becka P Damion Kant PT 05/08/2021, 7:34 AM

## 2021-05-08 NOTE — Progress Notes (Signed)
SLP Cancellation Note  Patient Details Name: Jordan [name]. MRN: [id number] DOB: 24-Apr-1946   Cancelled treatment:        Patient sleeping when SLP entered room at 1500. He did awaken slightly when SLP spoke to him however quickly fell back asleep. SLP was unable to adequately awaken him for session.  Sonia Baller, MA, CCC-SLP Speech Therapy

## 2021-05-08 NOTE — Progress Notes (Signed)
Physical Therapy Session Note  Patient Details  Name: Jordan [name]. MRN: [id number] Date of Birth: 1946-12-24  Today's Date: 05/08/2021 PT Individual Time: 1400-1430 PT Individual Time Calculation (min): 30 min   Short Term Goals: Week 1:  PT Short Term Goal 1 (Week 1): Pt will ambulate 17ft w/LRAD and min assist PT Short Term Goal 2 (Week 1): bed to/from wc w/LRAD and min assist PT Short Term Goal 3 (Week 1): Pt will perform car transfer w/safe technique w/LRAD and min assist, minimal cueing for safety. Week 2:    Week 3:     Skilled Therapeutic Interventions/Progress Updates:    Pain:  Pt reports no pain.  Treatment to tolerance.  Rest breaks and repositioning as needed.    Pt initially oob in wc and agreeable to treatment session w/focus on functional gait. RN checking bp prior to session, WNL Sit to stand and gait x 7ft w/RW and cues for posture, gaze, increase step length and ht on L.  Functional gait: Pt tasked w/locating rings placed in L environment over course of 64ft and grasps w/L hand, cues to use glasses/tends to leave on head, overshoots items but does not think to utilize glasses even when indirectly cued. Continues w/gait deviations as above.  Can briefly correct but does not sustain.  Gait x 2ft w/challenge of weaving thru cones, pt w/very shuffling stooped slowed gait w/step to pattern and decreased clearance L when turning in either direction, cga, cues as above.  stand pivot transfer wc to bed w/rw and cues for safety/turn completely prior to sitting w/RW, cga.  Sit to supine w/cues to attend to L hemibody, cues for alighnment in bed. Pt left supine w/rails up x 4, alarm set, bed in lowest position, and needs in reach.   Therapy Documentation Precautions:  Precautions Precautions: Fall Precaution Comments: diplopia Restrictions Weight Bearing Restrictions: No    Therapy/Group: Individual Therapy  Callie Fielding, Oriental 05/08/2021, 2:47 PM

## 2021-05-08 NOTE — Progress Notes (Signed)
Occupational Therapy Session Note  Patient Details  Name: Jordan [name]. MRN: [id number] Date of Birth: 01-Apr-1946  Today's Date: 05/08/2021 OT Individual Time: 4174-0814 OT Individual Time Calculation (min): 60 min    Short Term Goals: Week 1:  OT Short Term Goal 1 (Week 1): Pt will complete toilet transfer and Min A with LRAD OT Short Term Goal 2 (Week 1): Pt will locate ADL items needed for bathing with setup assist using compensatory strategies as needed OT Short Term Goal 3 (Week 1): Pt will complete oral care while standing at the sink without relying on forearm support for upright stability  Skilled Therapeutic Interventions/Progress Updates:    Pt received in room and consented to OT tx. Pt seen for LB dressing training, req CGA to don pants as pt leans way over to the side/loses balance when attempting to thread feet through pant holes. Instructed in use of wide sock aide and long handled shoe horn for footwear mgmt, able to don L sock with sock aide with SUP, however req min A for R sock 2/2 gout pain. Pt demo's decreased problem solving skills when donning R shoe, however can don L shoe with setup. Pt taken down to gym via w/c, instructed in 4# dowel rod BUE strengthening HEP to increase strength and activity tolerance for ADLs. Instructed in elbow flexion, chest press, and shoulder press for 3x15 with min cuing for proper technique and instruction fo rpt to count verbally to ensure he doesn't hold his breath during exercises. Instructed in standing and sequencing task at [facility] with SUP and instruction to mark off numbers from 1-30 to facilitate visual scanning and sequencing. After tx, pt left up in w/c in room with all needs met.   Therapy Documentation Precautions:  Precautions Precautions: Fall Precaution Comments: diplopia Restrictions Weight Bearing Restrictions: No Vital Signs: Therapy Vitals Temp: 97.7 F (36.5 C) Temp Source: Oral Pulse Rate: 88 Resp: 18 BP: (!)  128/95 Patient Position (if appropriate): Lying Oxygen Therapy SpO2: 100 % O2 Device: Room Air Pain: Pain Assessment Pain Scale: 0-10 Pain Score: 7  Pain Type: Acute pain Pain Location: Foot Pain Orientation: Left Pain Descriptors / Indicators: Throbbing Pain Frequency: Intermittent Pain Onset: On-going Patients Stated Pain Goal: 2 Pain Intervention(s): Medication (See eMAR) (tylenol)   Therapy/Group: Individual Therapy  Chelsy Parrales 05/08/2021, 8:13 AM

## 2021-05-08 NOTE — Progress Notes (Signed)
PROGRESS NOTE   Subjective/Complaints: Patient seen sitting up in his chair this AM.  He states he slept well overnight. Discussed stroke again.  He notes improvement in vision.   ROS: Denies CP, SOB, N/V/D  Objective:   No results found. No results for input(s): WBC, HGB, HCT, PLT in the last 72 hours. No results for input(s): NA, K, CL, CO2, GLUCOSE, BUN, CREATININE, CALCIUM in the last 72 hours.  Intake/Output Summary (Last 24 hours) at 05/08/2021 1012 Last data filed at 05/08/2021 0900 Gross per 24 hour  Intake 240 ml  Output 600 ml  Net -360 ml        Physical Exam: Vital Signs Blood pressure (!) 128/95, pulse 88, temperature 97.7 F (36.5 C), temperature source Oral, resp. rate 18, height 6' (1.829 m), weight 95.8 kg, SpO2 100 %.  Constitutional: No distress . Vital signs reviewed. HENT: Normocephalic.  Atraumatic. Eyes: EOMI. No discharge. Cardiovascular: No JVD.  Irregularly irregular.  Respiratory: Normal effort.  No stridor.  Bilateral clear to auscultation. GI: Non-distended.  BS +. Skin: Warm and dry.  Intact. Psych: Normal mood.  Normal behavior. Musc: No edema in extremities.  No tenderness in extremities. Neuro: Alert Dysconjugate gaze. Diplopia, improving Motor: RUE/RLE: 5/5 proximal distal LUE/LLE: 4+/5 proximal to distal, unchanged Bilateral upper extremity dysmetria, improving   Assessment/Plan: 1. Functional deficits which require 3+ hours per day of interdisciplinary therapy in a comprehensive inpatient rehab setting.  Physiatrist is providing close team supervision and 24 hour management of active medical problems listed below.  Physiatrist and rehab team continue to assess barriers to discharge/monitor patient progress toward functional and medical goals  Care Tool:  Bathing    Body parts bathed by patient: Left arm,Right arm,Chest,Abdomen,Front perineal area,Buttocks,Right upper  leg,Left upper leg,Right lower leg,Left lower leg,Face         Bathing assist Assist Level: Supervision/Verbal cueing     Upper Body Dressing/Undressing Upper body dressing   What is the patient wearing?: Pull over shirt    Upper body assist Assist Level: Set up assist    Lower Body Dressing/Undressing Lower body dressing      What is the patient wearing?: Underwear/pull up,Pants     Lower body assist Assist for lower body dressing: Contact Guard/Touching assist     Toileting Toileting    Toileting assist Assist for toileting: Minimal Assistance - Patient > 75% Assistive Device Comment: urinal   Transfers Chair/bed transfer  Transfers assist     Chair/bed transfer assist level: Minimal Assistance - Patient > 75%     Locomotion Ambulation   Ambulation assist      Assist level: Minimal Assistance - Patient > 75% Assistive device: Walker-rolling Max distance: 44ft   Walk 10 feet activity   Assist     Assist level: Minimal Assistance - Patient > 75% Assistive device: Walker-rolling   Walk 50 feet activity   Assist Walk 50 feet with 2 turns activity did not occur: Safety/medical concerns         Walk 150 feet activity   Assist Walk 150 feet activity did not occur: Safety/medical concerns         Walk 10 feet  on uneven surface  activity   Assist     Assist level: Moderate Assistance - Patient - 50 - 74% Assistive device: Hand held assist   Wheelchair     Assist Will patient use wheelchair at discharge?: No             Wheelchair 50 feet with 2 turns activity    Assist            Wheelchair 150 feet activity     Assist          Medical Problem List and Plan: 1.  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  Continue CIR  Repeat head CT on 5/9, showing evolution of infarct.   2.  Antithrombotics: -DVT/anticoagulation:  Pharmaceutical: Eliquis             -antiplatelet therapy: Eliquis 3. Pain Management: N/A 4. Mood: LCSW to follow for evaluation and support.              -antipsychotic agents: N/a 5. Neuropsych: This patient is capable of making decisions on  own behalf. 6. Skin/Wound Care: Routine pressure-relief measures. 7. Fluids/Electrolytes/Nutrition: Monitor intake/output. 8.  HTN: Monitor blood pressures twice daily. Continue Losartan and coreg.              Hydralizine 10 TID started on 5/9, increase to 25mg  TID on 5/12  Labile, but ?improving on 5/13  Monitor with increased mobility 9.  Mild elevation in white count: Resolved  Monitor for fevers and other signs of infection.   10.  CKD IIIa:   Cr. 1.40 on 5/9, labs ordered for Monday  Cont to monitor 11.  Gout hx: Continue allopurinol             Monitor for flares 12. Afib             Monitor with increased exertion             Eliquis started  Rate controlled on 5/13   LOS: 7 days A FACE TO FACE EVALUATION WAS PERFORMED  [place] Crance [name] 05/08/2021, 10:12 AM

## 2021-05-08 NOTE — Progress Notes (Signed)
Speech Language Pathology Weekly Progress and Session Note  Patient Details  Name: Jordan [name]. MRN: [id number] Date of Birth: Mar 02, 1946  Beginning of progress report period: 05/02/2021 End of progress report period: 05/08/2021  Today's Date: 05/08/2021    Short Term Goals: Week 1: SLP Short Term Goal 1 (Week 1): Patient will perform simulated medication management/organization using pill box with minA cues. SLP Short Term Goal 1 - Progress (Week 1): Progressing toward goal SLP Short Term Goal 2 (Week 1): Patient will perform money management/calculation tasks with minA for accuracy and awareness to errors. SLP Short Term Goal 2 - Progress (Week 1): Progressing toward goal SLP Short Term Goal 3 (Week 1): Patient will demonstrate recall and effective use of learned strategies/exercises from PT, OT, ST sessions, with minA cues. SLP Short Term Goal 3 - Progress (Week 1): Met SLP Short Term Goal 4 (Week 1): Patient will be able to recall and restate at least 75% of facts/information after SLP read aloud of short (3-4 sentence) paragraph, with modA cues. SLP Short Term Goal 4 - Progress (Week 1): Met    New Short Term Goals: Week 2: SLP Short Term Goal 1 (Week 2): Patient will perform simulated medication management/organization using pill box with minA cues. SLP Short Term Goal 2 (Week 2): Patient will perform money management/calculation tasks with minA for accuracy and awareness to errors. SLP Short Term Goal 3 (Week 2): Patient will demonstrate awareness to and attempt to self correct errors during functional tasks with minA cues. SLP Short Term Goal 4 (Week 2): Patient will demonstrate effective use of learned strategies from PT, OT, ST with supervision A.  Weekly Progress Updates:  Patient made good progress and met 2/4 STG's and is making progress towards the two STG's she did not meet. He is demonstrating improvements in problem solving, awareness and recall of recent events. He  continues to require cues and supervision for functional tasks (medication management, etc.) Patient continues to benefit from skilled SLP intervention to maximize cognitive function prior to discharge.   Intensity: Minumum of 1-2 x/day, 30 to 90 minutes Frequency: 3 to 5 out of 7 days Duration/Length of Stay: 5/24 Treatment/Interventions: Cognitive remediation/compensation;Cueing hierarchy;Medication managment;Patient/family education;Functional tasks   Sonia Baller, MA, CCC-SLP Speech Therapy

## 2021-05-09 ENCOUNTER — Other Ambulatory Visit: Payer: Self-pay | Admitting: Internal Medicine

## 2021-05-09 DIAGNOSIS — I4819 Other persistent atrial fibrillation: Secondary | ICD-10-CM

## 2021-05-09 NOTE — Progress Notes (Signed)
PROGRESS NOTE   Subjective/Complaints: Son is visiting today he has no complaints.  Discussed cardioembolic strokes  ROS: Denies CP, SOB, N/V/D  Objective:   No results found. No results for input(s): WBC, HGB, HCT, PLT in the last 72 hours. No results for input(s): NA, K, CL, CO2, GLUCOSE, BUN, CREATININE, CALCIUM in the last 72 hours.  Intake/Output Summary (Last 24 hours) at 05/09/2021 1321 Last data filed at 05/09/2021 1158 Gross per 24 hour  Intake 360 ml  Output 1600 ml  Net -1240 ml        Physical Exam: Vital Signs Blood pressure (!) 146/114, pulse 83, temperature 98.2 F (36.8 C), resp. rate 18, height 6' (1.829 m), weight 95.8 kg, SpO2 100 %.   General: No acute distress Mood and affect are appropriate Heart: Regular rate and rhythm no rubs murmurs or extra sounds Lungs: Clear to auscultation, breathing unlabored, no rales or wheezes Abdomen: Positive bowel sounds, soft nontender to palpation, nondistended Extremities: No clubbing, cyanosis, or edema Skin: No evidence of breakdown, no evidence of rash  Neuro: Alert Dysconjugate gaze. Diplopia, improving Motor: RUE/RLE: 5/5 proximal distal LUE/LLE: 4+/5 proximal to distal, unchanged Bilateral upper extremity dysmetria, improving   Assessment/Plan: 1. Functional deficits which require 3+ hours per day of interdisciplinary therapy in a comprehensive inpatient rehab setting.  Physiatrist is providing close team supervision and 24 hour management of active medical problems listed below.  Physiatrist and rehab team continue to assess barriers to discharge/monitor patient progress toward functional and medical goals  Care Tool:  Bathing    Body parts bathed by patient: Left arm,Right arm,Chest,Abdomen,Front perineal area,Buttocks,Right upper leg,Left upper leg,Right lower leg,Left lower leg,Face         Bathing assist Assist Level: Supervision/Verbal  cueing     Upper Body Dressing/Undressing Upper body dressing   What is the patient wearing?: Pull over shirt    Upper body assist Assist Level: Set up assist    Lower Body Dressing/Undressing Lower body dressing      What is the patient wearing?: Underwear/pull up,Pants     Lower body assist Assist for lower body dressing: Contact Guard/Touching assist     Toileting Toileting    Toileting assist Assist for toileting: Minimal Assistance - Patient > 75% Assistive Device Comment: urinal   Transfers Chair/bed transfer  Transfers assist     Chair/bed transfer assist level: Minimal Assistance - Patient > 75%     Locomotion Ambulation   Ambulation assist      Assist level: Minimal Assistance - Patient > 75% Assistive device: Walker-rolling Max distance: 126ft   Walk 10 feet activity   Assist     Assist level: Minimal Assistance - Patient > 75% Assistive device: Walker-rolling   Walk 50 feet activity   Assist Walk 50 feet with 2 turns activity did not occur: Safety/medical concerns  Assist level: Minimal Assistance - Patient > 75% Assistive device: Walker-rolling    Walk 150 feet activity   Assist Walk 150 feet activity did not occur: Safety/medical concerns         Walk 10 feet on uneven surface  activity   Assist Walk 10 feet on uneven surfaces  activity did not occur: Safety/medical concerns   Assist level: Moderate Assistance - Patient - 50 - 74% Assistive device: Hand held assist   Wheelchair     Assist Will patient use wheelchair at discharge?: No             Wheelchair 50 feet with 2 turns activity    Assist            Wheelchair 150 feet activity     Assist          Medical Problem List and Plan: 1.  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  Continue CIR  Repeat head CT  on 5/9, showing evolution of infarct.  2.  Antithrombotics: -DVT/anticoagulation:  Pharmaceutical: Eliquis             -antiplatelet therapy: Eliquis 3. Pain Management: N/A 4. Mood: LCSW to follow for evaluation and support.              -antipsychotic agents: N/a 5. Neuropsych: This patient is capable of making decisions on  own behalf. 6. Skin/Wound Care: Routine pressure-relief measures. 7. Fluids/Electrolytes/Nutrition: Monitor intake/output. 8.  HTN: Monitor blood pressures twice daily. Continue Losartan and coreg.              Hydralizine 10 TID started on 5/9, increase to 25mg  TID on 5/12   Vitals:   05/09/21 0500 05/09/21 1309  BP: (!) 142/93 (!) 146/114  Pulse: 65 83  Resp:  18  Temp:  98.2 F (36.8 C)  SpO2:  161%  Some diastolic elevation this morning continue to monitor on increased dose 9.  Mild elevation in white count: Resolved  Monitor for fevers and other signs of infection.   10.  CKD IIIa:   Cr. 1.40 on 5/9, labs ordered for Monday  Cont to monitor 11.  Gout hx: Continue allopurinol             Monitor for flares 12. Afib rate is well controlled 5/14             Monitor with increased exertion             Eliquis started   LOS: 8 days A FACE TO [place] E Shiana Rappleye 05/09/2021, 1:21 PM

## 2021-05-09 NOTE — Progress Notes (Signed)
Speech Language Pathology Daily Session Note  Patient Details  Name: Jordan [name]. MRN: [id number] Date of Birth: [birth year]  Today's Date: 05/09/2021 SLP Individual Time: 6767-2094 SLP Individual Time Calculation (min): 28 min  Short Term Goals: Week 2: SLP Short Term Goal 1 (Week 2): Patient will perform simulated medication management/organization using pill box with minA cues. SLP Short Term Goal 2 (Week 2): Patient will perform money management/calculation tasks with minA for accuracy and awareness to errors. SLP Short Term Goal 3 (Week 2): Patient will demonstrate awareness to and attempt to self correct errors during functional tasks with minA cues. SLP Short Term Goal 4 (Week 2): Patient will demonstrate effective use of learned strategies from PT, OT, ST with supervision A.  Skilled Therapeutic Interventions:Skilled ST services focused on cognitive skills. SLP facilitated mildly complex problem solving and error awareness skills in familiar TID pill organizer task. Pt required supervision A verbal cues for verbal problem solving medication multiple times per day (2-3x), but functionally required mod A verbal cues for problem solving and error awareness. Pt supports his sister will be present upon d/c to provided supervision of medication management. SLP recommends to continue task in upcoming sessions and for education with family prior to d/c from CIR.  Pt was left in room with call bell within reach and bed alarm set. SLP recommends to continue skilled services.     Pain Pain Assessment Pain Scale: 0-10 Pain Score: 0-No pain  Therapy/Group: Individual Therapy  Fisher Hargadon  [facility] 05/09/2021, 7:59 AM

## 2021-05-10 MED ORDER — HYDRALAZINE HCL 25 MG PO TABS
25.0000 mg | ORAL_TABLET | Freq: Four times a day (QID) | ORAL | Status: DC
  Administered 2021-05-10 – 2021-05-11 (×5): 25 mg via ORAL
  Filled 2021-05-10 (×5): qty 1

## 2021-05-10 NOTE — Progress Notes (Signed)
Physical Therapy Session Note  Patient Details  Name: Jordan [name]. MRN: [id number] Date of Birth: [birth year]  Today's Date: 05/10/2021 PT Individual Time: [id number] and 4650-3546 PT Individual Time Calculation (min): 58 min and 43 min  Short Term Goals: Week 1:  PT Short Term Goal 1 (Week 1): Pt will ambulate 56ft w/LRAD and min assist PT Short Term Goal 2 (Week 1): bed to/from wc w/LRAD and min assist PT Short Term Goal 3 (Week 1): Pt will perform car transfer w/safe technique w/LRAD and min assist, minimal cueing for safety.  Skilled Therapeutic Interventions/Progress Updates:   First session:  Pt presents asleep in bed, but arouses easily and agreeable to therapy.  Pt requires supervision for sup to sit although wrapped in sheet and requires cueing for safety.  Pt changed T-shirt w/ set-up but required mod A to bring pants over feet and dons shoes w/ total A.  Pt able to stand w/ CGA to change brief.  Brief soiled w/ smear of BM, NT notified and will chart.  PT performed Total A for pericare.  Pt able to pull up brief and pants w/ min A.  Pt transfers bed to w/c w/ RW and CGA.  Pt negotiated w/c up to 15' in hallway w/ supervision and use of BUE and LEs.  Pt then requires PT to complete trip to Dayroom 2/2 fatigue.  Pt performed 3 trials of TUG test w/ average of 36.9 secs.  Pt performed standing toe-taps 3 x 10 and obstacle course through cones w/ increased verbal cues for increased step length of LLE and posture, especially w/ turns.  Pt requires seated rest breaks 2/2 fatigue.  Pt amb x 6' w/o AD and CGA to recliner and elevated LEs.  Chair alarm on and all needs in reach.    Second session:  Pt presents sitting in recliner and ready for therapy.  Pt amb to Dyroom w/ RW and CGA.  Pt required verbal cues for posture and visual scanning during gait, but then returns to looking down.  Pt w/ noted slap of B feet, but L > right.  Pt performed 3 x 5 reps of sit to stand w/ CGA, verbal cues  and demonstration for improved initiation and use of LEs.  Pt required seated rest breaks between trials.  Pt amb multiple trials w/ RW and CGA up to 75', including turns to return to seat.  Pt amb from hallway to bed w/ RW and CGA.  Pt transfers sit to supine w/ supervision.  Bed alarm on and all needs in reach.     Therapy Documentation Precautions:  Precautions Precautions: Fall Precaution Comments: diplopia Restrictions Weight Bearing Restrictions: No General:   Vital Signs: Therapy Vitals Temp: 97.9 F (36.6 C) Temp Source: Oral Pulse Rate: 67 Resp: 18 BP: (!) 128/101 Patient Position (if appropriate): Lying Oxygen Therapy SpO2: 97 % O2 Device: Room Air Pain:0/10 Pain Assessment Pain Scale: 0-10 Pain Score: 0-No pain Mobility:      Therapy/Group: Individual Therapy  Ladoris Gene 05/10/2021, 9:00 AM

## 2021-05-11 LAB — BASIC METABOLIC PANEL
Anion gap: 6 (ref 5–15)
BUN: 16 mg/dL (ref 8–23)
CO2: 19 mmol/L — ABNORMAL LOW (ref 22–32)
Calcium: 8.7 mg/dL — ABNORMAL LOW (ref 8.9–10.3)
Chloride: 112 mmol/L — ABNORMAL HIGH (ref 98–111)
Creatinine, Ser: 1.42 mg/dL — ABNORMAL HIGH (ref 0.61–1.24)
GFR, Estimated: 52 mL/min — ABNORMAL LOW (ref >60)
Glucose, Bld: 112 mg/dL — ABNORMAL HIGH (ref 70–99)
Potassium: 4.1 mmol/L (ref 3.5–5.1)
Sodium: 137 mmol/L (ref 135–145)

## 2021-05-11 LAB — CBC
HCT: 45.6 % (ref 39.0–52.0)
Hemoglobin: 15.4 g/dL (ref 13.0–17.0)
MCH: 30 pg (ref 26.0–34.0)
MCHC: 33.8 g/dL (ref 30.0–36.0)
MCV: 88.7 fL (ref 80.0–100.0)
Platelets: 262 10*3/uL (ref 150–400)
RBC: 5.14 MIL/uL (ref 4.22–5.81)
RDW: 13.9 % (ref 11.5–15.5)
WBC: 6.7 10*3/uL (ref 4.0–10.5)
nRBC: 0 % (ref 0.0–0.2)

## 2021-05-11 MED ORDER — HYDRALAZINE HCL 25 MG PO TABS
37.5000 mg | ORAL_TABLET | Freq: Four times a day (QID) | ORAL | Status: DC
  Administered 2021-05-11 – 2021-05-15 (×15): 37.5 mg via ORAL
  Filled 2021-05-11 (×15): qty 2

## 2021-05-11 NOTE — Discharge Instructions (Signed)
Information on my medicine - ELIQUIS (apixaban)  This medication education was reviewed with me or my healthcare representative as part of my discharge preparation.  The pharmacist that spoke with me during my hospital stay was:  Onnie Boer, RPH-CPP  Why was Eliquis prescribed for you? Eliquis was prescribed for you to reduce the risk of a blood clot forming that can cause a stroke if you have a medical condition called atrial fibrillation (a type of irregular heartbeat).  What do You need to know about Eliquis ? Take your Eliquis TWICE DAILY - one tablet in the morning and one tablet in the evening with or without food. If you have difficulty swallowing the tablet whole please discuss with your pharmacist how to take the medication safely.  Take Eliquis exactly as prescribed by your doctor and DO NOT stop taking Eliquis without talking to the doctor who prescribed the medication.  Stopping may increase your risk of developing a stroke.  Refill your prescription before you run out.  After discharge, you should have regular check-up appointments with your healthcare provider that is prescribing your Eliquis.  In the future your dose may need to be changed if your kidney function or weight changes by a significant amount or as you get older.  What do you do if you miss a dose? If you miss a dose, take it as soon as you remember on the same day and resume taking twice daily.  Do not take more than one dose of ELIQUIS at the same time to make up a missed dose.  Important Safety Information A possible side effect of Eliquis is bleeding. You should call your healthcare provider right away if you experience any of the following: ? Bleeding from an injury or your nose that does not stop. ? Unusual colored urine (red or dark brown) or unusual colored stools (red or black). ? Unusual bruising for unknown reasons. ? A serious fall or if you hit your head (even if there is no bleeding).  Some  medicines may interact with Eliquis and might increase your risk of bleeding or clotting while on Eliquis. To help avoid this, consult your healthcare provider or pharmacist prior to using any new prescription or non-prescription medications, including herbals, vitamins, non-steroidal anti-inflammatory drugs (NSAIDs) and supplements.  This website has more information on Eliquis (apixaban): http://www.eliquis.com/eliquis/home     COMMUNITY REFERRALS UPON DISCHARGE:    Home Health:   PT, OT, [place] Equipment/Items Ordered:                                                 Agency/Supplier:

## 2021-05-11 NOTE — Progress Notes (Signed)
Physical Therapy Weekly Progress Note  Patient Details  Name: Jordan [name]. MRN: [id number] Date of Birth: 05-15-1946  Beginning of progress report period: May 02, 2021 End of progress report period: May 11, 2021  Today's Date: 05/11/2021 PT Individual Time: 0827-0952 PT Individual Time Calculation (min): 85 min   Patient has met 3 of 3 short term goals. Pt is making steady progress towards his goals. He has shown great participation and motivation to regain function and indep. He requires supervision for bed mobility, minA for transfers, and minA for gait up to ~~276f. He continues to be primarily limited by global deconditioning and weakness, decreased dynamic standing balance deficits, visual impairments, and gait deficits leading to increased falls risk.  Patient continues to demonstrate the following deficits muscle weakness, decreased cardiorespiratoy endurance, impaired timing and sequencing, unbalanced muscle activation and decreased coordination, decreased visual perceptual skills, decreased problem solving, decreased safety awareness and decreased memory and decreased standing balance, decreased postural control, hemiplegia and decreased balance strategies and therefore will continue to benefit from skilled PT intervention to increase functional independence with mobility.  Patient progressing toward long term goals..  Continue plan of care.  PT Short Term Goals Week 1:  PT Short Term Goal 1 (Week 1): Pt will ambulate 769fw/LRAD and min assist PT Short Term Goal 1 - Progress (Week 1): Met PT Short Term Goal 2 (Week 1): bed to/from wc w/LRAD and min assist PT Short Term Goal 2 - Progress (Week 1): Met PT Short Term Goal 3 (Week 1): Pt will perform car transfer w/safe technique w/LRAD and min assist, minimal cueing for safety. PT Short Term Goal 3 - Progress (Week 1): Met Week 2:  PT Short Term Goal 1 (Week 2): STG = LTG due to ELOS  Skilled Therapeutic  Interventions/Progress Updates:    Pt greeted supine in bed at start of session, agreeable to PT tx. No reports of pain. Reports he was up all night listening to [employer]which is his normal routine. Supine<>sit with supervision with hospital bed features, effortful. Donned hospital socks with setupA, increasing difficulty with LLE > RLE. Able to donn R tennis shoe with setupA and required minA for L shoe. Stand<>pivot transfer with CGA from EOB to w/c and transported to main rehab gym for time management. Completed additional stand<>pivot transfer with CGA to mat table with cues for safety awareness and technique.   Gait training [id number] RW and supervision (straight path) and CGA (turns). Cues for increasing stride length and stance time on L. No knee buckling or LOB but increased unsteadiness with turns.   NMR for dynamic standing balance on blue air-ex foam pad with minA for balance due to posterior bias. Worked on static standing with feet apart and eyes open, bicep curls and shldr press with 5#dowel rod, and bean bag toss to target - all requiring minA while standing on foam pad. Note BLE trembling while completing these activities. Also completed level ground standing chop/lift patterns + paloff press (5sec holds) with minA for balance with 3kg med ball. Several seated rest breaks needed b/w activities for recovery.   Stand<>pivot with CGA and RW from mat table to w/c with cues needed for safety approach and hand placement to armchair for controlled descent. Transported back to room for energy conservation. Performed stand<>pivot with minA from w/c to EOB with no AD, with use of hospital bedrail. Pt requesting to remain seated at end of session at EOB.  Bed alarm set, needs within reach, removed tennis shoes with maxA for time management (hospital socks remained on).   Therapy Documentation Precautions:  Precautions Precautions: Fall Precaution Comments:  diplopia Restrictions Weight Bearing Restrictions: No  Therapy/Group: Individual Therapy  Christian P Manhard PT 05/11/2021, 7:42 AM

## 2021-05-11 NOTE — Progress Notes (Signed)
Speech Language Pathology Daily Session Note  Patient Details  Name: Jordan [name]. MRN: [id number] Date of Birth: 02/21/1946  Today's Date: 05/11/2021 SLP Individual Time: 3235-5732 SLP Individual Time Calculation (min): 45 min  Short Term Goals: Week 2: SLP Short Term Goal 1 (Week 2): Patient will perform simulated medication management/organization using pill box with minA cues. SLP Short Term Goal 2 (Week 2): Patient will perform money management/calculation tasks with minA for accuracy and awareness to errors. SLP Short Term Goal 3 (Week 2): Patient will demonstrate awareness to and attempt to self correct errors during functional tasks with minA cues. SLP Short Term Goal 4 (Week 2): Patient will demonstrate effective use of learned strategies from PT, OT, ST with supervision A.  Skilled Therapeutic Interventions: Skilled SLP intervention focused on cognition. Pt completed pill organization task and used medication list to sort 3 pills with different times taken. He completed with Min A for error awareness x2 and for understanding of AM medication slots. He recalled 2 recommendations/strategies goven by PT to improve ambulation including standing up straight, picking feet up, and improving breathing. Improvement in understanding and completion of pill organizer today. Cont with therapy per plan of care.       Pain Pain Assessment Pain Scale: Faces Pain Score: 0-No pain Faces Pain Scale: No hurt  Therapy/Group: Individual Therapy  Gregary Signs A Kinsly Hild 05/11/2021, 8:08 AM

## 2021-05-11 NOTE — Progress Notes (Signed)
PROGRESS NOTE   Subjective/Complaints: Patient seen sitting up in bed this AM, working with therapies.  He states he slept well overnight.  He states he had a good weekend.   ROS: Denies CP, SOB, N/V/D  Objective:   No results found. Recent Labs    05/11/21 0749  WBC 6.7  HGB 15.4  HCT 45.6  PLT 262   Recent Labs    05/11/21 0749  NA 137  K 4.1  CL 112*  CO2 19*  GLUCOSE 112*  BUN 16  CREATININE 1.42*  CALCIUM 8.7*    Intake/Output Summary (Last 24 hours) at 05/11/2021 1137 Last data filed at 05/11/2021 0852 Gross per 24 hour  Intake 1065 ml  Output 500 ml  Net 565 ml        Physical Exam: Vital Signs Blood pressure (!) 148/104, pulse (!) 59, temperature 98.5 F (36.9 C), resp. rate 18, height 6' (1.829 m), weight 95.8 kg, SpO2 99 %.  Constitutional: No distress . Vital signs reviewed. HENT: Normocephalic.  Atraumatic. Eyes: EOMI. No discharge. Cardiovascular: No JVD.  Irregularly irregular. Respiratory: Normal effort.  No stridor.  Bilateral clear to auscultation. GI: Non-distended.  BS +. Skin: Warm and dry.  Intact. Psych: Normal mood.  Normal behavior. Musc: No edema in extremities.  No tenderness in extremities. Neuro: Alert Dysconjugate gaze. Diplopia, improving Motor: RUE/RLE: 5/5 proximal distal LUE/LLE: 4+/5 proximal to distal, stable Bilateral upper extremity dysmetria, improving   Assessment/Plan: 1. Functional deficits which require 3+ hours per day of interdisciplinary therapy in a comprehensive inpatient rehab setting.  Physiatrist is providing close team supervision and 24 hour management of active medical problems listed below.  Physiatrist and rehab team continue to assess barriers to discharge/monitor patient progress toward functional and medical goals  Care Tool:  Bathing    Body parts bathed by patient: Left arm,Right arm,Chest,Abdomen,Front perineal area,Buttocks,Right  upper leg,Left upper leg,Right lower leg,Left lower leg,Face         Bathing assist Assist Level: Supervision/Verbal cueing     Upper Body Dressing/Undressing Upper body dressing   What is the patient wearing?: Pull over shirt    Upper body assist Assist Level: Set up assist    Lower Body Dressing/Undressing Lower body dressing      What is the patient wearing?: Underwear/pull up,Pants     Lower body assist Assist for lower body dressing: Contact Guard/Touching assist     Toileting Toileting    Toileting assist Assist for toileting: Minimal Assistance - Patient > 75% Assistive Device Comment: urinal   Transfers Chair/bed transfer  Transfers assist     Chair/bed transfer assist level: Contact Guard/Touching assist     Locomotion Ambulation   Ambulation assist      Assist level: Contact Guard/Touching assist Assistive device: Walker-rolling Max distance: 130   Walk 10 feet activity   Assist     Assist level: Contact Guard/Touching assist Assistive device: Walker-rolling   Walk 50 feet activity   Assist Walk 50 feet with 2 turns activity did not occur: Safety/medical concerns  Assist level: Contact Guard/Touching assist Assistive device: Walker-rolling    Walk 150 feet activity   Assist Walk 150 feet  activity did not occur: Safety/medical concerns         Walk 10 feet on uneven surface  activity   Assist Walk 10 feet on uneven surfaces activity did not occur: Safety/medical concerns   Assist level: Moderate Assistance - Patient - 50 - 74% Assistive device: Hand held assist   Wheelchair     Assist Will patient use wheelchair at discharge?: No Type of Wheelchair: Manual    Wheelchair assist level: Supervision/Verbal cueing Max wheelchair distance: 55    Wheelchair 50 feet with 2 turns activity    Assist        Assist Level: Supervision/Verbal cueing   Wheelchair 150 feet activity     Assist           Medical Problem List and Plan: 1.  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  Continue CIR  Repeat head CT on 5/9, showing evolution of infarct.  2.  Antithrombotics: -DVT/anticoagulation:  Pharmaceutical: Eliquis             -antiplatelet therapy: Eliquis 3. Pain Management: N/A 4. Mood: LCSW to follow for evaluation and support.              -antipsychotic agents: N/a 5. Neuropsych: This patient is capable of making decisions on  own behalf. 6. Skin/Wound Care: Routine pressure-relief measures. 7. Fluids/Electrolytes/Nutrition: Monitor intake/output. 8.  HTN: Monitor blood pressures twice daily. Continue Losartan and coreg.              Hydralizine 10 TID started on 5/9, increase to 25mg  TID on 5/12, increased to 37.5 on 5/16   Vitals:   05/11/21 0514 05/11/21 1131  BP: 126/87 (!) 148/104  Pulse: 78 (!) 59  Resp: 18   Temp: 98.5 F (36.9 C)   SpO2: 99%     9.  Mild elevation in white count: Resolved  Monitor for fevers and other signs of infection.   10.  CKD IIIa:   Cr. 1.42 on 5/16  Cont to monitor 11.  Gout hx: Continue allopurinol             Monitor for flares 12. Afib rate             Monitor with increased exertion             Eliquis started   LOS: 10 days A FACE TO FACE EVALUATION WAS PERFORMED  Jauna Raczynski [name] 05/11/2021, 11:37 AM

## 2021-05-11 NOTE — Telephone Encounter (Signed)
Called and cancelled warfarin b/c pt was placed on eliquis at the hospital

## 2021-05-11 NOTE — Progress Notes (Signed)
Occupational Therapy Session Note  Patient Details  Name: Jordan [name]. MRN: [id number] Date of Birth: [birth year]  Today's Date: 05/11/2021 OT Individual Time: 1300-1401 OT Individual Time Calculation (min): 61 min    Short Term Goals: Week 1:  OT Short Term Goal 1 (Week 1): Pt will complete toilet transfer and Min A with LRAD OT Short Term Goal 2 (Week 1): Pt will locate ADL items needed for bathing with setup assist using compensatory strategies as needed OT Short Term Goal 3 (Week 1): Pt will complete oral care while standing at the sink without relying on forearm support for upright stability  Skilled Therapeutic Interventions/Progress Updates:    Pt received in bed and consented to OT tx. Pt seen for BUE strengthening HEP to increase strength and activity tolerance for ADLs and functional transfers. Pt wheeled outside to complete exercises seated in w/c. Pt instructed in elbow flexion, shoulder flexion, shoulder press, and shoulder abduction for 3x15 with min cuing for proper techniques with good carryover. Pt instructed in standing tolerance activity, completed 9 hole peg test while standing, scoring 41 seconds on R hand, 48 seconds on L hand. After seated rest break, pt instructed in standing task playing checkers to incorporate functional reach for dynamic standing balance. Pt cued for proper upright posture. After tx, pt helped back to room and left up in w/c with all needs met.   Therapy Documentation Precautions:  Precautions Precautions: Fall Precaution Comments: diplopia Restrictions Weight Bearing Restrictions: No   Vital Signs: Therapy Vitals Pulse Rate: (!) 59 BP: (!) 148/104 Pain: none     Therapy/Group: Individual Therapy  Abdurrahman Petersheim 05/11/2021, 1:39 PM

## 2021-05-11 NOTE — Addendum Note (Signed)
Addended by: Allean Found on: 05/11/2021 09:18 AM   Modules accepted: Orders

## 2021-05-11 NOTE — Progress Notes (Signed)
Physical Therapy Session Note  Patient Details  Name: Jordan [name]. MRN: [id number] Date of Birth: 08-30-1946  Today's Date: 05/11/2021 PT Individual Time: 1100-1130 PT Individual Time Calculation (min): 30 min   Short Term Goals: Week 1:  PT Short Term Goal 1 (Week 1): Pt will ambulate 33f w/LRAD and min assist PT Short Term Goal 1 - Progress (Week 1): Met PT Short Term Goal 2 (Week 1): bed to/from wc w/LRAD and min assist PT Short Term Goal 2 - Progress (Week 1): Met PT Short Term Goal 3 (Week 1): Pt will perform car transfer w/safe technique w/LRAD and min assist, minimal cueing for safety. PT Short Term Goal 3 - Progress (Week 1): Met Week 2:  PT Short Term Goal 1 (Week 2): STG = LTG due to ELOS Week 3:     Skilled Therapeutic Interventions/Progress Updates:    Pain:  Pt reports no pain.  Treatment to tolerance.  Rest breaks and repositioning as needed.  Pt initially resting in sidelying enjoying music and agreeable to treatment session w/focus on mobility, L attention. Pt supine to sit w/supervision.  stand pivot transfer to wc no AD w/cues for safety/completing turn w/Lhemibody.  Pt propels wc 126fusing bilat LEs w/frequent cues to utilize full ROM LLE/attend to L.  Sit to stand at stairs and repeated tapping w/LLE only w/cues for full positioning of L foot/attention to placement.  Stairs: ascends/descends 4 stairs w/2 rails wnad cues for safe placement LLE, advancement of LUE on rail.  Standing crossbody reaching using LUE to transfer horshoes from bedside table to/from basketball hoop (highest setting) cga x 6 min continuous no AD.  Gait 6022f/no AD, min assist, max cues to increase step length/achieve heelstrike consistently on L, pt can briefly correct but requires constant multimodal cueing.  Mild wobbles L knee and mild L trendelenberg at times.  stand pivot transfer wc to bed w/min assist and max cues for safety.  Sit to supine w/supervision, cues for L  hemibody positioning in bed.  Handed off to nurse in to check BP.    Therapy Documentation Precautions:  Precautions Precautions: Fall Precaution Comments: diplopia Restrictions Weight Bearing Restrictions: No    Therapy/Group: Individual Therapy  BarCallie FieldingT [place]16/2022, 12:50 PM

## 2021-05-12 NOTE — Progress Notes (Signed)
Occupational Therapy Weekly Progress Note  Patient Details  Name: Jordan [name]. MRN: [id number] Date of Birth: [birth year]  Beginning of progress report period: May 02, 2021 End of progress report period: May 12, 2021  Today's Date: 05/12/2021 OT Individual Time: 1093-2355 and  1400-1430 OT Individual Time Calculation (min): 45 min and 30 min    Patient has met 3 of 3 short term goals.  Pt is making excellent progress with his balance, L side coordination, left side awareness.    Patient continues to demonstrate the following deficits: decreased standing balance and decreased balance strategies and therefore will continue to benefit from skilled OT intervention to enhance overall performance with BADL and iADL.  Patient progressing toward long term goals..  Continue plan of care.  OT Short Term Goals Week 1:  OT Short Term Goal 1 (Week 1): Pt will complete toilet transfer and Min A with LRAD OT Short Term Goal 1 - Progress (Week 1): Met OT Short Term Goal 2 (Week 1): Pt will locate ADL items needed for bathing with setup assist using compensatory strategies as needed OT Short Term Goal 2 - Progress (Week 1): Met OT Short Term Goal 3 (Week 1): Pt will complete oral care while standing at the sink without relying on forearm support for upright stability OT Short Term Goal 3 - Progress (Week 1): Met Week 2:  OT Short Term Goal 1 (Week 2): STGs = LTGs  Skilled Therapeutic Interventions/Progress Updates:    Visit 1: no c/o pain   Pt seen for BADL retraining of toileting, bathing, and dressing with a focus on balance, coordination. Pt did very well - see ADL documentation. He stated he has had difficulty with donning socks for several years.  Had pt trial a sock aide.  Pt has some edema in LEs - donned knee high TED hose.  ADL Eating: Independent Grooming: Setup Where Assessed-Grooming: Standing at sink Upper Body Bathing: Setup Where Assessed-Upper Body Bathing: Shower Lower Body  Bathing: Supervision/safety Where Assessed-Lower Body Bathing: Shower Upper Body Dressing: Setup Where Assessed-Upper Body Dressing: Chair Lower Body Dressing: Contact guard Where Assessed-Lower Body Dressing: Chair Toileting: Minimal assistance Where Assessed-Toileting: Glass blower/designer: Psychiatric nurse Method: Ambulating (with RW) Science writer: Energy manager: Environmental education officer Method: Heritage manager: Shower seat with back,Grab bars Pt resting in recliner at end of session with belt alarm on and all needs met.  Visit 2: no c/o pain Pt resting in recliner stating he was extremely tired. Discussed his sleep habits and how he listens to skat jazz music until 2am. Discussed with pt the need for sleep and how that will help his body heal.  Pt transferred to wc and transported to gym.  Using RW ambulated to mat.  Worked on standing balance strength with sit to stands from mat holding a 2# dowel bar (so no arm support) and standing to reach bar up overhead. Added in torso twists in standing.  Pt did 10 reps, rested for 30 seconds and then continued repeating sets.  He progressed from min A to CGA to close S with NO UE support with rising to stand and light CGA with moving to sit. Hand off to PT for next session.    Therapy Documentation Precautions:  Precautions Precautions: Fall Precaution Comments: diplopia Restrictions Weight Bearing Restrictions: No    Vital Signs: Therapy Vitals Temp: 97.8 F (36.6 C) Temp Source: Oral Pulse Rate: 66 Resp:  17 BP: (!) 139/94 Patient Position (if appropriate): Sitting Oxygen Therapy SpO2: 99 % O2 Device: Room Air       Therapy/Group: Individual Therapy  [place] 05/12/2021, 4:46 PM

## 2021-05-12 NOTE — Progress Notes (Signed)
PROGRESS NOTE   Subjective/Complaints: Patient seen sitting up in bed this AM.  He states he slept well overnight.  He is looking forward to watching the basketball game tonight.   ROS: Denies CP, SOB, N/V/D  Objective:   No results found. Recent Labs    05/11/21 0749  WBC 6.7  HGB 15.4  HCT 45.6  PLT 262   Recent Labs    05/11/21 0749  NA 137  K 4.1  CL 112*  CO2 19*  GLUCOSE 112*  BUN 16  CREATININE 1.42*  CALCIUM 8.7*    Intake/Output Summary (Last 24 hours) at 05/12/2021 1201 Last data filed at 05/12/2021 0500 Gross per 24 hour  Intake 240 ml  Output 650 ml  Net -410 ml        Physical Exam: Vital Signs Blood pressure (!) 110/92, pulse (!) 56, temperature 97.7 F (36.5 C), temperature source Oral, resp. rate 18, height 6' (1.829 m), weight 95.8 kg, SpO2 100 %.  Constitutional: No distress . Vital signs reviewed. HENT: Normocephalic.  Atraumatic. Eyes: EOMI. No discharge. Cardiovascular: No JVD.  Irregularly irregular.  Respiratory: Normal effort.  No stridor.  Bilateral clear to auscultation. GI: Non-distended.  BS +. Skin: Warm and dry.  Intact. Psych: Normal mood.  Normal behavior. Musc: No edema in extremities.  No tenderness in extremities. Neuro: Alert Dysconjugate gaze. Diplopia, improving Motor: RUE/RLE: 5/5 proximal distal LUE/LLE: 4+/5 proximal to distal, unchanged Bilateral upper extremity dysmetria, stable  Assessment/Plan: 1. Functional deficits which require 3+ hours per day of interdisciplinary therapy in a comprehensive inpatient rehab setting.  Physiatrist is providing close team supervision and 24 hour management of active medical problems listed below.  Physiatrist and rehab team continue to assess barriers to discharge/monitor patient progress toward functional and medical goals  Care Tool:  Bathing    Body parts bathed by patient: Left arm,Right  arm,Chest,Abdomen,Front perineal area,Buttocks,Right upper leg,Left upper leg,Right lower leg,Left lower leg,Face         Bathing assist Assist Level: Supervision/Verbal cueing     Upper Body Dressing/Undressing Upper body dressing   What is the patient wearing?: Pull over shirt    Upper body assist Assist Level: Set up assist    Lower Body Dressing/Undressing Lower body dressing      What is the patient wearing?: Underwear/pull up,Pants     Lower body assist Assist for lower body dressing: Contact Guard/Touching assist     Toileting Toileting    Toileting assist Assist for toileting: Minimal Assistance - Patient > 75% Assistive Device Comment: urinal   Transfers Chair/bed transfer  Transfers assist     Chair/bed transfer assist level: Contact Guard/Touching assist     Locomotion Ambulation   Ambulation assist      Assist level: Contact Guard/Touching assist Assistive device: Walker-rolling Max distance: 130   Walk 10 feet activity   Assist     Assist level: Contact Guard/Touching assist Assistive device: Walker-rolling   Walk 50 feet activity   Assist Walk 50 feet with 2 turns activity did not occur: Safety/medical concerns  Assist level: Contact Guard/Touching assist Assistive device: Walker-rolling    Walk 150 feet activity   Assist  Walk 150 feet activity did not occur: Safety/medical concerns         Walk 10 feet on uneven surface  activity   Assist Walk 10 feet on uneven surfaces activity did not occur: Safety/medical concerns   Assist level: Moderate Assistance - Patient - 50 - 74% Assistive device: Hand held assist   Wheelchair     Assist Will patient use wheelchair at discharge?: No Type of Wheelchair: Manual    Wheelchair assist level: Supervision/Verbal cueing Max wheelchair distance: 55    Wheelchair 50 feet with 2 turns activity    Assist        Assist Level: Supervision/Verbal cueing    Wheelchair 150 feet activity     Assist          Medical Problem List and Plan: 1.  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  Continue CIR  Repeat head CT on 5/9, showing evolution of infarct.  2.  Antithrombotics: -DVT/anticoagulation:  Pharmaceutical: Eliquis             -antiplatelet therapy: Eliquis 3. Pain Management: N/A 4. Mood: LCSW to follow for evaluation and support.              -antipsychotic agents: N/a 5. Neuropsych: This patient is capable of making decisions on  own behalf. 6. Skin/Wound Care: Routine pressure-relief measures. 7. Fluids/Electrolytes/Nutrition: Monitor intake/output. 8.  HTN: Monitor blood pressures twice daily. Continue Losartan and coreg.              Hydralizine 10 TID started on 5/9, increase to 25mg  TID on 5/12, increased to 37.5 on 5/16   Vitals:   05/12/21 0500 05/12/21 1144  BP: (!) 118/95 (!) 110/92  Pulse: 67 (!) 56  Resp: 18   Temp: 97.7 F (36.5 C)   SpO2: 100% 100%   Improving on 5/17 9.  Mild elevation in white count: Resolved  Monitor for fevers and other signs of infection.   10.  CKD IIIa:   Cr. 1.42 on 5/16  Cont to monitor 11.  Gout hx: Continue allopurinol             Monitor for flares 12. Afib rate             Monitor with increased exertion             Eliquis started  Rate relatively controlled on 5/17   LOS: 11 days A FACE TO FACE EVALUATION WAS PERFORMED  Alexandro Line [name] 05/12/2021, 12:01 PM

## 2021-05-12 NOTE — Progress Notes (Signed)
Physical Therapy Session Note  Patient Details  Name: Jordan [name]. MRN: [id number] Date of Birth: 02-27-1946  Today's Date: 05/12/2021 PT Individual Time: 0900-1000 + 1435-1530 PT Individual Time Calculation (min): 60 min + 55 min   Short Term Goals: Week 2:  PT Short Term Goal 1 (Week 2): STG = LTG due to ELOS  Skilled Therapeutic Interventions/Progress Updates:     1st session: Pt received supine in bed, agreeable to PT tx. No reports of pain but reports generalized fatigue. Pt also reporting desire to return home but is understandable with continuing therapies in CIR setting. Discussed DC planning, PT goals and his current mobility status, and barriers to DC - pt voiced understanding of all. Pt needing some redirection to begin OOB mobility - when prompted to don his socks, he reports he hates putting socks on because it takes so much effort. He was able to don R sock with setupA but needed modA for L sock. Pt noted to have poor dynamic sitting balance while donning socks, frequently falling over to the L with poor righting/protective responses. Pt also required modA for donning pants due to difficulty threading LE's through pant legs. Sit<>stand with CGA from EOB and needs minA for standing balance while he pulls pants over hips. Squat<>pivot transfer with CGA from EOB to w/c with excessive forward flexed trunk noted. W/c transport for time management and energy conservation to dayroom rehab gym. Completed squat<>pivot with CGA to mat table with cues for safety approach and awareness. Completed "warm up" exercises including 1x15 sit<>stands with supervision and LAQ/hip marches to fatigue. Instructed on gait training x134ft with close supervision (fading to CGA with fatigue) and RW on level ground. Pt requesting to sit due to feeling fatigued and a little lightheaded. BP assessed in sitting/standing reading without changes - likely fatigue induced. Pt then instructed on repeated alternating  toe taps, 1x15, with RW support onto 7inch platform, needing CGA for balance. Returned to room for time management in w/c. Once in room, pt requesting urgent need to void - due to urgency, provided urinal but unfortunately pt missed and needed assist with changing pants and brief with modA. Pt returned to bed with minA via stand<>pivot and completed sit>supine with supervision. Able to boost himself up in bed with cues with BUE to reach for headboard. Pt remained supine in bed at end of session with bed alarm activated and needs in reach.   2nd session: Handoff of care from OT with pt sitting edge of mat in rehab gym. Pt agreeable to PT without reports of pain. Reports feeling better after getting a shower from assist with OT - reports he did most of the shower with little assist. Completed repeated sit<>stands with 2 #dowel rod with task overlay for shldr press & thoracic rotations combination. Also completed 1x10 sit<>stands while holding 3kg med ball with addition of overhead press for each stand, CGA provided for balance. Applied 4# ankle weights BLE and ambulated short distances with CGA and RW within gym and in // bars completed the following there-ex: -1x15 alternating hip marches w/ 4# ankle weight -1x10 unilateral straight leg hip abduction w/ 4# ankle weight.  *rest breaks provided for recovery   Gait training 2x47ft with CGA and RW - cues for increasing L>R heel strike and foot clearance/step length. Performed repeated alternating step-up/down 3x15 onto 6inch step with 2 hand rail support and CGA.  Stand<>pivot with CGA and no AD from mat table back to w/c. Returned to  room for energy conservation in w/c. Stand<>pivot with CGA and use of bedrail back to EOB. Pt requesting to remain seated EOB at end of session. Needs in reach and bed alarm on at end of session.   Therapy Documentation Precautions:  Precautions Precautions: Fall Precaution Comments: diplopia Restrictions Weight Bearing  Restrictions: No General:    Therapy/Group: Individual Therapy  Alger Simons 05/12/2021, 7:33 AM

## 2021-05-13 NOTE — Patient Care Conference (Signed)
Inpatient RehabilitationTeam Conference and Plan of Care Update Date: 05/13/2021   Time: 11:05 AM    Patient Name: Jordan [name].      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 Delice Lesch Care Coodinator Present: Dorien Chihuahua, RN, BSN, CRRN;Becky Dupree, LCSW Nurse Present: Dorien Chihuahua, RN PT Present: Ginnie Smart, PT OT Present: Meriel Pica, OT SLP Present: Other (comment) Gunnar Fusi, SLP) PPS Coordinator present : Gunnar Fusi, SLP     Current Status/Progress Goal Weekly Team Focus  Bowel/Bladder   Patient is continent of bowel and bladder  Patient will remain continent  Regular toileting, and improve mobilization and hydration   Swallow/Nutrition/ Hydration             ADL's   CGA with transfers, close S bathing, UB dressing, CGA LB dressing, min toileting  supervision  ADL training, balance, LUE coordination, pt education   Mobility   Supervision bed mobility, CGA transfers, minA gait 216ft wtih RW  mod I  Dynamic standing balance, pt education, safety awareness, DC planning, functional mobility, L inattention   Communication             Safety/Cognition/ Behavioral Observations  Min A  Mod I  mildly complex problem solving, emergent awareness and recall   Pain   Patient has not been complaining of pain  Patient will remian pain free till discharge  Regular pain assessment and proper medication if needed.    Skin   Patient skin remains intact, no issues  Maintain healthy skin integrity  Repositioning in bed, regular toileting and keeping skin clean and dry every after elimination     Discharge Planning:  Returning home alone with intermittent assist from sister and son, will not have 24/7 supervision   Team Discussion: Good progress noted.  Patient on target to meet rehab goals: yes, currently CGA for lower body dressing, toileting, shower staff transfers and walking. Supervision goals set for discharge.  *See Care Plan and progress notes for long and short-term goals.   Revisions to Treatment Plan:   Teaching Needs: Transfers, toileting, secondary stroke risk management, etc. Money management, medication management   Current Barriers to Discharge: Decreased caregiver support and Home enviroment access/layout  Possible Resolutions to Barriers: Family education     Medical Summary Current Status: Limited by diplopia with balance deficits and gait disorder secondary to subacute infarct in ventral and medial right thalamus with small amount of petechial hemorrhage.  Barriers to Discharge: Medical stability;Decreased family/caregiver support;Other (comments)  Barriers to Discharge Comments: Visual deficits Possible Resolutions to Barriers/Weekly Focus: Therapies, follow labs - Cr, optimize BP meds   Continued Need for Acute Rehabilitation Level of Care: The patient requires daily medical management by a physician with specialized training in physical medicine and rehabilitation for the following reasons: Direction of a multidisciplinary physical rehabilitation program to maximize functional independence : Yes Medical management of patient stability for increased activity  during participation in an intensive rehabilitation regime.: Yes Analysis of laboratory values and/or radiology reports with any subsequent need for medication adjustment and/or medical intervention. : Yes   I  attest that I was present, lead the team conference, and concur with the assessment and plan of the team.   Dorien Chihuahua B 05/13/2021, 2:11 PM

## 2021-05-13 NOTE — Progress Notes (Signed)
Physical Therapy Session Note  Patient Details  Name: Jordan [name]. MRN: [id number] Date of Birth: 1946/07/06  Today's Date: 05/13/2021 PT Individual Time: 2993-7169 + 1345-1415 PT Individual Time Calculation (min): 58 min  + 30 min  Short Term Goals: Week 2:  PT Short Term Goal 1 (Week 2): STG = LTG due to ELOS  Skilled Therapeutic Interventions/Progress Updates:     1st session: Pt received supine in bed, agreeable to PT tx. No reports of pain. Reports eagerness to DC home - reports he has several family members who will help him with daytime coverage. Discussed team conference and will f/u with team. Supine<>sit without assist with HOB flat and no bed rail. Donned disposable pants, socks, and shoes with setupA while seated EOB. Performed stand<>pivot transfer with close supervision to w/c, cues for safety and hand placement. W/c transport for time management to day room rehab gym. Gait training 2x161ft + 215ft with close supervision and RW - cues for increasing L step length and L foot clearance as well as keeping body within walker frame with upright posture. Nustep completed for 10 minutes at workload 4, both BUE/BLE, maintaining cadence of ~70 steps/minute - working on endurance and BLE strengthening. DC planning and pt education discussed throughout rest breaks during session. Pt able to remove tennis shoes without assist while seated EOB at end of session. Sit>supine with supervision with HOB flat and no bed features. Remained supine in bed with bed alarm activated and needs in reach at end of session.  2nd session: Pt greeted seated in w/c at start of session, handoff of care from SLP. Pt agreeable to PT session. No reports of pain. Co-treat with rec-therapist, Lattie Haw. W/c transport outdoors with totalA for energy conservation. While outdoors, discussed upcoming community outing for community reintegration to prepare for upcoming DC. While outdoors, also worked on dynamic standing  balance with standing ball toss (unweighted ball), CGA for balance - pt without LOB during activity and showed appropriate BUE coordination and reaction timing. Transported back upstairs for time management in w/c and completed stand<>pivot transfer with CGA back to bed with use of bedrail. Pt completed sit>supine with supervision and remained supine in bed with bed alarm on and needs in reach.   Therapy Documentation Precautions:  Precautions Precautions: Fall Precaution Comments: diplopia Restrictions Weight Bearing Restrictions: No General:     Therapy/Group: Individual Therapy  Alger Simons 05/13/2021, 7:36 AM

## 2021-05-13 NOTE — Progress Notes (Signed)
Patient ID: Jordan [name]., male   DOB: February 24, 1946, 75 y.o.   MRN: [id number]  Met with pt to discuss team conference progress toward his goals of supervision-mod/i and discharge 5/24. He is pleased with his progress this week and feels will be ready next Tuesday to go home. He is aware of the need for a rolling walker and is in agreement with. Will work on discharge needs for Tuesday

## 2021-05-13 NOTE — Progress Notes (Signed)
PROGRESS NOTE   Subjective/Complaints: Patient seen sitting up in bed this AM.  He states he slept well overnight.  He is appreciative of his care.   ROS: Denies CP, SOB, N/V/D  Objective:   No results found. Recent Labs    05/11/21 0749  WBC 6.7  HGB 15.4  HCT 45.6  PLT 262   Recent Labs    05/11/21 0749  NA 137  K 4.1  CL 112*  CO2 19*  GLUCOSE 112*  BUN 16  CREATININE 1.42*  CALCIUM 8.7*    Intake/Output Summary (Last 24 hours) at 05/13/2021 0927 Last data filed at 05/13/2021 0424 Gross per 24 hour  Intake --  Output 900 ml  Net -900 ml        Physical Exam: Vital Signs Blood pressure (!) 119/93, pulse (!) 49, temperature 97.8 F (36.6 C), temperature source Oral, resp. rate 19, height 6' (1.829 m), weight 95.8 kg, SpO2 99 %.  Constitutional: No distress . Vital signs reviewed. HENT: Normocephalic.  Atraumatic. Eyes: EOMI. No discharge. Cardiovascular: No JVD.  Irregularly irregular.  Respiratory: Normal effort.  No stridor.  Bilateral clear to auscultation. GI: Non-distended.  BS +. Skin: Warm and dry.  Intact. Psych: Normal mood.  Normal behavior. Musc: No edema in extremities.  No tenderness in extremities. Neuro: Alert Dysconjugate gaze: Diplopia, improving Motor: RUE/RLE: 5/5 proximal distal LUE/LLE: 4+/5 proximal to distal, stable Bilateral upper extremity dysmetria, stable  Assessment/Plan: 1. Functional deficits which require 3+ hours per day of interdisciplinary therapy in a comprehensive inpatient rehab setting.  Physiatrist is providing close team supervision and 24 hour management of active medical problems listed below.  Physiatrist and rehab team continue to assess barriers to discharge/monitor patient progress toward functional and medical goals  Care Tool:  Bathing    Body parts bathed by patient: Left arm,Right arm,Chest,Abdomen,Front perineal area,Buttocks,Right upper  leg,Left upper leg,Right lower leg,Left lower leg,Face         Bathing assist Assist Level: Supervision/Verbal cueing     Upper Body Dressing/Undressing Upper body dressing   What is the patient wearing?: Pull over shirt    Upper body assist Assist Level: Set up assist    Lower Body Dressing/Undressing Lower body dressing      What is the patient wearing?: Underwear/pull up,Pants     Lower body assist Assist for lower body dressing: Contact Guard/Touching assist     Toileting Toileting    Toileting assist Assist for toileting: Minimal Assistance - Patient > 75% Assistive Device Comment: urinal   Transfers Chair/bed transfer  Transfers assist     Chair/bed transfer assist level: Contact Guard/Touching assist     Locomotion Ambulation   Ambulation assist      Assist level: Contact Guard/Touching assist Assistive device: Walker-rolling Max distance: 130   Walk 10 feet activity   Assist     Assist level: Contact Guard/Touching assist Assistive device: Walker-rolling   Walk 50 feet activity   Assist Walk 50 feet with 2 turns activity did not occur: Safety/medical concerns  Assist level: Contact Guard/Touching assist Assistive device: Walker-rolling    Walk 150 feet activity   Assist Walk 150 feet activity did  not occur: Safety/medical concerns         Walk 10 feet on uneven surface  activity   Assist Walk 10 feet on uneven surfaces activity did not occur: Safety/medical concerns   Assist level: Moderate Assistance - Patient - 50 - 74% Assistive device: Hand held assist   Wheelchair     Assist Will patient use wheelchair at discharge?: No Type of Wheelchair: Manual    Wheelchair assist level: Supervision/Verbal cueing Max wheelchair distance: 55    Wheelchair 50 feet with 2 turns activity    Assist        Assist Level: Supervision/Verbal cueing   Wheelchair 150 feet activity     Assist           Medical Problem List and Plan: 1.  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  Copntinue CIR  Repeat head CT on 5/9, showing evolution of infarct.   Team conference today to discuss current and goals and coordination of care, home and environmental barriers, and discharge planning with nursing, case manager, and therapies. Please see conference note from today as well.  2.  Antithrombotics: -DVT/anticoagulation:  Pharmaceutical: Eliquis             -antiplatelet therapy: Eliquis 3. Pain Management: N/A 4. Mood: LCSW to follow for evaluation and support.              -antipsychotic agents: N/a 5. Neuropsych: This patient is capable of making decisions on  own behalf. 6. Skin/Wound Care: Routine pressure-relief measures. 7. Fluids/Electrolytes/Nutrition: Monitor intake/output. 8.  HTN: Monitor blood pressures twice daily. Continue Losartan and coreg.              Hydralizine 10 TID started on 5/9, increase to 25mg  TID on 5/12, increased to 37.5 on 5/16   Vitals:   05/12/21 1932 05/13/21 0421  BP: (!) 118/53 (!) 119/93  Pulse: (!) 58 (!) 49  Resp: 18 19  Temp: 98.6 F (37 C) 97.8 F (36.6 C)  SpO2: 100% 99%   Relatively controlled on 5/18 9.  Mild elevation in white count: Resolved  Monitor for fevers and other signs of infection.   10.  CKD IIIa:   Cr. 1.42 on 5/16  Cont to monitor 11.  Gout hx: Continue allopurinol             Monitor for flares 12. Afib rate             Monitor with increased exertion             Eliquis started  Rate relatively controlled on 5/18   LOS: 12 days A FACE TO FACE EVALUATION WAS PERFORMED  Franchesca Veneziano [name] 05/13/2021, 9:27 AM

## 2021-05-13 NOTE — Progress Notes (Signed)
Recreational Therapy Session Note  Patient Details  Name: Jordan [name]. MRN: [id number] Date of Birth: December 30, 1945 Today's Date: 05/13/2021  Pain: no c/o Skilled Therapeutic Interventions/Progress Updates: Session focused on dynamic standing balance on outdoor surfaces and discussion of community reintegration.  Pt stood without UE support for ball toss activity with contact guard assist.  Discussed purpose of community reintegration and pt quickly agreeable to participate in an outing to be scheduled this week.  Therapy/Group: Co-Treatment   Alezandra Egli 05/13/2021, 4:07 PM

## 2021-05-13 NOTE — Progress Notes (Signed)
Occupational Therapy Session Note  Patient Details  Name: Jordan [name]. MRN: [id number] Date of Birth: Sep 18, 1946  Today's Date: 05/13/2021 OT Individual Time: 1101-1156 OT Individual Time Calculation (min): 55 min    Short Term Goals: Week 1:  OT Short Term Goal 1 (Week 1): Pt will complete toilet transfer and Min A with LRAD OT Short Term Goal 1 - Progress (Week 1): Met OT Short Term Goal 2 (Week 1): Pt will locate ADL items needed for bathing with setup assist using compensatory strategies as needed OT Short Term Goal 2 - Progress (Week 1): Met OT Short Term Goal 3 (Week 1): Pt will complete oral care while standing at the sink without relying on forearm support for upright stability OT Short Term Goal 3 - Progress (Week 1): Met Week 2:  OT Short Term Goal 1 (Week 2): STGs = LTGs  Skilled Therapeutic Interventions/Progress Updates:     Pt received in bed and consented to OT tx. Pt able to don tennis shoes on this day without LOB while sitting EOB. Pt able to tie his own shoes today as well. Pt completed STS and walked with RW to sit in w/c with SUP and cuing for upright posture. Pt wheeled outside for instruction and training in [place] with 3#db to increase strength and activity tolerance for ADLs, IADLs, and functional transfers. Pt trained in unilateral exercises including elbow flexion, shoulder flexion, shoulder press, shoulder abduction, tricep extension, and chest press, all for 3x15 with min cuing for proper technique with good carryover. After exercises, pt instructed in standing tolerance activity at raised height table to facilitate functional reach for standing balance. After tx, pt helped back to room, left up in w/c and with all needs met.   Therapy Documentation Precautions:  Precautions Precautions: Fall Precaution Comments: diplopia Restrictions Weight Bearing Restrictions: No   Pain: none     Therapy/Group: Individual Therapy  Markavious Micco 05/13/2021, 12:09 PM

## 2021-05-13 NOTE — Progress Notes (Signed)
Inpatient Rehabilitation Care Coordinator Discharge Note  The overall goal for the admission was met for:   Discharge location: Yes-HOME ALONE WITH INTERMITTENT ASSIST FROM SISTER AND SON, ALONG WITH GIRLFRIEND  Length of Stay: Yes-18 DAYS  Discharge activity level: Yes-MOD/I-SUPERVISION LEVEL  Home/community participation: Yes  Services provided included: MD, RD, PT, OT, SLP, RN, CM, Pharmacy, Neuropsych and SW  Financial Services: Private Insurance: [place] offered to/list presented to:PT  Follow-up services arranged: Home Health: [facility], DME: [facility] [place] and Patient/Family has no preference for HH/DME agencies  Comments (or additional information):PT DID WELL AND REACHED MOD/I LEVEL GOALS, WILL HAVE SISTER AND SON CHECKING ON HIM AT DC  Patient/Family verbalized understanding of follow-up arrangements: Yes  Individual responsible for coordination of the follow-up plan: BARBARA-SISTER [phone]  Confirmed correct DME delivered: Elease Hashimoto 05/13/2021    Dupree, Gardiner Rhyme

## 2021-05-13 NOTE — Progress Notes (Signed)
Speech Language Pathology Daily Session Note  Patient Details  Name: Jordan [name]. MRN: [id number] Date of Birth: 1946/03/23  Today's Date: 05/13/2021 SLP Individual Time: 1300-1345 SLP Individual Time Calculation (min): 45 min  Short Term Goals: Week 2: SLP Short Term Goal 1 (Week 2): Patient will perform simulated medication management/organization using pill box with minA cues. SLP Short Term Goal 2 (Week 2): Patient will perform money management/calculation tasks with minA for accuracy and awareness to errors. SLP Short Term Goal 3 (Week 2): Patient will demonstrate awareness to and attempt to self correct errors during functional tasks with minA cues. SLP Short Term Goal 4 (Week 2): Patient will demonstrate effective use of learned strategies from PT, OT, ST with supervision A.  Skilled Therapeutic Interventions: Skilled SLP intervention focused on cognition. Pt navigated phone to locate information. He reported he still has difficulty and needs increased time when using phone since CVA. He demonstrated good awareness of physical deficits when discussing adjustments he will have to make when dc'd home. He questioned when he will be able to drive again and instructed to speak with MD and OT. He demonstrated good recall of rehab strategies and tasks completed in previous sessions. Cont with therapy per plan of care.      Pain Pain Assessment Pain Scale: Faces Faces Pain Scale: No hurt  Therapy/Group: Individual Therapy  Gregary Signs A Desarae Placide 05/13/2021, 1:40 PM

## 2021-05-14 MED ORDER — DICLOFENAC SODIUM 1 % EX GEL
2.0000 g | Freq: Four times a day (QID) | CUTANEOUS | Status: DC | PRN
  Filled 2021-05-14: qty 100

## 2021-05-14 NOTE — Progress Notes (Signed)
PROGRESS NOTE   Subjective/Complaints: Patient seen laying in bed this AM.  He states he slept well overnight.  He states he enjoyed watching the basketball game last night.  He notes improvement in vision.  ROS: Denies CP, SOB, N/V/D  Objective:   No results found. No results for input(s): WBC, HGB, HCT, PLT in the last 72 hours. No results for input(s): NA, K, CL, CO2, GLUCOSE, BUN, CREATININE, CALCIUM in the last 72 hours.  Intake/Output Summary (Last 24 hours) at 05/14/2021 1253 Last data filed at 05/14/2021 7510 Gross per 24 hour  Intake 200 ml  Output 1050 ml  Net -850 ml        Physical Exam: Vital Signs Blood pressure 108/83, pulse 83, temperature 98.1 F (36.7 C), resp. rate 18, height 6' (1.829 m), weight 95.8 kg, SpO2 100 %.  Constitutional: No distress . Vital signs reviewed. HENT: Normocephalic.  Atraumatic. Eyes: EOMI. No discharge. Cardiovascular: No JVD.  Irregularly irregular Respiratory: Normal effort.  No stridor.  Bilateral clear to auscultation. GI: Non-distended.  BS +. Skin: Warm and dry.  Intact. Psych: Normal mood.  Normal behavior. Musc: No edema in extremities.  No tenderness in extremities. Neuro: Alert Dysconjugate gaze: Diplopia, improving Motor: RUE/RLE: 5/5 proximal distal LUE/LLE: 4+/5 proximal to distal, unchanged Bilateral upper extremity dysmetria, stable  Assessment/Plan: 1. Functional deficits which require 3+ hours per day of interdisciplinary therapy in a comprehensive inpatient rehab setting.  Physiatrist is providing close team supervision and 24 hour management of active medical problems listed below.  Physiatrist and rehab team continue to assess barriers to discharge/monitor patient progress toward functional and medical goals  Care Tool:  Bathing    Body parts bathed by patient: Left arm,Right arm,Chest,Abdomen,Front perineal area,Buttocks,Right upper leg,Left  upper leg,Right lower leg,Left lower leg,Face         Bathing assist Assist Level: Supervision/Verbal cueing     Upper Body Dressing/Undressing Upper body dressing   What is the patient wearing?: Pull over shirt    Upper body assist Assist Level: Set up assist    Lower Body Dressing/Undressing Lower body dressing      What is the patient wearing?: Underwear/pull up,Pants     Lower body assist Assist for lower body dressing: Supervision/Verbal cueing     Toileting Toileting    Toileting assist Assist for toileting: Supervision/Verbal cueing Assistive Device Comment: urinal   Transfers Chair/bed transfer  Transfers assist     Chair/bed transfer assist level: Contact Guard/Touching assist     Locomotion Ambulation   Ambulation assist      Assist level: Contact Guard/Touching assist Assistive device: Walker-rolling Max distance: 130   Walk 10 feet activity   Assist     Assist level: Contact Guard/Touching assist Assistive device: Walker-rolling   Walk 50 feet activity   Assist Walk 50 feet with 2 turns activity did not occur: Safety/medical concerns  Assist level: Contact Guard/Touching assist Assistive device: Walker-rolling    Walk 150 feet activity   Assist Walk 150 feet activity did not occur: Safety/medical concerns         Walk 10 feet on uneven surface  activity   Assist Walk 10 feet  on uneven surfaces activity did not occur: Safety/medical concerns   Assist level: Moderate Assistance - Patient - 50 - 74% Assistive device: Hand held assist   Wheelchair     Assist Will patient use wheelchair at discharge?: No Type of Wheelchair: Manual    Wheelchair assist level: Supervision/Verbal cueing Max wheelchair distance: 55    Wheelchair 50 feet with 2 turns activity    Assist        Assist Level: Supervision/Verbal cueing   Wheelchair 150 feet activity     Assist          Medical Problem List and  Plan: 1.  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  Continue CIR  Repeat head CT on 5/9, showing evolution of infarct.  2.  Antithrombotics: -DVT/anticoagulation:  Pharmaceutical: Eliquis             -antiplatelet therapy: Eliquis 3. Pain Management: N/A 4. Mood: LCSW to follow for evaluation and support.              -antipsychotic agents: N/a 5. Neuropsych: This patient is capable of making decisions on  own behalf. 6. Skin/Wound Care: Routine pressure-relief measures. 7. Fluids/Electrolytes/Nutrition: Monitor intake/output. 8.  HTN: Monitor blood pressures twice daily. Continue Losartan and coreg.              Hydralizine 10 TID started on 5/9, increase to 25mg  TID on 5/12, increased to 37.5 on 5/16   Vitals:   05/14/21 0038 05/14/21 0537  BP: 129/89 108/83  Pulse:  83  Resp:  18  Temp:  98.1 F (36.7 C)  SpO2:  100%   Labile on 5/19, monitor for trend 9.  Mild elevation in white count: Resolved  Monitor for fevers and other signs of infection.   10.  CKD IIIa:   Cr.  1.42 on 5/16, labs ordered for tomorrow  Cont to monitor 11.  Gout hx: Continue allopurinol             Monitor for flares 12. Afib rate             Monitor with increased exertion             Eliquis started  Rate relatively controlled on 5/19   LOS: 13 days A FACE TO FACE EVALUATION WAS PERFORMED  Jordan [name] [name] 05/14/2021, 12:53 PM

## 2021-05-14 NOTE — Progress Notes (Signed)
Physical Therapy Session Note  Patient Details  Name: Jordan [name]. MRN: [id number] Date of Birth: 1946/07/14  Today's Date: 05/14/2021 PT Individual Time: 2536-6440 + 1300-1345 PT Individual Time Calculation (min): 43 min + 45 min  Short Term Goals: Week 2:  PT Short Term Goal 1 (Week 2): STG = LTG due to ELOS  Skilled Therapeutic Interventions/Progress Updates:     1st session: Pt greeted sitting in w/c, listening to his [employer]. Pt agreeable to PT tx, no report of pain. W/c transport to ortho gym for time management. Completed NMR with BITS system in unsupported standing with minA for balance - completed Geboard and visual/verbal memory sequencing. With [employer] task, pt able to complete mild complexity geoboard puzzles with ~75% accuracy and moderate complexity geobard puzzles with ~50% accuracy. Pt with decreased error recognition during tasks and needed guidance on differences b/w his geoboard and the correct geoboard. With visual/verbal memory sequencing, pt able to achieve up to 4 words prior to errors and also noted to have difficulty locating words in L peripheral visual field. Completed car transfer with car height set to that of a sedan - pt needing minA for car transfer and cues for safety approach as he attempted to transfer via lateral stepping instead of turning to sit prior to getting legs in.  Gait training ~257f with CGA and RW within hallways on level ground - cues for increasing thoracic extension and upright posture, maintaining body within walker frame, and improving L foot clearance. Pt remained seated in w/c at end of session with all needs met, pt appreciative of therapeutic interventions.  2nd session: Pt greeted seated in w/c, agreeable to PT tx, no reports of pain but does endorse generalized fatigue - requests light activity during session. W/c transport for time management to main rehab gym. Completed stand<>pivot transfer with CGA from w/c to mat table.  Completed repeated sit<>stands with 3# dowel rod with shldr press combo. Also instructed on romanian dead-lifts with 5# dowel rod - working on posterior chain facilitation with gluts and hamstrings. Completed standing horseshoes with CGA for balance and cues needed for improving postural awareness with upright positioning. Then instructed on unilateral forward/backward stepping with minA while unsupported over 2inch obstacle on floor  - increased difficulty stepping with RLE > LLE due to L knee instability in stance. Stand<>pivot transfer with CGA back to w/c and returned to room where he completed additional stand<>pivot transfer with CGA back to bed. Sit>supine with supervision and bed alarm on, needs in reach. RN present who assumed care.   Therapy Documentation Precautions:  Precautions Precautions: Fall Precaution Comments: diplopia Restrictions Weight Bearing Restrictions: No General:    Therapy/Group: Individual Therapy  CAlger Simons5/19/2022, 7:41 AM

## 2021-05-14 NOTE — Progress Notes (Signed)
Occupational Therapy Session Note  Patient Details  Name: Jordan [name]. MRN: [id number] Date of Birth: [birth year]  Today's Date: 05/14/2021 OT Individual Time: 0174-9449 OT Individual Time Calculation (min): 56 min    Short Term Goals: Week 1:  OT Short Term Goal 1 (Week 1): Pt will complete toilet transfer and Min A with LRAD OT Short Term Goal 1 - Progress (Week 1): Met OT Short Term Goal 2 (Week 1): Pt will locate ADL items needed for bathing with setup assist using compensatory strategies as needed OT Short Term Goal 2 - Progress (Week 1): Met OT Short Term Goal 3 (Week 1): Pt will complete oral care while standing at the sink without relying on forearm support for upright stability OT Short Term Goal 3 - Progress (Week 1): Met  Skilled Therapeutic Interventions/Progress Updates:    Pt received in bed in room and consented to OT tx. Pt seen for morning ADL routine including toileting, bathing, dressing, grooming, and functional transfer training. Pt completed all toileting tasks with close supervision and transfers with grab bars and RW with CGA. Pt had soiled brief upon arrival, unaware. Pt walked into walk in shower with SUP and grab bars, bathed with distant supervision with min cues for thoroughness and proficiency. Pt able to complete UBD with setup, SUP for LBD for safety. Pt required increased time to manage footwear while seated in wheelchair 2/2 fatigue. After ADL, remaining time utilized to instruct in level 2 orange theraband exercises including bicep curls and tricep extension for 3x15 with min cuing for proper tech with good carryover. After tx, pt left up in w/c with all needs met.   Therapy Documentation Precautions:  Precautions Precautions: Fall Precaution Comments: diplopia Restrictions Weight Bearing Restrictions: No Vital Signs: Therapy Vitals Temp: 98.1 F (36.7 C) Pulse Rate: 83 Resp: 18 BP: 108/83 Patient Position (if appropriate): Lying Oxygen  Therapy SpO2: 100 % O2 Device: Room Air Pain: Pain Assessment Pain Scale: 0-10 Pain Score: 0-No pain   Therapy/Group: Individual Therapy  Lauralyn Shadowens 05/14/2021, 9:13 AM

## 2021-05-14 NOTE — Progress Notes (Signed)
Speech Language Pathology Daily Session Note  Patient Details  Name: Jordan [name]. MRN: [id number] Date of Birth: July 19, 1946  Today's Date: 05/14/2021 SLP Individual Time: 1003-1045 SLP Individual Time Calculation (min): 42 min  Short Term Goals: Week 2: SLP Short Term Goal 1 (Week 2): Patient will perform simulated medication management/organization using pill box with minA cues. SLP Short Term Goal 2 (Week 2): Patient will perform money management/calculation tasks with minA for accuracy and awareness to errors. SLP Short Term Goal 3 (Week 2): Patient will demonstrate awareness to and attempt to self correct errors during functional tasks with minA cues. SLP Short Term Goal 4 (Week 2): Patient will demonstrate effective use of learned strategies from PT, OT, ST with supervision A.  Skilled Therapeutic Interventions: Skilled SLP intervention focused on cognition. Pt completed higher level deductive reaosning puzzle with max A. Cues faded to min-mod A once familiar with task but continued to demonstrate slow processing with written clues and alternating attention between 2 sets of information. He answered simple problem solving questions using medication list with supervision A visual cues. Pt left seated upright in chair with chair alarm set and call button within reach. Cont with therapy per plan of care.      Pain Pain Assessment Pain Scale: Faces Pain Score: 0-No pain Faces Pain Scale: No hurt  Therapy/Group: Individual Therapy  Darrol Poke Keniah Klemmer 05/14/2021, 10:44 AM

## 2021-05-15 LAB — BASIC METABOLIC PANEL
Anion gap: 5 (ref 5–15)
BUN: 17 mg/dL (ref 8–23)
CO2: 23 mmol/L (ref 22–32)
Calcium: 9.1 mg/dL (ref 8.9–10.3)
Chloride: 110 mmol/L (ref 98–111)
Creatinine, Ser: 1.54 mg/dL — ABNORMAL HIGH (ref 0.61–1.24)
GFR, Estimated: 47 mL/min — ABNORMAL LOW (ref >60)
Glucose, Bld: 104 mg/dL — ABNORMAL HIGH (ref 70–99)
Potassium: 4.3 mmol/L (ref 3.5–5.1)
Sodium: 138 mmol/L (ref 135–145)

## 2021-05-15 MED ORDER — HYDRALAZINE HCL 25 MG PO TABS
25.0000 mg | ORAL_TABLET | Freq: Four times a day (QID) | ORAL | Status: DC
  Administered 2021-05-15 – 2021-05-19 (×16): 25 mg via ORAL
  Filled 2021-05-15 (×16): qty 1

## 2021-05-15 NOTE — Progress Notes (Signed)
Occupational Therapy Session Note  Patient Details  Name: Jordan [name]. MRN: [id number] Date of Birth: April 19, 1946  Today's Date: 05/15/2021 OT Individual Time: 1115-1200 OT Individual Time Calculation (min): 45 min    Short Term Goals: Week 2:  OT Short Term Goal 1 (Week 2): STGs = LTGs  Skilled Therapeutic Interventions/Progress Updates:    Pt seen this session to reassess visual skills, focus on LE strength and coordination for balance and work on L sh strength: -pt is tracking target in all fields and did not demonstrate visual field cut -limited convergence of R eye -R eye sits higher than L. Pt claims he does not have diplopia most of the time but he does have it in his L visual fields which is remediated by taping of nasal side of R glasses lense.  -sit to stand from low mat with light CGA for safety 10 x2 -standing with alternating arms overhead reaching to stretch through torso -standing with torso twists to challenge balance - standing with wt shifts side to side while placing bean bags on elevated table. He found wt shifting to the L more challenging -standing and squating halfway 12 x2, then another set of 12 x3 with L foot on small step for increased L quad work. -seated overhead sh presses with 5 lb dowel bar 12 x 3.    Pt completed w/c >< mat transfers with RW with CGA.  Returned to room with belt alarm on and all needs met.  Pt participated very well.  Therapy Documentation Precautions:  Precautions Precautions: Fall Precaution Comments: diplopia Restrictions Weight Bearing Restrictions: No Pain: Pain Assessment Pain Score: 0-No pain ADL: ADL Eating: Independent Grooming: Setup Where Assessed-Grooming: Standing at sink Upper Body Bathing: Setup Where Assessed-Upper Body Bathing: Shower Lower Body Bathing: Supervision/safety Where Assessed-Lower Body Bathing: Shower Upper Body Dressing: Setup Where Assessed-Upper Body Dressing: Chair Lower Body  Dressing: Contact guard Where Assessed-Lower Body Dressing: Chair Toileting: Minimal assistance Where Assessed-Toileting: Glass blower/designer: Psychiatric nurse Method: Ambulating (with RW) Science writer: Energy manager: Environmental education officer Method: Heritage manager: Shower seat with back,Grab bars   Therapy/Group: Individual Therapy  [place] 05/15/2021, 12:42 PM

## 2021-05-15 NOTE — Progress Notes (Signed)
Patient offered miralax and prune juice. Refused at this time, states is not consipated.

## 2021-05-15 NOTE — Progress Notes (Signed)
PROGRESS NOTE   Subjective/Complaints: Patient seen sitting up in bed this morning.  He states he slept well overnight.  He denies complaints.  ROS: Denies CP, SOB, N/V/D  Objective:   No results found. No results for input(s): WBC, HGB, HCT, PLT in the last 72 hours. Recent Labs    05/15/21 0647  NA 138  K 4.3  CL 110  CO2 23  GLUCOSE 104*  BUN 17  CREATININE 1.54*  CALCIUM 9.1    Intake/Output Summary (Last 24 hours) at 05/15/2021 1030 Last data filed at 05/15/2021 0718 Gross per 24 hour  Intake 598 ml  Output 200 ml  Net 398 ml        Physical Exam: Vital Signs Blood pressure 112/66, pulse 65, temperature 98 F (36.7 C), temperature source Oral, resp. rate 16, height 6' (1.829 m), weight 95.8 kg, SpO2 100 %.  Constitutional: No distress . Vital signs reviewed. HENT: Normocephalic.  Atraumatic. Eyes: EOMI. No discharge. Cardiovascular: No JVD.  Irregularly irregular. Respiratory: Normal effort.  No stridor.  Bilateral clear to auscultation. GI: Non-distended.  BS +. Skin: Warm and dry.  Intact. Psych: Normal mood.  Normal behavior. Musc: No edema in extremities.  No tenderness in extremities. Neuro: Alert Dysconjugate gaze: Diplopia, improving Motor: RUE/RLE: 5/5 proximal distal LUE/LLE: 4+/5 proximal to distal, stable Bilateral upper extremity dysmetria, stable  Assessment/Plan: 1. Functional deficits which require 3+ hours per day of interdisciplinary therapy in a comprehensive inpatient rehab setting.  Physiatrist is providing close team supervision and 24 hour management of active medical problems listed below.  Physiatrist and rehab team continue to assess barriers to discharge/monitor patient progress toward functional and medical goals  Care Tool:  Bathing    Body parts bathed by patient: Left arm,Right arm,Chest,Abdomen,Front perineal area,Buttocks,Right upper leg,Left upper leg,Right  lower leg,Left lower leg,Face         Bathing assist Assist Level: Supervision/Verbal cueing     Upper Body Dressing/Undressing Upper body dressing   What is the patient wearing?: Pull over shirt    Upper body assist Assist Level: Set up assist    Lower Body Dressing/Undressing Lower body dressing      What is the patient wearing?: Underwear/pull up,Pants     Lower body assist Assist for lower body dressing: Supervision/Verbal cueing     Toileting Toileting    Toileting assist Assist for toileting: Supervision/Verbal cueing Assistive Device Comment: urinal   Transfers Chair/bed transfer  Transfers assist     Chair/bed transfer assist level: Contact Guard/Touching assist     Locomotion Ambulation   Ambulation assist      Assist level: Contact Guard/Touching assist Assistive device: Walker-rolling Max distance: 130   Walk 10 feet activity   Assist     Assist level: Contact Guard/Touching assist Assistive device: Walker-rolling   Walk 50 feet activity   Assist Walk 50 feet with 2 turns activity did not occur: Safety/medical concerns  Assist level: Contact Guard/Touching assist Assistive device: Walker-rolling    Walk 150 feet activity   Assist Walk 150 feet activity did not occur: Safety/medical concerns         Walk 10 feet on uneven surface  activity   Assist Walk 10 feet on uneven surfaces activity did not occur: Safety/medical concerns   Assist level: Moderate Assistance - Patient - 50 - 74% Assistive device: Hand held assist   Wheelchair     Assist Will patient use wheelchair at discharge?: No Type of Wheelchair: Manual    Wheelchair assist level: Supervision/Verbal cueing Max wheelchair distance: 55    Wheelchair 50 feet with 2 turns activity    Assist        Assist Level: Supervision/Verbal cueing   Wheelchair 150 feet activity     Assist          Medical Problem List and Plan: 1.  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  Continue CIR  Repeat head CT on 5/9, showing evolution of infarct.  2.  Antithrombotics: -DVT/anticoagulation:  Pharmaceutical: Eliquis             -antiplatelet therapy: Eliquis 3. Pain Management: N/A 4. Mood: LCSW to follow for evaluation and support.              -antipsychotic agents: N/a 5. Neuropsych: This patient is capable of making decisions on  own behalf. 6. Skin/Wound Care: Routine pressure-relief measures. 7. Fluids/Electrolytes/Nutrition: Monitor intake/output. 8.  HTN: Monitor blood pressures twice daily. Continue Losartan and coreg.              Hydralizine 10 TID started on 5/9, increase to 25mg  TID on 5/12, increased to 37.5 on 5/16, decreased to 25 on 5/20   Vitals:   05/15/21 0053 05/15/21 0550  BP: 103/84 112/66  Pulse:  65  Resp:  16  Temp:  98 F (36.7 C)  SpO2:  100%   Controlled/slightly soft on 5/20 9.  Mild elevation in white count: Resolved  Monitor for fevers and other signs of infection.   10.  CKD IIIa:   Cr.  1.54 on 5/20  Cont to monitor 11.  Gout hx: Continue allopurinol             Monitor for flares 12. Afib rate             Monitor with increased exertion             Eliquis started  Rate controlled on 5/20   LOS: 14 days A FACE TO FACE EVALUATION WAS PERFORMED  Amberlee Garvey [name] 05/15/2021, 10:30 AM

## 2021-05-15 NOTE — Progress Notes (Signed)
Speech Language Pathology Weekly Progress and Session Note  Patient Details  Name: Jordan [name]. MRN: [id number] Date of Birth: 1946-08-07  Beginning of progress report period: 05/08/2021 End of progress report period: 05/15/2021  Today's Date: 05/15/2021 SLP Individual Time: 1000-1030 SLP Individual Time Calculation (min): 30 min  Short Term Goals: Week 2: SLP Short Term Goal 1 (Week 2): Patient will perform simulated medication management/organization using pill box with minA cues. SLP Short Term Goal 1 - Progress (Week 2): Met SLP Short Term Goal 2 (Week 2): Patient will perform money management/calculation tasks with minA for accuracy and awareness to errors. SLP Short Term Goal 2 - Progress (Week 2): Met SLP Short Term Goal 3 (Week 2): Patient will demonstrate awareness to and attempt to self correct errors during functional tasks with minA cues. SLP Short Term Goal 3 - Progress (Week 2): Met SLP Short Term Goal 4 (Week 2): Patient will demonstrate effective use of learned strategies from PT, OT, ST with supervision A. SLP Short Term Goal 4 - Progress (Week 2): Met    New Short Term Goals: Week 3: SLP Short Term Goal 1 (Week 3): STG's=LTG's (ELOS: planned discharge 5/24)  Weekly Progress Updates:  Patient has demonstrated significant progress overall and met all 4 STG's. He is showing very good insight and awareness to his deficits, their impact as well as anticipation of needs upon discharge home. He continues to benefit from SLP intervention to achieve mod I cognitive goals.    Intensity: Minumum of 1-2 x/day, 30 to 90 minutes Frequency: 3 to 5 out of 7 days Duration/Length of Stay: 5/24 Treatment/Interventions: Cognitive remediation/compensation;Cueing hierarchy;Medication managment;Patient/family education;Functional tasks   Daily Session  Skilled Therapeutic Interventions:  Patient seen for skilled ST session with focus on cognitive function: awareness, problem  solving. Patient sitting in [facility] and alert very engaged in discussion and tasks. He demonstrated very good anticipatory awareness during discussion of upcoming discharge on 5/24, telling SLP he didn't think he would be able to handle having all his friends coming to visit and help; discussing alternative plans since he will not be able to drive, etc. He demonstrated ability to read and comprehend larger print medication labels with supervision A.  General    Pain Pain Assessment Pain Score: 0-No pain  Therapy/Group: Individual Therapy  Sonia Baller, MA, CCC-SLP Speech Therapy

## 2021-05-15 NOTE — Progress Notes (Signed)
Physical Therapy Session Note  Patient Details  Name: Jordan [name]. MRN: [id number] Date of Birth: Jul 13, 1946  Today's Date: 05/15/2021 PT Individual Time: 1400-1443 PT Individual Time Calculation (min): 43 min   Short Term Goals: Week 2:  PT Short Term Goal 1 (Week 2): STG = LTG due to ELOS  Skilled Therapeutic Interventions/Progress Updates:   Patient received sidelying in bed, agreeable to PT. He denies pain. He was able to come sit edge of bed with supervision and don sneakers with supervision and extended time to do so. Patient transferring to wc via stand pivot with CGA. PT transporting patient in wc to therapy gym for time management and energy conservation. Nustep completed 2x5 mins with extended rest break between due to fatigue. Patient ambulating ~69ft with RW and CGA to therapy mat. Completed dynamic standing balance + bimanual task with ball first and then 4# dowel: chest press 2x15, shoulder press 2x15. No loss of balance with these perturbations, but wide BOS maintained. Patient returning to room in wc, transferring to bed via stand pivot with CGA, returning supine with supervision. Bed alarm on, call light within reach.    Therapy Documentation Precautions:  Precautions Precautions: Fall Precaution Comments: diplopia Restrictions Weight Bearing Restrictions: No    Therapy/Group: Individual Therapy  Karoline Caldwell, PT, DPT, CBIS  05/15/2021, 7:47 AM

## 2021-05-15 NOTE — Progress Notes (Signed)
Physical Therapy Session Note  Patient Details  Name: Jordan [name]. MRN: [id number] Date of Birth: 1946-08-06  Today's Date: 05/15/2021 PT Individual Time: 0830-0945 PT Individual Time Calculation (min): 75 min   Short Term Goals: Week 2:  PT Short Term Goal 1 (Week 2): STG = LTG due to ELOS  Skilled Therapeutic Interventions/Progress Updates:    Pt greeted supine in bed at start of session. Agreeable to PT tx without reports of pain. Pt slow to move - benefits from encouragement and cues for initiation. Donned pants with minA while supine in bed - assist for LLE management. Supine<>sit completed with supervision with use of bed features and donned shoes with setupA while seated EOB. Completed stand<>pivot transfer with CGA to w/c and wheeled to day room gym for time management. Focused remainder of session of NMR with LiteGait over treadmill. Applied harness in standing with UE support to LiteGait. Requires minA for stepping up onto treadmill. Performed x3 trials of forward walking on treadmill at 0.75mph, covering a total of 116ft - seated rest breaks needed due to fatigue. SpO2 assessed reading 100% (unable to measure HR with dynamap due to Afib). On treadmill, pt demo's very short shuffling steps and decreased RLE weight shift - needs cues for increasing stride/step length and increasing L foot clearance. Placed visual cues/targets to work on stepping over obstacle  Also worked on side stepping towards R at 0.74mph on Treadmill, cues for increasing step length and widening BOS - difficulty producing adequate step length and RLE weight shift during side stepping. Walked backwards on Treadmill with CGA and harness still attached, required minA for stepping backwards off of treadmil and removed harness in standing with CGA for balance. Returned to room in w/c for time management and pt remained seated in w/c with needs in reach at end of session.  Therapy Documentation Precautions:   Precautions Precautions: Fall Precaution Comments: diplopia Restrictions Weight Bearing Restrictions: No General:    Therapy/Group: Individual Therapy  Alger Simons 05/15/2021, 7:43 AM

## 2021-05-16 NOTE — Progress Notes (Signed)
Physical Therapy Session Note  Patient Details  Name: Jordan [name]. MRN: [id number] Date of Birth: 20-Apr-1946  Today's Date: 05/16/2021 PT Individual Time: 0800-0900 PT Individual Time Calculation (min): 60 min   Short Term Goals: Week 2:  PT Short Term Goal 1 (Week 2): STG = LTG due to ELOS  Skilled Therapeutic Interventions/Progress Updates:    Pt agreeable to PT session, received supine in bed at start. Supine<>sit with supervision with bed features. Stand<>pivot transfer with supervision to w/c with cues for safety approach and hand placement. W/c transport outdoors to practice community ambulation. Gait outdoors on unlevel concrete with CGA and RW - ambulated 281f + 1546f+ 17533f cues needed for proximity to RW and keeping body within walker frame. No over LOB or knee buckling present. Gait distances limited by fatigue. While outdoors, also practiced transfers to different sitting surfaces (floating chair with no arm rests, bench with arm rests). While outdoors, completed seated there-e for UE strengthening to improve transfers and global strengthening, with 5# dowel rod completed chest press, bicep curls, and shldr press - reps completed to fatigue (~20) x3 sets. Returned back upstairs in w/c with totalA for energy conservation and pt requesting to remain seated in w/c. All needs met at end of session.   Therapy Documentation Precautions:  Precautions Precautions: Fall Precaution Comments: diplopia Restrictions Weight Bearing Restrictions: No General:    Therapy/Group: Individual Therapy  Jordan [name] PT 05/16/2021, 7:35 AM

## 2021-05-17 NOTE — Progress Notes (Signed)
Slept good. BP's in computer, manually checked this am=128/94. Using urinal without assistance. Jordan [name] A

## 2021-05-17 NOTE — Progress Notes (Signed)
PROGRESS NOTE   Subjective/Complaints: Patient seen sitting up in bed this morning.  He states he slept well overnight.  He notes improvement in vision.  He denies complaints.  He is appreciative of his care and looking forward to discharge soon.  ROS: Denies CP, SOB, N/V/D  Objective:   No results found. No results for input(s): WBC, HGB, HCT, PLT in the last 72 hours. Recent Labs    05/15/21 0647  NA 138  K 4.3  CL 110  CO2 23  GLUCOSE 104*  BUN 17  CREATININE 1.54*  CALCIUM 9.1    Intake/Output Summary (Last 24 hours) at 05/17/2021 0835 Last data filed at 05/17/2021 0500 Gross per 24 hour  Intake 720 ml  Output 1000 ml  Net -280 ml        Physical Exam: Vital Signs Blood pressure (!) 129/104, pulse 69, temperature 98.2 F (36.8 C), temperature source Oral, resp. rate 16, height 6' (1.829 m), weight 95.8 kg, SpO2 98 %.  Constitutional: No distress . Vital signs reviewed. HENT: Normocephalic.  Atraumatic. Eyes: EOMI. No discharge. Cardiovascular: No JVD.  Irregularly irregular. Respiratory: Normal effort.  No stridor.  Bilateral clear to auscultation. GI: Non-distended.  BS +. Skin: Warm and dry.  Intact. Psych: Normal mood.  Normal behavior. Musc: No edema in extremities.  No tenderness in extremities. Neuro: Alert Dysconjugate gaze: Diplopia, unchanged Motor: RUE/RLE: 5/5 proximal distal LUE/LLE: 4+/5 proximal to distal, stable Bilateral upper extremity dysmetria, stable  Assessment/Plan: 1. Functional deficits which require 3+ hours per day of interdisciplinary therapy in a comprehensive inpatient rehab setting.  Physiatrist is providing close team supervision and 24 hour management of active medical problems listed below.  Physiatrist and rehab team continue to assess barriers to discharge/monitor patient progress toward functional and medical goals  Care Tool:  Bathing    Body parts bathed by  patient: Left arm,Right arm,Chest,Abdomen,Front perineal area,Buttocks,Right upper leg,Left upper leg,Right lower leg,Left lower leg,Face         Bathing assist Assist Level: Supervision/Verbal cueing     Upper Body Dressing/Undressing Upper body dressing   What is the patient wearing?: Pull over shirt    Upper body assist Assist Level: Set up assist    Lower Body Dressing/Undressing Lower body dressing      What is the patient wearing?: Underwear/pull up,Pants     Lower body assist Assist for lower body dressing: Supervision/Verbal cueing     Toileting Toileting    Toileting assist Assist for toileting: Supervision/Verbal cueing Assistive Device Comment: urinal   Transfers Chair/bed transfer  Transfers assist     Chair/bed transfer assist level: Contact Guard/Touching assist     Locomotion Ambulation   Ambulation assist      Assist level: Contact Guard/Touching assist Assistive device: Walker-rolling Max distance: 20   Walk 10 feet activity   Assist     Assist level: Contact Guard/Touching assist Assistive device: Walker-rolling   Walk 50 feet activity   Assist Walk 50 feet with 2 turns activity did not occur: Safety/medical concerns  Assist level: Contact Guard/Touching assist Assistive device: Walker-rolling    Walk 150 feet activity   Assist Walk 150 feet activity  did not occur: Safety/medical concerns  Assist level: Contact Guard/Touching assist Assistive device: Walker-rolling    Walk 10 feet on uneven surface  activity   Assist Walk 10 feet on uneven surfaces activity did not occur: Safety/medical concerns   Assist level: Moderate Assistance - Patient - 50 - 74% Assistive device: Hand held assist   Wheelchair     Assist Will patient use wheelchair at discharge?: No Type of Wheelchair: Manual    Wheelchair assist level: Supervision/Verbal cueing Max wheelchair distance: 55    Wheelchair 50 feet with 2 turns  activity    Assist        Assist Level: Supervision/Verbal cueing   Wheelchair 150 feet activity     Assist          Medical Problem List and Plan: 1.  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  Continue CIR  Repeat head CT on 5/9, showing evolution of infarct.  2.  Antithrombotics: -DVT/anticoagulation:  Pharmaceutical: Eliquis             -antiplatelet therapy: Eliquis 3. Pain Management: N/A 4. Mood: LCSW to follow for evaluation and support.              -antipsychotic agents: N/a 5. Neuropsych: This patient is capable of making decisions on  own behalf. 6. Skin/Wound Care: Routine pressure-relief measures. 7. Fluids/Electrolytes/Nutrition: Monitor intake/output. 8.  HTN: Monitor blood pressures twice daily. Continue Losartan and coreg.              Hydralizine 10 TID started on 5/9, increase to 25mg  TID on 5/12, increased to 37.5 on 5/16, decreased to 25 on 5/20   Vitals:   05/17/21 0557 05/17/21 0719  BP: (!) 114/98 (!) 129/104  Pulse:  69  Resp:    Temp:    SpO2:     Diastolic elevations noted 9.  Mild elevation in white count: Resolved  Monitor for fevers and other signs of infection.   10.  CKD IIIa:   Cr.  1.54 on 5/20, labs ordered for tomorrow  Cont to monitor 11.  Gout hx: Continue allopurinol             Monitor for flares 12. Afib rate             Monitor with increased exertion             Eliquis started  Rate controlled on 5/22   LOS: 16 days A FACE TO FACE EVALUATION WAS PERFORMED  Wayne Brunker [name] 05/17/2021, 8:35 AM

## 2021-05-18 ENCOUNTER — Other Ambulatory Visit (HOSPITAL_COMMUNITY): Payer: Self-pay

## 2021-05-18 LAB — CBC
HCT: 43.7 % (ref 39.0–52.0)
Hemoglobin: 14.7 g/dL (ref 13.0–17.0)
MCH: 29.6 pg (ref 26.0–34.0)
MCHC: 33.6 g/dL (ref 30.0–36.0)
MCV: 87.9 fL (ref 80.0–100.0)
Platelets: 247 10*3/uL (ref 150–400)
RBC: 4.97 MIL/uL (ref 4.22–5.81)
RDW: 13.7 % (ref 11.5–15.5)
WBC: 6 10*3/uL (ref 4.0–10.5)
nRBC: 0 % (ref 0.0–0.2)

## 2021-05-18 LAB — BASIC METABOLIC PANEL
Anion gap: 8 (ref 5–15)
BUN: 16 mg/dL (ref 8–23)
CO2: 23 mmol/L (ref 22–32)
Calcium: 8.8 mg/dL — ABNORMAL LOW (ref 8.9–10.3)
Chloride: 107 mmol/L (ref 98–111)
Creatinine, Ser: 1.48 mg/dL — ABNORMAL HIGH (ref 0.61–1.24)
GFR, Estimated: 49 mL/min — ABNORMAL LOW (ref >60)
Glucose, Bld: 91 mg/dL (ref 70–99)
Potassium: 4.2 mmol/L (ref 3.5–5.1)
Sodium: 138 mmol/L (ref 135–145)

## 2021-05-18 MED ORDER — ATORVASTATIN CALCIUM 40 MG PO TABS
40.0000 mg | ORAL_TABLET | Freq: Every day | ORAL | 0 refills | Status: DC
  Filled 2021-05-18: qty 30, 30d supply, fill #0

## 2021-05-18 MED ORDER — CARVEDILOL 25 MG PO TABS
ORAL_TABLET | ORAL | 0 refills | Status: DC
  Filled 2021-05-18: qty 60, 30d supply, fill #0

## 2021-05-18 MED ORDER — LOSARTAN POTASSIUM 100 MG PO TABS
100.0000 mg | ORAL_TABLET | Freq: Every day | ORAL | 0 refills | Status: DC
  Filled 2021-05-18: qty 30, 30d supply, fill #0

## 2021-05-18 MED ORDER — APIXABAN 5 MG PO TABS
5.0000 mg | ORAL_TABLET | Freq: Two times a day (BID) | ORAL | 0 refills | Status: DC
  Filled 2021-05-18: qty 60, 30d supply, fill #0

## 2021-05-18 MED ORDER — ACETAMINOPHEN 325 MG PO TABS: 325.0000 mg | ORAL_TABLET | ORAL | Status: DC | PRN

## 2021-05-18 MED ORDER — HYDRALAZINE HCL 25 MG PO TABS
25.0000 mg | ORAL_TABLET | Freq: Four times a day (QID) | ORAL | 0 refills | Status: DC
  Filled 2021-05-18: qty 180, 45d supply, fill #0

## 2021-05-18 MED ORDER — DICLOFENAC SODIUM 1 % EX GEL
2.0000 g | Freq: Four times a day (QID) | CUTANEOUS | 0 refills | Status: DC | PRN
  Filled 2021-05-18: qty 200, 14d supply, fill #0

## 2021-05-18 MED ORDER — ALLOPURINOL 100 MG PO TABS
100.0000 mg | ORAL_TABLET | Freq: Every day | ORAL | 0 refills | Status: DC
  Filled 2021-05-18: qty 30, 30d supply, fill #0

## 2021-05-18 NOTE — Progress Notes (Signed)
Occupational Therapy Session Note  Patient Details  Name: Jordan [name]. MRN: [id number] Date of Birth: [birth year]  Today's Date: 05/18/2021 OT Individual Time: 1100-1200 OT Individual Time Calculation (min): 60 min    Short Term Goals: Week 1:  OT Short Term Goal 1 (Week 1): Pt will complete toilet transfer and Min A with LRAD OT Short Term Goal 1 - Progress (Week 1): Met OT Short Term Goal 2 (Week 1): Pt will locate ADL items needed for bathing with setup assist using compensatory strategies as needed OT Short Term Goal 2 - Progress (Week 1): Met OT Short Term Goal 3 (Week 1): Pt will complete oral care while standing at the sink without relying on forearm support for upright stability OT Short Term Goal 3 - Progress (Week 1): Met Week 2:  OT Short Term Goal 1 (Week 2): STGs = LTGs  Skilled Therapeutic Interventions/Progress Updates:    Pt seen for ADL training with family education with his sister and brother in law. Pt completed toileting and shower/dressing in room using walk in shower.  After self care completed, had pt practice tub bench transfers in ADL apt. Demonstrated to family bench set up and curtain placement. At this time, recommend S with self care for the first week and have his [place] check him off for mod I completion of self care. He is on the verge of being independent, but would prefer for HHOT to assess him in his home environment as he continues to have occasional issues with diplopia. Discussed safety with NO driving, kitchen safety. Family very involved and supportive. Pt and family with no other questions. Pt in room with belt alarm on and all needs met.  Therapy Documentation Precautions:  Precautions Precautions: Fall Precaution Comments: diplopia Restrictions Weight Bearing Restrictions: No    Vital Signs: Therapy Vitals Temp: (!) 97.4 F (36.3 C) Temp Source: Oral Pulse Rate: 62 Resp: 17 BP: (!) 139/115 Patient Position (if appropriate):  Sitting Oxygen Therapy SpO2: 100 % O2 Device: Room Air Pain: Pain Assessment Pain Score: 0-No pain ADL: ADL Eating: Independent Grooming: Setup Where Assessed-Grooming: Standing at sink Upper Body Bathing: Setup Where Assessed-Upper Body Bathing: Shower Lower Body Bathing: Supervision/safety Where Assessed-Lower Body Bathing: Shower Upper Body Dressing: Setup Where Assessed-Upper Body Dressing: Chair Lower Body Dressing: Supervision/safety Where Assessed-Lower Body Dressing: Chair Toileting: Supervision/safety Where Assessed-Toileting: Glass blower/designer: Distant supervision Armed forces technical officer Method: Ambulating (with RW) Science writer: Ambulance person Transfer: Distant supervision Tub/Shower Transfer Method: Magazine features editor: Close supervision Social research officer, government Method: Heritage manager: Civil engineer, contracting with back,Grab bars    Therapy/Group: Individual Therapy  [place] 05/18/2021, 1:05 PM

## 2021-05-18 NOTE — Progress Notes (Signed)
PROGRESS NOTE   Subjective/Complaints: No complaints this morning Looks forward to d/c tomorrow and asks about plan Cr down slightly yo 1.48, other labs stable Diastolic BP elevated  ROS: Denies CP, SOB, N/V/D  Objective:   No results found. Recent Labs    05/18/21 0635  WBC 6.0  HGB 14.7  HCT 43.7  PLT 247   Recent Labs    05/18/21 0635  NA 138  K 4.2  CL 107  CO2 23  GLUCOSE 91  BUN 16  CREATININE 1.48*  CALCIUM 8.8*    Intake/Output Summary (Last 24 hours) at 05/18/2021 1019 Last data filed at 05/18/2021 0900 Gross per 24 hour  Intake 900 ml  Output 800 ml  Net 100 ml        Physical Exam: Vital Signs Blood pressure (!) 129/96, pulse 67, temperature 98.1 F (36.7 C), temperature source Oral, resp. rate 18, height 6' (1.829 m), weight 95.8 kg, SpO2 100 %.  Gen: no distress, normal appearing HEENT: oral mucosa pink and moist, NCAT Cardio: Irregularly irregular. Chest: normal effort, normal rate of breathing Abd: soft, non-distended Ext: no edema Psych: pleasant, normal affect Skin: intact Musc: No edema in extremities.  No tenderness in extremities. Neuro: Alert Dysconjugate gaze: Diplopia, unchanged Motor: RUE/RLE: 5/5 proximal distal LUE/LLE: 4+/5 proximal to distal, stable Bilateral upper extremity dysmetria, stable   Assessment/Plan: 1. Functional deficits which require 3+ hours per day of interdisciplinary therapy in a comprehensive inpatient rehab setting.  Physiatrist is providing close team supervision and 24 hour management of active medical problems listed below.  Physiatrist and rehab team continue to assess barriers to discharge/monitor patient progress toward functional and medical goals  Care Tool:  Bathing    Body parts bathed by patient: Left arm,Right arm,Chest,Abdomen,Front perineal area,Buttocks,Right upper leg,Left upper leg,Right lower leg,Left lower leg,Face          Bathing assist Assist Level: Supervision/Verbal cueing     Upper Body Dressing/Undressing Upper body dressing   What is the patient wearing?: Pull over shirt    Upper body assist Assist Level: Set up assist    Lower Body Dressing/Undressing Lower body dressing      What is the patient wearing?: Underwear/pull up,Pants     Lower body assist Assist for lower body dressing: Supervision/Verbal cueing     Toileting Toileting    Toileting assist Assist for toileting: Supervision/Verbal cueing Assistive Device Comment: urinal   Transfers Chair/bed transfer  Transfers assist     Chair/bed transfer assist level: Contact Guard/Touching assist     Locomotion Ambulation   Ambulation assist      Assist level: Contact Guard/Touching assist Assistive device: Walker-rolling Max distance: 20   Walk 10 feet activity   Assist     Assist level: Contact Guard/Touching assist Assistive device: Walker-rolling   Walk 50 feet activity   Assist Walk 50 feet with 2 turns activity did not occur: Safety/medical concerns  Assist level: Contact Guard/Touching assist Assistive device: Walker-rolling    Walk 150 feet activity   Assist Walk 150 feet activity did not occur: Safety/medical concerns  Assist level: Contact Guard/Touching assist Assistive device: Walker-rolling    Walk 10 feet  on uneven surface  activity   Assist Walk 10 feet on uneven surfaces activity did not occur: Safety/medical concerns   Assist level: Moderate Assistance - Patient - 50 - 74% Assistive device: Hand held assist   Wheelchair     Assist Will patient use wheelchair at discharge?: No Type of Wheelchair: Manual    Wheelchair assist level: Supervision/Verbal cueing Max wheelchair distance: 55    Wheelchair 50 feet with 2 turns activity    Assist        Assist Level: Supervision/Verbal cueing   Wheelchair 150 feet activity     Assist          Medical  Problem List and Plan: 1.  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  Continue CIR  Repeat head CT on 5/9, showing evolution of infarct.  2.  Antithrombotics: -DVT/anticoagulation:  Pharmaceutical: Eliquis             -antiplatelet therapy: Eliquis 3. Pain Management: N/A 4. Mood: LCSW to follow for evaluation and support.              -antipsychotic agents: N/a 5. Neuropsych: This patient is capable of making decisions on  own behalf. 6. Skin/Wound Care: Routine pressure-relief measures. 7. Fluids/Electrolytes/Nutrition: Monitor intake/output. 8.  HTN: Monitor blood pressures twice daily. Continue Losartan and coreg.              Hydralizine 10 TID started on 5/9, increase to 25mg  TID on 5/12, increased to 37.5 on 5/16, decreased to 25 on 5/20   Vitals:   05/18/21 0444 05/18/21 0716  BP: (!) 137/95 (!) 129/96  Pulse: 60 67  Resp: 18   Temp: 98.1 F (36.7 C)   SpO2: 450%    Diastolic elevations noted, continue to monitor.  9.  Mild elevation in white count: Resolved  Monitor for fevers and other signs of infection.   10.  CKD IIIa:   Cr.  1.54 on 5/20, trending down 5/23, monitor outpatient.   Cont to monitor 11.  Gout hx: Continue allopurinol             Monitor for flares 12. Afib rate             Monitor with increased exertion             Eliquis started, continue  Rate controlled on 5/23   LOS: 17 days A FACE TO [place] 05/18/2021, 10:19 AM

## 2021-05-18 NOTE — Progress Notes (Signed)
Speech Language Pathology Discharge Summary  Patient Details  Name: Jordan [name]. MRN: [id number] Date of Birth: 28-Jan-1946  Today's Date: 05/18/2021 SLP Individual Time: 0815-0900 SLP Individual Time Calculation (min): 45 min   Skilled Therapeutic Interventions:  Skilled SLP intervention focused on cognition and pt education. Educated pt on use of pill organizer, getting a Corporate investment banker and writing information down. Pt completed higher level complex problem solving task with money calculations with mod I. verbal cues needed at beginning due to unfamiliarity with task but completed accurately once familiar. Pt demonstrated good awareness of physical deficits and assistance he will need with tasks once home. He shared his personal goal to ride his bike again but demonstrated good understanding that he will need a lot more time before he is physically able to ride on a bike safely. Dc from skilled SLP intervention.    Patient has met 3 of 3 long term goals.  Patient to discharge at overall Modified Independent level.       Clinical Impression/Discharge Summary:   Pt has met 3 of 3 long term goals this admission. He has improved problem solving skills, memory, and awareness of deficits and physical limitations.He requires intermittent supervision with complex cognitive tasks for self monitoring and error awareness. Education completed with patient on strategies to increase recall of information. Pt will dc home with 24 hour supervision from family (sister). Dc from skilled SLP intervention.   Care Partner:  Caregiver Able to Provide Assistance: Yes;No  Type of Caregiver Assistance: Physical;Cognitive  Recommendation:  Home Health SLP  Rationale for SLP Follow Up: Maximize functional communication;Maximize cognitive function and independence;Reduce caregiver burden   Equipment: none at this time   Reasons for discharge: Discharged from hospital   Patient/Family Agrees with Progress  Made and Goals Achieved: Yes    Gregary Signs A Ayari Liwanag 05/18/2021, 9:00 AM

## 2021-05-18 NOTE — Progress Notes (Signed)
Physical Therapy Session Note  Patient Details  Name: Jordan [name]. MRN: [id number] Date of Birth: [birth year]  Today's Date: 05/18/2021 PT Individual Time: 1430-1500 PT Individual Time Calculation (min): 30 min   Short Term Goals: Week 2:  PT Short Term Goal 1 (Week 2): STG = LTG due to ELOS  Skilled Therapeutic Interventions/Progress Updates: Pt presents sitting in w/c and on phone w/ insurance issue.  PT started 53' late.  Pt amb multiple trials up to 150' w/ mod I and occasional verbal cues for posture and walker management especially w/ turns.  Pt performed negotiation of cones for maintaining RW for safety and balance.  Pt amb w/o AD to pick up cones from floor w/ initial assist of CGA to Close supervision but as fatigued, required min A.  Pt amb 150' back to room and remained in w/c w/ chair alarm on and all needs in reach.  Friend present in room.     Therapy Documentation Precautions:  Precautions Precautions: Fall Precaution Comments: diplopia Restrictions Weight Bearing Restrictions: No General:   Vital Signs: Therapy Vitals Temp: (!) 97.4 F (36.3 C) Temp Source: Oral Pulse Rate: 62 Resp: 17 BP: (!) 139/115 Patient Position (if appropriate): Sitting Oxygen Therapy SpO2: 100 % O2 Device: Room Air Pain:0/10 Pain Assessment Pain Score: 0-No pain Mobility:   Locomotion : Gait Ambulation: Yes Gait Distance (Feet): 150 Feet Assistive device: Rolling walker Gait Gait: Yes Gait Pattern: Within Functional Limits Gait Pattern: Step-through pattern;Decreased step length - left;Decreased weight shift to left;Trunk flexed;Narrow base of support;Poor foot clearance - left Gait velocity: decreased Stairs / Additional Locomotion Stairs: Yes Stairs Assistance: Contact Guard/Touching assist Stair Management Technique: Two rails Number of Stairs: 4 Height of Stairs: 6 Ramp: Supervision/Verbal cueing  Trunk/Postural Assessment : Postural Control Postural  Control: Within Functional Limits  Balance:   Exercises:   Other Treatments:      Therapy/Group: Individual Therapy  Ladoris Gene 05/18/2021, 3:23 PM

## 2021-05-18 NOTE — Discharge Summary (Signed)
Physical Therapy Discharge Summary  Patient Details  Name: Jordan [name]. MRN: [id number] Date of Birth: 1946/03/11  Today's Date: 05/18/2021 PT Individual Time: 0915-1030 PT Individual Time Calculation (min): 75 min    Patient has met 14 of 14 long term goals due to improved activity tolerance, improved balance, improved postural control, increased strength, decreased pain, ability to compensate for deficits, improved attention and improved awareness.  Patient to discharge at an ambulatory level Modified Independent.   Patient's care partner is independent to provide the necessary physical and cognitive assistance at discharge.  Reasons goals not met: n/a  Recommendation:  Patient will benefit from ongoing skilled PT services in home health setting to continue to advance safe functional mobility, address ongoing impairments in generalized deconditioning, global endurance deficits, gait and dynamic balance deficits in order to minimize fall risk.  Equipment: RW  Reasons for discharge: treatment goals met and discharge from hospital  Patient/family agrees with progress made and goals achieved: Yes  PT Discharge Precautions/Restrictions Restrictions Weight Bearing Restrictions: No Vision/Perception  Vision - Assessment Eye Alignment: Impaired (comment) Ocular Range of Motion: Restricted looking down;Other (comment) (restricted convergence) Tracking/Visual Pursuits: Decreased smoothness of eye movement to RIGHT inferior field;Decreased smoothness of horizontal tracking;Decreased smoothness of vertical tracking;Impaired - to be further tested in functional context Convergence: Impaired - to be further tested in functional context Perception Perception: Impaired Comments: Mild L inattention Praxis Praxis: Intact  Cognition Overall Cognitive Status: Impaired/Different from baseline Arousal/Alertness: Awake/alert Orientation Level: Oriented X4 Attention:  Alternating Selective Attention: Appears intact Selective Attention Impairment: Verbal complex;Functional complex Alternating Attention: Impaired Alternating Attention Impairment: Verbal complex;Functional complex Memory: Impaired Memory Impairment: Retrieval deficit;Other (comment) Awareness: Appears intact Problem Solving: Impaired Problem Solving Impairment: Functional complex Executive Function: Self Monitoring;Self Correcting Organizing: Impaired Organizing Impairment: Verbal complex;Functional complex Self Monitoring: Impaired Self Monitoring Impairment: Verbal complex;Functional complex Self Correcting: Impaired Self Correcting Impairment: Verbal complex;Functional complex Safety/Judgment: Appears intact Sensation Sensation Light Touch: Appears Intact Hot/Cold: Appears Intact Proprioception: Appears Intact Stereognosis: Appears Intact Coordination Gross Motor Movements are Fluid and Coordinated: Yes Fine Motor Movements are Fluid and Coordinated: No Finger Nose Finger Test: Improved accuracy with glasses - otherwise WFL Heel Shin Test: Efforful and slowed L vs R Motor  Motor Motor: Within Functional Limits Motor - Discharge Observations: L sided weakness improved since date of evaluation. Global weakness/deconditioning still present  Mobility Bed Mobility Bed Mobility: Rolling Right;Rolling Left;Supine to Sit;Sit to Supine Rolling Right: Independent Rolling Left: Independent Supine to Sit: Independent Sit to Supine: Independent Transfers Transfers: Sit to Stand;Stand to Sit;Stand Pivot Transfers Sit to Stand: Independent with assistive device Stand to Sit: Independent with assistive device Stand Pivot Transfers: Independent with assistive device Transfer (Assistive device): Rolling walker Locomotion  Gait Ambulation: Yes Gait Distance (Feet): 150 Feet Assistive device: Rolling walker Gait Gait: Yes Gait Pattern: Within Functional Limits Gait Pattern:  Step-through pattern;Decreased step length - left;Decreased weight shift to left;Trunk flexed;Narrow base of support;Poor foot clearance - left Gait velocity: decreased Stairs / Additional Locomotion Stairs: Yes Stairs Assistance: Contact Guard/Touching assist Stair Management Technique: Two rails Number of Stairs: 4 Height of Stairs: 6 Ramp: Supervision/Verbal cueing  Trunk/Postural Assessment  Cervical Assessment Cervical Assessment: Exceptions to [facility] (foward head) Thoracic Assessment Thoracic Assessment: Exceptions to [facility] (rounded shoulders and mild kyphosis) Lumbar Assessment Lumbar Assessment: Exceptions to [facility] (post pelvic tilt) Postural Control Postural Control: Within Functional Limits Righting Reactions: Delayed L Protective Responses: Delayed L  Balance Balance Balance Assessed: Yes Static Sitting Balance  Static Sitting - Balance Support: Feet supported Static Sitting - Level of Assistance: 7: Independent Dynamic Sitting Balance Dynamic Sitting - Balance Support: Feet supported;During functional activity Dynamic Sitting - Level of Assistance: 6: Modified independent (Device/Increase time) Static Standing Balance Static Standing - Balance Support: Bilateral upper extremity supported Static Standing - Level of Assistance: 6: Modified independent (Device/Increase time) Dynamic Standing Balance Dynamic Standing - Balance Support: Bilateral upper extremity supported Dynamic Standing - Level of Assistance: 6: Modified independent (Device/Increase time) Extremity Assessment  RLE Assessment RLE Assessment: Within Functional Limits LLE Assessment LLE Assessment: Within Functional Limits General Strength Comments: Grossly 4-/5  Skilled Intervention: Pt greeted supine in bed at start of session, agreeable to PT tx. No reports of pain - eager to DC home tomorrow. Supine<>sit mod I with bed features. Sit's EOB indep without UE support. Donned tennis shoes with maxA for time  management. Stand<>pivot transfer with supervision and no AD from EOB to w/c (is able to complete stand<>pivot transfers with RW mod I). W/c transport for time management to ortho gym. Completed car transfer with supervision and RW - cues for safety approach and turning before sitting rather than performing lateral stepping approach. Ambulated up/down 50f ramp with supervision and RW to practice community ambulation on unlevel surfaces. Gait training [id number] I with RW. Able to perform furniture transfers mod I with RW in ADL apartment room and completes bed mobility on regular flat bed indep as well. Completed stair training, able to navigate up/down x4 steps with CGA and bilateral hand rail support - step-to pattern for ascent and reciprocal pattern for descent - cues needed for advancing L arm along hand rail. Sister arriving during session for family education - discussed DME rec's, role of f/u therapies, home safety training, use of RW at all times when mobilizing, fall prevention strategies, and utilizing family assist as needed. All questions and concerns addressed and pt/family appreciative. Pt ended session seated in w/c at end of session with family at bedside.   5xSTS 28 seconds  ChriMarriott DPT 05/18/2021, 10:01 AM

## 2021-05-18 NOTE — Progress Notes (Signed)
Occupational Therapy Discharge Summary  Patient Details  Name: Jordan [name]. MRN: [id number] Date of Birth: 1946-02-10  Patient has met 10 of 10 long term goals due to improved activity tolerance, improved balance, postural control, ability to compensate for deficits, improved attention, improved awareness and improved coordination.  Patient to discharge at overall distant Supervision level with bathroom transfers and dressing after shower. Patient's care partner is independent to provide the necessary physical and cognitive assistance at discharge.    At this time, recommend S with self care for the first week and have his [place] check him off for mod I completion of self care. He is on the verge of being independent, but would prefer for HHOT to assess him in his home environment as he continues to have occasional issues with diplopia.   Reasons goals not met: n/a  Recommendation:  Patient will benefit from ongoing skilled OT services in home health setting to continue to advance functional skills in the area of BADL and iADL.  Equipment: tub bench  Reasons for discharge: treatment goals met  Patient/family agrees with progress made and goals achieved: Yes  OT Discharge Precautions/Restrictions  Precautions Precaution Comments: diplopia  ADL ADL Eating: Independent Grooming: Setup Where Assessed-Grooming: Standing at sink Upper Body Bathing: Setup Where Assessed-Upper Body Bathing: Shower Lower Body Bathing: Supervision/safety Where Assessed-Lower Body Bathing: Shower Upper Body Dressing: Setup Where Assessed-Upper Body Dressing: Chair Lower Body Dressing: Supervision/safety Where Assessed-Lower Body Dressing: Chair Toileting: Supervision/safety Where Assessed-Toileting: Glass blower/designer: Distant supervision Armed forces technical officer Method: Ambulating (with RW) Science writer: Ambulance person Transfer: Distant supervision Tub/Shower Transfer Method:  Magazine features editor: Close supervision Social research officer, government Method: Heritage manager: Civil engineer, contracting with back,Grab bars Vision Baseline Vision/History: Wears glasses Wears Glasses: At all times Patient Visual Report: Diplopia Vision Assessment?: Vision impaired- to be further tested in functional context Eye Alignment: Impaired (comment) Ocular Range of Motion: Restricted looking down;Other (comment) (restricted convergence) Tracking/Visual Pursuits: Decreased smoothness of eye movement to RIGHT inferior field;Decreased smoothness of horizontal tracking;Decreased smoothness of vertical tracking;Impaired - to be further tested in functional context Convergence: Impaired - to be further tested in functional context Perception  Perception: Impaired Comments: Mild L inattention Praxis Praxis: Intact Cognition Overall Cognitive Status: Impaired/Different from baseline Memory: Impaired Memory Impairment: Retrieval deficit;Other (comment) Problem Solving: Impaired Problem Solving Impairment: Functional complex Organizing: Impaired Organizing Impairment: Verbal complex;Functional complex Sensation Sensation Light Touch: Appears Intact Hot/Cold: Appears Intact Proprioception: Appears Intact Stereognosis: Appears Intact Coordination Gross Motor Movements are Fluid and Coordinated: Yes Fine Motor Movements are Fluid and Coordinated: No Finger Nose Finger Test: Improved accuracy with glasses - otherwise WFL Heel Shin Test: Efforful and slowed L vs R Motor  Motor Motor: Within Functional Limits    Trunk/Postural Assessment  Postural Control Postural Control: Within Functional Limits  Balance Balance Balance Assessed: Yes Static Sitting Balance Static Sitting - Balance Support: Feet supported Static Sitting - Level of Assistance: 7: Independent Dynamic Sitting Balance Dynamic Sitting - Balance Support: Feet supported;During functional  activity Dynamic Sitting - Level of Assistance: 6: Modified independent (Device/Increase time) Static Standing Balance Static Standing - Balance Support: Bilateral upper extremity supported Static Standing - Level of Assistance: 6: Modified independent (Device/Increase time) Dynamic Standing Balance Dynamic Standing - Balance Support: Bilateral upper extremity supported Dynamic Standing - Level of Assistance: 6: Modified independent (Device/Increase time) Extremity/Trunk Assessment RUE Assessment RUE Assessment: Within Functional Limits LUE Assessment LUE Assessment: Within Functional Limits   Vernadette Stutsman  05/18/2021, 1:12 PM

## 2021-05-19 ENCOUNTER — Telehealth (HOSPITAL_COMMUNITY): Payer: Self-pay | Admitting: Pharmacist

## 2021-05-19 NOTE — Progress Notes (Signed)
Patient ID: Jordan [name]., male   DOB: [birth year], 75 y.o.   MRN: [id number] Follow up with the patient regarding pending discharge and readiness for going home. Patient feels like he is ready and prepared for discharge. He has handouts for reference should he have questions after discharge about the information given to him. Equipment in the room and [facility] set up per SW. Margarito Liner

## 2021-05-19 NOTE — Telephone Encounter (Signed)
No answer, left VM.

## 2021-05-19 NOTE — Progress Notes (Signed)
PROGRESS NOTE   Subjective/Complaints: Patient seen laying in bed this morning.  He states he slept well overnight.  He is appreciative of his care.  He is ready for discharge.  ROS: Denies CP, SOB, N/V/D  Objective:   No results found. Recent Labs    05/18/21 0635  WBC 6.0  HGB 14.7  HCT 43.7  PLT 247   Recent Labs    05/18/21 0635  NA 138  K 4.2  CL 107  CO2 23  GLUCOSE 91  BUN 16  CREATININE 1.48*  CALCIUM 8.8*    Intake/Output Summary (Last 24 hours) at 05/19/2021 1239 Last data filed at 05/19/2021 0730 Gross per 24 hour  Intake 390 ml  Output 800 ml  Net -410 ml        Physical Exam: Vital Signs Blood pressure (!) 114/99, pulse 67, temperature 98.2 F (36.8 C), temperature source Oral, resp. rate 18, height 6' (1.829 m), weight 95.8 kg, SpO2 100 %.  Constitutional: No distress . Vital signs reviewed. HENT: Normocephalic.  Atraumatic. Eyes: EOMI. No discharge. Cardiovascular: No JVD.  Irregularly irregular. Respiratory: Normal effort.  No stridor.  Bilateral clear to auscultation. GI: Non-distended.  BS +. Skin: Warm and dry.  Intact. Psych: Normal mood.  Normal behavior. Musc: No edema in extremities.  No tenderness in extremities. Neuro: Alert Dysconjugate gaze: Diplopia, improving Motor: RUE/RLE: 5/5 proximal distal LUE/LLE: 4+/5 proximal to distal, improving Bilateral upper extremity dysmetria, stable   Assessment/Plan: 1. Functional deficits which require 3+ hours per day of interdisciplinary therapy in a comprehensive inpatient rehab setting.  Physiatrist is providing close team supervision and 24 hour management of active medical problems listed below.  Physiatrist and rehab team continue to assess barriers to discharge/monitor patient progress toward functional and medical goals  Care Tool:  Bathing    Body parts bathed by patient: Left arm,Right arm,Chest,Abdomen,Front perineal  area,Buttocks,Right upper leg,Left upper leg,Right lower leg,Left lower leg,Face         Bathing assist Assist Level: Supervision/Verbal cueing     Upper Body Dressing/Undressing Upper body dressing   What is the patient wearing?: Pull over shirt    Upper body assist Assist Level: Set up assist    Lower Body Dressing/Undressing Lower body dressing      What is the patient wearing?: Underwear/pull up,Pants     Lower body assist Assist for lower body dressing: Supervision/Verbal cueing     Toileting Toileting    Toileting assist Assist for toileting: Supervision/Verbal cueing Assistive Device Comment: urinal   Transfers Chair/bed transfer  Transfers assist     Chair/bed transfer assist level: Independent with assistive device Chair/bed transfer assistive device: Programmer, multimedia   Ambulation assist      Assist level: Independent with assistive device Assistive device: Walker-rolling Max distance: 150   Walk 10 feet activity   Assist     Assist level: Independent with assistive device Assistive device: Walker-rolling   Walk 50 feet activity   Assist Walk 50 feet with 2 turns activity did not occur: Safety/medical concerns  Assist level: Independent with assistive device Assistive device: Walker-rolling    Walk 150 feet activity  Assist Walk 150 feet activity did not occur: Safety/medical concerns  Assist level: Independent with assistive device Assistive device: Walker-rolling    Walk 10 feet on uneven surface  activity   Assist Walk 10 feet on uneven surfaces activity did not occur: Safety/medical concerns   Assist level: Supervision/Verbal cueing Assistive device: Aeronautical engineer Will patient use wheelchair at discharge?: No Type of Wheelchair: Manual    Wheelchair assist level: Supervision/Verbal cueing Max wheelchair distance: 55    Wheelchair 50 feet with 2 turns  activity    Assist        Assist Level: Supervision/Verbal cueing   Wheelchair 150 feet activity     Assist          Medical Problem List and Plan: 1.  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  DC today  Will see patient for hospital follow-up in 1 month post-discharge  Repeat head CT on 5/9, showing evolution of infarct.  2.  Antithrombotics: -DVT/anticoagulation:  Pharmaceutical: Eliquis             -antiplatelet therapy: Eliquis 3. Pain Management: N/A 4. Mood: LCSW to follow for evaluation and support.              -antipsychotic agents: N/a 5. Neuropsych: This patient is capable of making decisions on  own behalf. 6. Skin/Wound Care: Routine pressure-relief measures. 7. Fluids/Electrolytes/Nutrition: Monitor intake/output. 8.  HTN: Monitor blood pressures twice daily. Continue Losartan and coreg.              Hydralizine 10 TID started on 5/9, increase to 25mg  TID on 5/12, increased to 37.5 on 5/16, decreased to 25 on 5/20   Vitals:   05/19/21 0437 05/19/21 0706  BP: 119/79 (!) 114/99  Pulse: 74 67  Resp:    Temp:    SpO2: 100%    Improving on 7/56, however diastolic continues to be elevated.  Continue to monitor in ambulatory setting with potential further adjustments as necessary 9.  Mild elevation in white count: Resolved  Monitor for fevers and other signs of infection.   10.  CKD IIIa:   Cr.  1.48 on 5/23  Cont to monitor 11.  Gout hx: Continue allopurinol             Monitor for flares 12. Afib rate             Monitor with increased exertion             Eliquis started, continue  Rate controlled on 5/24  > 30 minutes spent in total in discharge planning between myself and PA regarding aforementioned, as well discussion regarding DME equipment, follow-up appointments, follow-up therapies, discharge medications, discharge  recommendations, answering questions.  Please see discharge summary as well.   LOS: 18 days A FACE TO FACE EVALUATION WAS PERFORMED  Jordan [name] 05/19/2021, 12:39 PM

## 2021-05-20 ENCOUNTER — Telehealth (HOSPITAL_COMMUNITY): Payer: Self-pay

## 2021-05-20 NOTE — Telephone Encounter (Signed)
Pt discharged 5/23  New med: apixaban 5 Bid  Spoke at length with pt and pt's sister about apixaban and what is was for, adherence, and adverse effects to watch for. Pt will be starting a pill box- after having verified when to take medications.  Also spoke about working on PT.  No other comments or concerns.

## 2021-05-21 ENCOUNTER — Other Ambulatory Visit (HOSPITAL_COMMUNITY): Payer: Self-pay

## 2021-05-21 NOTE — Discharge Summary (Addendum)
Physician Discharge Summary  Patient ID: Jordan [name]. MRN: [id number] DOB/AGE: [birth year] 75 y.o.  Admit date: 05/01/2021 Discharge date: 05/19/2021  Discharge Diagnoses:  Principal Problem:   Thalamic stroke [facility]) Active Problems:   Atrial fibrillation ([place])   Stage 3a chronic kidney disease ([place])   Benign essential HTN   Discharged Condition: stable   Significant Diagnostic Studies: CT HEAD WO CONTRAST  Result Date: 05/04/2021 CLINICAL DATA:  Stroke, follow-up EXAM: CT HEAD WITHOUT CONTRAST TECHNIQUE: Contiguous axial images were obtained from the base of the skull through the vertex without intravenous contrast. COMPARISON:  Recent CT and MR imaging FINDINGS: Brain: Persistent hypoattenuation involving the medial right thalamus. There is no acute intracranial hemorrhage. No significant mass effect. No new loss of gray-white differentiation. There is no extra-axial fluid collection. Ventricles and sulci are stable in size and configuration. Vascular: No new findings. Skull: Calvarium is unremarkable. Sinuses/Orbits: No acute finding. Other: None. IMPRESSION: Evolving medial right thalamus infarct. No apparent hemorrhage by CT. Electronically Signed   By: Macy Mis M.D.   On: 05/04/2021 08:21       Labs:  Basic Metabolic Panel: BMP Latest Ref Rng & Units 05/18/2021 05/15/2021 05/11/2021  Glucose 70 - 99 mg/dL 91 104(H) 112(H)  BUN 8 - 23 mg/dL 16 17 16   Creatinine 0.61 - 1.24 mg/dL 1.48(H) 1.54(H) 1.42(H)  BUN/Creat Ratio 10 - 24 - - -  Sodium 135 - 145 mmol/L 138 138 137  Potassium 3.5 - 5.1 mmol/L 4.2 4.3 4.1  Chloride 98 - 111 mmol/L 107 110 112(H)  CO2 22 - 32 mmol/L 23 23 19(L)  Calcium 8.9 - 10.3 mg/dL 8.8(L) 9.1 8.7(L)    CBC: CBC Latest Ref Rng & Units 05/18/2021 05/11/2021 05/04/2021  WBC 4.0 - 10.5 K/uL 6.0 6.7 6.9  Hemoglobin 13.0 - 17.0 g/dL 14.7 15.4 15.8  Hematocrit 39.0 - 52.0 % 43.7 45.6 47.2  Platelets 150 - 400 K/uL 247 262 208    CBG: No results  for input(s): GLUCAP in the last 168 hours.  Brief HPI:   Jordan [name]. is a 75 y.o. male with history of HTN, Afib/DVT-on coumadin, non-obst HCM who was admitted on 04/27/21 with reports of weakness and dizziness for a few days.  CTA head was negative for LVO but showed enhancement defects in proximal ICA bilaterally.  MRI/MRA brain showed subacute infarct in ventral medial right thalamus with small amount of petechial hemorrhage.  INR was subtherapeutic at admission at 1.3.  Dr. Erlinda Hong felt the stroke was likely due to small vessel disease versus subtherapeutic INR and recommended starting ASA with repeat CT in 5 to 7 days and then to transition to [place] if stable.  He was also noted to have CKD with serum creatinine at 1.61 and polycythemia with hemoglobin at 1.92 which improved with hydration.  He continued to be limited by diplopia with balance deficits and gait disorder.  CIR was recommended due to functional decline.   Hospital Course: Deeric Cruise. was admitted to rehab 05/01/2021 for inpatient therapies to consist of PT, ST and OT at least three hours five days a week. Past admission physiatrist, therapy team and rehab RN have worked together to provide customized collaborative inpatient rehab.  Follow-up CT head was repeated on 05/09 showing evolving right thalamic infarct and no hemorrhage.  He was started on Eliquis as per neurology recommendation and continues on this for secondary stroke prevention and serial CBC showed that leukocytosis and polycythemia had  resolved.   His blood pressures were monitored on TID basis and were noted to be labile.  Hydralazine was added on 05/9 and has been adjusted during his stay for better BP control.  Follow-up check of BMP shows that electrolytes are within normal limits and serum creatinine is stable.  Heart rate has been controlled. Supervision is recommended for a week to help transition patient to home as he continues to have intermittent  diplopia.    Rehab course: During patient's stay in rehab weekly team conferences were held to monitor patient's progress, set goals and discuss barriers to discharge. At admission, patient required mod to max assist with basic self-care tasks and mod assist with mobility.  He exhibited mild to moderate cognitive impairments with visual deficits likely affecting cognitive status. He  has had improvement in activity tolerance, balance, postural control as well as ability to compensate for deficits.  He requires distant supervision for ADL tasks. He is independent for transfers and is able to ambulate 150' with RW with decreased weight shifting and poor left foot clearance. He requires CGA to climb 4 stairs. He is able to complete complex/higher level cognitive tasks at modified independent level and shows good awareness of deficits/safety. Family education was completed.    Discharge disposition: 01-Home or Self Care  Diet: Heart Healthy.   Special Instructions: 1. No driving or strenuous activity till cleared by MD.  2. Family to provide intermittent supervision for safety.    Discharge Instructions     Ambulatory referral to Physical Medicine Rehab   Complete by: As directed    Moderate complexity follow-up 1 to 2 weeks infarction ventral and medial right thalamus and small petechial hemorrhage      Allergies as of 05/19/2021   No Known Allergies      Medication List     STOP taking these medications    aspirin 81 MG EC tablet       TAKE these medications    acetaminophen 325 MG tablet Commonly known as: TYLENOL Take 1-2 tablets (325-650 mg total) by mouth every 4 (four) hours as needed for mild pain.   allopurinol 100 MG tablet Commonly known as: ZYLOPRIM Take 1 tablet (100 mg total) by mouth daily.   atorvastatin 40 MG tablet Commonly known as: LIPITOR Take 1 tablet (40 mg total) by mouth daily.   carvedilol 25 MG tablet Commonly known as: COREG TAKE 1 TABLET(25  MG) BY MOUTH TWICE DAILY WITH A MEAL What changed: See the new instructions.   diclofenac Sodium 1 % Gel Commonly known as: VOLTAREN Apply 2 g topically 4 (four) times daily as needed (Pain in foot - 1st line).   Eliquis 5 MG Tabs tablet Generic drug: apixaban Take 1 tablet (5 mg total) by mouth 2 (two) times daily.   hydrALAZINE 25 MG tablet Commonly known as: APRESOLINE Take 1 tablet (25 mg total) by mouth every 6 (six) hours.   losartan 100 MG tablet Commonly known as: COZAAR Take 1 tablet (100 mg total) by mouth daily.          Signed: Bary Leriche 05/21/2021, 9:06 AM .Patient was seen, face-face, and physical exam performed by me on day of discharge, greater than 30 minutes of total time spent.. Please see progress note from day of discharge as well.  Delice Lesch, MD, ABPMR

## 2021-05-26 ENCOUNTER — Encounter: Payer: Medicare Other | Attending: Physical Medicine & Rehabilitation | Admitting: Physical Medicine & Rehabilitation

## 2021-06-03 ENCOUNTER — Encounter: Payer: Self-pay | Admitting: Neurology

## 2021-06-03 ENCOUNTER — Ambulatory Visit: Payer: Medicare Other | Admitting: Neurology

## 2021-06-03 ENCOUNTER — Other Ambulatory Visit: Payer: Self-pay

## 2021-06-03 VITALS — BP 142/100 | HR 81 | Ht 72.0 in | Wt 210.0 lb

## 2021-06-03 DIAGNOSIS — I639 Cerebral infarction, unspecified: Secondary | ICD-10-CM | POA: Diagnosis not present

## 2021-06-03 DIAGNOSIS — E785 Hyperlipidemia, unspecified: Secondary | ICD-10-CM

## 2021-06-03 DIAGNOSIS — I10 Essential (primary) hypertension: Secondary | ICD-10-CM

## 2021-06-03 DIAGNOSIS — I4821 Permanent atrial fibrillation: Secondary | ICD-10-CM

## 2021-06-03 DIAGNOSIS — I6381 Other cerebral infarction due to occlusion or stenosis of small artery: Secondary | ICD-10-CM

## 2021-06-03 NOTE — Patient Instructions (Signed)
1.  Continue Eliquis 2.  Continue atorvastatin 3.  Continue blood pressure medication.  If blood pressure still elevated, contact your PCP 4.  Wait for physical therapy 5.  Mediterranean diet (see below) 6.  Follow up 6 months   Mediterranean Diet A Mediterranean diet refers to food and lifestyle choices that are based on the traditions of countries located on the [employer]. This way of eating has been shown to help prevent certain conditions and improve outcomes for people who have chronic diseases, like kidney disease and heart disease. What are tips for following this plan? Lifestyle  Cook and eat meals together with your family, when possible.  Drink enough fluid to keep your urine clear or pale yellow.  Be physically active every day. This includes: ? Aerobic exercise like running or swimming. ? Leisure activities like gardening, walking, or housework.  Get 7-8 hours of sleep each night.  If recommended by your health care provider, drink red wine in moderation. This means 1 glass a day for nonpregnant women and 2 glasses a day for men. A glass of wine equals 5 oz (150 mL). Reading food labels  Check the serving size of packaged foods. For foods such as rice and pasta, the serving size refers to the amount of cooked product, not dry.  Check the total fat in packaged foods. Avoid foods that have saturated fat or trans fats.  Check the ingredients list for added sugars, such as corn syrup.   Shopping  At the grocery store, buy most of your food from the areas near the walls of the store. This includes: ? Fresh fruits and vegetables (produce). ? Grains, beans, nuts, and seeds. Some of these may be available in unpackaged forms or large amounts (in bulk). ? Fresh seafood. ? Poultry and eggs. ? Low-fat dairy products.  Buy whole ingredients instead of prepackaged foods.  Buy fresh fruits and vegetables in-season from local farmers markets.  Buy frozen fruits and  vegetables in resealable bags.  If you do not have access to quality fresh seafood, buy precooked frozen shrimp or canned fish, such as tuna, salmon, or sardines.  Buy small amounts of raw or cooked vegetables, salads, or olives from the deli or salad bar at your store.  Stock your pantry so you always have certain foods on hand, such as olive oil, canned tuna, canned tomatoes, rice, pasta, and beans. Cooking  Cook foods with extra-virgin olive oil instead of using butter or other vegetable oils.  Have meat as a side dish, and have vegetables or grains as your main dish. This means having meat in small portions or adding small amounts of meat to foods like pasta or stew.  Use beans or vegetables instead of meat in common dishes like chili or lasagna.  Experiment with different cooking methods. Try roasting or broiling vegetables instead of steaming or sauteing them.  Add frozen vegetables to soups, stews, pasta, or rice.  Add nuts or seeds for added healthy fat at each meal. You can add these to yogurt, salads, or vegetable dishes.  Marinate fish or vegetables using olive oil, lemon juice, garlic, and fresh herbs. Meal planning  Plan to eat 1 vegetarian meal one day each week. Try to work up to 2 vegetarian meals, if possible.  Eat seafood 2 or more times a week.  Have healthy snacks readily available, such as: ? Vegetable sticks with hummus. ? [place] yogurt. ? Fruit and nut trail mix.  Eat balanced meals throughout  the week. This includes: ? Fruit: 2-3 servings a day ? Vegetables: 4-5 servings a day ? Low-fat dairy: 2 servings a day ? Fish, poultry, or lean meat: 1 serving a day ? Beans and legumes: 2 or more servings a week ? Nuts and seeds: 1-2 servings a day ? Whole grains: 6-8 servings a day ? Extra-virgin olive oil: 3-4 servings a day  Limit red meat and sweets to only a few servings a month   What are my food choices?  Mediterranean diet ? Recommended  Grains:  Whole-grain pasta. Brown rice. Bulgar wheat. Polenta. Couscous. Whole-wheat bread. [place].  Vegetables: Artichokes. Beets. Broccoli. Cabbage. Carrots. Eggplant. Green beans. Chard. Kale. Spinach. Onions. Leeks. Peas. Squash. Tomatoes. Peppers. Radishes.  Fruits: Apples. Apricots. Avocado. Berries. Bananas. Cherries. Dates. Figs. Grapes. Lemons. Melon. Oranges. Peaches. Plums. Pomegranate.  Meats and other protein foods: Beans. Almonds. Sunflower seeds. Pine nuts. Peanuts. [place]. Salmon. Scall[place]. Shrimp. [place]. Tilapia. Clams. Oysters. Eggs.  Dairy: Low-fat milk. Cheese. Greek yogurt.  Beverages: Water. Red wine. Herbal tea.  Fats and oils: Extra virgin olive oil. Avocado oil. Grape seed oil.  Sweets and desserts: [place] yogurt with honey. Baked apples. Poached pears. Trail mix.  Seasoning and other foods: Basil. Cilantro. Coriander. Cumin. Mint. Parsley. Sage. Rosemary. Tarragon. Garlic. Oregano. Thyme. Pepper. Balsalmic vinegar. Tahini. Hummus. Tomato sauce. Olives. Mushrooms. ? Limit these  Grains: Prepackaged pasta or rice dishes. Prepackaged cereal with added sugar.  Vegetables: Deep fried potatoes (french fries).  Fruits: Fruit canned in syrup.  Meats and other protein foods: Beef. Pork. Lamb. Poultry with skin. Hot dogs. [place] Salines.  Dairy: Ice cream. Sour cream. Whole milk.  Beverages: Juice. Sugar-sweetened soft drinks. Beer. Liquor and spirits.  Fats and oils: Butter. Canola oil. Vegetable oil. Beef fat (tallow). Lard.  Sweets and desserts: Cookies. Cakes. Pies. Candy.  Seasoning and other foods: Mayonnaise. Premade sauces and marinades. The items listed may not be a complete list. Talk with your dietitian about what dietary choices are right for you. Summary  The Mediterranean diet includes both food and lifestyle choices.  Eat a variety of fresh fruits and vegetables, beans, nuts, seeds, and whole grains.  Limit the amount of red meat and sweets that you  eat.  Talk with your health care provider about whether it is safe for you to drink red wine in moderation. This means 1 glass a day for nonpregnant women and 2 glasses a day for men. A glass of wine equals 5 oz (150 mL). This information is not intended to replace advice given to you by your health care provider. Make sure you discuss any questions you have with your health care provider. Document Revised: 08/12/2016 Document Reviewed: 08/05/2016 Elsevier Patient Education  [place].

## 2021-06-03 NOTE — Progress Notes (Signed)
NEUROLOGY CONSULTATION NOTE  Jordan [name]. MRN: [id number] DOB: 1946/08/18  Referring provider: [name], MD Primary care provider: [name], MD  Reason for consult:  stroke  Assessment/Plan:   1.  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  Chronic atrial fibrillation 3.  Hypertension 4.  Hyperlipidemia  1.  Secondary stroke prevention as managed by PCP and cardiology: - Eliquis - atorvastatin 40mg .  LDL goal less than 70 - Normotensive blood pressure 2.  Mediterranean diet 3.  Advised to contact PCP's office to follow up on physical therapy 4.  Follow up with me in 6 months.    Subjective:  Jordan [name] is a 75 year old left-handed man with atrial fibrillation, HTN, and history of substance abuse and prostate cancer who presents for right thalamic stroke.  He is accompanied by his wife who supplements history.    Presented to [facility] on 04/27/2021 with generalized weakness, gait difficulty, confusion and intermittent double vision.  No tPA given as patient was outside therapeutic window.  Current medications:  Eliquis, atorvastatin 40mg , losartan, Coreg, hydralazine  CT head showed acute right thalamic infarct which was confirmed on MRI of brain with petechial hemorrhage.  CTA of head and neck showed right ICA siphon atherosclerosis but no large vessel occlusion or hemodynamically significant stenosis.  Carotid doppler showed no hemodynamically significant stenosis.  Echcocardiogram showed a fib with EF 45-50% akinetic basal inferior segment and hypokinetic mid inferoseptal segment, mid inferior segment and basal inferoseptal segment and no IAS/PFO.  INR was found to be low at 1.5 despite taking warfarin.  LDL 103, Hgb A1c 5.6 and UDS negative.  He was on warfarin prior to admission.  Discharged on ASA 81mg  daily without anticoagulation (given  hemorrhagic transformation) with plan to repeat CT head in 5-7 days.  Repeat CT head on 05/04/2021 was stable with no hemorrhage.  Started Eliquis (as it is easier to manage than warfarin) and discontinued ASA.  He was in rehab and now at home.  His PCP ordered home physical therapy but he has not received a call about scheduling.  He still reports intermittent double vision.  Legs still feel weak.  He has only been ambulating with the walker.     PAST MEDICAL HISTORY: Past Medical History:  Diagnosis Date  . Atrial fib/flutter, transient    Hx  . Atrial fibrillation (HCC)    coumadin  . Colon cancer [facility])    family hx  . Diverticulosis   . Gout   . Hemorrhoids   . Hepatitis C   . History of DVT (deep vein thrombosis)   . History of nuclear stress test 04/2002   exercise; normal study   . Hypertension   . 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 Personal history of colonic polyps   . Prostate cancer [facility])    radical prostatectomy     PAST SURGICAL HISTORY: Past Surgical History:  Procedure Laterality Date  . CARDIAC CATHETERIZATION  2007   no occlusive CAD  . COLONOSCOPY  7829,5621   [name]  . PROSTATECTOMY    . PROSTATECTOMY  04-04   Dr. Risa Grill  . TRANSTHORACIC ECHOCARDIOGRAM  05/2006   EF ~30%; LV systolic function normal; mild focal basal septal hypertrophy; AV thickness mildly increased; LA mod-markedly dilate    MEDICATIONS: Current Outpatient Medications on File Prior to Visit  Medication Sig Dispense  Refill  . acetaminophen (TYLENOL) 325 MG tablet Take 1-2 tablets (325-650 mg total) by mouth every 4 (four) hours as needed for mild pain.    Marland Kitchen allopurinol (ZYLOPRIM) 100 MG tablet Take 1 tablet (100 mg total) by mouth daily. 30 tablet 0  . apixaban (ELIQUIS) 5 MG TABS tablet Take 1 tablet (5 mg total) by mouth 2 (two) times daily. 60 tablet 0  . atorvastatin (LIPITOR) 40 MG tablet Take 1 tablet (40 mg total) by mouth daily. 30 tablet 0   . carvedilol (COREG) 25 MG tablet TAKE 1 TABLET(25 MG) BY MOUTH TWICE DAILY WITH A MEAL 60 tablet 0  . diclofenac Sodium (VOLTAREN) 1 % GEL Apply 2 g topically 4 (four) times daily as needed (Pain in foot - 1st line). 200 g 0  . hydrALAZINE (APRESOLINE) 25 MG tablet Take 1 tablet (25 mg total) by mouth every 6 (six) hours. 180 tablet 0  . losartan (COZAAR) 100 MG tablet Take 1 tablet (100 mg total) by mouth daily. 30 tablet 0   No current facility-administered medications on file prior to visit.    ALLERGIES: No Known Allergies  FAMILY HISTORY: Family History  Problem Relation Age of Onset  . Brain cancer Mother        brain tumor  . Colon cancer Mother   . Stroke Father   . Rectal cancer Neg Hx   . Stomach cancer Neg Hx   . Esophageal cancer Neg Hx     Objective:  Blood pressure (!) 142/100, pulse 81, height 6' (1.829 m), weight 210 lb (95.3 kg), SpO2 98 %. General: No acute distress.  Patient appears well-groomed.   Head:  Normocephalic/atraumatic Eyes:  fundi examined but not visualized Neck: supple, no paraspinal tenderness, full range of motion Back: No paraspinal tenderness Heart: regular rate and rhythm Lungs: Clear to auscultation bilaterally. Vascular: No carotid bruits. Neurological Exam: Mental status: alert and oriented to person, place, and time, recent and remote memory intact, fund of knowledge intact, attention and concentration intact, speech fluent and not dysarthric, language intact. Cranial nerves: CN I: not tested CN II: pupils equal, round and reactive to light, visual fields intact CN III, IV, VI:  full range of motion, no nystagmus, no ptosis CN V: facial sensation intact. CN VII: upper and lower face symmetric CN VIII: hearing intact CN IX, X: gag intact, uvula midline CN XI: sternocleidomastoid and trapezius muscles intact CN XII: tongue midline Bulk & Tone: normal, no fasciculations. Motor:  muscle strength 5/5 throughout Sensation:   Pinprick sensation intact; vibratory sensation mildly reduced in toes. Deep Tendon Reflexes:  2+ throughout,  toes downgoing.   Finger to nose testing:  Without dysmetria.   Heel to shin:  Without dysmetria.   Gait:  Wide-based mildly unsteady gait.  Romberg negative.    Thank you for allowing me to take part in the care of this patient.  Metta Clines, DO  CC:  [name], MD

## 2021-06-24 ENCOUNTER — Telehealth: Payer: Self-pay | Admitting: Internal Medicine

## 2021-06-24 NOTE — Telephone Encounter (Signed)
Pt wife called stating today during PT pt BP was 180/130. She state pt denies headache or blurred vision, but report pain in his left hand. She state EMS was contacted but pt refused to go to the hospital. Wife report pt did just have a gout flare up that was located in left elbow and right shoulder and just completed a course of prednisone.   Wife also report pcp was contacted and recommendations were to take tylenol and contact cardiologist for hospital follow up. Appointment scheduled for 7/11 with Laurann Montana, NP  06/16/21: 130/80 06/17/21: 112/64 06/18/21: 140/80 06/19/21: 128/78 06/24/21: 180/130  Will forward to MD to make aware.

## 2021-06-24 NOTE — Telephone Encounter (Signed)
Pt c/o BP issue: STAT if pt c/o blurred vision, one-sided weakness or slurred speech  1. What are your last 5 BP readings?  06/16/21: 130/80 06/17/21: 112/64 06/18/21: 140/80 06/19/21: 128/78 06/24/21: 180/130  2. Are you having any other symptoms (ex. Dizziness, headache, blurred vision, passed out)? SOB  3. What is your BP issue? Sister is worried about BP being high  Pt c/o Shortness Of Breath: STAT if SOB developed within the last 24 hours or pt is noticeably SOB on the phone  1. Are you currently SOB (can you hear that pt is SOB on the phone)? A little bit   2. How long have you been experiencing SOB?   3. Are you SOB when sitting or when up moving around? With exertion   4. Are you currently experiencing any other symptoms? Elevated BP

## 2021-07-06 ENCOUNTER — Other Ambulatory Visit: Payer: Self-pay

## 2021-07-06 ENCOUNTER — Encounter (HOSPITAL_BASED_OUTPATIENT_CLINIC_OR_DEPARTMENT_OTHER): Payer: Self-pay | Admitting: Family

## 2021-07-06 ENCOUNTER — Ambulatory Visit (INDEPENDENT_AMBULATORY_CARE_PROVIDER_SITE_OTHER): Payer: Medicare Other | Admitting: Family

## 2021-07-06 VITALS — BP 144/68 | HR 66 | Ht 72.0 in | Wt 208.0 lb

## 2021-07-06 DIAGNOSIS — I10 Essential (primary) hypertension: Secondary | ICD-10-CM

## 2021-07-06 DIAGNOSIS — Z8673 Personal history of transient ischemic attack (TIA), and cerebral infarction without residual deficits: Secondary | ICD-10-CM

## 2021-07-06 DIAGNOSIS — I4819 Other persistent atrial fibrillation: Secondary | ICD-10-CM

## 2021-07-06 DIAGNOSIS — Z7901 Long term (current) use of anticoagulants: Secondary | ICD-10-CM

## 2021-07-06 DIAGNOSIS — I5022 Chronic systolic (congestive) heart failure: Secondary | ICD-10-CM

## 2021-07-06 DIAGNOSIS — I428 Other cardiomyopathies: Secondary | ICD-10-CM

## 2021-07-06 MED ORDER — HYDRALAZINE HCL 50 MG PO TABS: 50.0000 mg | ORAL_TABLET | Freq: Three times a day (TID) | ORAL | 1 refills | Status: DC

## 2021-07-06 NOTE — Patient Instructions (Addendum)
Medication Instructions:  Your physician has recommended you make the following change in your medication:   CHANGE Hydralazine to 50mg  one tablet three time per day   *If you need a refill on your cardiac medications before your next appointment, please call your pharmacy*   Lab Work: None ordered today   Testing/Procedures: None ordered today.  Your echocardiogram in the hospital showed your heart pumping function was a little lower than normal. We strengthen this with medications such as Losartan, Hydralazine, and Carvedilol.   Follow-Up: At [facility], you and your health needs are our priority.  As part of our continuing mission to provide you with exceptional heart care, we have created designated Provider Care Teams.  These Care Teams include your primary Cardiologist (physician) and Advanced Practice Providers (APPs -  Physician Assistants and Nurse Practitioners) who all work together to provide you with the care you need, when you need it.  We recommend signing up for the patient portal called "MyChart".  Sign up information is provided on this After Visit Summary.  MyChart is used to connect with patients for Virtual Visits (Telemedicine).  Patients are able to view lab/test results, encounter notes, upcoming appointments, etc.  Non-urgent messages can be sent to your provider as well.   To learn more about what you can do with MyChart, go to [email or web address].    Your next appointment:   3 month(s) with Almyra Deforest PA-C on Wednesday, October 26 @ 2:45 pm.   The format for your next appointment:   In Person  Provider:   You may see Pixie Casino, MD or one of the following Advanced Practice Providers on your designated Care Team:   Almyra Deforest, PA-C Fabian Sharp, [place] or  Roby Lofts, PA-C   Other Instructions  Bring your blood pressure is routinely more than 130/80 please call our office.  Your MyChart Username is: [id number] Your password is :  Welcome  Heart Healthy Diet Recommendations: A low-salt diet is recommended. Meats should be grilled, baked, or boiled. Avoid fried foods. Focus on lean protein sources like fish or chicken with vegetables and fruits. The American Heart Association is a Microbiologist!  American Heart Association Diet and Lifeystyle Recommendations    Exercise recommendations: The American Heart Association recommends 150 minutes of moderate intensity exercise weekly. Try 30 minutes of moderate intensity exercise 4-5 times per week. This could include walking, jogging, or swimming.  Heart Failure, Diagnosis  Heart failure means that your heart is not able to pump blood in the right way. This makes it hard for your body to work well. Heart failure is usually a long-term (chronic) condition. You must take good care of yourself and follow your treatment planfrom your doctor. What are the causes? High blood pressure. Buildup of cholesterol and fat in the arteries. Heart attack. This injures the heart muscle. Heart valves that do not open and close properly. Damage of the heart muscle. This is also called cardiomyopathy. Infection of the heart muscle. This is also called myocarditis. Lung disease. What increases the risk? Getting older. The risk of heart failure goes up as a person ages. Being overweight. Being male. Use tobacco or nicotine products. Abusing alcohol or drugs. Having taken medicines that can damage the heart. Having any of these conditions: Diabetes. Abnormal heart rhythms. Thyroid problems. Low blood counts (anemia). Having a family history of heart failure. What are the signs or symptoms? Shortness of breath. Coughing. Swelling of the feet, ankles, legs, or belly.  Losing or gaining weight for no reason. Trouble breathing. Waking from sleep because of the need to sit up and get more air. Fast heartbeat. Being very tired. Feeling dizzy, or feeling like you may pass out  (faint). Having no desire to eat. Feeling like you may vomit (nauseous). Peeing (urinating) more at night. Feeling confused. How is this treated? This condition may be treated with: Medicines. These can be given to treat blood pressure and to make the heart muscles stronger. Changes in your daily life. These may include: Eating a healthy diet. Staying at a healthy body weight. Quitting tobacco, alcohol, and drug use. Doing exercises. Participating in a cardiac rehabilitation program. This program helps you improve your health through exercise, education, and counseling. Surgery. Surgery can be done to open blocked valves, or to put devices in the heart, such as pacemakers. A donor heart (heart transplant). You will receive a healthy heart from a donor. Follow these instructions at home: Treat other conditions as told by your doctor. These may include high blood pressure, diabetes, thyroid disease, or abnormal heart rhythms. Learn as much as you can about heart failure. Get support as you need it. Keep all follow-up visits. Summary Heart failure means that your heart is not able to pump blood in the right way. This condition is often caused by high blood pressure, heart attack, or damage of the heart muscle. Symptoms of this condition include shortness of breath and swelling of the feet, ankles, legs, or belly. You may also feel very tired or feel like you may vomit. You may be treated with medicines, surgery, or changes in your daily life. Treat other health conditions as told by your doctor. This information is not intended to replace advice given to you by your health care provider. Make sure you discuss any questions you have with your healthcare provider. Document Revised: 07/05/2020 Document Reviewed: 07/05/2020 Elsevier Patient Education  [place].

## 2021-07-06 NOTE — Progress Notes (Signed)
Office Visit    Patient Name: Jordan [name]. Date of Encounter: 07/06/2021 Option to take and stay very predominantly get PCP:  [name], [place]  Cardiologist:  Pixie Casino, MD  Advanced Practice Provider:  No care team member to display Electrophysiologist:  None    Chief Complaint    Jordan Provence. is a 75 y.o. male with a hx of paroxysmal atrial fibrillation, DVT, HFrEF, hypertension, CVA presents today for hospital follow up.   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 Risa Grill   TRANSTHORACIC ECHOCARDIOGRAM  05/2006   EF ~93%; LV systolic function normal; mild focal basal septal hypertrophy; AV thickness mildly increased; LA mod-markedly dilate    Allergies  No Known Allergies  History of Present Illness    Jordan [name]. is a 75 y.o. male with a hx of  paroxysmal atrial fibrillation, DVT, HFrEF, hypertension, CVA last seen 10/2020 by Dr. Debara Pickett.  He has previously followed with Dr. Debara Pickett. Most recently evaluated in clinic 11/24/20 doing overall well from cardiac perspective. He had some gout with hydrochlorothiazide and was started on allopurinol.   He was admitted 04/27/21 MRI/MRA brain showing subacute infarct in ventral medial  right thalamus with small amount of petechial hemorrhage. Echo 5/3/2022LVEF 45 to 50%, wall motion abnormalities, indeterminate diastolic parameters, RV normal function, RV mildly enlarged, bilateral atria severely dilated, mild AI, mild to moderate aortic valve sclerosis without stenosis, borderline dilation of ascending aorta 39 mm.  Carotid duplex 04/28/21 with no carotid artery stenosis. Dr. Erlinda Hong of neurology felt stroke likely due to small vessel disease. BP was labile and Hydralazine added. Warfarin transitioned to Eliquis at discharge.He was discharged to CIR.   He presents today for follow-up with his sister and brother-in-law.  Reports pain, pressure, tightness.  Tells me has been difficulty with blood pressure reading at home due to his atrial fibrillation as the cuffs routinely read inaccurate.  Does note blood pressures when checked by home health have been in the 140s over 60s. Tells me he gets tired when he is eating. Notes this makes him feel more short of breath.  This is overall improving since hospital discharge.  EKGs/Labs/Other Studies Reviewed:   The following studies were reviewed today:  Echo 04/28/21  1. Atrial fibrillation present. Left ventricular ejection fraction, by  estimation, is 45 to 50%. The left ventricle has mildly decreased  function. The left ventricle demonstrates regional wall motion  abnormalities (see scoring diagram/findings for  description). Left ventricular diastolic parameters are indeterminate.   2. Right ventricular systolic function is  normal. The right ventricular  size is mildly enlarged. There is normal pulmonary artery systolic  pressure.   3. Left atrial size was severely dilated.   4. Right atrial size was severely dilated.   5. The mitral valve is normal in structure. No evidence of mitral valve  regurgitation. No evidence of mitral stenosis.   6. The aortic valve is tricuspid. Aortic valve regurgitation is mild.  Mild to moderate aortic valve  sclerosis/calcification is present, without  any evidence of aortic stenosis.   7. There is borderline dilatation of the ascending aorta, measuring 39  mm.   8. The inferior vena cava is normal in size with greater than 50%  respiratory variability, suggesting right atrial pressure of 3 mmHg.   EKG:  No EKG today.  Recent Labs: 04/27/2021: B Natriuretic Peptide 446.7; Magnesium 2.0; TSH 2.994 05/02/2021: ALT 27 05/18/2021: BUN 16; Creatinine, Ser 1.48; Hemoglobin 14.7; Platelets 247; Potassium 4.2; Sodium 138  Recent Lipid Panel    Component Value Date/Time   CHOL 150 04/28/2021 0446   CHOL 157 05/03/2018 1017   TRIG 51 04/28/2021 0446   HDL 37 (L) 04/28/2021 0446   HDL 55 05/03/2018 1017   CHOLHDL 4.1 04/28/2021 0446   VLDL 10 04/28/2021 0446   LDLCALC 103 (H) 04/28/2021 0446   LDLCALC 88 05/03/2018 1017    Home Medications   Current Meds  Medication Sig   acetaminophen (TYLENOL) 325 MG tablet Take 1-2 tablets (325-650 mg total) by mouth every 4 (four) hours as needed for mild pain.   allopurinol (ZYLOPRIM) 100 MG tablet Take 1 tablet (100 mg total) by mouth daily.   apixaban (ELIQUIS) 5 MG TABS tablet Take 1 tablet (5 mg total) by mouth 2 (two) times daily.   atorvastatin (LIPITOR) 40 MG tablet Take 1 tablet (40 mg total) by mouth daily.   carvedilol (COREG) 25 MG tablet TAKE 1 TABLET(25 MG) BY MOUTH TWICE DAILY WITH A MEAL   diclofenac Sodium (VOLTAREN) 1 % GEL Apply 2 g topically 4 (four) times daily as needed (Pain in foot - 1st line).   hydrALAZINE (APRESOLINE) 25 MG tablet Take 1 tablet (25 mg total) by mouth every 6 (six) hours.   losartan (COZAAR) 100 MG tablet Take 1 tablet (100 mg total) by mouth daily.     Review of Systems      All other systems reviewed and are otherwise negative except as noted above.  Physical Exam    VS:  BP (!) 144/68   Pulse 66   Ht 6' (1.829 m)   Wt 208 lb (94.3 kg)   SpO2 98%   BMI 28.21 kg/m  , BMI Body mass index is 28.21  kg/m.  Wt Readings from Last 3 Encounters:  07/06/21 208 lb (94.3 kg)  06/03/21 210 lb (95.3 kg)  05/01/21 211 lb 3.2 oz (95.8 kg)     GEN: Well nourished, well developed, in no acute distress. HEENT: normal. Neck: Supple, no JVD, carotid bruits, or masses. Cardiac: irregularly irregular, no murmurs, rubs, or gallops. No clubbing, cyanosis, edema.  Radials/PT 2+ and equal bilaterally.  Respiratory:  Respirations regular and unlabored, clear to auscultation bilaterally. GI: Soft, nontender, nondistended. MS: No deformity or atrophy. Skin: Warm and dry, no rash. Neuro:  Strength and sensation are intact. Psych: Normal affect.  Assessment & Plan    Persistent atrial fibrillation / Chronic anticoagulation - 05/18/21 Hb 14.7.  Continue Eliquis 5 mg twice daily due to CHA2DS2-VASc of at least 5 (  age, HTN, NICM, CVAx2).  Denies bleeding, occasions.  Rate controlled today with carvedilol 25 mg twice daily, continue present dose.  NICM / HFrEF - Echo 04/28/21 LVEF 45-50%, indeterminite diastolic parameters.  GDMT includes losartan, carvedilol, hydralazine.  No indication for loop diuretic at this time.  Consider repeat echocardiogram in 6 to 12 months.  Euvolemic and well compensated on exam.  No negation for loop diuretic at this time.  Low-sodium diet and fluid restriction less than 2 L encouraged.Heart healthy diet and regular cardiovascular exercise encouraged.   HTN -blood pressure elevated  today.  Continue losartan 100 mg daily, carvedilol 25 mg twice daily.  Increase hydralazine to 50 mg 3 times daily. Encouraged to report blood pressure consistently greater than 130/80.  Hx of CVA -continue to follow with neurology.  Continue to follow at home with PT and ST.  Continue optimal blood pressure, lipid control.  Continue statin.  No aspirin due to chronic anticoagulation secondary to paroxysmal atrial fibrillation.  Borderline dilation of ascending aorta by echo 04/28/2021 measuring 39 mm  -continue optimal BP and volume control.  Consider repeat echocardiogram in 1 year.  Disposition: Follow up in 3 month(s) with Dr. Debara Pickett or APP.  Signed, Loel Dubonnet, NP 07/06/2021, 11:17 AM [place]

## 2021-07-17 ENCOUNTER — Telehealth (HOSPITAL_BASED_OUTPATIENT_CLINIC_OR_DEPARTMENT_OTHER): Payer: Self-pay | Admitting: Family

## 2021-07-17 DIAGNOSIS — B1920 Unspecified viral hepatitis C without hepatic coma: Secondary | ICD-10-CM

## 2021-07-17 NOTE — Telephone Encounter (Signed)
Spoke with patient. Verbalized understanding of hepatitis C diagnosis and need for treatment. Agreeable for referral to [facility]. Referral placed.   Loel Dubonnet, NP

## 2021-07-17 NOTE — Telephone Encounter (Signed)
Received following note from inpatient pharmacy team:  "This guy has been in my reminder folder for a little while because he was inpt for his acute CVA. I noticed that he was positive for hep C a while but I didn't think that he was ever treated so we repeated labs. Surely, he was not. I reached out to Rehab to refer him to the ID clinic for treatment but he never was. I know that he sees someone over at [facility] for his primary care but I'm not sure of a way to reach them through Epic. Therefore, I'm just reaching to see if you know of a way to get them or someone just to refer him to the [facility] to begin the process of treating his hep C. I know that it's a strange request."  Called patient cell - no answer. Called sister per DPR - she verbalized understanding but requested I discuss with Mr. Jordan [name]. He is currently working with PT but would be available in 1 hour.   I will call him later this afternoon.   Loel Dubonnet, NP

## 2021-07-17 NOTE — Addendum Note (Signed)
Addended by: Loel Dubonnet on: 07/17/2021 03:31 PM   Modules accepted: Orders

## 2021-08-04 ENCOUNTER — Ambulatory Visit (INDEPENDENT_AMBULATORY_CARE_PROVIDER_SITE_OTHER): Payer: Medicare Other | Admitting: Internal Medicine

## 2021-08-04 ENCOUNTER — Other Ambulatory Visit: Payer: Self-pay

## 2021-08-04 DIAGNOSIS — B182 Chronic viral hepatitis C: Secondary | ICD-10-CM

## 2021-08-04 NOTE — Progress Notes (Signed)
[facility] for Infectious Disease  Reason for Consult: Chronic hepatitis C Referring Provider: Dr. [name]  Assessment: He has chronic hepatitis C, subtype 1B.  Although he had a recent CVA he is recuperating well and he is very interested in treatment for his hepatitis C.  I will obtain a fibrin test today and see him back in 1 to 2-week to consider treatment with [place].   Plan: CBC, CMP, repeat hepatitis C viral load and fibrin test today Follow-up in 1 to 2 weeks  Patient Active Problem List   Diagnosis Date Noted   Chronic hepatitis C without hepatic coma (HCC)     Priority: High   Stage 3a chronic kidney disease ([place])    History of gout    Substance abuse ([place])    Right thalamic infarction ([place]) 04/27/2021   Personal history of colonic polyps 01/16/2019   Family history of colon cancer in mother 01/16/2019   Screening for lipid disorders 05/03/2018   NICM (nonischemic cardiomyopathy) ([place]) [id number]   Chronic systolic heart failure ([place]) 02/15/2017   Essential hypertension 01/07/2017   Persistent atrial fibrillation ([place]) 03/15/2013   Long term (current) use of anticoagulants 03/15/2013   History of prostate cancer 02/16/2013    Patient's Medications  New Prescriptions   No medications on file  Previous Medications   ACETAMINOPHEN (TYLENOL) 325 MG TABLET    Take 1-2 tablets (325-650 mg total) by mouth every 4 (four) hours as needed for mild pain.   ALLOPURINOL (ZYLOPRIM) 100 MG TABLET    Take 1 tablet (100 mg total) by mouth daily.   APIXABAN (ELIQUIS) 5 MG TABS TABLET    Take 1 tablet (5 mg total) by mouth 2 (two) times daily.   ATORVASTATIN (LIPITOR) 40 MG TABLET    Take 1 tablet (40 mg total) by mouth daily.   CARVEDILOL (COREG) 25 MG TABLET    TAKE 1 TABLET(25 MG) BY MOUTH TWICE DAILY WITH A MEAL   DICLOFENAC SODIUM (VOLTAREN) 1 % GEL    Apply 2 g topically 4 (four) times daily as needed (Pain in foot - 1st line).   HYDRALAZINE  (APRESOLINE) 50 MG TABLET    Take 1 tablet (50 mg total) by mouth 3 (three) times daily.   LOSARTAN (COZAAR) 100 MG TABLET    Take 1 tablet (100 mg total) by mouth daily.  Modified Medications   No medications on file  Discontinued Medications   No medications on file    HPI: Jordan [name]. is a 75 y.o. male who was diagnosed with active hepatitis C in 2016.  Unfortunately he did not follow-up as planned and was never completely evaluated or treated.  He was hospitalized in May of this year with an acute CVA.  On admission he had slight elevation of his transaminases which rapidly returned to normal.  His hepatitis C viral load was obtained and was 81,500.  His genotype was performed and he had hepatitis C subtype 1B.  He believes that his risk factor was drug use many years ago.  He has never had a blood transfusion. He has never had any liver problems that he is aware of.  He says that he drinks alcohol infrequently and not to excess.  He is very much interested in treatment for his hepatitis C.  He says that he still has some imbalance related to his recent stroke but he is making progress with physical therapy.  Review of Systems:  Review of Systems  Constitutional:  Negative for fever and weight loss.  Gastrointestinal:  Negative for abdominal pain, constipation, diarrhea, heartburn, nausea and vomiting.  Neurological:  Negative for focal weakness.       As noted in HPI.     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[place])    history of    Personal history of colonic polyps    Prostate cancer ([place])    radical prostatectomy     Social  History   Tobacco Use   Smoking status: Never   Smokeless tobacco: Never  Vaping Use   Vaping Use: Never used  Substance Use Topics   Alcohol use: Yes    Alcohol/week: 3.0 standard drinks    Types: 3 Glasses of wine per week    Comment: occasional    Drug use: No    Family History  Problem Relation Age of Onset   Brain cancer Mother        brain tumor   Colon cancer Mother    Stroke Father    Rectal cancer Neg Hx    Stomach cancer Neg Hx    Esophageal cancer Neg Hx    No Known Allergies  OBJECTIVE: Vitals:   08/04/21 1505  Pulse: (!) 50  Temp: 97.9 F (36.6 C)  TempSrc: Oral  SpO2: 99%  Weight: 208 lb (94.3 kg)   Body mass index is 28.21 kg/m.   Physical Exam Constitutional:      Comments: He is in good spirits.  He is accompanied by his sister and brother-in-law.  Cardiovascular:     Rate and Rhythm: Normal rate. Rhythm irregular.     Heart sounds: No murmur heard. Pulmonary:     Effort: Pulmonary effort is normal.     Breath sounds: Normal breath sounds.  Abdominal:     Palpations: Abdomen is soft. There is no mass.     Tenderness: There is no abdominal tenderness.  Neurological:     Comments: He walks with the aid of a rolling walker.  Psychiatric:        Mood and Affect: Mood normal.    Microbiology: No results found for this or any previous visit (from the past 240 hour(s)).  Michel Bickers, MD [facility] for Infectious [facility] [phone] pager   [phone] cell 08/04/2021, 3:35 PM

## 2021-08-04 NOTE — Assessment & Plan Note (Signed)
He has chronic hepatitis C, subtype 1B.  Although he had a recent CVA he is recuperating well and he is very interested in treatment for his hepatitis C.  I will obtain a fibrin test today and see him back in 1 to 2-week to consider treatment with [place].

## 2021-08-08 LAB — COMPREHENSIVE METABOLIC PANEL
AG Ratio: 1.1 (calc) (ref 1.0–2.5)
ALT: 57 U/L — ABNORMAL HIGH (ref 9–46)
AST: 49 U/L — ABNORMAL HIGH (ref 10–35)
Albumin: 3.6 g/dL (ref 3.6–5.1)
Alkaline phosphatase (APISO): 86 U/L (ref 35–144)
BUN/Creatinine Ratio: 12 (calc) (ref 6–22)
BUN: 17 mg/dL (ref 7–25)
CO2: 21 mmol/L (ref 20–32)
Calcium: 9.4 mg/dL (ref 8.6–10.3)
Chloride: 111 mmol/L — ABNORMAL HIGH (ref 98–110)
Creat: 1.42 mg/dL — ABNORMAL HIGH (ref 0.70–1.28)
Globulin: 3.3 g/dL (calc) (ref 1.9–3.7)
Glucose, Bld: 99 mg/dL (ref 65–99)
Potassium: 4 mmol/L (ref 3.5–5.3)
Sodium: 141 mmol/L (ref 135–146)
Total Bilirubin: 2.1 mg/dL — ABNORMAL HIGH (ref 0.2–1.2)
Total Protein: 6.9 g/dL (ref 6.1–8.1)

## 2021-08-08 LAB — CBC
HCT: 49.4 % (ref 38.5–50.0)
Hemoglobin: 16.2 g/dL (ref 13.2–17.1)
MCH: 28.8 pg (ref 27.0–33.0)
MCHC: 32.8 g/dL (ref 32.0–36.0)
MCV: 87.9 fL (ref 80.0–100.0)
MPV: 11.4 fL (ref 7.5–12.5)
Platelets: 158 10*3/uL (ref 140–400)
RBC: 5.62 10*6/uL (ref 4.20–5.80)
RDW: 14.2 % (ref 11.0–15.0)
WBC: 4.8 10*3/uL (ref 3.8–10.8)

## 2021-08-08 LAB — LIVER FIBROSIS, FIBROTEST-ACTITEST
ALT: 57 U/L — ABNORMAL HIGH (ref 9–46)
Alpha-2-Macroglobulin: 372 mg/dL — ABNORMAL HIGH (ref 106–279)
Apolipoprotein A1: 136 mg/dL (ref 94–176)
Bilirubin: 1.9 mg/dL — ABNORMAL HIGH (ref 0.2–1.2)
Fibrosis Score: 0.91
GGT: 154 U/L — ABNORMAL HIGH (ref 3–70)
Haptoglobin: 164 mg/dL (ref 43–212)
Necroinflammat ACT Score: 0.56
Reference ID: 3982583

## 2021-08-08 LAB — HEPATITIS C RNA QUANTITATIVE
HCV Quantitative Log: 5.12 log IU/mL — ABNORMAL HIGH
HCV RNA, PCR, QN: 132000 IU/mL — ABNORMAL HIGH

## 2021-08-25 ENCOUNTER — Telehealth: Payer: Self-pay

## 2021-08-25 ENCOUNTER — Ambulatory Visit: Payer: Medicare Other | Admitting: Internal Medicine

## 2021-08-25 ENCOUNTER — Other Ambulatory Visit (HOSPITAL_COMMUNITY): Payer: Self-pay

## 2021-08-25 NOTE — Telephone Encounter (Signed)
[facility] Patient Advocate Encounter   Received notification from [employer] that prior authorization for Mavyret is required.   PA submitted on 08/25/21 Key [id number] Status is pending    I called and started a PA # [facility] will continue to follow.   Ileene Patrick, [facility] Patient [facility] for Infectious Disease Phone: [phone] Fax:  [phone]

## 2021-08-26 ENCOUNTER — Telehealth: Payer: Self-pay

## 2021-08-26 ENCOUNTER — Other Ambulatory Visit (HOSPITAL_COMMUNITY): Payer: Self-pay

## 2021-08-26 ENCOUNTER — Telehealth: Payer: Self-pay | Admitting: Pharmacist

## 2021-08-26 ENCOUNTER — Other Ambulatory Visit: Payer: Self-pay | Admitting: Internal Medicine

## 2021-08-26 ENCOUNTER — Other Ambulatory Visit: Payer: Self-pay | Admitting: Pharmacist

## 2021-08-26 DIAGNOSIS — B182 Chronic viral hepatitis C: Secondary | ICD-10-CM

## 2021-08-26 MED ORDER — SOFOSBUVIR-VELPATASVIR 400-100 MG PO TABS: 1.0000 | ORAL_TABLET | Freq: Every day | ORAL | 2 refills | Status: DC

## 2021-08-26 MED ORDER — SOFOSBUVIR-VELPATASVIR 400-100 MG PO TABS
1.0000 | ORAL_TABLET | Freq: Every day | ORAL | 2 refills | Status: DC
  Filled 2021-08-26 (×2): qty 28, 28d supply, fill #0
  Filled 2021-09-17: qty 28, 28d supply, fill #1
  Filled 2021-10-14: qty 28, 28d supply, fill #2

## 2021-08-26 NOTE — Telephone Encounter (Signed)
Let's try Epclusa for 12 weeks. No issues. Drug interaction with Eliquis, but recommendation is to monitor.

## 2021-08-26 NOTE — Telephone Encounter (Signed)
Patient is approved to receive Epclusa x 12 weeks for chronic Hepatitis C infection. Counseled patient to take Epclusa daily with or without food. Encouraged patient not to miss any doses and explained how their chance of cure could go down with each dose missed. Counseled patient on what to do if dose is missed - if it is closer to the missed dose take immediately; if closer to next dose skip dose and take the next dose at the usual time. Counseled patient on common side effects such as headache, fatigue, and nausea and that these normally decrease with time. I reviewed patient medications and found one drug interaction with atorvastatin. Will continue current atorvastatin dosing and ensure patient is not developing any myopathy on therapy. Discussed with patient that there are several drug interactions including acid suppressants. Instructed patient to call clinic if he wishes to start a new medication during course of therapy. Also advised patient to call if me experiences any side effects. Patient will follow-up with Dr. Megan Salon on 9/6.

## 2021-08-26 NOTE — Telephone Encounter (Signed)
[facility] Patient Advocate Encounter  Prior Authorization for Jordan [name] has been approved.    PA# [id number] Effective dates: 07/27/21 through 11/223/22  Patients co-pay is $45.00.   Prescription can be sent to [facility].  I was able to get patient a copay coupon card to make copay $0.00     [facility] will continue to follow.  Ileene Patrick, [facility] Patient [facility] for Infectious Disease Phone: [phone] Fax:  [phone]

## 2021-08-26 NOTE — Telephone Encounter (Signed)
[facility] Patient Advocate Encounter   I was successful in securing patient an $5600.00 grant from Patient [employer] [facility]) to provide copayment coverage for Epclusa.  This will make the out of pocket cost $0.00.     I have spoken with the patient.    The billing information is as follows and has been shared with [facility].       Dates of Eligibility: 05/28/21 through 0830/23  Patient knows to call the office with questions or concerns.  Ileene Patrick, [facility] Patient [facility] for Infectious Disease Phone: [phone] Fax:  [phone]

## 2021-08-26 NOTE — Telephone Encounter (Signed)
[facility] Patient Advocate Encounter  Received notification from [employer] that the request for prior authorization for Mavyret has been denied due to patient will need to tried and failed Harvoni or Epclusa first..      Ileene Patrick, [place] Patient [facility] for Infectious Disease Phone: [phone] Fax:  [phone]

## 2021-08-28 ENCOUNTER — Other Ambulatory Visit (HOSPITAL_COMMUNITY): Payer: Self-pay

## 2021-08-28 ENCOUNTER — Telehealth: Payer: Self-pay

## 2021-08-28 NOTE — Telephone Encounter (Signed)
[facility] Patient Advocate Encounter  Patient's medication Jordan [name]) have been couriered to [facility] from [facility] and will be picked up 09/01/21.  Jordan [name] , [facility] Patient [facility] for Infectious Disease Phone: [phone] Fax:  [phone]

## 2021-09-01 ENCOUNTER — Ambulatory Visit (INDEPENDENT_AMBULATORY_CARE_PROVIDER_SITE_OTHER): Payer: Medicare Other | Admitting: Internal Medicine

## 2021-09-01 ENCOUNTER — Other Ambulatory Visit: Payer: Self-pay

## 2021-09-01 DIAGNOSIS — B182 Chronic viral hepatitis C: Secondary | ICD-10-CM | POA: Diagnosis not present

## 2021-09-01 MED ORDER — SOFOSBUVIR-VELPATASVIR 400-100 MG PO TABS: 1.0000 | ORAL_TABLET | Freq: Every day | ORAL | 2 refills | Status: DC

## 2021-09-01 NOTE — Assessment & Plan Note (Signed)
He has chronic active hepatitis C.  I will check an abdominal ultrasound.  I went over how to take Epclusa and the importance of not missing doses.  He will follow-up for repeat viral load testing in 4 weeks.

## 2021-09-01 NOTE — Progress Notes (Signed)
[facility] for Infectious Disease  Patient Active Problem List   Diagnosis Date Noted   Chronic hepatitis C without hepatic coma ([place])     Priority: High   Stage 3a chronic kidney disease ([place])    History of gout    Substance abuse ([place])    Right thalamic infarction ([place]) 04/27/2021   Personal history of colonic polyps 01/16/2019   Family history of colon cancer in mother 01/16/2019   Screening for lipid disorders 05/03/2018   NICM (nonischemic cardiomyopathy) ([place]) [id number]   Chronic systolic heart failure ([place]) 02/15/2017   Essential hypertension 01/07/2017   Persistent atrial fibrillation ([place]) 03/15/2013   Long term (current) use of anticoagulants 03/15/2013   History of prostate cancer 02/16/2013    Patient's Medications  New Prescriptions   SOFOSBUVIR-VELPATASVIR (EPCLUSA) 400-100 MG TABS    Take 1 tablet by mouth daily.  Previous Medications   ACETAMINOPHEN (TYLENOL) 325 MG TABLET    Take 1-2 tablets (325-650 mg total) by mouth every 4 (four) hours as needed for mild pain.   ALLOPURINOL (ZYLOPRIM) 100 MG TABLET    Take 1 tablet (100 mg total) by mouth daily.   APIXABAN (ELIQUIS) 5 MG TABS TABLET    Take 1 tablet (5 mg total) by mouth 2 (two) times daily.   ATORVASTATIN (LIPITOR) 40 MG TABLET    Take 1 tablet (40 mg total) by mouth daily.   CARVEDILOL (COREG) 25 MG TABLET    TAKE 1 TABLET(25 MG) BY MOUTH TWICE DAILY WITH A MEAL   DICLOFENAC SODIUM (VOLTAREN) 1 % GEL    Apply 2 g topically 4 (four) times daily as needed (Pain in foot - 1st line).   HYDRALAZINE (APRESOLINE) 50 MG TABLET    Take 1 tablet (50 mg total) by mouth 3 (three) times daily.   LOSARTAN (COZAAR) 100 MG TABLET    Take 1 tablet (100 mg total) by mouth daily.   SOFOSBUVIR-VELPATASVIR (EPCLUSA) 400-100 MG TABS    Take 1 tablet by mouth daily.  Modified Medications   No medications on file  Discontinued Medications   No medications on file    Subjective: Jordan [name] is in for his routine  follow-up visit.  He was diagnosed with active hepatitis C in 2016.  Unfortunately he did not follow-up as planned and was never completely evaluated or treated.  He was hospitalized in May of this year with an acute CVA.  On admission he had slight elevation of his transaminases which rapidly returned to normal.  His hepatitis C viral load was obtained and was 81,500.  His genotype was performed and he had hepatitis C subtype 1B.  He believes that his risk factor was drug use many years ago.  He has never had a blood transfusion. He has never had any liver problems that he is aware of.  He says that he drinks alcohol infrequently and not to excess.  He is very much interested in treatment for his hepatitis C. he is here today to start therapy with Epclusa.  Review of Systems: Review of Systems  Constitutional:  Negative for fever.  Gastrointestinal:  Negative for abdominal pain, nausea and vomiting.   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[employer])    history of    Personal history of colonic polyps    Prostate cancer [facility])    radical prostatectomy     Social History   Tobacco Use   Smoking status: Never   Smokeless tobacco: Never  Vaping Use   Vaping Use: Never used  Substance Use Topics   Alcohol use: Yes    Alcohol/week: 3.0 standard drinks    Types: 3 Glasses of wine per week    Comment: occasional    Drug use: No    Family History  Problem Relation Age of Onset   Brain cancer Mother        brain tumor   Colon cancer Mother    Stroke Father    Rectal cancer Neg Hx    Stomach cancer Neg Hx    Esophageal cancer Neg Hx     No Known  Allergies  Objective: Vitals:   09/01/21 1521  BP: 120/80  Pulse: 89  Temp: (!) 97.5 F (36.4 C)  TempSrc: Oral  SpO2: 99%  Weight: 207 lb 6.4 oz (94.1 kg)   Body mass index is 28.13 kg/m.  Physical Exam Constitutional:      Comments: He is in good spirits.  Cardiovascular:     Rate and Rhythm: Normal rate.  Pulmonary:     Effort: Pulmonary effort is normal.  Psychiatric:        Mood and Affect: Mood normal.    Lab Results 08/04/2021 CMP     Component Value Date/Time   NA 141 08/04/2021 1553   NA 137 06/13/2018 0934   K 4.0 08/04/2021 1553   CL 111 (H) 08/04/2021 1553   CO2 21 08/04/2021 1553   GLUCOSE 99 08/04/2021 1553   BUN 17 08/04/2021 1553   BUN 19 06/13/2018 0934   CREATININE 1.42 (H) 08/04/2021 1553   CALCIUM 9.4 08/04/2021 1553   PROT 6.9 08/04/2021 1553   PROT 7.3 05/03/2018 1017   ALBUMIN 2.3 (L) 05/02/2021 0506   ALBUMIN 4.0 05/03/2018 1017   AST 49 (H) 08/04/2021 1553   ALT 57 (H) 08/04/2021 1553   ALT 57 (H) 08/04/2021 1553   ALKPHOS 58 05/02/2021 0506   BILITOT 2.1 (H) 08/04/2021 1553   BILITOT 1.2 05/03/2018 1017   GFRNONAA 49 (L) 05/18/2021 0635   GFRAA >60 11/26/2018 1850  Hepatitis C viral load 132,000 Fibrosis score F4   Problem List Items Addressed This Visit       High   Chronic hepatitis C without hepatic coma ([place])    He has chronic active hepatitis C.  I will check an abdominal ultrasound.  I went over how to take Epclusa and the importance of not missing doses.  He will follow-up for repeat viral load testing in 4 weeks.      Relevant Medications   Sofosbuvir-Velpatasvir (EPCLUSA) 400-100 MG TABS   Other Relevant Orders   Hepatitis B surface antibody,qualitative   Hepatitis B surface antigen   US Abdomen Complete   HIV antibody (with reflex)     Michel Bickers, MD [facility] [place] [facility] [phone] pager   [phone] cell 09/01/2021, 3:43 PM

## 2021-09-02 LAB — HIV ANTIBODY (ROUTINE TESTING W REFLEX): HIV 1&2 Ab, 4th Generation: NONREACTIVE

## 2021-09-02 LAB — HEPATITIS B SURFACE ANTIGEN: Hepatitis B Surface Ag: NONREACTIVE

## 2021-09-06 ENCOUNTER — Encounter (HOSPITAL_COMMUNITY): Payer: Self-pay | Admitting: Pharmacist Clinician (PhC)/ Clinical Pharmacy Specialist

## 2021-09-09 ENCOUNTER — Ambulatory Visit
Admission: RE | Admit: 2021-09-09 | Discharge: 2021-09-09 | Disposition: A | Discharge status: Home / Self Care | Payer: Medicare Other | Source: Ambulatory Visit | Attending: Internal Medicine | Admitting: Internal Medicine

## 2021-09-09 DIAGNOSIS — B182 Chronic viral hepatitis C: Secondary | ICD-10-CM

## 2021-09-14 ENCOUNTER — Ambulatory Visit
Admission: RE | Admit: 2021-09-14 | Discharge: 2021-09-14 | Disposition: A | Discharge status: Home / Self Care | Payer: Medicare Other | Source: Ambulatory Visit | Attending: Internal Medicine | Admitting: Internal Medicine

## 2021-09-17 ENCOUNTER — Other Ambulatory Visit (HOSPITAL_COMMUNITY): Payer: Self-pay

## 2021-09-18 ENCOUNTER — Other Ambulatory Visit (HOSPITAL_COMMUNITY): Payer: Self-pay

## 2021-09-18 ENCOUNTER — Telehealth: Payer: Self-pay

## 2021-09-18 NOTE — Telephone Encounter (Signed)
[facility] Patient Advocate Encounter  Patient's medications have been couriered to [facility] from [facility] and will be picked up.  I left a message on patient voicemail.   Ileene Patrick , [facility] Patient [facility] for Infectious Disease Phone: [phone] Fax:  [phone]

## 2021-09-24 ENCOUNTER — Telehealth: Payer: Self-pay | Admitting: Internal Medicine

## 2021-09-24 NOTE — Telephone Encounter (Signed)
Pt called to report he's been experiencing SOB and wheezing for the past couple of days. He denies swelling or significant change in weight but state symptoms is more pronounced with exertion and laying down. He state he was evaluated by pcp yesterday who drew labs and recommended contacting cardiologist. He state pcp indicated lab work revealed fluid build up and will forward to our office for review.  Pt also state he's been experiencing dizziness when he walk or stand up. He report yesterday during home PT session, the therapist contacted EMS due to BP being high. He state systolic was 719, and not for sure, but believe diastolic was close to 597.      Appointment scheduled for 10/4 with Laurann Montana, NP for further evaluations. Will forward to MD for recommendations in the meantime. Pt also made aware of ED precaution should any new symptoms develop or worsen.

## 2021-09-24 NOTE — Telephone Encounter (Signed)
Continue current meds until he follows up with [facility].  Dr Lemmie Evens

## 2021-09-24 NOTE — Telephone Encounter (Signed)
Pt c/o Shortness Of Breath: STAT if SOB developed within the last 24 hours or pt is noticeably SOB on the phone  1. Are you currently SOB (can you hear that pt is SOB on the phone)? no  2. How long have you been experiencing SOB? Couples   3. Are you SOB when sitting or when up moving around? Both   4. Are you currently experiencing any other symptoms? Dizziness and lightheaded whenever he stands. When to the PCP on yesterday the dr told him he has build up fluid form his lab work. Patient also states he is starting to get a cough

## 2021-09-25 NOTE — Telephone Encounter (Signed)
Attempted to contact. Nurse continue to receive message stating your call can not be completed as dial.

## 2021-09-29 ENCOUNTER — Encounter (HOSPITAL_COMMUNITY): Payer: Self-pay | Admitting: Internal Medicine

## 2021-09-29 ENCOUNTER — Encounter (HOSPITAL_BASED_OUTPATIENT_CLINIC_OR_DEPARTMENT_OTHER): Payer: Self-pay | Admitting: Family

## 2021-09-29 ENCOUNTER — Other Ambulatory Visit: Payer: Self-pay

## 2021-09-29 ENCOUNTER — Ambulatory Visit (INDEPENDENT_AMBULATORY_CARE_PROVIDER_SITE_OTHER): Payer: Medicare Other | Admitting: Family

## 2021-09-29 VITALS — BP 150/90 | HR 111 | Ht 72.0 in | Wt 205.0 lb

## 2021-09-29 DIAGNOSIS — I10 Essential (primary) hypertension: Secondary | ICD-10-CM | POA: Diagnosis not present

## 2021-09-29 DIAGNOSIS — Z7901 Long term (current) use of anticoagulants: Secondary | ICD-10-CM | POA: Diagnosis not present

## 2021-09-29 DIAGNOSIS — Z8673 Personal history of transient ischemic attack (TIA), and cerebral infarction without residual deficits: Secondary | ICD-10-CM

## 2021-09-29 DIAGNOSIS — I480 Paroxysmal atrial fibrillation: Secondary | ICD-10-CM | POA: Diagnosis not present

## 2021-09-29 NOTE — Patient Instructions (Signed)
Medication Instructions:  Your physician has recommended you make the following change in your medication:   CONTINUE Eliquis. It is very important to not miss a dose.   TAKE fluid pill as directed by Dr. Fara Olden. Take one tablet daily in the morning for 3 days. Then take only as needed for weight gain of 2 pounds overnight or 5 pounds in one week.   *If you need a refill on your cardiac medications before your next appointment, please call your pharmacy*   Lab Work: Your physician recommends that you return for lab work one day 09/30/21-10/02/21  for BMP, CBC   If you have labs (blood work) drawn today and your tests are completely normal, you will receive your results only by: [place] (if you have MyChart) OR A paper copy in the mail If you have any lab test that is abnormal or we need to change your treatment, we will call you to review the results.   Testing/Procedures: Your EKG today showed atrial fibrillation. This irregular heart beat is likely causing you to hand onto fluid.   Your physician has recommended that you have a Cardioversion (DCCV). Electrical Cardioversion uses a jolt of electricity to your heart either through paddles or wired patches attached to your chest. This is a controlled, usually prescheduled, procedure. Defibrillation is done under light anesthesia in the hospital, and you usually go home the day of the procedure. This is done to get your heart back into a normal rhythm. You are not awake for the procedure. Please see the instruction sheet given to you today.   Follow-Up: At [facility] you and your health needs are our priority.  As part of our continuing mission to provide you with exceptional heart care, we have created designated Provider Care Teams.  These Care Teams include your primary Cardiologist (physician) and Advanced Practice Providers (APPs -  Physician Assistants and Nurse Practitioners) who all work together to provide you with the  care you need, when you need it.  We recommend signing up for the patient portal called "MyChart".  Sign up information is provided on this After Visit Summary.  MyChart is used to connect with patients for Virtual Visits (Telemedicine).  Patients are able to view lab/test results, encounter notes, upcoming appointments, etc.  Non-urgent messages can be sent to your provider as well.   To learn more about what you can do with MyChart, go to [email or web address].    Your next appointment:   As scheduled with Almyra Deforest, PA   Other Instruction  Follow a low salt diet and drink less than 2 liters of fluid per day.   You are scheduled for a Cardioversion on 10/07/21 with Dr. Debara Pickett.  Please arrive at the [facility] (Main Entrance A) at [facility]: [place], [place] [address] at 12 pm.   DIET: Nothing to eat or drink after midnight except a sip of water with medications (see medication instructions below)  FYI: For your safety, and to allow [place] to monitor your vital signs accurately during the surgery/procedure we request that  if you have artificial nails, gel coating, SNS etc. Please have those removed prior to your surgery/procedure. Not having the nail coverings /polish removed may result in cancellation or delay of your surgery/procedure.   Medication Instructions: Hold your fluid pill  Continue your anticoagulant: Eliquis You will need to continue your anticoagulant after your procedure until you  are told by your  Provider that it is  safe to stop   Labs: One day 09/30/21-10/02/21  You must have a responsible person to drive you home and stay in the waiting area during your procedure. Failure to do so could result in cancellation.  Bring your insurance cards.  *Special Note: Every effort is made to have your procedure done on time. Occasionally there are emergencies that occur at the hospital that may cause delays. Please be patient if a delay does occur.

## 2021-09-29 NOTE — H&P (View-Only) (Signed)
Office Visit    Patient Name: Jordan [name]. Date of Encounter: 09/29/2021 Option to take and stay very predominantly get PCP:  [name], [place]  Cardiologist:  Pixie Casino, MD  Advanced Practice Provider:  No care team member to display Electrophysiologist:  None    Chief Complaint    Jordan Masten. is a 75 y.o. 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 Risa Grill   TRANSTHORACIC ECHOCARDIOGRAM  05/2006   EF ~98%; LV systolic function normal; mild focal basal septal hypertrophy; AV thickness mildly increased; LA mod-markedly dilate    Allergies  No Known Allergies  History of Present Illness    Jordan [name]. is a 75 y.o. male with a hx of  paroxysmal atrial fibrillation, DVT, HFrEF, hypertension, CVA last seen  07/06/21.  He has previously followed with Dr. Debara Pickett. Most recently evaluated in clinic 11/24/20 doing overall well from cardiac perspective. He had some gout with hydrochlorothiazide and was started on allopurinol.   He was admitted 04/27/21 MRI/MRA brain showing subacute infarct in ventral medial  right thalamus with small amount of petechial hemorrhage. Echo 5/3/2022LVEF 45 to 50%, wall motion abnormalities, indeterminate diastolic parameters, RV normal function, RV mildly enlarged, bilateral atria severely dilated, mild AI, mild to moderate aortic valve sclerosis without stenosis, borderline dilation of ascending aorta 39 mm.  Carotid duplex 04/28/21 with no carotid artery stenosis. Dr. Erlinda Hong of neurology felt stroke likely due to small vessel disease. BP was labile and Hydralazine added. Warfarin transitioned to Eliquis at discharge.He was discharged to CIR.   Seen in follow-up 07/06/2021.  He was doing overall well with no chest pain or dyspnea.  He was tired with more than usual activity but it was overall improving since discharge.  He was referred to infectious disease for treatment of hepatitis C.  He presents today for follow-up with his son.  He has started Eplusa for treatment of hepatitis C. Notes fatigue over the last 10 days and wonders if it is a side effect. Also notes lightheadedness. No chest pain, pressure, tightness. Does note dyspnea on exertion. Per his report he saw primary care recently who did labs which are unavailable for my review but showed fluid volume overload. His PCP has prescribed a "fluid pill" for 3 days then as needed. He is not sure if it is Furosemide or Torsemide as he is picking up from the pharmacy today. He has a BP cuff  at home but has not been checking recently.   EKGs/Labs/Other Studies Reviewed:   The following studies were reviewed today:  Echo 04/28/21  1. Atrial fibrillation present. Left ventricular ejection fraction, by  estimation, is 45 to 50%. The left ventricle has mildly decreased  function. The left ventricle demonstrates regional wall motion  abnormalities (see scoring diagram/findings for  description). Left ventricular diastolic parameters are indeterminate.   2. Right ventricular systolic function is normal. The right ventricular  size is  mildly enlarged. There is normal pulmonary artery systolic  pressure.   3. Left atrial size was severely dilated.   4. Right atrial size was severely dilated.   5. The mitral valve is normal in structure. No evidence of mitral valve  regurgitation. No evidence of mitral stenosis.   6. The aortic valve is tricuspid. Aortic valve regurgitation is mild.  Mild to moderate aortic valve sclerosis/calcification is present, without  any evidence of aortic stenosis.   7. There is borderline dilatation of the ascending aorta, measuring 39  mm.   8. The inferior vena cava is normal in size with greater than 50%  respiratory variability, suggesting right atrial pressure of 3 mmHg.   EKG:  EKG ordered today. The EKG performed today demonstrates atrial fibrillation 11 bpm with right superior axis deviation and prolonged QT (QTc 522).   Recent Labs: 04/27/2021: B Natriuretic Peptide 446.7; Magnesium 2.0; TSH 2.994 08/04/2021: ALT 57; ALT 57; BUN 17; Creat 1.42; Hemoglobin 16.2; Platelets 158; Potassium 4.0; Sodium 141  Recent Lipid Panel    Component Value Date/Time   CHOL 150 04/28/2021 0446   CHOL 157 05/03/2018 1017   TRIG 51 04/28/2021 0446   HDL 37 (L) 04/28/2021 0446   HDL 55 05/03/2018 1017   CHOLHDL 4.1 04/28/2021 0446   VLDL 10 04/28/2021 0446   LDLCALC 103 (H) 04/28/2021 0446   LDLCALC 88 05/03/2018 1017    Home Medications   Current Meds  Medication Sig   acetaminophen (TYLENOL) 325 MG tablet Take 1-2 tablets (325-650 mg total) by mouth every 4 (four) hours as needed for mild pain.   allopurinol (ZYLOPRIM) 100 MG tablet Take 1 tablet (100 mg total) by mouth daily.   apixaban (ELIQUIS) 5 MG TABS tablet Take 1 tablet (5 mg total) by mouth 2 (two) times daily.   atorvastatin (LIPITOR) 40 MG tablet Take 1 tablet (40 mg total) by mouth daily.   carvedilol (COREG) 25 MG tablet TAKE 1 TABLET(25 MG) BY MOUTH TWICE DAILY WITH A MEAL   diclofenac Sodium (VOLTAREN) 1 % GEL Apply 2 g topically  4 (four) times daily as needed (Pain in foot - 1st line).   hydrALAZINE (APRESOLINE) 50 MG tablet Take 1 tablet (50 mg total) by mouth 3 (three) times daily.   losartan (COZAAR) 100 MG tablet Take 1 tablet (100 mg total) by mouth daily.   ofloxacin (OCUFLOX) 0.3 % ophthalmic solution Place 1 drop into the left eye daily.   prednisoLONE acetate (PRED FORTE) 1 % ophthalmic suspension Place 1 drop into the left eye daily.   Sofosbuvir-Velpatasvir (EPCLUSA) 400-100 MG TABS Take 1 tablet by mouth daily.     Review of Systems      All other systems reviewed and are otherwise negative except as noted above.  Physical Exam    VS:  BP (!) 160/126   Pulse (!) 111   Ht 6' (1.829 m)   Wt 205 lb (93 kg)   SpO2 100%   BMI 27.80  kg/m  , BMI Body mass index is 27.8 kg/m.  Wt Readings from Last 3 Encounters:  09/29/21 205 lb (93 kg)  09/01/21 207 lb 6.4 oz (94.1 kg)  08/04/21 208 lb (94.3 kg)     GEN: Well nourished, well developed, in no acute distress. HEENT: normal. Neck: Supple, no JVD, carotid bruits, or masses. Cardiac: irregularly irregular, no murmurs, rubs, or gallops. No clubbing, cyanosis, edema.  Radials/PT 2+ and equal bilaterally.  Respiratory:  Respirations regular and unlabored, clear to auscultation bilaterally. GI: Soft, nontender, nondistended. MS: No deformity or atrophy. Skin: Warm and dry, no rash. Neuro:  Strength and sensation are intact. Psych: Normal affect.  Assessment & Plan   Persistent atrial fibrillation / Chronic anticoagulation - 08/04/21 Hb 16.2.  Continue Eliquis 5 mg twice daily due to CHA2DS2-VASc of at least 5 (age, HTN, NICM, CVAx2).  Denies bleeding complications.  EKG today shows recurrent atrial fibrillation which is symptomatic with lightheadedness, fatigue, fluid retention. He will continue Coreg 25mg  BID. CBC, BMp this week. He has not missed any doses of Eliqui over the last 3 weeks.  Will plan for cardioversion. If does not sustain NSR post  cardioversion, consider AAD. Hesitant to add CCb as when he is in NSR his heart rate is routinely in the 60s.   Shared Decision Making/Informed Consent{ The risks (stroke, cardiac arrhythmias rarely resulting in the need for a temporary or permanent pacemaker, skin irritation or burns and complications associated with conscious sedation including aspiration, arrhythmia, respiratory failure and death), benefits (restoration of normal sinus rhythm) and alternatives of a direct current cardioversion were explained in detail to Jordan [name] and he agrees to proceed.    NICM / HFrEF - Echo 04/28/21 LVEF 45-50%, indeterminite diastolic parameters.  GDMT includes losartan, carvedilol, hydralazine.  Low-sodium diet and fluid restriction less than 2 L encouraged.Heart healthy diet and regular cardiovascular exercise encouraged. Now with volume overload in setting of atrial fibrillation. PCP has started diuretic (unknown whether Furosemide or Torsemide) daily for 3 days then as needed for weight gain of 2 lbs overnight or 5 lbs in one week, agree with plan.   HTN -blood pressure elevated today though difficult to auscultate in setting of rapid atrial fibrillation.  Continue losartan 100 mg daily, carvedilol 25 mg twice daily, Hydralazine 50mg  TID.Marland Kitchen He will continue home monitoring and we will readdress at follow up. Could consider further escalation of Hydralazine.   Hx of CVA -continue to follow with neurology. Continue optimal blood pressure, lipid control.  Continue statin.  No aspirin due to chronic anticoagulation secondary to paroxysmal atrial fibrillation.  Borderline dilation of ascending aorta by echo 04/28/2021 measuring 39 mm -continue optimal BP and volume control.  Consider repeat echocardiogram in 1 year.  Disposition: Follow up as scheduled 10/21/21 with Almyra Deforest, PA  Signed, Loel Dubonnet, NP 09/29/2021, 9:56 AM [place]

## 2021-09-29 NOTE — Telephone Encounter (Signed)
Patient saw Urban Gibson NP on 09/29/21

## 2021-09-29 NOTE — Progress Notes (Signed)
Office Visit    Patient Name: Jordan [name]. Date of Encounter: 09/29/2021 Option to take and stay very predominantly get PCP:  [name], [place]  Cardiologist:  Pixie Casino, MD  Advanced Practice Provider:  No care team member to display Electrophysiologist:  None    Chief Complaint    Jordan Wiegman. is a 75 y.o. 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 Risa Grill   TRANSTHORACIC ECHOCARDIOGRAM  05/2006   EF ~09%; LV systolic function normal; mild focal basal septal hypertrophy; AV thickness mildly increased; LA mod-markedly dilate    Allergies  No Known Allergies  History of Present Illness    Jordan Sansom. is a 75 y.o. male with a hx of  paroxysmal atrial fibrillation, DVT, HFrEF, hypertension, CVA last seen  07/06/21.  He has previously followed with Dr. Debara Pickett. Most recently evaluated in clinic 11/24/20 doing overall well from cardiac perspective. He had some gout with hydrochlorothiazide and was started on allopurinol.   He was admitted 04/27/21 MRI/MRA brain showing subacute infarct in ventral medial  right thalamus with small amount of petechial hemorrhage. Echo 5/3/2022LVEF 45 to 50%, wall motion abnormalities, indeterminate diastolic parameters, RV normal function, RV mildly enlarged, bilateral atria severely dilated, mild AI, mild to moderate aortic valve sclerosis without stenosis, borderline dilation of ascending aorta 39 mm.  Carotid duplex 04/28/21 with no carotid artery stenosis. Dr. Erlinda Hong of neurology felt stroke likely due to small vessel disease. BP was labile and Hydralazine added. Warfarin transitioned to Eliquis at discharge.He was discharged to CIR.   Seen in follow-up 07/06/2021.  He was doing overall well with no chest pain or dyspnea.  He was tired with more than usual activity but it was overall improving since discharge.  He was referred to infectious disease for treatment of hepatitis C.  He presents today for follow-up with his son.  He has started Eplusa for treatment of hepatitis C. Notes fatigue over the last 10 days and wonders if it is a side effect. Also notes lightheadedness. No chest pain, pressure, tightness. Does note dyspnea on exertion. Per his report he saw primary care recently who did labs which are unavailable for my review but showed fluid volume overload. His PCP has prescribed a "fluid pill" for 3 days then as needed. He is not sure if it is Furosemide or Torsemide as he is picking up from the pharmacy today. He has a BP cuff  at home but has not been checking recently.   EKGs/Labs/Other Studies Reviewed:   The following studies were reviewed today:  Echo 04/28/21  1. Atrial fibrillation present. Left ventricular ejection fraction, by  estimation, is 45 to 50%. The left ventricle has mildly decreased  function. The left ventricle demonstrates regional wall motion  abnormalities (see scoring diagram/findings for  description). Left ventricular diastolic parameters are indeterminate.   2. Right ventricular systolic function is normal. The right ventricular  size is  mildly enlarged. There is normal pulmonary artery systolic  pressure.   3. Left atrial size was severely dilated.   4. Right atrial size was severely dilated.   5. The mitral valve is normal in structure. No evidence of mitral valve  regurgitation. No evidence of mitral stenosis.   6. The aortic valve is tricuspid. Aortic valve regurgitation is mild.  Mild to moderate aortic valve sclerosis/calcification is present, without  any evidence of aortic stenosis.   7. There is borderline dilatation of the ascending aorta, measuring 39  mm.   8. The inferior vena cava is normal in size with greater than 50%  respiratory variability, suggesting right atrial pressure of 3 mmHg.   EKG:  EKG ordered today. The EKG performed today demonstrates atrial fibrillation 11 bpm with right superior axis deviation and prolonged QT (QTc 522).   Recent Labs: 04/27/2021: B Natriuretic Peptide 446.7; Magnesium 2.0; TSH 2.994 08/04/2021: ALT 57; ALT 57; BUN 17; Creat 1.42; Hemoglobin 16.2; Platelets 158; Potassium 4.0; Sodium 141  Recent Lipid Panel    Component Value Date/Time   CHOL 150 04/28/2021 0446   CHOL 157 05/03/2018 1017   TRIG 51 04/28/2021 0446   HDL 37 (L) 04/28/2021 0446   HDL 55 05/03/2018 1017   CHOLHDL 4.1 04/28/2021 0446   VLDL 10 04/28/2021 0446   LDLCALC 103 (H) 04/28/2021 0446   LDLCALC 88 05/03/2018 1017    Home Medications   Current Meds  Medication Sig   acetaminophen (TYLENOL) 325 MG tablet Take 1-2 tablets (325-650 mg total) by mouth every 4 (four) hours as needed for mild pain.   allopurinol (ZYLOPRIM) 100 MG tablet Take 1 tablet (100 mg total) by mouth daily.   apixaban (ELIQUIS) 5 MG TABS tablet Take 1 tablet (5 mg total) by mouth 2 (two) times daily.   atorvastatin (LIPITOR) 40 MG tablet Take 1 tablet (40 mg total) by mouth daily.   carvedilol (COREG) 25 MG tablet TAKE 1 TABLET(25 MG) BY MOUTH TWICE DAILY WITH A MEAL   diclofenac Sodium (VOLTAREN) 1 % GEL Apply 2 g topically  4 (four) times daily as needed (Pain in foot - 1st line).   hydrALAZINE (APRESOLINE) 50 MG tablet Take 1 tablet (50 mg total) by mouth 3 (three) times daily.   losartan (COZAAR) 100 MG tablet Take 1 tablet (100 mg total) by mouth daily.   ofloxacin (OCUFLOX) 0.3 % ophthalmic solution Place 1 drop into the left eye daily.   prednisoLONE acetate (PRED FORTE) 1 % ophthalmic suspension Place 1 drop into the left eye daily.   Sofosbuvir-Velpatasvir (EPCLUSA) 400-100 MG TABS Take 1 tablet by mouth daily.     Review of Systems      All other systems reviewed and are otherwise negative except as noted above.  Physical Exam    VS:  BP (!) 160/126   Pulse (!) 111   Ht 6' (1.829 m)   Wt 205 lb (93 kg)   SpO2 100%   BMI 27.80  kg/m  , BMI Body mass index is 27.8 kg/m.  Wt Readings from Last 3 Encounters:  09/29/21 205 lb (93 kg)  09/01/21 207 lb 6.4 oz (94.1 kg)  08/04/21 208 lb (94.3 kg)     GEN: Well nourished, well developed, in no acute distress. HEENT: normal. Neck: Supple, no JVD, carotid bruits, or masses. Cardiac: irregularly irregular, no murmurs, rubs, or gallops. No clubbing, cyanosis, edema.  Radials/PT 2+ and equal bilaterally.  Respiratory:  Respirations regular and unlabored, clear to auscultation bilaterally. GI: Soft, nontender, nondistended. MS: No deformity or atrophy. Skin: Warm and dry, no rash. Neuro:  Strength and sensation are intact. Psych: Normal affect.  Assessment & Plan   Persistent atrial fibrillation / Chronic anticoagulation - 08/04/21 Hb 16.2.  Continue Eliquis 5 mg twice daily due to CHA2DS2-VASc of at least 5 (age, HTN, NICM, CVAx2).  Denies bleeding complications.  EKG today shows recurrent atrial fibrillation which is symptomatic with lightheadedness, fatigue, fluid retention. He will continue Coreg 25mg  BID. CBC, BMp this week. He has not missed any doses of Eliqui over the last 3 weeks.  Will plan for cardioversion. If does not sustain NSR post  cardioversion, consider AAD. Hesitant to add CCb as when he is in NSR his heart rate is routinely in the 60s.   Shared Decision Making/Informed Consent{ The risks (stroke, cardiac arrhythmias rarely resulting in the need for a temporary or permanent pacemaker, skin irritation or burns and complications associated with conscious sedation including aspiration, arrhythmia, respiratory failure and death), benefits (restoration of normal sinus rhythm) and alternatives of a direct current cardioversion were explained in detail to Jordan [name] and he agrees to proceed.    NICM / HFrEF - Echo 04/28/21 LVEF 45-50%, indeterminite diastolic parameters.  GDMT includes losartan, carvedilol, hydralazine.  Low-sodium diet and fluid restriction less than 2 L encouraged.Heart healthy diet and regular cardiovascular exercise encouraged. Now with volume overload in setting of atrial fibrillation. PCP has started diuretic (unknown whether Furosemide or Torsemide) daily for 3 days then as needed for weight gain of 2 lbs overnight or 5 lbs in one week, agree with plan.   HTN -blood pressure elevated today though difficult to auscultate in setting of rapid atrial fibrillation.  Continue losartan 100 mg daily, carvedilol 25 mg twice daily, Hydralazine 50mg  TID.Marland Kitchen He will continue home monitoring and we will readdress at follow up. Could consider further escalation of Hydralazine.   Hx of CVA -continue to follow with neurology. Continue optimal blood pressure, lipid control.  Continue statin.  No aspirin due to chronic anticoagulation secondary to paroxysmal atrial fibrillation.  Borderline dilation of ascending aorta by echo 04/28/2021 measuring 39 mm -continue optimal BP and volume control.  Consider repeat echocardiogram in 1 year.  Disposition: Follow up as scheduled 10/21/21 with Almyra Deforest, PA  Signed, Loel Dubonnet, NP 09/29/2021, 9:56 AM [place]

## 2021-10-01 ENCOUNTER — Telehealth: Payer: Self-pay

## 2021-10-02 NOTE — Telephone Encounter (Signed)
error 

## 2021-10-03 LAB — BASIC METABOLIC PANEL
BUN/Creatinine Ratio: 13 (ref 10–24)
BUN: 22 mg/dL (ref 8–27)
CO2: 21 mmol/L (ref 20–29)
Calcium: 9.3 mg/dL (ref 8.6–10.2)
Chloride: 107 mmol/L — ABNORMAL HIGH (ref 96–106)
Creatinine, Ser: 1.75 mg/dL — ABNORMAL HIGH (ref 0.76–1.27)
Glucose: 77 mg/dL (ref 70–99)
Potassium: 4 mmol/L (ref 3.5–5.2)
Sodium: 144 mmol/L (ref 134–144)
eGFR: 40 mL/min/{1.73_m2} — ABNORMAL LOW (ref >59)

## 2021-10-03 LAB — CBC
Hematocrit: 49.3 % (ref 37.5–51.0)
Hemoglobin: 16.8 g/dL (ref 13.0–17.7)
MCH: 29.7 pg (ref 26.6–33.0)
MCHC: 34.1 g/dL (ref 31.5–35.7)
MCV: 87 fL (ref 79–97)
Platelets: 201 10*3/uL (ref 150–450)
RBC: 5.66 x10E6/uL (ref 4.14–5.80)
RDW: 13.9 % (ref 11.6–15.4)
WBC: 6.5 10*3/uL (ref 3.4–10.8)

## 2021-10-06 ENCOUNTER — Other Ambulatory Visit: Payer: Self-pay

## 2021-10-06 ENCOUNTER — Ambulatory Visit (INDEPENDENT_AMBULATORY_CARE_PROVIDER_SITE_OTHER): Payer: Medicare Other | Admitting: Internal Medicine

## 2021-10-06 DIAGNOSIS — B182 Chronic viral hepatitis C: Secondary | ICD-10-CM

## 2021-10-06 NOTE — Assessment & Plan Note (Signed)
His adherence with Raeanne Gathers is perfect.  He will get a repeat viral load today and I will see him back in 1 month.

## 2021-10-06 NOTE — Progress Notes (Signed)
[facility] for Infectious Disease  Patient Active Problem List   Diagnosis Date Noted   Chronic hepatitis C without hepatic coma ([place])     Priority: 1.   Stage 3a chronic kidney disease ([place])    History of gout    Substance abuse ([place])    Right thalamic infarction ([place]) 04/27/2021   Personal history of colonic polyps 01/16/2019   Family history of colon cancer in mother 01/16/2019   Screening for lipid disorders 05/03/2018   NICM (nonischemic cardiomyopathy) ([place]) 13/24/4010   Chronic systolic heart failure ([place]) 02/15/2017   Essential hypertension 01/07/2017   Persistent atrial fibrillation ([place]) 03/15/2013   Long term (current) use of anticoagulants 03/15/2013   History of prostate cancer 02/16/2013    Patient's Medications  New Prescriptions   No medications on file  Previous Medications   ALLOPURINOL (ZYLOPRIM) 100 MG TABLET    Take 1 tablet (100 mg total) by mouth daily.   APIXABAN (ELIQUIS) 5 MG TABS TABLET    Take 1 tablet (5 mg total) by mouth 2 (two) times daily.   ATORVASTATIN (LIPITOR) 40 MG TABLET    Take 1 tablet (40 mg total) by mouth daily.   CARVEDILOL (COREG) 25 MG TABLET    TAKE 1 TABLET(25 MG) BY MOUTH TWICE DAILY WITH A MEAL   DICLOFENAC SODIUM (VOLTAREN) 1 % GEL    Apply 2 g topically 4 (four) times daily as needed (Pain in foot - 1st line).   FUROSEMIDE (LASIX) 20 MG TABLET    Take 20 mg by mouth daily as needed for edema.   HYDRALAZINE (APRESOLINE) 50 MG TABLET    Take 1 tablet (50 mg total) by mouth 3 (three) times daily.   LOSARTAN (COZAAR) 100 MG TABLET    Take 1 tablet (100 mg total) by mouth daily.   OFLOXACIN (OCUFLOX) 0.3 % OPHTHALMIC SOLUTION    Place 1 drop into the left eye daily.   PREDNISOLONE ACETATE (PRED FORTE) 1 % OPHTHALMIC SUSPENSION    Place 1 drop into the left eye daily.   SOFOSBUVIR-VELPATASVIR (EPCLUSA) 400-100 MG TABS    Take 1 tablet by mouth daily.  Modified Medications   No medications on file  Discontinued  Medications   No medications on file    Subjective: Jordan [name] is in for his visit.  He has chronic hepatitis C, genotype 1b.  He started [place] on 09/02/2021.  A recent ultrasound showed no liver masses.  Review of Systems: Review of Systems  Constitutional:  Positive for malaise/fatigue. Negative for fever.  Gastrointestinal:  Negative for abdominal pain, diarrhea, nausea and vomiting.   Past Medical History:  Diagnosis Date   Atrial fib/flutter, transient    Hx   Atrial fibrillation ([place])    coumadin   Colon cancer ([facility])    family hx   Diverticulosis    Gout    Hemorrhoids    Hepatitis C    History of DVT (deep vein thrombosis)    History of nuclear stress test 04/2002   exercise; normal study    Hypertension    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[place])    history of    Personal history of colonic polyps    Prostate cancer ([place])    radical prostatectomy     Social History   Tobacco Use   Smoking status: Never   Smokeless tobacco: Never  Vaping Use   Vaping Use: Never used  Substance Use Topics   Alcohol use:  Yes    Alcohol/week: 3.0 standard drinks    Types: 3 Glasses of wine per week    Comment: occasional    Drug use: No    Family History  Problem Relation Age of Onset   Brain cancer Mother        brain tumor   Colon cancer Mother    Stroke Father    Rectal cancer Neg Hx    Stomach cancer Neg Hx    Esophageal cancer Neg Hx     No Known Allergies  Objective: There were no vitals filed for this visit. There is no height or weight on file to calculate BMI.  Physical Exam Constitutional:      Comments: He is in good spirits.  Cardiovascular:     Rate and Rhythm: Normal rate. Rhythm irregular.     Pulses: Normal pulses.  Abdominal:     Palpations: Abdomen is soft. There is no mass.     Tenderness: There is no abdominal tenderness.  Psychiatric:        Mood and Affect: Mood normal.    Lab Results    Problem List Items Addressed This  Visit       1.   Chronic hepatitis C without hepatic coma ([facility])    His adherence with Epclusa is perfect.  He will get a repeat viral load today and I will see him back in 1 month.      Relevant Orders   Hepatitis C RNA quantitative     Michel Bickers, MD [facility] [phone] pager   [phone] cell 10/06/2021, 2:47 PM

## 2021-10-07 ENCOUNTER — Ambulatory Visit (HOSPITAL_COMMUNITY)
Admission: RE | Admit: 2021-10-07 | Discharge: 2021-10-07 | Disposition: A | Discharge status: Home / Self Care | Payer: Medicare Other | Attending: Internal Medicine | Admitting: Internal Medicine

## 2021-10-07 ENCOUNTER — Encounter (HOSPITAL_COMMUNITY): Payer: Self-pay | Admitting: Internal Medicine

## 2021-10-07 ENCOUNTER — Ambulatory Visit (HOSPITAL_COMMUNITY): Payer: Medicare Other | Admitting: Anesthesiology

## 2021-10-07 ENCOUNTER — Encounter (HOSPITAL_COMMUNITY)
Admission: RE | Disposition: A | Discharge status: Home / Self Care | Payer: Self-pay | Source: Home / Self Care | Attending: Internal Medicine

## 2021-10-07 ENCOUNTER — Other Ambulatory Visit: Payer: Self-pay

## 2021-10-07 DIAGNOSIS — I700 Atherosclerosis of aorta: Secondary | ICD-10-CM | POA: Insufficient documentation

## 2021-10-07 DIAGNOSIS — Z86718 Personal history of other venous thrombosis and embolism: Secondary | ICD-10-CM | POA: Diagnosis not present

## 2021-10-07 DIAGNOSIS — Z79899 Other long term (current) drug therapy: Secondary | ICD-10-CM | POA: Insufficient documentation

## 2021-10-07 DIAGNOSIS — I351 Nonrheumatic aortic (valve) insufficiency: Secondary | ICD-10-CM | POA: Diagnosis not present

## 2021-10-07 DIAGNOSIS — I428 Other cardiomyopathies: Secondary | ICD-10-CM | POA: Diagnosis not present

## 2021-10-07 DIAGNOSIS — I5020 Unspecified systolic (congestive) heart failure: Secondary | ICD-10-CM | POA: Diagnosis not present

## 2021-10-07 DIAGNOSIS — I480 Paroxysmal atrial fibrillation: Secondary | ICD-10-CM | POA: Insufficient documentation

## 2021-10-07 DIAGNOSIS — I110 Hypertensive heart disease with heart failure: Secondary | ICD-10-CM | POA: Diagnosis not present

## 2021-10-07 DIAGNOSIS — Z8673 Personal history of transient ischemic attack (TIA), and cerebral infarction without residual deficits: Secondary | ICD-10-CM | POA: Diagnosis not present

## 2021-10-07 DIAGNOSIS — Z7901 Long term (current) use of anticoagulants: Secondary | ICD-10-CM | POA: Insufficient documentation

## 2021-10-07 DIAGNOSIS — I4819 Other persistent atrial fibrillation: Secondary | ICD-10-CM | POA: Diagnosis not present

## 2021-10-07 HISTORY — PX: CARDIOVERSION: SHX1299

## 2021-10-07 SURGERY — CARDIOVERSION
Anesthesia: General

## 2021-10-07 MED ORDER — LIDOCAINE 2% (20 MG/ML) 5 ML SYRINGE
INTRAMUSCULAR | Status: DC | PRN
  Administered 2021-10-07: 40 mg via INTRAVENOUS

## 2021-10-07 MED ORDER — APIXABAN 5 MG PO TABS
5.0000 mg | ORAL_TABLET | Freq: Once | ORAL | Status: AC
  Administered 2021-10-07: 5 mg via ORAL
  Filled 2021-10-07: qty 1

## 2021-10-07 MED ORDER — SODIUM CHLORIDE 0.9 % IV SOLN: INTRAVENOUS | Status: DC | PRN

## 2021-10-07 MED ORDER — PROPOFOL 10 MG/ML IV BOLUS
INTRAVENOUS | Status: DC | PRN
  Administered 2021-10-07: 50 mg via INTRAVENOUS

## 2021-10-07 NOTE — Discharge Instructions (Signed)

## 2021-10-07 NOTE — Transfer of Care (Signed)
Immediate Anesthesia Transfer of Care Note  Patient: Jordan [name].  Procedure(s) Performed: CARDIOVERSION  Patient Location: Endoscopy Unit  Anesthesia Type:General  Level of Consciousness: drowsy  Airway & Oxygen Therapy: Patient Spontanous Breathing  Post-op Assessment: Report given to RN and Post -op Vital signs reviewed and stable  Post vital signs: Reviewed and stable  Last Vitals:  Vitals Value Taken Time  BP    Temp    Pulse    Resp    SpO2      Last Pain:  Vitals:   10/07/21 1214  TempSrc: Temporal  PainSc: 0-No pain         Complications: No notable events documented.

## 2021-10-07 NOTE — Interval H&P Note (Signed)
History and Physical Interval Note:  10/07/2021 12:16 PM  Jordan [name].  has presented today for surgery, with the diagnosis of AFIB.  The various methods of treatment have been discussed with the patient and family. After consideration of risks, benefits and other options for treatment, the patient has consented to  Procedure(s): CARDIOVERSION (N/A) as a surgical intervention.  The patient's history has been reviewed, patient examined, no change in status, stable for surgery.  I have reviewed the patient's chart and labs.  Questions were answered to the patient's satisfaction.     Pixie Casino

## 2021-10-07 NOTE — CV Procedure (Signed)
   CARDIOVERSION NOTE  Procedure: Electrical Cardioversion Indications:  Atrial Fibrillation  Procedure Details:  Consent: Risks of procedure as well as the alternatives and risks of each were explained to the (patient/caregiver).  Consent for procedure obtained.  Time Out: Verified patient identification, verified procedure, site/side was marked, verified correct patient position, special equipment/implants available, medications/allergies/relevent history reviewed, required imaging and test results available.  Performed  Patient placed on cardiac monitor, pulse oximetry, supplemental oxygen as necessary.  Sedation given:  propofol per anesthesia Pacer pads placed anterior and posterior chest.  Cardioverted 1 time(s).  Cardioverted at 150J biphasic.  Impression: Findings: Post procedure EKG shows:  sinus bradycardia Complications: None Patient did tolerate procedure well.  Plan: Successful DCCV with a single 150J biphasic shock to sinus bradycardia.  Time Spent Directly with the Patient:  30 minutes   Pixie Casino, MD, [facility], [place] Director of the Advanced Lipid Disorders &  Cardiovascular Risk Reduction Clinic Diplomate of the American Board of Clinical Lipidology Attending Cardiologist  Direct Dial: [phone]  Fax: [phone]  Website:  [email or web address]  Nadean Corwin Levin Dagostino 10/07/2021, 1:22 PM

## 2021-10-07 NOTE — Anesthesia Procedure Notes (Signed)
Procedure Name: MAC Date/Time: 10/07/2021 1:20 PM Performed by: Imagene Riches, CRNA Pre-anesthesia Checklist: Patient identified, Emergency Drugs available, Suction available, Patient being monitored and Timeout performed Patient Re-evaluated:Patient Re-evaluated prior to induction Oxygen Delivery Method: Ambu bag Preoxygenation: Pre-oxygenation with 100% oxygen

## 2021-10-07 NOTE — Anesthesia Postprocedure Evaluation (Signed)
Anesthesia Post Note  Patient: Jordan [name].  Procedure(s) Performed: CARDIOVERSION     Patient location during evaluation: PACU Anesthesia Type: General Level of consciousness: awake and alert Pain management: pain level controlled Vital Signs Assessment: post-procedure vital signs reviewed and stable Respiratory status: spontaneous breathing, nonlabored ventilation, respiratory function stable and patient connected to nasal cannula oxygen Cardiovascular status: blood pressure returned to baseline and stable Postop Assessment: no apparent nausea or vomiting Anesthetic complications: no   No notable events documented.  Last Vitals:  Vitals:   10/07/21 1330 10/07/21 1340  BP:  (!) 127/97  Pulse: (!) 49 (!) 49  Resp: 19 20  Temp:    SpO2: 100% 100%    Last Pain:  Vitals:   10/07/21 1340  TempSrc:   PainSc: 0-No pain                 Effie Berkshire

## 2021-10-07 NOTE — Anesthesia Preprocedure Evaluation (Addendum)
Anesthesia Evaluation  Patient identified by MRN, date of birth, ID band Patient awake    Reviewed: Allergy & Precautions, NPO status , Patient's Chart, lab work & pertinent test results, reviewed documented beta blocker date and time   History of Anesthesia Complications Negative for: history of anesthetic complications  Airway Mallampati: II  TM Distance: >3 FB Neck ROM: Full    Dental  (+) Dental Advisory Given, Chipped, Poor Dentition   Pulmonary neg pulmonary ROS,    breath sounds clear to auscultation       Cardiovascular hypertension, Pt. on medications and Pt. on home beta blockers + dysrhythmias (on Eliquis) Atrial Fibrillation  Rhythm:Regular Rate:Normal  TTE 04/28/21: EF 45-50%, RWMAs present, RV mildly enlarged, severe RAE/LAE, borderline dilatation of ascending aorta measuring 69mm    Neuro/Psych negative neurological ROS  negative psych ROS   GI/Hepatic negative GI ROS, (+) Hepatitis -, C  Endo/Other  negative endocrine ROS  Renal/GU Renal disease (Cr 1.75)  negative genitourinary   Musculoskeletal negative musculoskeletal ROS (+)   Abdominal Normal abdominal exam  (+)   Peds  Hematology negative hematology ROS (+)   Anesthesia Other Findings Day of surgery medications reviewed with patient.  Reproductive/Obstetrics negative OB ROS                            Anesthesia Physical Anesthesia Plan  ASA: 3  Anesthesia Plan: General   Post-op Pain Management:    Induction: Intravenous  PONV Risk Score and Plan: Treatment may vary due to age or medical condition and Propofol infusion  Airway Management Planned: Mask  Additional Equipment: None  Intra-op Plan:   Post-operative Plan:   Informed Consent: I have reviewed the patients History and Physical, chart, labs and discussed the procedure including the risks, benefits and alternatives for the proposed anesthesia with  the patient or authorized representative who has indicated his/her understanding and acceptance.       Plan Discussed with: CRNA  Anesthesia Plan Comments:        Anesthesia Quick Evaluation

## 2021-10-08 LAB — HEPATITIS C RNA QUANTITATIVE
HCV Quantitative Log: <1.18 log IU/mL
HCV RNA, PCR, QN: <15 IU/mL

## 2021-10-09 ENCOUNTER — Telehealth: Payer: Self-pay | Admitting: Internal Medicine

## 2021-10-09 DIAGNOSIS — I10 Essential (primary) hypertension: Secondary | ICD-10-CM

## 2021-10-09 MED ORDER — BLOOD PRESSURE MONITOR KIT: PACK | 0 refills | Status: DC

## 2021-10-09 NOTE — Telephone Encounter (Signed)
Received staff message from MD that patient needs new BP cuff We do not have any in stock from patient care fund Consulted with Almedia Balls and she suggested a prescription for this as medicare typically will cover most, if not all of the cost.   Prescription printed for MD to sign.   Message sent to patient via MyChart about this

## 2021-10-11 ENCOUNTER — Encounter (HOSPITAL_COMMUNITY): Payer: Self-pay | Admitting: Internal Medicine

## 2021-10-14 ENCOUNTER — Other Ambulatory Visit (HOSPITAL_COMMUNITY): Payer: Self-pay

## 2021-10-14 ENCOUNTER — Telehealth: Payer: Self-pay

## 2021-10-14 NOTE — Telephone Encounter (Signed)
   [facility] Pre-operative Risk Assessment    Patient Name: Jordan [name].  DOB: 1946-09-19 MRN: [id number]  HEARTCARE STAFF:  - IMPORTANT!!!!!! Under Visit Info/Reason for Call, type in Other and utilize the format Clearance MM/DD/YY or Clearance TBD. Do not use dashes or single digits. - Please review there is not already an duplicate clearance open for this procedure. - If request is for dental extraction, please clarify the # of teeth to be extracted. - If the patient is currently at the dentist's office, call Pre-Op Callback Staff (MA/nurse) to input urgent request.  - If the patient is not currently in the dentist office, please route to the Pre-Op pool.  Request for surgical clearance:  What type of surgery is being performed? Cataract Extraction by PE, IOL-Right   When is this surgery scheduled? TBD   What type of clearance is required (medical clearance vs. Pharmacy clearance to hold med vs. Both)? Both   Are there any medications that need to be held prior to surgery and how long? Eliquis   Practice name and name of physician performing surgery? [facility], Dr. Marca Ancona. [place]   What is the office phone number? [phone]   7.   What is the office fax number? [phone]  8.   Anesthesia type (None, local, MAC, general) ? IV sedation    Jacqulynn Cadet 10/14/2021, 2:13 PM  _________________________________________________________________   (provider comments below)

## 2021-10-14 NOTE — Telephone Encounter (Addendum)
     Patient Name: Jordan [name].  DOB: 12-08-1946 MRN: [id number]  Primary Cardiologist: Pixie Casino, MD  Chart reviewed as part of pre-operative protocol coverage. Cataract extractions are recognized in guidelines as low risk surgeries that do not typically require specific preoperative testing or holding of blood thinner therapy. Therefore, given past medical history and time since last visit, based on ACC/AHA guidelines, Jordan [name]. would be at acceptable risk for the planned procedure without further cardiovascular testing.   I will route this recommendation to the requesting party via Epic fax function and remove from pre-op pool.  Please call with questions.  Deberah Pelton, NP 10/14/2021, 2:33 PM

## 2021-10-19 ENCOUNTER — Other Ambulatory Visit (HOSPITAL_COMMUNITY): Payer: Self-pay

## 2021-10-21 ENCOUNTER — Ambulatory Visit (INDEPENDENT_AMBULATORY_CARE_PROVIDER_SITE_OTHER): Payer: Medicare Other | Admitting: Physician Assistant

## 2021-10-21 ENCOUNTER — Encounter: Payer: Self-pay | Admitting: Physician Assistant

## 2021-10-21 ENCOUNTER — Telehealth: Payer: Self-pay

## 2021-10-21 ENCOUNTER — Other Ambulatory Visit: Payer: Self-pay

## 2021-10-21 VITALS — BP 152/102 | HR 80 | Ht 72.0 in | Wt 202.2 lb

## 2021-10-21 DIAGNOSIS — I10 Essential (primary) hypertension: Secondary | ICD-10-CM

## 2021-10-21 DIAGNOSIS — I4811 Longstanding persistent atrial fibrillation: Secondary | ICD-10-CM | POA: Diagnosis not present

## 2021-10-21 DIAGNOSIS — Z8673 Personal history of transient ischemic attack (TIA), and cerebral infarction without residual deficits: Secondary | ICD-10-CM

## 2021-10-21 MED ORDER — HYDRALAZINE HCL 100 MG PO TABS: 100.0000 mg | ORAL_TABLET | Freq: Three times a day (TID) | ORAL | 2 refills | Status: DC

## 2021-10-21 NOTE — Telephone Encounter (Signed)
[facility] Patient Advocate Encounter  Patient's medications have been couriered to [facility] from [facility] and will be picked up 10/22/21.  Jordan [name] , [facility] Patient [facility] for [facility] Phone: [phone] Fax:  [phone]

## 2021-10-21 NOTE — Patient Instructions (Signed)
Medication Instructions:  Increase Hydralazine to 100 Mg by mouth three times a day. *If you need a refill on your cardiac medications before your next appointment, please call your pharmacy*   Lab Work: None Ordered  Testing/Procedures: None Ordered   Follow-Up: At [facility], you and your health needs are our priority.  As part of our continuing mission to provide you with exceptional heart care, we have created designated Provider Care Teams.  These Care Teams include your primary Cardiologist (physician) and Advanced Practice Providers (APPs -  Physician Assistants and Nurse Practitioners) who all work together to provide you with the care you need, when you need it.  We recommend signing up for the patient portal called "MyChart".  Sign up information is provided on this After Visit Summary.  MyChart is used to connect with patients for Virtual Visits (Telemedicine).  Patients are able to view lab/test results, encounter notes, upcoming appointments, etc.  Non-urgent messages can be sent to your provider as well.   To learn more about what you can do with MyChart, go to [email or web address].    Your next appointment:   3 month(s)  The format for your next appointment:   In Person  Provider:   You may see Pixie Casino, MD or one of the following Advanced Practice Providers on your designated Care Team:   Almyra Deforest, PA-C Fabian Sharp, PA-C or  Roby Lofts, [place]

## 2021-10-21 NOTE — Progress Notes (Signed)
Cardiology Office Note:    Date:  10/23/2021   ID:  Jordan Kelch., DOB [birth year], MRN [id number]  PCP:  [name], MD   [facility] Providers Cardiologist:  Jordan Casino, MD     Referring MD: [name],*   Chief Complaint  Patient presents with   Follow-up    Seen for Dr. Debara Pickett    History of Present Illness:    Jordan Fanning. is a 75 y.o. male with a hx of longstanding persistent atrial fibrillation, DVT, HFrEF, CKD stage III, hypertension, prostate cancer s/p radical prostatectomy and history of CVA.  Echocardiogram obtained in January 2018 showed EF 45 to 50%, diffuse hypokinesis worse in the septum, moderate LVH, mild AI and mild MR, severe LAE.  Dr. Debara Pickett suspected the patient had been in persistent longstanding persistent A. fib since 2014 at the time.  The focus was placed on rate control management.  Repeat echocardiogram performed on 12/07/2018 showed EF 60 to 65%, mild AI, moderate LAE.  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  Echocardiogram obtained on 05/25/2021 showed EF 45 to 50%, mildly enlarged RV, severe left and right atrial enlargement, mild AI, mildly dilated ascending aorta measuring at 39 mm.  He was started on Eliquis at the time.  Patient was last seen by Laurann Montana on 09/29/2021 at which time, he was in atrial fibrillation with lightheadedness fatigue and fluid retention.  He converted to normal sinus rhythm after successful outpatient cardioversion on 10/07/2021.  Patient presents today along with his sister and brother-in-law.  He felt no difference after the cardioversion.  Unfortunately he has already flipped back into atrial fibrillation.  He has no cardiac awareness of atrial fibrillation.  He has chronic fatigue which is unchanged even despite recent cardioversion.  At this time, I recommended continue with rate control for likely permanent atrial fibrillation.  He has been compliant  with Eliquis therapy.  He has no problem affording the Eliquis.  Blood pressure has been elevated between 130-170s, I will increase hydralazine to 100 mg 3 times a day.  If blood pressure continues to be uncontrolled next time, can potentially switch to losartan to something more powerful such as olmesartan.  Otherwise, he is stable to follow-up with Dr. Debara Pickett in 3 months.   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 Risa Grill   TRANSTHORACIC ECHOCARDIOGRAM  05/2006   EF ~57%; LV systolic function normal; mild focal basal septal hypertrophy; AV thickness mildly increased; LA  mod-markedly dilate    Current Medications: Current Meds  Medication Sig   allopurinol (ZYLOPRIM) 100 MG tablet Take 1 tablet (100 mg total) by mouth daily.   apixaban (ELIQUIS) 5 MG TABS tablet Take 1 tablet (5 mg total) by mouth 2 (two) times daily.   atorvastatin (LIPITOR) 40 MG tablet Take 1 tablet (40 mg total) by mouth daily.   Blood Pressure Monitor KIT Use as directed by your health care provider   carvedilol (COREG) 25 MG tablet TAKE 1 TABLET(25 MG) BY MOUTH TWICE DAILY WITH A MEAL   furosemide (LASIX) 20 MG tablet  Take 20 mg by mouth daily as needed for edema.   losartan (COZAAR) 100 MG tablet Take 1 tablet (100 mg total) by mouth daily.   ofloxacin (OCUFLOX) 0.3 % ophthalmic solution Place 1 drop into the left eye daily.   prednisoLONE acetate (PRED FORTE) 1 % ophthalmic suspension Place 1 drop into the left eye daily.   Sofosbuvir-Velpatasvir (EPCLUSA) 400-100 MG TABS Take 1 tablet by mouth daily.   [DISCONTINUED] hydrALAZINE (APRESOLINE) 50 MG tablet Take 1 tablet (50 mg total) by mouth 3 (three) times daily.   [DISCONTINUED] Sofosbuvir-Velpatasvir (EPCLUSA) 400-100 MG TABS Take 1 tablet by mouth daily.     Allergies:   Patient has no known allergies.   Social History   Socioeconomic History   Marital status: Divorced    Spouse name: Not on file   Number of children: 1   Years of education: Not on file   Highest education level: Not on file  Occupational History   Occupation: retired    Fish farm manager: [employer]  Tobacco Use   Smoking status: Never   Smokeless tobacco: Never  Vaping Use   Vaping Use: Never used  Substance and Sexual Activity   Alcohol use: Yes    Alcohol/week: 3.0 standard drinks    Types: 3 Glasses of wine per week    Comment: occasional    Drug use: No   Sexual activity: Yes  Other Topics Concern   Not on file  Social History Narrative   Left Handed   Lives in a one story home   Drinks Caffeine - once a day    Social Determinants of Health   Financial Resource Strain: Not on file  Food Insecurity: Not on file  Transportation Needs: Not on file  Physical Activity: Not on file  Stress: Not on file  Social Connections: Not on file     Family History: The patient's family history includes Brain cancer in his mother; Colon cancer in his mother; Stroke in his father. There is no history of Rectal cancer, Stomach cancer, or Esophageal cancer.  ROS:   Please see the history of present illness.     All other systems reviewed and are  negative.  EKGs/Labs/Other Studies Reviewed:    The following studies were reviewed today:  Echo 04/28/2021  1. Atrial fibrillation present. Left ventricular ejection fraction, by  estimation, is 45 to 50%. The left ventricle has mildly decreased  function. The left ventricle demonstrates regional wall motion  abnormalities (see scoring diagram/findings for  description). Left ventricular diastolic parameters are indeterminate.   2. Right ventricular systolic function is normal. The right ventricular  size is mildly enlarged. There is normal pulmonary artery systolic  pressure.   3. Left atrial size was severely dilated.   4. Right atrial size was severely dilated.   5. The mitral valve is normal in structure. No evidence of mitral  valve  regurgitation. No evidence of mitral stenosis.   6. The aortic valve is tricuspid. Aortic valve regurgitation is mild.  Mild to moderate aortic valve sclerosis/calcification is present, without  any evidence of aortic stenosis.   7. There is borderline dilatation of the ascending aorta, measuring 39  mm.   8. The inferior vena cava is normal in size with greater than 50%  respiratory variability, suggesting right atrial pressure of 3 mmHg.  EKG:  EKG is ordered today.  The ekg ordered today demonstrates atrial fibrillation with PVCs  Recent Labs: 04/27/2021: B Natriuretic Peptide 446.7; Magnesium 2.0; TSH 2.994 08/04/2021: ALT 57; ALT 57 10/02/2021: BUN 22; Creatinine, Ser 1.75; Hemoglobin 16.8; Platelets 201; Potassium 4.0; Sodium 144  Recent Lipid Panel    Component Value Date/Time   CHOL 150 04/28/2021 0446   CHOL 157 05/03/2018 1017   TRIG 51 04/28/2021 0446   HDL 37 (L) 04/28/2021 0446   HDL 55 05/03/2018 1017   CHOLHDL 4.1 04/28/2021 0446   VLDL 10 04/28/2021 0446   LDLCALC 103 (H) 04/28/2021 0446   LDLCALC 88 05/03/2018 1017     Risk Assessment/Calculations:    CHA2DS2-VASc Score = 5   This indicates a 7.2% annual risk of  stroke. The patient's score is based upon: CHF History: 1 HTN History: 1 Diabetes History: 0 Stroke History: 2 Vascular Disease History: 0 Age Score: 1 Gender Score: 0         Physical Exam:    VS:  BP (!) 152/102   Pulse 80   Ht 6' (1.829 m)   Wt 202 lb 3.2 oz (91.7 kg)   SpO2 98%   BMI 27.42 kg/m     Wt Readings from Last 3 Encounters:  10/21/21 202 lb 3.2 oz (91.7 kg)  10/07/21 203 lb (92.1 kg)  09/29/21 205 lb (93 kg)     GEN:  Well nourished, well developed in no acute distress HEENT: Normal NECK: No JVD; No carotid bruits LYMPHATICS: No lymphadenopathy CARDIAC: Irregularly irregular, no murmurs, rubs, gallops RESPIRATORY:  Clear to auscultation without rales, wheezing or rhonchi  ABDOMEN: Soft, non-tender, non-distended MUSCULOSKELETAL:  No edema; No deformity  SKIN: Warm and dry NEUROLOGIC:  Alert and oriented x 3 PSYCHIATRIC:  Normal affect   ASSESSMENT:    1. Longstanding persistent atrial fibrillation ([place])   2. Essential hypertension   3. H/O: CVA (cerebrovascular accident)    PLAN:    In order of problems listed above:  Longstanding persistent atrial fibrillation: Recently underwent outpatient cardioversion, however has since went back into his atrial fibrillation.  Patient feels no difference while unit A. fib versus in sinus rhythm.  I recommended continue with rate controlled management.  He is not in permanent atrial fibrillation at this point.  Hypertension: Blood pressure elevated today.  I will increase hydralazine to 100 mg 3 times a day  History of CVA: Coumadin has been switched to Eliquis instead.        Medication Adjustments/Labs and Tests Ordered: Current medicines are reviewed at length with the patient today.  Concerns regarding medicines are outlined above.  Orders Placed This Encounter  Procedures   EKG 12-Lead   Meds ordered this encounter  Medications   hydrALAZINE (APRESOLINE) 100 MG tablet    Sig: Take 1 tablet  (100 mg total) by mouth 3 (three) times daily.    Dispense:  270 tablet    Refill:  2    Patient Instructions  Medication Instructions:  Increase Hydralazine  to 100 Mg by mouth three times a day. *If you need a refill on your cardiac medications before your next appointment, please call your pharmacy*   Lab Work: None Ordered  Testing/Procedures: None Ordered   Follow-Up: At [facility], you and your health needs are our priority.  As part of our continuing mission to provide you with exceptional heart care, we have created designated Provider Care Teams.  These Care Teams include your primary Cardiologist (physician) and Advanced Practice Providers (APPs -  Physician Assistants and Nurse Practitioners) who all work together to provide you with the care you need, when you need it.  We recommend signing up for the patient portal called "MyChart".  Sign up information is provided on this After Visit Summary.  MyChart is used to connect with patients for Virtual Visits (Telemedicine).  Patients are able to view lab/test results, encounter notes, upcoming appointments, etc.  Non-urgent messages can be sent to your provider as well.   To learn more about what you can do with MyChart, go to [email or web address].    Your next appointment:   3 month(s)  The format for your next appointment:   In Person  Provider:   You may see Jordan Casino, MD or one of the following Advanced Practice Providers on your designated Care Team:   Almyra Deforest, PA-C Fabian Sharp, PA-C or  Roby Lofts, PA-C       Signed, [place], [place]  10/23/2021 11:35 PM    [place]

## 2021-10-23 ENCOUNTER — Encounter: Payer: Self-pay | Admitting: Physician Assistant

## 2021-10-28 ENCOUNTER — Telehealth: Payer: Self-pay

## 2021-10-28 NOTE — Telephone Encounter (Signed)
Called patient to inform him that medication is ready for pickup at the front desk. Will stop by later today to pickup. Leatrice Jewels, RMA

## 2021-11-03 ENCOUNTER — Other Ambulatory Visit (HOSPITAL_COMMUNITY): Payer: Self-pay

## 2021-11-10 ENCOUNTER — Other Ambulatory Visit: Payer: Self-pay

## 2021-11-10 ENCOUNTER — Ambulatory Visit (INDEPENDENT_AMBULATORY_CARE_PROVIDER_SITE_OTHER): Payer: Medicare Other | Admitting: Internal Medicine

## 2021-11-10 ENCOUNTER — Encounter: Payer: Self-pay | Admitting: Internal Medicine

## 2021-11-10 DIAGNOSIS — B182 Chronic viral hepatitis C: Secondary | ICD-10-CM

## 2021-11-10 NOTE — Assessment & Plan Note (Signed)
He has had an excellent response to treatment with Epclusa.  He will get repeat lab work today and follow-up at the end of therapy in 1 month.

## 2021-11-10 NOTE — Progress Notes (Signed)
[name]  Patient Active Problem List   Diagnosis Date Noted   Chronic hepatitis C without hepatic coma ([place])     Priority: High   Stage 3a chronic kidney disease ([place])    History of gout    Substance abuse ([place])    Right thalamic infarction ([place]) 04/27/2021   Personal history of colonic polyps 01/16/2019   Family history of colon cancer in mother 01/16/2019   Screening for lipid disorders 05/03/2018   NICM (nonischemic cardiomyopathy) ([place]) 67/89/3810   Chronic systolic heart failure ([place]) 02/15/2017   Essential hypertension 01/07/2017   Persistent atrial fibrillation ([place]) 03/15/2013   Long term (current) use of anticoagulants 03/15/2013   History of prostate cancer 02/16/2013    Patient's Medications  New Prescriptions   No medications on file  Previous Medications   ALLOPURINOL (ZYLOPRIM) 100 MG TABLET    Take 1 tablet (100 mg total) by mouth daily.   APIXABAN (ELIQUIS) 5 MG TABS TABLET    Take 1 tablet (5 mg total) by mouth 2 (two) times daily.   ATORVASTATIN (LIPITOR) 40 MG TABLET    Take 1 tablet (40 mg total) by mouth daily.   BLOOD PRESSURE MONITOR KIT    Use as directed by your health care provider   CARVEDILOL (COREG) 25 MG TABLET    TAKE 1 TABLET(25 MG) BY MOUTH TWICE DAILY WITH A MEAL   FUROSEMIDE (LASIX) 20 MG TABLET    Take 20 mg by mouth daily as needed for edema.   HYDRALAZINE (APRESOLINE) 100 MG TABLET    Take 1 tablet (100 mg total) by mouth 3 (three) times daily.   LOSARTAN (COZAAR) 100 MG TABLET    Take 1 tablet (100 mg total) by mouth daily.   OFLOXACIN (OCUFLOX) 0.3 % OPHTHALMIC SOLUTION    Place 1 drop into the left eye daily.   PREDNISOLONE ACETATE (PRED FORTE) 1 % OPHTHALMIC SUSPENSION    Place 1 drop into the left eye daily.   SOFOSBUVIR-VELPATASVIR (EPCLUSA) 400-100 MG TABS    Take 1 tablet by mouth daily.  Modified Medications   No medications on file  Discontinued Medications   No medications on file     Subjective: [facility] [name] is in for his routine follow-up visit.  He started on Epclusa on 09/02/2021 for his chronic hepatitis C.  He has had a few bouts of diarrhea over the last 2 months but otherwise feels like he is tolerating it well.  About 1 week ago he began to notice some mild dizziness when he first stands up.  He has not had any change in his fluid intake or appetite.  There has not been any change in his other medications.  Review of Systems: Review of Systems  Constitutional:  Negative for malaise/fatigue.  Gastrointestinal:  Positive for diarrhea. Negative for abdominal pain, nausea and vomiting.  Neurological:  Positive for dizziness.   Past Medical History:  Diagnosis Date   Atrial fib/flutter, transient    Hx   Atrial fibrillation ([place])    coumadin   Colon cancer (HCC)    family hx   Diverticulosis    Gout    Hemorrhoids    Hepatitis C    History of DVT (deep vein thrombosis)    History of nuclear stress test 04/2002   exercise; normal study    Hypertension    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[place])    history of    Personal history  of colonic polyps    Prostate cancer ([place])    radical prostatectomy     Social History   Tobacco Use   Smoking status: Never   Smokeless tobacco: Never  Vaping Use   Vaping Use: Never used  Substance Use Topics   Alcohol use: Not Currently    Alcohol/week: 3.0 standard drinks    Types: 3 Glasses of wine per week    Comment: occasional    Drug use: No    Family History  Problem Relation Age of Onset   Brain cancer Mother        brain tumor   Colon cancer Mother    Stroke Father    Rectal cancer Neg Hx    Stomach cancer Neg Hx    Esophageal cancer Neg Hx     No Known Allergies  Objective: Vitals:   11/10/21 1436  BP: (!) 146/101  Pulse: (!) 50  Temp: (!) 96.9 F (36.1 C)  TempSrc: Temporal  Weight: 202 lb (91.6 kg)   Body mass index is 27.4 kg/m.  Physical Exam  Lab Results  10/07/2021 Hepatitis C viral load less than 15   Problem List Items Addressed This Visit       High   Chronic hepatitis C without hepatic coma ([place])    He has had an excellent response to treatment with Epclusa.  He will get repeat lab work today and follow-up at the end of therapy in 1 month.      Relevant Orders   Hepatitis C RNA quantitative   Comprehensive metabolic panel     Michel Bickers, MD [facility] for [place] [phone] pager   [phone] cell 11/10/2021, 2:58 PM

## 2021-11-12 ENCOUNTER — Other Ambulatory Visit (HOSPITAL_COMMUNITY): Payer: Self-pay

## 2021-11-12 LAB — COMPREHENSIVE METABOLIC PANEL
AG Ratio: 1.2 (calc) (ref 1.0–2.5)
ALT: 25 U/L (ref 9–46)
AST: 28 U/L (ref 10–35)
Albumin: 3.7 g/dL (ref 3.6–5.1)
Alkaline phosphatase (APISO): 71 U/L (ref 35–144)
BUN/Creatinine Ratio: 10 (calc) (ref 6–22)
BUN: 17 mg/dL (ref 7–25)
CO2: 22 mmol/L (ref 20–32)
Calcium: 9.1 mg/dL (ref 8.6–10.3)
Chloride: 113 mmol/L — ABNORMAL HIGH (ref 98–110)
Creat: 1.73 mg/dL — ABNORMAL HIGH (ref 0.70–1.28)
Globulin: 3.1 g/dL (calc) (ref 1.9–3.7)
Glucose, Bld: 87 mg/dL (ref 65–99)
Potassium: 4.2 mmol/L (ref 3.5–5.3)
Sodium: 143 mmol/L (ref 135–146)
Total Bilirubin: 1.6 mg/dL — ABNORMAL HIGH (ref 0.2–1.2)
Total Protein: 6.8 g/dL (ref 6.1–8.1)

## 2021-11-12 LAB — HEPATITIS C RNA QUANTITATIVE
HCV Quantitative Log: <1.18 log IU/mL
HCV RNA, PCR, QN: <15 IU/mL

## 2021-11-17 ENCOUNTER — Other Ambulatory Visit (HOSPITAL_COMMUNITY): Payer: Self-pay

## 2021-11-23 ENCOUNTER — Ambulatory Visit: Payer: Medicare Other | Admitting: Physician Assistant

## 2021-12-08 ENCOUNTER — Ambulatory Visit (INDEPENDENT_AMBULATORY_CARE_PROVIDER_SITE_OTHER): Payer: Medicare Other | Admitting: Internal Medicine

## 2021-12-08 ENCOUNTER — Encounter: Payer: Self-pay | Admitting: Internal Medicine

## 2021-12-08 ENCOUNTER — Other Ambulatory Visit: Payer: Self-pay

## 2021-12-08 DIAGNOSIS — B182 Chronic viral hepatitis C: Secondary | ICD-10-CM

## 2021-12-08 NOTE — Assessment & Plan Note (Signed)
He has now completed therapy for hepatitis C and has had an excellent response with complete viral suppression.  He will get repeat blood work today and follow-up for test of cure in 3 months.

## 2021-12-08 NOTE — Progress Notes (Signed)
[place] for Infectious Disease  Patient Active Problem List   Diagnosis Date Noted   Chronic hepatitis C without hepatic coma ([place])     Priority: High   Stage 3a chronic kidney disease ([place])    History of gout    Substance abuse ([place])    Right thalamic infarction ([place]) 04/27/2021   Personal history of colonic polyps 01/16/2019   Family history of colon cancer in mother 01/16/2019   Screening for lipid disorders 05/03/2018   NICM (nonischemic cardiomyopathy) ([place]) 53/64/6803   Chronic systolic heart failure ([place]) 02/15/2017   Essential hypertension 01/07/2017   Persistent atrial fibrillation ([place]) 03/15/2013   Long term (current) use of anticoagulants 03/15/2013   History of prostate cancer 02/16/2013    Patient's Medications  New Prescriptions   No medications on file  Previous Medications   ALLOPURINOL (ZYLOPRIM) 100 MG TABLET    Take 1 tablet (100 mg total) by mouth daily.   APIXABAN (ELIQUIS) 5 MG TABS TABLET    Take 1 tablet (5 mg total) by mouth 2 (two) times daily.   ATORVASTATIN (LIPITOR) 40 MG TABLET    Take 1 tablet (40 mg total) by mouth daily.   BLOOD PRESSURE MONITOR KIT    Use as directed by your health care provider   CARVEDILOL (COREG) 25 MG TABLET    TAKE 1 TABLET(25 MG) BY MOUTH TWICE DAILY WITH A MEAL   FUROSEMIDE (LASIX) 20 MG TABLET    Take 20 mg by mouth daily as needed for edema.   HYDRALAZINE (APRESOLINE) 100 MG TABLET    Take 1 tablet (100 mg total) by mouth 3 (three) times daily.   LOSARTAN (COZAAR) 100 MG TABLET    Take 1 tablet (100 mg total) by mouth daily.   OFLOXACIN (OCUFLOX) 0.3 % OPHTHALMIC SOLUTION    Place 1 drop into the left eye daily.   PREDNISOLONE ACETATE (PRED FORTE) 1 % OPHTHALMIC SUSPENSION    Place 1 drop into the left eye daily.   SOFOSBUVIR-VELPATASVIR (EPCLUSA) 400-100 MG TABS    Take 1 tablet by mouth daily.  Modified Medications   No medications on file  Discontinued Medications   No medications on file     Subjective: [name] is in for his routine follow-up visit.  He started on Epclusa on 09/02/2021 for his chronic hepatitis C. initially had some diarrhea but that resolved spontaneously and he has not had any problems tolerating it recently.  He took his last pill this morning.  He does not recall having missed any doses.  There has not been any change in his other medications.  Review of Systems: Review of Systems  Constitutional:  Negative for malaise/fatigue.  Gastrointestinal:  Negative for abdominal pain, diarrhea, nausea and vomiting.  Neurological:  Positive for dizziness.   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[place])    history of    Personal history of colonic polyps    Prostate cancer [facility])    radical prostatectomy     Social History  Tobacco Use   Smoking status: Never   Smokeless tobacco: Never  Vaping Use   Vaping Use: Never used  Substance Use Topics   Alcohol use: Not Currently    Alcohol/week: 3.0 standard drinks    Types: 3 Glasses of wine per week    Comment: occasional    Drug use: No    Family History  Problem Relation Age of Onset   Brain cancer Mother        brain tumor   Colon cancer Mother    Stroke Father    Rectal cancer Neg Hx    Stomach cancer Neg Hx    Esophageal cancer Neg Hx     No Known Allergies  Objective: Vitals:   12/08/21 1441  Pulse: (!) 103  Temp: (!) 97.3 F (36.3 C)  TempSrc: Oral  SpO2: 98%  Weight: 201 lb (91.2 kg)   Body mass index is 27.26 kg/m.  Physical Exam Cardiovascular:     Rate and Rhythm: Normal rate.  Pulmonary:     Effort: Pulmonary effort is normal.   Psychiatric:        Mood and Affect: Mood normal.    Lab Results 10/07/2021 Hepatitis C viral load less than 15   Problem List Items Addressed This Visit       High   Chronic hepatitis C without hepatic coma ([place])    He has now completed therapy for hepatitis C and has had an excellent response with complete viral suppression.  He will get repeat blood work today and follow-up for test of cure in 3 months.      Relevant Orders   Hepatitis C RNA quantitative   Hepatitis C RNA quantitative     Michel Bickers, MD [facility] for [place] [phone] pager   [phone] cell 12/08/2021, 3:00 PM

## 2021-12-10 LAB — HEPATITIS C RNA QUANTITATIVE
HCV Quantitative Log: <1.18 log IU/mL
HCV RNA, PCR, QN: <15 IU/mL

## 2021-12-21 NOTE — Progress Notes (Signed)
NEUROLOGY FOLLOW UP OFFICE NOTE  Jordan [name] [id number]  Assessment/Plan:   1.  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  Chronic atrial fibrillation 3.  Hypertension 4.  Hyperlipidemia   1.  Secondary stroke prevention as managed by PCP and cardiology: - Eliquis - atorvastatin 29m.  LDL goal less than 70 - Normotensive blood pressure - has been elevated - will start hydralazine as prescribed by cardiology and contact cardiologist if blood pressure does not improve (as directed by cardiology) 2.  Mediterranean diet 3.  Follow up with me as needed.  Subjective:  Jordan Goyeris a 75year old left-handed man with atrial fibrillation, HTN, and history of substance abuse and prostate cancer who follows up for right thalamic stroke.  He is accompanied by his wife who supplements history.    UPDATE: Current medications:  Eliquis, atorvastatin 465m losartan, Coreg, hydralazine  Gait has improved.  He graduated from walker to cane and is walking unassisted today.  No longer with double vision except he thinks things are a little "moving" when he is walking.  Blood pressure has been elevated. His cardiologist has increased hydralazine to three times daily but hasn't picked up the prescription yet   HISTORY: [facility] with generalized weakness, gait difficulty, confusion and intermittent double vision.  No tPA given as patient was outside therapeutic window.  CT head showed acute right thalamic infarct which was confirmed on MRI of brain with petechial hemorrhage.  CTA of head and neck showed right ICA siphon atherosclerosis but no large vessel occlusion or hemodynamically significant stenosis.  Carotid doppler showed no hemodynamically significant stenosis.  Echcocardiogram showed a fib with EF 45-50% akinetic basal inferior segment and hypokinetic mid  inferoseptal segment, mid inferior segment and basal inferoseptal segment and no IAS/PFO.  INR was found to be low at 1.5 despite taking warfarin.  LDL 103, Hgb A1c 5.6 and UDS negative.  He was on warfarin prior to admission.  Discharged on ASA 8120maily without anticoagulation (given hemorrhagic transformation) with plan to repeat CT head in 5-7 days.  Repeat CT head on 05/04/2021 was stable with no hemorrhage.  Started Eliquis (as it is easier to manage than warfarin) and discontinued ASA.  He was in rehab and now at home.    PAST MEDICAL HISTORY: Past Medical History:  Diagnosis Date   Atrial fib/flutter, transient    Hx   Atrial fibrillation ([facility]CC[facility]  coumadin   Colon cancer (HCC)    family hx   Diverticulosis    Gout    Hemorrhoids    Hepatitis C    History of DVT (deep vein thrombosis)    History of nuclear stress test 04/2002   exercise; normal study    Hypertension    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[place]  history of    Personal history of colonic polyps    Prostate cancer ([facility]  radical prostatectomy     MEDICATIONS: Current Outpatient Medications on File Prior to Visit  Medication Sig Dispense Refill   allopurinol (ZYLOPRIM) 100 MG tablet Take 1 tablet (100 mg total) by mouth daily. 30 tablet 0   apixaban (ELIQUIS) 5 MG TABS tablet Take 1 tablet (5 mg total) by mouth 2 (two) times daily. 60 tablet 0   atorvastatin (LIPITOR) 40 MG tablet Take 1 tablet (40 mg total) by mouth daily. 30 tablet  0   Blood Pressure Monitor KIT Use as directed by your health care provider 1 kit 0   carvedilol (COREG) 25 MG tablet TAKE 1 TABLET(25 MG) BY MOUTH TWICE DAILY WITH A MEAL 60 tablet 0   furosemide (LASIX) 20 MG tablet Take 20 mg by mouth daily as needed for edema.     hydrALAZINE (APRESOLINE) 100 MG tablet Take 1 tablet (100 mg total) by mouth 3 (three) times daily. 270 tablet 2   losartan (COZAAR) 100 MG tablet Take 1 tablet (100 mg total) by mouth daily. 30 tablet 0    ofloxacin (OCUFLOX) 0.3 % ophthalmic solution Place 1 drop into the left eye daily. (Patient not taking: Reported on 11/10/2021)     prednisoLONE acetate (PRED FORTE) 1 % ophthalmic suspension Place 1 drop into the left eye daily.     Sofosbuvir-Velpatasvir (EPCLUSA) 400-100 MG TABS Take 1 tablet by mouth daily. 30 tablet 2   No current facility-administered medications on file prior to visit.    ALLERGIES: No Known Allergies  FAMILY HISTORY: Family History  Problem Relation Age of Onset   Brain cancer Mother        brain tumor   Colon cancer Mother    Stroke Father    Rectal cancer Neg Hx    Stomach cancer Neg Hx    Esophageal cancer Neg Hx       Objective:  Blood pressure (!) 144/100, pulse 84, height 6' (1.829 m), weight 204 lb (92.5 kg), SpO2 100 %. General: No acute distress.  Patient appears well-groomed.   Head:  Normocephalic/atraumatic Eyes:  Fundi examined but not visualized Neck: supple, no paraspinal tenderness, full range of motion Heart:  Irregular rate and rhythm Lungs:  Clear to auscultation bilaterally Back: No paraspinal tenderness Neurological Exam: alert and oriented to person, place, and time.  Speech fluent and not dysarthric, language intact.  CN II-XII intact. Bulk and tone normal, muscle strength 5/5 throughout.  Sensation to light touch intact.  Deep tendon reflexes 2+ throughout, toes downgoing.  Finger to nose testing intact.  Mildly broad-based gait. Romberg negative.   Jordan Clines, DO  CC: Jordan Cha, MD

## 2021-12-22 ENCOUNTER — Other Ambulatory Visit: Payer: Self-pay

## 2021-12-22 ENCOUNTER — Ambulatory Visit (INDEPENDENT_AMBULATORY_CARE_PROVIDER_SITE_OTHER): Payer: Medicare Other | Admitting: Neurology

## 2021-12-22 VITALS — BP 144/100 | HR 84 | Ht 72.0 in | Wt 204.0 lb

## 2021-12-22 DIAGNOSIS — I10 Essential (primary) hypertension: Secondary | ICD-10-CM | POA: Diagnosis not present

## 2021-12-22 DIAGNOSIS — I4821 Permanent atrial fibrillation: Secondary | ICD-10-CM

## 2021-12-22 DIAGNOSIS — I6381 Other cerebral infarction due to occlusion or stenosis of small artery: Secondary | ICD-10-CM | POA: Diagnosis not present

## 2021-12-22 DIAGNOSIS — E785 Hyperlipidemia, unspecified: Secondary | ICD-10-CM | POA: Diagnosis not present

## 2021-12-22 NOTE — Patient Instructions (Signed)
Continue Eliquis Start the hydralazine three times daily as prescribed by cardiology. If blood pressure continues to be elevated, then contact them Atorvastatin Exercise if okay with cardiology Mediterranean diet (see below) Follow up as needed   Mediterranean Diet A Mediterranean diet refers to food and lifestyle choices that are based on the traditions of countries located on the [employer]. It focuses on eating more fruits, vegetables, whole grains, beans, nuts, seeds, and heart-healthy fats, and eating less dairy, meat, eggs, and processed foods with added sugar, salt, and fat. This way of eating has been shown to help prevent certain conditions and improve outcomes for people who have chronic diseases, like kidney disease and heart disease. What are tips for following this plan? Reading food labels Check the serving size of packaged foods. For foods such as rice and pasta, the serving size refers to the amount of cooked product, not dry. Check the total fat in packaged foods. Avoid foods that have saturated fat or trans fats. Check the ingredient list for added sugars, such as corn syrup. Shopping  Buy a variety of foods that offer a balanced diet, including: Fresh fruits and vegetables (produce). Grains, beans, nuts, and seeds. Some of these may be available in unpackaged forms or large amounts (in bulk). Fresh seafood. Poultry and eggs. Low-fat dairy products. Buy whole ingredients instead of prepackaged foods. Buy fresh fruits and vegetables in-season from local farmers markets. Buy plain frozen fruits and vegetables. If you do not have access to quality fresh seafood, buy precooked frozen shrimp or canned fish, such as tuna, salmon, or sardines. Stock your pantry so you always have certain foods on hand, such as olive oil, canned tuna, canned tomatoes, rice, pasta, and beans. Cooking Cook foods with extra-virgin olive oil instead of using butter or other vegetable  oils. Have meat as a side dish, and have vegetables or grains as your main dish. This means having meat in small portions or adding small amounts of meat to foods like pasta or stew. Use beans or vegetables instead of meat in common dishes like chili or lasagna. Experiment with different cooking methods. Try roasting, broiling, steaming, and sauting vegetables. Add frozen vegetables to soups, stews, pasta, or rice. Add nuts or seeds for added healthy fats and plant protein at each meal. You can add these to yogurt, salads, or vegetable dishes. Marinate fish or vegetables using olive oil, lemon juice, garlic, and fresh herbs. Meal planning Plan to eat one vegetarian meal one day each week. Try to work up to two vegetarian meals, if possible. Eat seafood two or more times a week. Have healthy snacks readily available, such as: Vegetable sticks with hummus. Greek yogurt. Fruit and nut trail mix. Eat balanced meals throughout the week. This includes: Fruit: 2-3 servings a day. Vegetables: 4-5 servings a day. Low-fat dairy: 2 servings a day. Fish, poultry, or lean meat: 1 serving a day. Beans and legumes: 2 or more servings a week. Nuts and seeds: 1-2 servings a day. Whole grains: 6-8 servings a day. Extra-virgin olive oil: 3-4 servings a day. Limit red meat and sweets to only a few servings a month. Lifestyle  Cook and eat meals together with your family, when possible. Drink enough fluid to keep your urine pale yellow. Be physically active every day. This includes: Aerobic exercise like running or swimming. Leisure activities like gardening, walking, or housework. Get 7-8 hours of sleep each night. If recommended by your health care provider, drink red wine in moderation.  This means 1 glass a day for nonpregnant women and 2 glasses a day for men. A glass of wine equals 5 oz (150 mL). What foods should I eat? Fruits Apples. Apricots. Avocado. Berries. Bananas. Cherries. Dates. Figs.  Grapes. Lemons. Melon. Oranges. Peaches. Plums. Pomegranate. Vegetables Artichokes. Beets. Broccoli. Cabbage. Carrots. Eggplant. Green beans. Chard. Kale. Spinach. Onions. Leeks. Peas. Squash. Tomatoes. Peppers. Radishes. Grains Whole-grain pasta. Brown rice. Bulgur wheat. Polenta. Couscous. Whole-wheat bread. [place]. Meats and other proteins Beans. Almonds. Sunflower seeds. Pine nuts. Peanuts. Wendell. Salmon. Scallops. Shrimp. Eva. Tilapia. Clams. Oysters. Eggs. Poultry without skin. Dairy Low-fat milk. Cheese. Greek yogurt. Fats and oils Extra-virgin olive oil. Avocado oil. Grapeseed oil. Beverages Water. Red wine. Herbal tea. Sweets and desserts Greek yogurt with honey. Baked apples. Poached pears. Trail mix. Seasonings and condiments Basil. Cilantro. Coriander. Cumin. Mint. Parsley. Sage. Rosemary. Tarragon. Garlic. Oregano. Thyme. Pepper. Balsamic vinegar. Tahini. Hummus. Tomato sauce. Olives. Mushrooms. The items listed above may not be a complete list of foods and beverages you can eat. Contact a dietitian for more information. What foods should I limit? This is a list of foods that should be eaten rarely or only on special occasions. Fruits Fruit canned in syrup. Vegetables Deep-fried potatoes (french fries). Grains Prepackaged pasta or rice dishes. Prepackaged cereal with added sugar. Prepackaged snacks with added sugar. Meats and other proteins Beef. Pork. Lamb. Poultry with skin. Hot dogs. Berniece Salines. Dairy Ice cream. Sour cream. Whole milk. Fats and oils Butter. Canola oil. Vegetable oil. Beef fat (tallow). Lard. Beverages Juice. Sugar-sweetened soft drinks. Beer. Liquor and spirits. Sweets and desserts Cookies. Cakes. Pies. Candy. Seasonings and condiments Mayonnaise. Pre-made sauces and marinades. The items listed above may not be a complete list of foods and beverages you should limit. Contact a dietitian for more information. Summary The Mediterranean diet  includes both food and lifestyle choices. Eat a variety of fresh fruits and vegetables, beans, nuts, seeds, and whole grains. Limit the amount of red meat and sweets that you eat. If recommended by your health care provider, drink red wine in moderation. This means 1 glass a day for nonpregnant women and 2 glasses a day for men. A glass of wine equals 5 oz (150 mL). This information is not intended to replace advice given to you by your health care provider. Make sure you discuss any questions you have with your health care provider. Document Revised: 01/18/2020 Document Reviewed: 11/15/2019 Elsevier Patient Education  2022 [employer].

## 2021-12-23 ENCOUNTER — Encounter: Payer: Self-pay | Admitting: Neurology

## 2022-01-20 ENCOUNTER — Encounter: Payer: Self-pay | Admitting: Physician Assistant

## 2022-01-20 ENCOUNTER — Ambulatory Visit: Payer: Medicare Other | Admitting: Physician Assistant

## 2022-01-20 ENCOUNTER — Other Ambulatory Visit: Payer: Self-pay

## 2022-01-20 VITALS — BP 136/78 | HR 126 | Ht 72.0 in | Wt 194.0 lb

## 2022-01-20 DIAGNOSIS — I10 Essential (primary) hypertension: Secondary | ICD-10-CM | POA: Diagnosis not present

## 2022-01-20 DIAGNOSIS — I4821 Permanent atrial fibrillation: Secondary | ICD-10-CM | POA: Diagnosis not present

## 2022-01-20 NOTE — Patient Instructions (Addendum)
Medication Instructions:  Your physician recommends that you continue on your current medications as directed. Please refer to the Current Medication list given to you today.   *If you need a refill on your cardiac medications before your next appointment, please call your pharmacy*   Lab Work: NONE ordered at this time of appointment   If you have labs (blood work) drawn today and your tests are completely normal, you will receive your results only by: [place] (if you have MyChart) OR A paper copy in the mail If you have any lab test that is abnormal or we need to change your treatment, we will call you to review the results.   Testing/Procedures: [place]n [place]- Long Term Monitor Instructions  Your physician has requested you wear a ZIO patch monitor for 3 days.  This is a single patch monitor. [employer] supplies one patch monitor per enrollment. Additional stickers are not available. Please do not apply patch if you will be having a Nuclear Stress Test,  Echocardiogram, Cardiac CT, MRI, or Chest Xray during the period you would be wearing the  monitor. The patch cannot be worn during these tests. You cannot remove and re-apply the  ZIO XT patch monitor.  Your ZIO patch monitor will be mailed 3 day USPS to your address on file. It may take 3-5 days  to receive your monitor after you have been enrolled.  Once you have received your monitor, please review the enclosed instructions. Your monitor  has already been registered assigning a specific monitor serial # to you.  Billing and Patient Assistance Program Information  We have supplied [employer] with any of your insurance information on file for billing purposes. [employer] offers a sliding scale Patient Assistance Program for patients that do not have  insurance, or whose insurance does not completely cover the cost of the ZIO monitor.  You must apply for the Patient Assistance Program to qualify for this discounted rate.  To apply,  please call [employer] at [phone], select option 4, select option 2, ask to apply for  Patient Assistance Program. Theodore Demark will ask your household income, and how many people  are in your household. They will quote your out-of-pocket cost based on that information.  [employer] will also be able to set up a 64-month, interest-free payment plan if needed.  Applying the monitor   Shave hair from upper left chest.  Hold abrader disc by orange tab. Rub abrader in 40 strokes over the upper left chest as  indicated in your monitor instructions.  Clean area with 4 enclosed alcohol pads. Let dry.  Apply patch as indicated in monitor instructions. Patch will be placed under collarbone on left  side of chest with arrow pointing upward.  Rub patch adhesive wings for 2 minutes. Remove white label marked "1". Remove the white  label marked "2". Rub patch adhesive wings for 2 additional minutes.  While looking in a mirror, press and release button in center of patch. A small green light will  flash 3-4 times. This will be your only indicator that the monitor has been turned on.  Do not shower for the first 24 hours. You may shower after the first 24 hours.  Press the button if you feel a symptom. You will hear a small click. Record Date, Time and  Symptom in the Patient Logbook.  When you are ready to remove the patch, follow instructions on the last 2 pages of Patient  Logbook. Stick patch monitor onto the last  page of Patient Logbook.  Place Patient Logbook in the blue and white box. Use locking tab on box and tape box closed  securely. The blue and white box has prepaid postage on it. Please place it in the mailbox as  soon as possible. Your physician should have your test results approximately 7 days after the  monitor has been mailed back to [facility].  Call Morgan at [phone] if you have questions regarding  your ZIO XT patch monitor. Call them immediately if you see  an orange light blinking on your  monitor.  If your monitor falls off in less than 4 days, contact our Monitor department at [phone].  If your monitor becomes loose or falls off after 4 days call [employer] at [phone] for  suggestions on securing your monitor    Follow-Up: At [facility], you and your health needs are our priority.  As part of our continuing mission to provide you with exceptional heart care, we have created designated Provider Care Teams.  These Care Teams include your primary Cardiologist (physician) and Advanced Practice Providers (APPs -  Physician Assistants and Nurse Practitioners) who all work together to provide you with the care you need, when you need it.  We recommend signing up for the patient portal called "MyChart".  Sign up information is provided on this After Visit Summary.  MyChart is used to connect with patients for Virtual Visits (Telemedicine).  Patients are able to view lab/test results, encounter notes, upcoming appointments, etc.  Non-urgent messages can be sent to your provider as well.   To learn more about what you can do with MyChart, go to [email or web address].    Your next appointment:   3 month(s)  The format for your next appointment:   In Person  Provider:   Pixie Casino, MD     Other Instructions None

## 2022-01-20 NOTE — Progress Notes (Signed)
Cardiology Office Note:    Date:  01/22/2022   ID:  [name], DOB 29-Jul-1946, MRN [id number]  PCP:  [name], MD   [facility] Providers Cardiologist:  Pixie Casino, MD     Referring MD: [name],*   Chief Complaint  Patient presents with   Follow-up    History of Present Illness:    Zailen Albarran. is a 76 y.o. male with a hx of longstanding persistent atrial fibrillation, DVT, HFrEF, CKD stage III, hypertension, prostate cancer s/p radical prostatectomy and history of CVA.  Echocardiogram obtained in January 2018 showed EF 45 to 50%, diffuse hypokinesis worse in the septum, moderate LVH, mild AI and mild MR, severe LAE.  Dr. Debara Pickett suspected the patient had been in persistent longstanding persistent A. fib since 2014 at the time. The focus was placed on rate control management.  Repeat echocardiogram performed on 12/07/2018 showed EF 60 to 65%, mild AI, moderate LAE.  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  Echocardiogram obtained on 05/25/2021 showed EF 45 to 50%, mildly enlarged RV, severe left and right atrial enlargement, mild AI, mildly dilated ascending aorta measuring at 39 mm.  He was started on Eliquis at the time.  Patient was seen by Laurann Montana on 09/29/2021 at which time, he was in atrial fibrillation with lightheadedness, fatigue and fluid retention.  He was set up for cardioversion and converted to normal sinus rhythm after successful outpatient cardioversion on 10/07/2021.  I last saw the patient on 10/21/2021, at which time he felt no difference after the cardioversion.  Unfortunately he was already back in atrial fibrillation by the time of the visit.  Otherwise he has no cardiac awareness of A. fib.  He had chronic fatigue which was unchanged even despite recent cardioversion.  Therefore I recommended continuing rate control strategy for permanent atrial fibrillation.  Blood pressure was  elevated, I increase hydralazine to 100 mg 3 times a day.  Patient presents today for follow-up.  Blood pressure is very well controlled on the current therapy.  Unfortunately on arrival, his heart rate was 126 bpm, after he rested his heart rate came down to 100 bpm.  When I listen to his heart, his heart rate was around 80 bpm.  I am still concerned that his heart rate goes up quite significantly even with minimal exertion.  He denies any chest pain or worsening dyspnea.  He has no cardiac awareness of atrial fibrillation.  I recommended at least 3-day Zio monitor to assess his daytime heart rate.  If heart rate is elevated, will refer this patient to A. fib clinic.  With his borderline low EF, Cardizem is not ideal, but may require Cardizem in the future if heart rate is still not very well controlled.  Alternative is to switch the carvedilol to metoprolol succinate.  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 Risa Grill   TRANSTHORACIC ECHOCARDIOGRAM  05/2006   EF ~93%; LV systolic function normal; mild focal basal septal hypertrophy; AV thickness mildly increased; LA mod-markedly dilate    Current  Medications: Current Meds  Medication Sig   allopurinol (ZYLOPRIM) 100 MG tablet Take 1 tablet (100 mg total) by mouth daily.   apixaban (ELIQUIS) 5 MG TABS tablet Take 1 tablet (5 mg total) by mouth 2 (two) times daily.   atorvastatin (LIPITOR) 40 MG tablet Take 1 tablet (40 mg total) by mouth daily.   Blood Pressure Monitor KIT Use as directed by your health care provider   carvedilol (COREG) 25 MG tablet TAKE 1 TABLET(25 MG) BY MOUTH TWICE DAILY WITH A MEAL   furosemide (LASIX) 20 MG tablet Take 20 mg by mouth daily as needed for edema.   hydrALAZINE (APRESOLINE) 100 MG tablet Take 1 tablet (100 mg total) by mouth 3 (three) times daily.   losartan (COZAAR) 100 MG tablet Take 1 tablet (100 mg total) by mouth daily.   Sofosbuvir-Velpatasvir (EPCLUSA) 400-100 MG TABS Take 1 tablet by mouth daily.     Allergies:   Patient has no known allergies.   Social History   Socioeconomic History   Marital status: Divorced    Spouse name: Not on file   Number of children: 1   Years of education: Not on file   Highest education level: Not on file  Occupational History   Occupation: retired    Fish farm manager: [employer]  Tobacco Use   Smoking status: Never   Smokeless tobacco: Never  Vaping Use   Vaping Use: Never used  Substance and Sexual Activity   Alcohol use: Not Currently    Alcohol/week: 3.0 standard drinks    Types: 3 Glasses of wine per week    Comment: occasional    Drug use: No   Sexual activity: Yes  Other Topics Concern   Not on file  Social History Narrative   Left Handed   Lives in a one story home   Drinks Caffeine - once a day    Social Determinants of Health   Financial Resource Strain: Not on file  Food Insecurity: Not on file  Transportation Needs: Not on file  Physical Activity: Not on file  Stress: Not on file  Social Connections: Not on file     Family History: The patient's family history includes Brain cancer in his mother; Colon cancer in his  mother; Stroke in his father. There is no history of Rectal cancer, Stomach cancer, or Esophageal cancer.  ROS:   Please see the history of present illness.     All other systems reviewed and are negative.  EKGs/Labs/Other Studies Reviewed:    The following studies were reviewed today:  Echo 04/28/2021  1. Atrial fibrillation present. Left ventricular ejection fraction, by  estimation, is 45 to 50%. The left ventricle has mildly decreased  function. The left ventricle demonstrates regional wall motion  abnormalities (see scoring diagram/findings for  description). Left ventricular diastolic parameters are indeterminate.   2. Right ventricular systolic function is normal. The  right ventricular  size is mildly enlarged. There is normal pulmonary artery systolic  pressure.   3. Left atrial size was severely dilated.   4. Right atrial size was severely dilated.   5. The mitral valve is normal in structure. No evidence of mitral valve  regurgitation. No evidence of mitral stenosis.   6. The aortic valve is tricuspid. Aortic valve regurgitation is mild.  Mild to moderate aortic valve sclerosis/calcification is present, without  any evidence of aortic stenosis.   7. There is borderline dilatation of the ascending aorta, measuring 39  mm.   8. The inferior vena cava is normal in size with greater than 50%  respiratory variability, suggesting right atrial pressure of 3 mmHg.  EKG:  EKG is ordered today.  The ekg ordered today demonstrates atrial fibrillation, heart rate 102  Recent Labs: 04/27/2021: B Natriuretic Peptide 446.7; Magnesium 2.0; TSH 2.994 10/02/2021: Hemoglobin 16.8; Platelets 201 11/10/2021: ALT 25; BUN 17; Creat 1.73; Potassium 4.2; Sodium 143  Recent Lipid Panel    Component Value Date/Time   CHOL 150 04/28/2021 0446   CHOL 157 05/03/2018 1017   TRIG 51 04/28/2021 0446   HDL 37 (L) 04/28/2021 0446   HDL 55 05/03/2018 1017   CHOLHDL 4.1 04/28/2021 0446   VLDL 10  04/28/2021 0446   LDLCALC 103 (H) 04/28/2021 0446   LDLCALC 88 05/03/2018 1017     Risk Assessment/Calculations:    CHA2DS2-VASc Score = 5   This indicates a 7.2% annual risk of stroke. The patient's score is based upon: CHF History: 1 HTN History: 1 Diabetes History: 0 Stroke History: 2 Vascular Disease History: 0 Age Score: 1 Gender Score: 0          Physical Exam:    VS:  BP 136/78    Pulse (!) 126    Ht 6' (1.829 m)    Wt 194 lb (88 kg)    SpO2 97%    BMI 26.31 kg/m     Wt Readings from Last 3 Encounters:  01/20/22 194 lb (88 kg)  12/23/21 204 lb (92.5 kg)  12/08/21 201 lb (91.2 kg)     GEN:  Well nourished, well developed in no acute distress HEENT: Normal NECK: No JVD; No carotid bruits LYMPHATICS: No lymphadenopathy CARDIAC: RRR, no murmurs, rubs, gallops RESPIRATORY:  Clear to auscultation without rales, wheezing or rhonchi  ABDOMEN: Soft, non-tender, non-distended MUSCULOSKELETAL:  No edema; No deformity  SKIN: Warm and dry NEUROLOGIC:  Alert and oriented x 3 PSYCHIATRIC:  Normal affect   ASSESSMENT:    1. Permanent atrial fibrillation ([place])   2. Primary hypertension    PLAN:    In order of problems listed above:  Permanent atrial fibrillation: He likely has been in atrial fibrillation since at least 2014.  Previous attempt at cardioverting the patient in October 2022 resulted in quick recurrence of A. fib by follow-up.  He is currently on the highest dose of carvedilol.  When sitting down, his heart rate seems to be in the 80-100 range, however when he get up to walk, heart rate does go up.  I recommended a 3-day heart monitor to look at his daytime heart rate.  If heart rate remains elevated, will refer the patient to A. fib clinic for further management.  I suspect patient will likely require diltiazem on top of the carvedilol or we can switch the carvedilol to metoprolol succinate.  Diltiazem is less ideal given his prior history of  cardiomyopathy  Hypertension:  Blood pressure stable on current therapy.        Medication Adjustments/Labs and Tests Ordered: Current medicines are reviewed at length with the patient today.  Concerns regarding medicines are outlined above.  Orders Placed This Encounter  Procedures   LONG TERM MONITOR (3-14 DAYS)   EKG 12-Lead   No orders of the defined types were placed in this encounter.   Patient Instructions  Medication Instructions:  Your physician recommends that you continue on your current medications as directed. Please refer to the Current Medication list given to you today.   *If you need a refill on your cardiac medications before your next appointment, please call your pharmacy*   Lab Work: NONE ordered at this time of appointment   If you have labs (blood work) drawn today and your tests are completely normal, you will receive your results only by: [place] (if you have MyChart) OR A paper copy in the mail If you have any lab test that is abnormal or we need to change your treatment, we will call you to review the results.   Testing/Procedures: [place]- Long Term Monitor Instructions  Your physician has requested you wear a ZIO patch monitor for 3 days.  This is a single patch monitor. [employer] supplies one patch monitor per enrollment. Additional stickers are not available. Please do not apply patch if you will be having a Nuclear Stress Test,  Echocardiogram, Cardiac CT, MRI, or Chest Xray during the period you would be wearing the  monitor. The patch cannot be worn during these tests. You cannot remove and re-apply the  ZIO XT patch monitor.  Your ZIO patch monitor will be mailed 3 day USPS to your address on file. It may take 3-5 days  to receive your monitor after you have been enrolled.  Once you have received your monitor, please review the enclosed instructions. Your monitor  has already been registered assigning a specific monitor serial # to  you.  Billing and Patient Assistance Program Information  We have supplied [employer] with any of your insurance information on file for billing purposes. [employer] offers a sliding scale Patient Assistance Program for patients that do not have  insurance, or whose insurance does not completely cover the cost of the ZIO monitor.  You must apply for the Patient Assistance Program to qualify for this discounted rate.  To apply, please call [employer] at [phone], select option 4, select option 2, ask to apply for  Patient Assistance Program. Theodore Demark will ask your household income, and how many people  are in your household. They will quote your out-of-pocket cost based on that information.  [employer] will also be able to set up a 3-month interest-free payment plan if needed.  Applying the monitor   Shave hair from upper left chest.  Hold abrader disc by orange tab. Rub abrader in 40 strokes over the upper left chest as  indicated in your monitor instructions.  Clean area with 4 enclosed alcohol pads. Let dry.  Apply patch as indicated in monitor instructions. Patch will be placed under collarbone on left  side of chest with arrow pointing upward.  Rub patch adhesive wings for 2 minutes. Remove white label marked "1". Remove the white  label marked "2". Rub patch adhesive wings for 2 additional minutes.  While looking in a mirror, press and release button in center of patch. A small green light will  flash 3-4 times. This will be your only indicator that the monitor has  been turned on.  Do not shower for the first 24 hours. You may shower after the first 24 hours.  Press the button if you feel a symptom. You will hear a small click. Record Date, Time and  Symptom in the Patient Logbook.  When you are ready to remove the patch, follow instructions on the last 2 pages of Patient  Logbook. Stick patch monitor onto the last page of Patient Logbook.  Place Patient Logbook in the blue and white box.  Use locking tab on box and tape box closed  securely. The blue and white box has prepaid postage on it. Please place it in the mailbox as  soon as possible. Your physician should have your test results approximately 7 days after the  monitor has been mailed back to [facility].  Call [place] at [phone] if you have questions regarding  your ZIO XT patch monitor. Call them immediately if you see an orange light blinking on your  monitor.  If your monitor falls off in less than 4 days, contact our Monitor department at [phone].  If your monitor becomes loose or falls off after 4 days call [employer] at [phone] for  suggestions on securing your monitor    Follow-Up: At [facility], you and your health needs are our priority.  As part of our continuing mission to provide you with exceptional heart care, we have created designated Provider Care Teams.  These Care Teams include your primary Cardiologist (physician) and Advanced Practice Providers (APPs -  Physician Assistants and Nurse Practitioners) who all work together to provide you with the care you need, when you need it.  We recommend signing up for the patient portal called "MyChart".  Sign up information is provided on this After Visit Summary.  MyChart is used to connect with patients for Virtual Visits (Telemedicine).  Patients are able to view lab/test results, encounter notes, upcoming appointments, etc.  Non-urgent messages can be sent to your provider as well.   To learn more about what you can do with MyChart, go to [email or web address].    Your next appointment:   3 month(s)  The format for your next appointment:   In Person  Provider:   Pixie Casino, MD     Other Instructions None    Signed, Almyra Deforest, [place]  01/22/2022 11:56 PM    [facility]

## 2022-01-21 ENCOUNTER — Ambulatory Visit (INDEPENDENT_AMBULATORY_CARE_PROVIDER_SITE_OTHER): Payer: Medicare Other

## 2022-01-21 DIAGNOSIS — I4821 Permanent atrial fibrillation: Secondary | ICD-10-CM

## 2022-01-21 NOTE — Progress Notes (Unsigned)
Enrolled patient for a 3 day Zio XT monitor to be mailed to patients home   [place] to read

## 2022-02-03 DIAGNOSIS — I4821 Permanent atrial fibrillation: Secondary | ICD-10-CM

## 2022-02-26 NOTE — Progress Notes (Signed)
Dr. Debara Pickett, please review, the purpose of this heart monitor was to look at his average HR during afib as his HR was >100 during office visit. It appears his average heart rate is ok, but I see the monitor mentioned several runs of NSVT and 1 episode of 3.2 seconds pause. Looking at the strip closer, the NSVT appears to be irregular, are they NSVT or could they be afib with aberrancy? Patient is already at the highest dose of coreg, given 3.2 sec pause, hesitant to switch to toprol XL. Not a ideal candidate for diltiazem given low EF. Any recommendations? Patient is currently scheduled to see you on 4/3, if truly has NSVT, will need a sooner follow up.

## 2022-03-08 ENCOUNTER — Other Ambulatory Visit: Payer: Self-pay

## 2022-03-08 ENCOUNTER — Encounter: Payer: Self-pay | Admitting: Gastroenterology

## 2022-03-08 DIAGNOSIS — I4729 Other ventricular tachycardia: Secondary | ICD-10-CM

## 2022-03-09 ENCOUNTER — Other Ambulatory Visit: Payer: Self-pay

## 2022-03-09 ENCOUNTER — Encounter: Payer: Self-pay | Admitting: Internal Medicine

## 2022-03-09 ENCOUNTER — Ambulatory Visit: Payer: Medicare Other | Admitting: Internal Medicine

## 2022-03-09 DIAGNOSIS — B182 Chronic viral hepatitis C: Secondary | ICD-10-CM

## 2022-03-09 NOTE — Assessment & Plan Note (Signed)
I anticipate that his hepatitis C has been cured.  He will get repeat testing today to prove that. ?

## 2022-03-09 NOTE — Progress Notes (Signed)
?  ? ? ? ? ?[facility] ? ?Patient Active Problem List  ? Diagnosis Date Noted  ? Chronic hepatitis C without hepatic coma (HCC)   ?  Priority: High  ? Stage 3a chronic kidney disease ([place])   ? History of gout   ? Substance abuse ([place])   ? Right thalamic infarction ([place]) 04/27/2021  ? Personal history of colonic polyps 01/16/2019  ? Family history of colon cancer in mother 01/16/2019  ? Screening for lipid disorders 05/03/2018  ? NICM (nonischemic cardiomyopathy) ([place]) 02/15/2017  ? Chronic systolic heart failure ([place]) 02/15/2017  ? Essential hypertension 01/07/2017  ? Persistent atrial fibrillation ([place]) 03/15/2013  ? Long term (current) use of anticoagulants 03/15/2013  ? History of prostate cancer 02/16/2013  ? ? ?Patient's Medications  ?New Prescriptions  ? No medications on file  ?Previous Medications  ? ALLOPURINOL (ZYLOPRIM) 100 MG TABLET    Take 1 tablet (100 mg total) by mouth daily.  ? APIXABAN (ELIQUIS) 5 MG TABS TABLET    Take 1 tablet (5 mg total) by mouth 2 (two) times daily.  ? ATORVASTATIN (LIPITOR) 40 MG TABLET    Take 1 tablet (40 mg total) by mouth daily.  ? BLOOD PRESSURE MONITOR KIT    Use as directed by your health care provider  ? CARVEDILOL (COREG) 25 MG TABLET    TAKE 1 TABLET(25 MG) BY MOUTH TWICE DAILY WITH A MEAL  ? FUROSEMIDE (LASIX) 20 MG TABLET    Take 20 mg by mouth daily as needed for edema.  ? HYDRALAZINE (APRESOLINE) 100 MG TABLET    Take 1 tablet (100 mg total) by mouth 3 (three) times daily.  ? LOSARTAN (COZAAR) 100 MG TABLET    Take 1 tablet (100 mg total) by mouth daily.  ? OFLOXACIN (OCUFLOX) 0.3 % OPHTHALMIC SOLUTION    Place 1 drop into the left eye daily.  ? PREDNISOLONE ACETATE (PRED FORTE) 1 % OPHTHALMIC SUSPENSION    Place 1 drop into the left eye daily.  ? SOFOSBUVIR-VELPATASVIR (EPCLUSA) 400-100 MG TABS    Take 1 tablet by mouth daily.  ?Modified Medications  ? No medications on file  ?Discontinued Medications  ? No medications on file   ? ? ?Subjective: ?Regie completed 3 months of Epclusa on 12/08/2021.  His initial hepatitis C viral load was 132,000 but it suppressed rapidly and remained undetectable throughout treatment.  He is in for test of cure today. ? ?Review of Systems: ?Review of Systems  ?Constitutional:  Negative for fever and weight loss.  ?Gastrointestinal:  Negative for abdominal pain, nausea and vomiting.  ? ?Past Medical History:  ?Diagnosis Date  ? Atrial fib/flutter, transient   ? Hx  ? Atrial fibrillation ([place])   ? coumadin  ? Colon cancer ([place])   ? family hx  ? Diverticulosis   ? Gout   ? Hemorrhoids   ? Hepatitis C   ? History of DVT (deep vein thrombosis)   ? History of nuclear stress test 04/2002  ? exercise; normal study   ? Hypertension   ? 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 history of   ? Personal history of colonic polyps   ? Prostate cancer ([place])   ? radical prostatectomy   ? ? ?Social History  ? ?Tobacco Use  ? Smoking status: Never  ? Smokeless tobacco: Never  ?Vaping Use  ? Vaping Use: Never used  ?Substance Use Topics  ? Alcohol use: Not Currently  ?  Alcohol/week: 3.0 standard drinks  ?  Types: 3 Glasses of wine per week  ?  Comment: occasional   ? Drug use: No  ? ? ?Family History  ?Problem Relation Age of Onset  ? Brain cancer Mother   ?     brain tumor  ? Colon cancer Mother   ? Stroke Father   ? Rectal cancer Neg Hx   ? Stomach cancer Neg Hx   ? Esophageal cancer Neg Hx   ? ? ?No Known Allergies ? ?Objective: ?Vitals:  ? 03/09/22 1440  ?Weight: 197 lb (89.4 kg)  ? ?Body mass index is 26.72 kg/m?. ? ?Physical Exam ?Constitutional:   ?   Comments: He is in good spirits.  ?Cardiovascular:  ?   Rate and Rhythm: Normal rate.  ?Pulmonary:  ?   Effort: Pulmonary effort is normal.  ?Psychiatric:     ?   Mood and Affect: Mood normal.  ? ? ?Lab Results ? ?  ?Problem List Items Addressed This Visit   ? ?  ? High  ? Chronic hepatitis C without hepatic coma (HCC)  ?  I anticipate that his hepatitis C has  been cured.  He will get repeat testing today to prove that. ?  ?  ? Relevant Orders  ? Hepatitis C RNA quantitative  ? ? ? ?Michel Bickers, MD ?[facility] ?[place] ?[phone] pager   [phone] cell ?03/09/2022, 2:54 PM ?

## 2022-03-12 LAB — HEPATITIS C RNA QUANTITATIVE
HCV Quantitative Log: <1.18 log IU/mL
HCV RNA, PCR, QN: <15 IU/mL

## 2022-03-29 ENCOUNTER — Encounter: Payer: Self-pay | Admitting: Internal Medicine

## 2022-03-29 ENCOUNTER — Ambulatory Visit: Payer: Medicare Other | Admitting: Internal Medicine

## 2022-03-29 VITALS — BP 148/88 | HR 79 | Ht 72.0 in | Wt 199.4 lb

## 2022-03-29 DIAGNOSIS — R0600 Dyspnea, unspecified: Secondary | ICD-10-CM

## 2022-03-29 DIAGNOSIS — Z7901 Long term (current) use of anticoagulants: Secondary | ICD-10-CM | POA: Diagnosis not present

## 2022-03-29 DIAGNOSIS — I4891 Unspecified atrial fibrillation: Secondary | ICD-10-CM | POA: Diagnosis not present

## 2022-03-29 DIAGNOSIS — I4729 Other ventricular tachycardia: Secondary | ICD-10-CM

## 2022-03-29 NOTE — Progress Notes (Signed)
? ? ?OFFICE NOTE ? ?Chief Complaint:  ?Follow-up afib, fatigue ? ?Primary Care Physician: ?[name], MD ? ?HPI:  ?Jordan [name]. is a 76 year old gentleman with history of paroxysmal A-fib and DVT in the past, as well as hypertension. He has been on Coumadin. He recently had some left lower extremity swelling and underwent Doppler ultrasound. This did not demonstrate any DVT; however, there was no evaluation for superficial reflux. 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 He also has paroxysmal A-fib with no real recurrence recently and he is chronically anticoagulated on Coumadin. His INR had been 3.6. Medication was held and then his INR was then subtherapeutic, restarted and now is at 3.6 today. Unfortunately, I think the dose of Coumadin he is on at 10 mg is just too much for him. He has a history of hypertension which has not been bothersome and he noted that his swelling has come down since he saw our PA about 6 or 7 days ago. He has as a primary care provider who drew some fluid off of his knee. He was fitted with compression stockings and reports marked improvement of his swelling. 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 His INR checked today shows excellent control on his current regimen which is actually a fairly high dose of warfarin at an INR of 2.5. ? ?I had the pleasure seeing Jordan [name] back today. He has not followed up since September 2014. His last INR check was September 2015, which is about 6 months ago. He says that with his work he is not able to get regular INR checks, but we discussed at length today the importance of monthly INR monitoring and the dangers of not following his INR closely. His INR was checked in the office and was elevated today at 3.7. He's had high levels in the past is well. We discussed options including switching him to  a NOAC or possibly arranging for him to get blood work near work and send those values in for [place] to adjust. He tells [place] that he is actually going to retire in May and we may be able to get him through until that time. He denies any chest pain or worsening shortness of breath. ? ?01/07/2017 ? ?Jordan [name] returns today for follow-up. His blood pressure is notably elevated 146/100. Heart rate is not controlled at 119. He has been somewhat compliant with his INRs however I did not see him since March 2016. He says that the cost is an issue with follow-up appointments. Despite his elevated heart rate with A. fib he is not had an echocardiogram that I can see in the recent past. He denies any chest pain but does get some shortness of breath with exertion. ? ?02/15/2017 ? ?Jordan [name] returns back today for follow-up. Heart rate is now better controlled at 56 however he remains in atrial fibrillation. He underwent an echocardiogram does show a mildly reduced LV EF of 45-50% and severe left atrial enlargement. I reviewed a number of his EKGs from the past indicating he's probably been in persistent or long-standing persistent atrial fibrillation since 2014. Based on this, and given his severe lower atrial enlargement, it is unlikely for [place] to reestablish sinus rhythm unless we use antiarrhythmic medication. He seems to be unaware of his A. fib and has NYHA class 1-2 heart failure symptoms. I would  recommend further medical therapy for congestive heart failure at this time. ? ?5/18/018 ? ?Jordan [name] returns for follow-up today. He is without complaints. He remains in A. fib and heart rate is elevated today 116. He says he's not very compliant with twice-daily carvedilol dosing but is willing to work on it. Blood pressure is also elevated today to recheck was not much improved. His INR was checked in warfarin was adjusted by pharmacy. She denies any chest pain or shortness of breath. ? ?05/03/2018 ? ?Jordan [name] is seen  today for annual follow-up.  He reports has been out of his warfarin for about 3 days.  He was up in [place] with an ailing family member.  He did not realize he could get a refill of his prescription there.  His INR was low today.  We will further adjust that.  He is also been out of some of his blood pressure medicines which she refilled and just recently started taking.  Blood pressure is notably elevated today 154/104.  He has rate controlled A. fib.  He was noted to have severe atrial enlargement which make it unlikely for him to establish sinus rhythm with cardioversion. ? ?06/13/2018 ? ?Jordan [name] returns today for follow-up.  I readjusted his medications a few months ago when he seen some improvement in his symptoms.  His INR was slightly off today and readjusted by her pharmacist Cyril Mourning.  Blood pressure is very difficult to auscultate.  Initially was 140/76 and we measured about 136/92.  Weight is down 3 pounds.  Again he seems to be tolerating medications.  He will need LV reassessment in 6 months. ? ?11/24/2020 ? ?Jordan [name] is seen today in follow-up.  I saw him last year via telemedicine visit.  Overall he seems to be doing well.  He is followed in our anticoagulation clinic.  He continues to want to stay on warfarin after offering to switch to a direct oral anticoagulant.  He is lost a little weight and made some dietary changes.  Recently had gout and was taken off of HCTZ and had an increase in his losartan.  He is also now on low-dose allopurinol.  Overall he is doing much better.  INRs have been a little elevated around 3.5.  Blood pressure was initially elevated today however came down to 128/82.  He denies any chest pain or worsening shortness of breath.  His last echo in 2019 showed normalization of LVEF up to 60 to 65%. ? ?03/29/2022 ? ?Jordan [name] is seen today for follow-up A-fib.  Its been actually several years since I saw him.  He has been previously followed by our advanced practice  providers.  Specifically back in May of last year he had an echocardiogram.  At the time he was found to be in atrial fibrillation and his LVEF had dropped to 45 to 50%.  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  He was then arranged for cardioversion with me which I performed in October 2022.  He did successfully convert to sinus rhythm however he was noted to have severe biatrial enlargement on echo and ultimately went back into A-fib.  Subsequently he was seen number of times by Almyra Deforest, PA-C and ultimately he has had some issues with ongoing fatigue and intermittent tachycardia.  It was thought this was possibly due to poor rate control because he is persistently in A-fib but however he underwent monitoring  which showed a wide-complex tachycardia that I personally reviewed and is concerning for ventricular tachycardia.  He was then scheduled to see me today.  He reports other than being persistently fatigued and getting shortness of breath, he denies any chest pain.  He says he can no longer ride his bicycle which he used to do for several 100 miles. ? ?PMHx:  ?Past Medical History:  ?Diagnosis Date  ? Atrial fib/flutter, transient   ? Hx  ? Atrial fibrillation ([place])   ? coumadin  ? Colon cancer ([place])   ? family hx  ? Diverticulosis   ? Gout   ? Hemorrhoids   ? Hepatitis C   ? History of DVT (deep vein thrombosis)   ? History of nuclear stress test 04/2002  ? exercise; normal study   ? Hypertension   ? 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 history of   ? Personal history of colonic polyps   ? Prostate cancer ([place])   ? radical prostatectomy   ? ? ?Past Surgical History:  ?Procedure Laterality Date  ? CARDIAC CATHETERIZATION  2007  ? no occlusive CAD  ? CARDIOVERSION N/A 10/07/2021  ? Procedure: CARDIOVERSION;  Surgeon: Pixie Casino, MD;  Location: [facility] ENDOSCOPY;  Service: Cardiovascular;  Laterality: N/A;  ?  COLONOSCOPY  [id number]  ? [name]  ? PROSTATECTOMY    ? PROSTATECTOMY  04-04  ? Dr. Risa Grill  ? TRANSTHORACIC ECHOCARDIOGRAM  05/2006  ? EF ~83%; LV systolic function normal; mild focal basal septal

## 2022-03-29 NOTE — Patient Instructions (Signed)
Medication Instructions:  ?Your physician recommends that you continue on your current medications as directed. Please refer to the Current Medication list given to you today. ? ?*If you need a refill on your cardiac medications before your next appointment, please call your pharmacy* ? ? ?Testing/Procedures: ?Dr. Debara Pickett has ordered a Lexiscan Myocardial Perfusion Imaging Study.  ?This is done at [facility] ?Address: [address] [employer] ?** please schedule before 4/17 visit with Dr. Lovena Le ? ?Please arrive 15 minutes prior to your appointment time for registration and insurance purposes. ?  ?The test will take approximately 3 to 4 hours to complete; you may bring reading material.  If someone comes with you to your appointment, they will need to remain in the main lobby due to limited space in the testing area. **If you are pregnant or breastfeeding, please notify the nuclear lab prior to your appointment** ?  ?How to prepare for your Myocardial Perfusion Test: ?Do not eat or drink 3 hours prior to your test, except you may have water. ?Do not consume products containing caffeine (regular or decaffeinated) 12 hours prior to your test. (ex: coffee, chocolate, sodas, tea). ?Do wear comfortable clothes (no dresses or overalls) and walking shoes, tennis shoes preferred (No heels or open toe shoes are allowed). ?Do NOT wear cologne, perfume, aftershave, or lotions (deodorant is allowed). ?If you use an inhaler, use it the AM of your test and bring it with you.  ?If you use a nebulizer, use it the AM of your test.  ?If these instructions are not followed, your test will have to be rescheduled. ? ? ? ?Follow-Up: ?At [facility], you and your health needs are our priority.  As part of our continuing mission to provide you with exceptional heart care, we have created designated Provider Care Teams.  These Care Teams include your primary Cardiologist (physician) and Advanced Practice  Providers (APPs -  Physician Assistants and Nurse Practitioners) who all work together to provide you with the care you need, when you need it. ? ?We recommend signing up for the patient portal called "MyChart".  Sign up information is provided on this After Visit Summary.  MyChart is used to connect with patients for Virtual Visits (Telemedicine).  Patients are able to view lab/test results, encounter notes, upcoming appointments, etc.  Non-urgent messages can be sent to your provider as well.   ?To learn more about what you can do with MyChart, go to [email or web address].   ? ?Your next appointment:   ?3 months with Dr. Debara Pickett  ? ?

## 2022-03-30 ENCOUNTER — Telehealth (HOSPITAL_COMMUNITY): Payer: Self-pay | Admitting: *Deleted

## 2022-03-30 NOTE — Telephone Encounter (Signed)
Patient given detailed instructions per Myocardial Perfusion Study Information Sheet for the test on 03/31/22 Patient notified to arrive 15 minutes early and that it is imperative to arrive on time for appointment to keep from having the test rescheduled. ? If you need to cancel or reschedule your appointment, please call the office within 24 hours of your appointment. . Patient verbalized understanding.Jordan [name] Jacqueline ? ? ?

## 2022-03-31 ENCOUNTER — Ambulatory Visit (HOSPITAL_COMMUNITY): Payer: Medicare Other | Attending: Internal Medicine

## 2022-03-31 DIAGNOSIS — I4891 Unspecified atrial fibrillation: Secondary | ICD-10-CM

## 2022-03-31 DIAGNOSIS — R0600 Dyspnea, unspecified: Secondary | ICD-10-CM

## 2022-03-31 DIAGNOSIS — I4729 Other ventricular tachycardia: Secondary | ICD-10-CM

## 2022-03-31 LAB — MYOCARDIAL PERFUSION IMAGING
LV dias vol: 124 mL (ref 62–150)
LV sys vol: 95 mL
Nuc Stress EF: 23 %
Peak HR: 109 {beats}/min
Rest HR: 85 {beats}/min
Rest Nuclear Isotope Dose: 10.9 mCi
SDS: 0
SRS: 0
SSS: 0
ST Depression (mm): 0 mm
Stress Nuclear Isotope Dose: 30.7 mCi
TID: 1.03

## 2022-03-31 MED ORDER — TECHNETIUM TC 99M TETROFOSMIN IV KIT
30.7000 | PACK | Freq: Once | INTRAVENOUS | Status: AC | PRN
  Administered 2022-03-31: 30.7 via INTRAVENOUS
  Filled 2022-03-31: qty 31

## 2022-03-31 MED ORDER — TECHNETIUM TC 99M TETROFOSMIN IV KIT
10.9000 | PACK | Freq: Once | INTRAVENOUS | Status: AC | PRN
  Administered 2022-03-31: 10.9 via INTRAVENOUS
  Filled 2022-03-31: qty 11

## 2022-03-31 MED ORDER — REGADENOSON 0.4 MG/5ML IV SOLN
0.4000 mg | Freq: Once | INTRAVENOUS | Status: AC
  Administered 2022-03-31: 0.4 mg via INTRAVENOUS

## 2022-04-01 ENCOUNTER — Other Ambulatory Visit: Payer: Self-pay | Admitting: *Deleted

## 2022-04-01 DIAGNOSIS — I519 Heart disease, unspecified: Secondary | ICD-10-CM

## 2022-04-06 ENCOUNTER — Ambulatory Visit (HOSPITAL_COMMUNITY): Payer: Medicare Other | Attending: Internal Medicine

## 2022-04-06 DIAGNOSIS — I519 Heart disease, unspecified: Secondary | ICD-10-CM | POA: Diagnosis not present

## 2022-04-06 DIAGNOSIS — I119 Hypertensive heart disease without heart failure: Secondary | ICD-10-CM | POA: Diagnosis not present

## 2022-04-06 DIAGNOSIS — R0602 Shortness of breath: Secondary | ICD-10-CM | POA: Insufficient documentation

## 2022-04-06 DIAGNOSIS — I4891 Unspecified atrial fibrillation: Secondary | ICD-10-CM | POA: Diagnosis not present

## 2022-04-06 DIAGNOSIS — I351 Nonrheumatic aortic (valve) insufficiency: Secondary | ICD-10-CM | POA: Diagnosis not present

## 2022-04-06 LAB — ECHOCARDIOGRAM LIMITED
Area-P 1/2: 5.39 cm2
P 1/2 time: 806 msec
S' Lateral: 3.6 cm

## 2022-04-12 ENCOUNTER — Ambulatory Visit: Payer: Medicare Other | Admitting: Internal Medicine

## 2022-04-12 ENCOUNTER — Encounter: Payer: Self-pay | Admitting: Internal Medicine

## 2022-04-12 VITALS — BP 188/130 | HR 74 | Ht 72.0 in | Wt 195.0 lb

## 2022-04-12 DIAGNOSIS — I4729 Other ventricular tachycardia: Secondary | ICD-10-CM | POA: Diagnosis not present

## 2022-04-12 DIAGNOSIS — I10 Essential (primary) hypertension: Secondary | ICD-10-CM

## 2022-04-12 DIAGNOSIS — I4819 Other persistent atrial fibrillation: Secondary | ICD-10-CM

## 2022-04-12 MED ORDER — FUROSEMIDE 20 MG PO TABS: 20.0000 mg | ORAL_TABLET | Freq: Every day | ORAL | 3 refills | Status: DC

## 2022-04-12 NOTE — Progress Notes (Signed)
? ? ? ? ?HPI ?Mr.  Jordan [name] is referred by Dr. Debara Pickett for evaluation of atrial fib. He is a pleasant 76 yo man with a h/o HTN, and atrial fib who has developed dyspnea over the last year since he was treated for prostate CA. The patient  has been chronically anti-coagulated for over 15 years. He has mild LV dysfunction by echo. It appears he has had persistent atrial fib for at least 10 years and a 2D echo demonstrated a LA dimnension of 6.1 cm. The patient does not have palpitations. He describes class 3 dyspnea which I suspect is multifactorial. He did not take his bp meds this morning.  ?No Known Allergies ? ? ?Current Outpatient Medications  ?Medication Sig Dispense Refill  ? allopurinol (ZYLOPRIM) 100 MG tablet Take 1 tablet (100 mg total) by mouth daily. 30 tablet 0  ? apixaban (ELIQUIS) 5 MG TABS tablet Take 1 tablet (5 mg total) by mouth 2 (two) times daily. 60 tablet 0  ? atorvastatin (LIPITOR) 40 MG tablet Take 1 tablet (40 mg total) by mouth daily. 30 tablet 0  ? Blood Pressure Monitor KIT Use as directed by your health care provider 1 kit 0  ? carvedilol (COREG) 25 MG tablet TAKE 1 TABLET(25 MG) BY MOUTH TWICE DAILY WITH A MEAL 60 tablet 0  ? FARXIGA 10 MG TABS tablet Take 10 mg by mouth daily.    ? hydrALAZINE (APRESOLINE) 100 MG tablet Take 1 tablet (100 mg total) by mouth 3 (three) times daily. 270 tablet 2  ? losartan (COZAAR) 100 MG tablet Take 1 tablet (100 mg total) by mouth daily. 30 tablet 0  ? furosemide (LASIX) 20 MG tablet Take 20 mg by mouth daily as needed for edema. (Patient not taking: Reported on 04/12/2022)    ? ?No current facility-administered medications for this visit.  ? ? ? ?Past Medical History:  ?Diagnosis Date  ? Atrial fib/flutter, transient   ? Hx  ? Atrial fibrillation ([place])   ? coumadin  ? Colon cancer ([place])   ? family hx  ? Diverticulosis   ? Gout   ? Hemorrhoids   ? Hepatitis C   ? History of DVT (deep vein thrombosis)   ? History of nuclear stress test 04/2002  ? exercise;  normal study   ? Hypertension   ? 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 history of   ? Personal history of colonic polyps   ? Prostate cancer ([place])   ? radical prostatectomy   ? ? ?ROS: ? ? All systems reviewed and negative except as noted in the HPI. ? ? ?Past Surgical History:  ?Procedure Laterality Date  ? CARDIAC CATHETERIZATION  2007  ? no occlusive CAD  ? CARDIOVERSION N/A 10/07/2021  ? Procedure: CARDIOVERSION;  Surgeon: Pixie Casino, MD;  Location[facility] ENDOSCOPY;  Service: Cardiovascular;  Laterality: N/A;  ? COLONOSCOPY  [phone]  ? [name]  ? PROSTATECTOMY    ? PROSTATECTOMY  04-04  ? Dr. Risa Grill  ? TRANSTHORACIC ECHOCARDIOGRAM  05/2006  ? EF ~19%; LV systolic function normal; mild focal basal septal hypertrophy; AV thickness mildly increased; LA mod-markedly dilate  ? ? ? ?Family History  ?Problem Relation Age of Onset  ? Brain cancer Mother   ?     brain tumor  ? Colon cancer Mother   ? Stroke Father   ? Rectal cancer Neg Hx   ? Stomach cancer Neg Hx   ? Esophageal cancer Neg Hx   ? ? ? ?  Social History  ? ?Socioeconomic History  ? Marital status: Divorced  ?  Spouse name: Not on file  ? Number of children: 1  ? Years of education: Not on file  ? Highest education level: Not on file  ?Occupational History  ? Occupation: retired  ?  Employer: [employer]  ?Tobacco Use  ? Smoking status: Never  ? Smokeless tobacco: Never  ?Vaping Use  ? Vaping Use: Never used  ?Substance and Sexual Activity  ? Alcohol use: Not Currently  ?  Alcohol/week: 3.0 standard drinks  ?  Types: 3 Glasses of wine per week  ?  Comment: occasional   ? Drug use: No  ? Sexual activity: Yes  ?Other Topics Concern  ? Not on file  ?Social History Narrative  ? Left Handed  ? Lives in a one story home  ? Drinks Caffeine - once a day   ? ?Social Determinants of Health  ? ?Financial Resource Strain: Not on file  ?Food Insecurity: Not on file  ?Transportation Needs: Not on file  ?Physical Activity: Not on file   ?Stress: Not on file  ?Social Connections: Not on file  ?Intimate Partner Violence: Not on file  ? ? ? ?BP (!) 188/130   Pulse 74   Ht 6' (1.829 m)   Wt 195 lb (88.5 kg)   SpO2 100%   BMI 26.45 kg/m?  ? ?Physical Exam: ? ?Well appearing NAD ?HEENT: Unremarkable ?Neck:  No JVD, no thyromegally ?Lymphatics:  No adenopathy ?Back:  No CVA tenderness ?Lungs:  Clear with no wheezes ?HEART:  IRegular rate rhythm, 1/6 systolic murmurs, no rubs, no clicks ?Abd:  soft, positive bowel sounds, no organomegally, no rebound, no guarding ?Ext:  2 plus pulses, no edema, no cyanosis, no clubbing ?Skin:  No rashes no nodules ?Neuro:  CN II through XII intact, motor grossly intact ? ?EKG - reviewed. Atrial fib with a CVR ? ?Assess/Plan:  ?Atrial fib - his long standing atrial fib and severely enlarged LA make the likelihood of maintaining NSR almost zero and would require high dose amiodarone and atrial fib ablation. He does not feel palpitations. He has had atrial fib for a long time with minimal symptoms. Real question in my mind is whether his atrial fib is uncontrolled with activity leading to class 3 dyspnea. I have asked him to undergo cardiopulmonary stress test and emperic initiation of daily lasix. I'll see him back to review the results. If his rates aren't controlled then AV node ablation and PPM would be a consideration.  ?HTN - he did not take his medication today. I asked him to take his meds as soon as he got home.  ?Dyspnea - He will undergo CPX testing.  ? ?Carleene Overlie Donelda Mailhot,MD ?

## 2022-04-12 NOTE — Patient Instructions (Addendum)
Medication Instructions:  ?Your physician has recommended you make the following change in your medication:  ? ? START taking furosemide (Lasix) 20 mg-  Take one tablet by mouth daily ? ?Labwork: ?None ordered. ? ?Testing/Procedures: ?Your physician has recommended that you have a cardiopulmonary stress test (CPX). CPX testing is a non-invasive measurement of heart and lung function. It replaces a traditional treadmill stress test. This type of test provides a tremendous amount of information that relates not only to your present condition but also for future outcomes. This test combines measurements of you ventilation, respiratory gas exchange in the lungs, electrocardiogram (EKG), blood pressure and physical response before, during, and following an exercise protocol. ? ?Follow-Up: ?Your physician wants you to follow-up based on results of stress test. ? ?Any Other Special Instructions Will Be Listed Below (If Applicable). ? ?If you need a refill on your cardiac medications before your next appointment, please call your pharmacy.  ? ?Important Information About Sugar ? ? ? ? ? ? ?Cardiopulmonary Exercise Stress Test ?Cardiopulmonary exercise testing (CPET) is a test that is used to evaluate how well your heart and lungs are able to respond to exercise. This is called your exercise capacity. During this test, you will walk or run on a treadmill or pedal on a stationary bike while tests are done on your heart and lungs. ?This test may be done to check: ?Unexplained shortness of breath. ?Heart or lung problems. ?Exercise tolerance. This is done for people who have heart failure or coronary artery disease (CAD). ?Lung function. This is done for people who have chronic obstructive pulmonary disease (COPD), pulmonary hypertension, or cystic fibrosis. ?Heart function. This is useful for people who have heart disease or heart failure. ?Response to a cardiac or pulmonary rehabilitation program. ?Tell a health care provider  about: ?Any allergies you have. ?All medicines you are taking, including vitamins, herbs, eye drops, creams, and over-the-counter medicines. ?Any bleeding problems you have. ?Any surgeries you have had, especially if you have an implantable cardioverter defibrillator (ICD) or pacemaker. ?Any medical conditions you have. ?Whether you are pregnant or may be pregnant. ?What are the risks? ?Generally, this is a safe test. However, problems may occur, including: ?Chest pain. ?Shortness of breath. ?Dizziness. ?Leg pain. ?Irregular heartbeat. ?What happens before the test? ?Tell your health care provider about all the medicines you are taking. You may be told to change or stop some of your medicines. ?If you have diabetes, ask how you should take your insulin or diabetes pills. ?Follow instructions from your health care provider about eating or drinking restrictions. ?Wear loose-fitting clothing and comfortable shoes. ?If you use an inhaler, bring it with you to the test. ?What happens during the test? ? ?A blood pressure cuff will be placed on your arm. ?Several stick-on patches (electrodes) will be placed on your chest and attached to an electrocardiogram (EKG) machine. ?A clip-on monitor that measures the amount of oxygen in your blood will be placed on your finger (pulse oximeter). ?A clip will be placed on your nose and a mouthpiece will be placed in your mouth. This may be held in place with a headpiece. You will breathe through the mouthpiece during the test. ?You will be asked to start exercising either on a stationary bicycle or on a treadmill. ?You will be closely supervised during exercise. ?The amount of effort for your exercise will be gradually increased. ?During exercise, the test will measure: ?Your heart rate. ?Your heart rhythm. ?Your blood oxygen level. ?The  amount of oxygen you breathe in and the amount of carbon dioxide you breathe out through your mouthpiece. ?The test will end when: ?You have  finished the test. ?You have reached your maximum ability to exercise. ?You have chest or leg pain, dizziness, or shortness of breath. ?The test may also be stopped if you have abnormal changes in your blood pressure or develop an irregular heartbeat (arrhythmia). ?The procedure may vary among health care providers and hospitals. ?What can I expect after the test? ?Your blood pressure, heart rate, breathing rate, and blood oxygen level will be monitored until you leave the hospital or clinic. ?It is up to you to get the results of your test. Ask your health care provider, or the department that is doing the test, when your results will be ready. ?Follow any aftercare instructions given to you by the hospital or clinic. ?Summary ?Cardiopulmonary exercise testing (CPET) is a test that measures how well your heart and lungs are able to respond to exercise. ?Follow your health care provider's instructions about food and drink, and what medicines to change or stop. ?During this test, you will walk or run on a treadmill or pedal on a stationary bike while tests are done on your heart and lungs. ?The procedure may vary among health care providers and hospitals. ?This information is not intended to replace advice given to you by your health care provider. Make sure you discuss any questions you have with your health care provider. ?Document Revised: 10/27/2021 Document Reviewed: 10/27/2021 ?Elsevier Patient Education ? [place]. ? ?

## 2022-04-28 ENCOUNTER — Other Ambulatory Visit: Payer: Self-pay

## 2022-04-28 ENCOUNTER — Ambulatory Visit (HOSPITAL_COMMUNITY): Payer: Medicare Other | Attending: Internal Medicine

## 2022-04-28 ENCOUNTER — Emergency Department (HOSPITAL_COMMUNITY): Payer: Medicare Other

## 2022-04-28 ENCOUNTER — Encounter (HOSPITAL_COMMUNITY): Payer: Self-pay

## 2022-04-28 ENCOUNTER — Encounter (HOSPITAL_COMMUNITY): Payer: Self-pay | Admitting: *Deleted

## 2022-04-28 ENCOUNTER — Observation Stay (HOSPITAL_COMMUNITY)
Admission: EM | Admit: 2022-04-28 | Discharge: 2022-04-29 | Disposition: A | Discharge status: Home / Self Care | Payer: Medicare Other | Attending: Internal Medicine | Admitting: Internal Medicine

## 2022-04-28 DIAGNOSIS — R2689 Other abnormalities of gait and mobility: Secondary | ICD-10-CM | POA: Insufficient documentation

## 2022-04-28 DIAGNOSIS — Z85038 Personal history of other malignant neoplasm of large intestine: Secondary | ICD-10-CM | POA: Diagnosis not present

## 2022-04-28 DIAGNOSIS — Z8739 Personal history of other diseases of the musculoskeletal system and connective tissue: Secondary | ICD-10-CM | POA: Diagnosis not present

## 2022-04-28 DIAGNOSIS — R531 Weakness: Secondary | ICD-10-CM | POA: Diagnosis present

## 2022-04-28 DIAGNOSIS — Z20822 Contact with and (suspected) exposure to covid-19: Secondary | ICD-10-CM | POA: Diagnosis not present

## 2022-04-28 DIAGNOSIS — I130 Hypertensive heart and chronic kidney disease with heart failure and stage 1 through stage 4 chronic kidney disease, or unspecified chronic kidney disease: Secondary | ICD-10-CM | POA: Diagnosis not present

## 2022-04-28 DIAGNOSIS — I5022 Chronic systolic (congestive) heart failure: Secondary | ICD-10-CM

## 2022-04-28 DIAGNOSIS — Z7901 Long term (current) use of anticoagulants: Secondary | ICD-10-CM | POA: Diagnosis not present

## 2022-04-28 DIAGNOSIS — I4729 Other ventricular tachycardia: Secondary | ICD-10-CM

## 2022-04-28 DIAGNOSIS — I4819 Other persistent atrial fibrillation: Secondary | ICD-10-CM | POA: Diagnosis not present

## 2022-04-28 DIAGNOSIS — Z79899 Other long term (current) drug therapy: Secondary | ICD-10-CM | POA: Insufficient documentation

## 2022-04-28 DIAGNOSIS — I4891 Unspecified atrial fibrillation: Secondary | ICD-10-CM | POA: Insufficient documentation

## 2022-04-28 DIAGNOSIS — N183 Chronic kidney disease, stage 3 unspecified: Secondary | ICD-10-CM | POA: Diagnosis not present

## 2022-04-28 DIAGNOSIS — Z8546 Personal history of malignant neoplasm of prostate: Secondary | ICD-10-CM | POA: Insufficient documentation

## 2022-04-28 DIAGNOSIS — R0600 Dyspnea, unspecified: Secondary | ICD-10-CM | POA: Diagnosis not present

## 2022-04-28 DIAGNOSIS — I959 Hypotension, unspecified: Principal | ICD-10-CM

## 2022-04-28 LAB — CBC WITH DIFFERENTIAL/PLATELET
Abs Immature Granulocytes: 0.05 10*3/uL (ref 0.00–0.07)
Basophils Absolute: 0 10*3/uL (ref 0.0–0.1)
Basophils Relative: 0 %
Eosinophils Absolute: 0 10*3/uL (ref 0.0–0.5)
Eosinophils Relative: 0 %
HCT: 50 % (ref 39.0–52.0)
Hemoglobin: 17 g/dL (ref 13.0–17.0)
Immature Granulocytes: 1 %
Lymphocytes Relative: 18 %
Lymphs Abs: 1.3 10*3/uL (ref 0.7–4.0)
MCH: 30.4 pg (ref 26.0–34.0)
MCHC: 34 g/dL (ref 30.0–36.0)
MCV: 89.3 fL (ref 80.0–100.0)
Monocytes Absolute: 0.9 10*3/uL (ref 0.1–1.0)
Monocytes Relative: 13 %
Neutro Abs: 4.8 10*3/uL (ref 1.7–7.7)
Neutrophils Relative %: 68 %
Platelets: 256 10*3/uL (ref 150–400)
RBC: 5.6 MIL/uL (ref 4.22–5.81)
RDW: 14.3 % (ref 11.5–15.5)
WBC: 7.1 10*3/uL (ref 4.0–10.5)
nRBC: 0 % (ref 0.0–0.2)

## 2022-04-28 LAB — TROPONIN I (HIGH SENSITIVITY): Troponin I (High Sensitivity): 86 ng/L — ABNORMAL HIGH (ref <18)

## 2022-04-28 LAB — COMPREHENSIVE METABOLIC PANEL
ALT: 27 U/L (ref 0–44)
AST: 43 U/L — ABNORMAL HIGH (ref 15–41)
Albumin: 3.1 g/dL — ABNORMAL LOW (ref 3.5–5.0)
Alkaline Phosphatase: 72 U/L (ref 38–126)
Anion gap: 8 (ref 5–15)
BUN: 29 mg/dL — ABNORMAL HIGH (ref 8–23)
CO2: 19 mmol/L — ABNORMAL LOW (ref 22–32)
Calcium: 9.2 mg/dL (ref 8.9–10.3)
Chloride: 111 mmol/L (ref 98–111)
Creatinine, Ser: 1.78 mg/dL — ABNORMAL HIGH (ref 0.61–1.24)
GFR, Estimated: 39 mL/min — ABNORMAL LOW (ref >60)
Glucose, Bld: 161 mg/dL — ABNORMAL HIGH (ref 70–99)
Potassium: 4.4 mmol/L (ref 3.5–5.1)
Sodium: 138 mmol/L (ref 135–145)
Total Bilirubin: 1.5 mg/dL — ABNORMAL HIGH (ref 0.3–1.2)
Total Protein: 7.4 g/dL (ref 6.5–8.1)

## 2022-04-28 LAB — AMMONIA: Ammonia: 18 umol/L (ref 9–35)

## 2022-04-28 LAB — RESP PANEL BY RT-PCR (FLU A&B, COVID) ARPGX2
Influenza A by PCR: NEGATIVE
Influenza B by PCR: NEGATIVE
SARS Coronavirus 2 by RT PCR: NEGATIVE

## 2022-04-28 LAB — BRAIN NATRIURETIC PEPTIDE: B Natriuretic Peptide: 772.8 pg/mL — ABNORMAL HIGH (ref 0.0–100.0)

## 2022-04-28 LAB — MAGNESIUM: Magnesium: 2 mg/dL (ref 1.7–2.4)

## 2022-04-28 MED ORDER — ACETAMINOPHEN 650 MG RE SUPP: 650.0000 mg | Freq: Four times a day (QID) | RECTAL | Status: DC | PRN

## 2022-04-28 MED ORDER — CARVEDILOL 3.125 MG PO TABS: 25.0000 mg | ORAL_TABLET | Freq: Two times a day (BID) | ORAL | Status: DC

## 2022-04-28 MED ORDER — CARVEDILOL 3.125 MG PO TABS
12.5000 mg | ORAL_TABLET | Freq: Two times a day (BID) | ORAL | Status: DC
  Administered 2022-04-28 – 2022-04-29 (×2): 12.5 mg via ORAL
  Filled 2022-04-28 (×2): qty 4

## 2022-04-28 MED ORDER — ALLOPURINOL 100 MG PO TABS
100.0000 mg | ORAL_TABLET | Freq: Every day | ORAL | Status: DC
Start: 2022-04-29 — End: 2022-04-29
  Administered 2022-04-29: 100 mg via ORAL
  Filled 2022-04-28: qty 1

## 2022-04-28 MED ORDER — ALBUTEROL SULFATE (2.5 MG/3ML) 0.083% IN NEBU: 2.5000 mg | INHALATION_SOLUTION | Freq: Four times a day (QID) | RESPIRATORY_TRACT | Status: DC | PRN

## 2022-04-28 MED ORDER — DAPAGLIFLOZIN PROPANEDIOL 10 MG PO TABS: 10.0000 mg | ORAL_TABLET | Freq: Every day | ORAL | Status: DC

## 2022-04-28 MED ORDER — ACETAMINOPHEN 325 MG PO TABS: 650.0000 mg | ORAL_TABLET | Freq: Four times a day (QID) | ORAL | Status: DC | PRN

## 2022-04-28 MED ORDER — ATORVASTATIN CALCIUM 40 MG PO TABS
40.0000 mg | ORAL_TABLET | Freq: Every day | ORAL | Status: DC
  Administered 2022-04-29: 40 mg via ORAL
  Filled 2022-04-28: qty 1

## 2022-04-28 MED ORDER — SODIUM CHLORIDE 0.9 % IV BOLUS
1000.0000 mL | Freq: Once | INTRAVENOUS | Status: AC
  Administered 2022-04-28: 1000 mL via INTRAVENOUS

## 2022-04-28 MED ORDER — SODIUM CHLORIDE 0.9% FLUSH
3.0000 mL | Freq: Two times a day (BID) | INTRAVENOUS | Status: DC
  Administered 2022-04-28 (×2): 3 mL via INTRAVENOUS

## 2022-04-28 MED ORDER — APIXABAN 5 MG PO TABS
5.0000 mg | ORAL_TABLET | Freq: Two times a day (BID) | ORAL | Status: DC
  Administered 2022-04-28 – 2022-04-29 (×2): 5 mg via ORAL
  Filled 2022-04-28 (×2): qty 1

## 2022-04-28 NOTE — H&P (Addendum)
History and Physical    Patient: Jordan [name]. NFA:[id number] DOB: 1946-04-28 DOA: 04/28/2022 DOS: the patient was seen and examined on 04/28/2022 PCP: Lorenda Ishihara, MD  Patient coming from: Home  Chief Complaint: Tirednesss  HPI: Jordan [name]. is a 76 y.o. 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  However, reported complaints of tiredness and weakness was found to be hypotensive with MAP of 60. Patient denies ever losing consciousness.  He had noticed over the last week that it was more difficult for him to do his home chores than usual due to feeling more short of breath with activity.  Denies any significant leg swelling, calf pain, chest pain, nausea, vomiting, or diarrhea.  He had taken all of his morning medications.  He had last followed up with Dr. Ladona Ridgel on 4/17 and at that time was recommended to be on furosemide 20 mg daily.  Since starting Lasix on a daily basis he is down 10 pounds.  He most recently was started on Farxiga 3 to 4 days ago.  On admission into the emergency department patient was noted to be afebrile with vital signs stable.  Labs noted BUN 29, creatinine 1.78, AST 43, total bilirubin 1.6, and BNP 772.8.  Chest x-ray noted no acute abnormality.  Patient was given 1 L of normal saline IV fluids with improvement in blood pressures.  Cardiology was formally consulted.  TRH called to admit.  Review of Systems: As mentioned in the history of present illness. All other systems reviewed and are negative. Past Medical History:  Diagnosis Date   Atrial fib/flutter, transient    Hx   Atrial fibrillation (HCC)    coumadin   Colon cancer (HCC)    family hx   Diverticulosis    Gout    Hemorrhoids    Hepatitis C    History of DVT (deep vein thrombosis)     History of nuclear stress test 04/2002   exercise; normal study    Hypertension    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HCC)    history of    Personal history of colonic polyps    Prostate cancer [facility])    radical prostatectomy    Past Surgical History:  Procedure Laterality Date   CARDIAC CATHETERIZATION  2007   no occlusive CAD   CARDIOVERSION N/A 10/07/2021   Procedure: CARDIOVERSION;  Surgeon: Chrystie Nose, MD;  Location: [facility] ENDOSCOPY;  Service: Cardiovascular;  Laterality: N/A;   COLONOSCOPY  [phone]   Lavina Hamman   PROSTATECTOMY     PROSTATECTOMY  04-04   Dr. Isabel Caprice   TRANSTHORACIC ECHOCARDIOGRAM  05/2006   EF ~60%; LV systolic function normal; mild focal basal septal hypertrophy; AV thickness mildly increased; LA mod-markedly dilate   Social History:  reports that he has never smoked. He has never used smokeless tobacco. He reports that he does not currently use alcohol after a past usage of about 3.0 standard drinks per week. He reports that he does not use drugs.  No Known Allergies  Family History  Problem Relation Age of Onset   Brain cancer Mother        brain tumor   Colon cancer Mother    Stroke Father    Rectal cancer Neg Hx    Stomach cancer Neg Hx    Esophageal cancer Neg Hx  Prior to Admission medications   Medication Sig Start Date End Date Taking? Authorizing Provider  allopurinol (ZYLOPRIM) 100 MG tablet Take 1 tablet (100 mg total) by mouth daily. 05/18/21   Angiulli, Mcarthur Rossetti, PA-C  apixaban (ELIQUIS) 5 MG TABS tablet Take 1 tablet (5 mg total) by mouth 2 (two) times daily. 05/18/21   Angiulli, Mcarthur Rossetti, PA-C  atorvastatin (LIPITOR) 40 MG tablet Take 1 tablet (40 mg total) by mouth daily. 05/18/21   Angiulli, Mcarthur Rossetti, PA-C  Blood Pressure Monitor KIT Use as directed by your health care provider 10/09/21   Chrystie Nose, MD  carvedilol (COREG) 25 MG tablet TAKE 1 TABLET(25 MG) BY MOUTH TWICE DAILY WITH A MEAL 05/18/21   Angiulli,  Mcarthur Rossetti, PA-C  FARXIGA 10 MG TABS tablet Take 10 mg by mouth daily. 03/13/22   [provider]  furosemide (LASIX) 20 MG tablet Take 1 tablet (20 mg total) by mouth daily. 04/12/22 04/07/23  Marinus Maw, MD  hydrALAZINE (APRESOLINE) 100 MG tablet Take 1 tablet (100 mg total) by mouth 3 (three) times daily. 10/21/21 04/19/22  Azalee Course, PA  losartan (COZAAR) 100 MG tablet Take 1 tablet (100 mg total) by mouth daily. 05/18/21   Charlton Amor, PA-C    Physical Exam: Vitals:   04/28/22 1228 04/28/22 1253 04/28/22 1300  BP: 104/80  116/88  Pulse: 79  71  Resp: 17  (!) 23  Temp:  98.9 F (37.2 C)   TempSrc:  Oral   SpO2: 100%  95%   Exam  Constitutional: Elderly male currently in no acute distress Eyes: PERRL, lids and conjunctivae normal ENMT: Mucous membranes are dry. Posterior pharynx clear of any exudate or lesions.  Neck: normal, supple, no JVD Respiratory: clear to auscultation bilaterally, no wheezing, no crackles. Normal respiratory effort. No accessory muscle use.  Cardiovascular: Irregular heart rhythm. No extremity edema. 2+ pedal pulses.   Abdomen: no tenderness, no masses palpated. No hepatosplenomegaly. Bowel sounds positive.  Musculoskeletal: no clubbing / cyanosis. No joint deformity upper and lower extremities. Good ROM, no contractures. Normal muscle tone.  Skin: no rashes, lesions, ulcers. No induration Neurologic: CN 2-12 grossly intact.   Strength 5/5 in all 4.  Psychiatric: Normal judgment and insight. Alert and oriented x 3. Normal mood.     Data Reviewed:  EKG revealed atrial fibrillation at 79 bpm with PVC.  Assessment and Plan: Hypotension Acute.  Patient presents after being noted to hypotensive with MAP 60.  Patient had recently been recommended to start furosemide 20 mg daily on 4/17.  Since that time patient had lost approximately 10 pounds.  Had been given 1 L of IV fluids with improvement in blood pressures.  Home blood pressure regimen  included Coreg 25 mg twice daily with meals, furosemide 20 mg daily, losartan 100 mg daily, and hydralazine 100 mg 3 times daily.  Suspect patient may have been over diuresed as a cause of hypotension. -Admit to a telemetry bed -Check TSH -Held hydralazine, losartan, and furosemide -Resume Coreg as tolerated -PT to eval and treat -Appreciate cardiology consultative services, will follow-up for further recommendation  Dyspnea Patient had reported feeling more short of breath with activity which he had been recommended to take furosemide daily during last office visit with Dr. Ladona Ridgel on 4/17.  Patient has been taking the medication as advised.  Chest x-ray otherwise noted to be clear today.  O2 saturation currently maintained on room air.  No significant wheezes or rhonchi appreciated  on physical exam.  Unclear if symptoms are related to patient being in atrial fibrillation. -Continue to monitor  Systolic congestive heart failure Recent echocardiogram revealed EF of 40 to 45%.  BNP elevated at 772.8.  Patient currently does not appear to be fluid overloaded.  He has been given 1 L normal saline IV fluids in the ED. 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  Thought secondary to severely enlarged left atrium.  CHA2DS2-VASc-5. -Continue Eliquis  History of Gout -Continue allopurinol   Advance Care Planning:   Code Status: Full Code   Consults: cardiology  Family Communication: None requested  Severity of Illness: The appropriate patient status for this patient is OBSERVATION. Observation status is judged to be reasonable and necessary in order to provide the required intensity of service to ensure the patient's safety. The patient's presenting symptoms, physical exam findings, and initial radiographic and laboratory data in the context of their medical condition is felt to place them at  decreased risk for further clinical deterioration. Furthermore, it is anticipated that the patient will be medically stable for discharge from the hospital within 2 midnights of admission.   Author: Clydie Braun, MD 04/28/2022 2:21 PM  For on call review [email or web address].

## 2022-04-28 NOTE — ED Notes (Signed)
Graham crackers and water provided ?

## 2022-04-28 NOTE — ED Triage Notes (Signed)
Pt arrives from the [facility] where he went to have stress test but before the test, he started feeling weak and lethargic. Upon checking pt blood pressure, SBP 60. Pt brought to ED. Pt is AOx4 but complains of feeling tired.  ?

## 2022-04-28 NOTE — Consult Note (Addendum)
?Cardiology Consultation:  ? ?Patient ID: Jordan [name]. ?MRN: [id number]; DOB: [birth year] ? ?Admit date: 04/28/2022 ?Date of Consult: 04/28/2022 ? ?PCP:  [name], MD ?  ?[facility] Providers ?Cardiologist:  Pixie Casino, MD      ? ? ?Patient Profile:  ? ?Jordan [name]. is a 76 y.o. male with a hx of PAF, DVT, HTN, anticoagulation, EF 45-50, possible VT, dyspnea who is being seen 04/28/2022 for the evaluation of hypotension at the request of Dr. Tamala Julian. ? ?History of Present Illness:  ? ?Jordan [name] with normal CORs in 2007 at cath, persistent A fib with DCCV in 2022 that was successful.  Since he has mostly been in a flutter.  On recent monitor he has WCT and at least one pause of 3.2 sec.  He was seen by Dr. Lovena Le with EP .   Pt also with dyspnea.  On visit with Dr. Lovena Le pt had not taken his BP meds and BP 188/130.  Dr. Lovena Le ordered cardiopulmonary stress test and emperic initiation of daily lasix 20 mg.  Also question on recent echo if amyloid.  EF was 40-45%.  At that time Dr. Debara Pickett considered cMRI to eval.  ? ?Today was here for CPX complaining of sleepiness and weakness, he thought because he woke up early today.  Symptoms increased with pre-exercise spirometry and no exercise was done. Wt down 10 lbs from OV with addition of lasix.  MAP was 60.  Pt sent to ER.  ? ?In ER BP 116/88 afebrile and R 20-26 P 64 to 100.   ? ?Pt has been voiding more frequently. No chest pain and no lightheadedness  ?No hx of tendon issues ? ?Na 138, k+4.4 Cr 1.78 albumin 3.1 AST 43 Tbili 1.5 ?BNP 772.8 ?WBC 7.1 hgb 17 plts 256  ?Neg covid and fllu ?PCXR NAD ?   ?EKG:  The EKG was personally reviewed and demonstrates:  A flutter with rate 79 though pauses  ?Telemetry:  Telemetry was personally reviewed and demonstrates:  a flutter ? ?Past Medical History:  ?Diagnosis Date  ? Atrial fib/flutter, transient   ? Hx  ? Atrial fibrillation ([place])   ? coumadin  ? Colon cancer ([place])   ? family hx  ?  Diverticulosis   ? Gout   ? Hemorrhoids   ? Hepatitis C   ? History of DVT (deep vein thrombosis)   ? History of nuclear stress test 04/2002  ? exercise; normal study   ? Hypertension   ? 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[place])   ? history of   ? Personal history of colonic polyps   ? Prostate cancer ([place])   ? radical prostatectomy   ? ? ?Past Surgical History:  ?Procedure Laterality Date  ? CARDIAC CATHETERIZATION  2007  ? no occlusive CAD  ? CARDIOVERSION N/A 10/07/2021  ? Procedure: CARDIOVERSION;  Surgeon: Pixie Casino, MD;  Location: [facility] ENDOSCOPY;  Service: Cardiovascular;  Laterality: N/A;  ? COLONOSCOPY  [phone]  ? [name]  ? PROSTATECTOMY    ? PROSTATECTOMY  04-04  ? Dr. Risa Grill  ? TRANSTHORACIC ECHOCARDIOGRAM  05/2006  ? EF ~68%; LV systolic function normal; mild focal basal septal hypertrophy; AV thickness mildly increased; LA mod-markedly dilate  ?  ? ?Home Medications:  ?Prior to Admission medications   ?Medication Sig Start Date End Date Taking? Authorizing Provider  ?allopurinol (ZYLOPRIM) 100 MG tablet Take 1 tablet (100 mg total) by mouth daily. 05/18/21  Yes Angiulli, Lavon Paganini,  PA-C  ?apixaban (ELIQUIS) 5 MG TABS tablet Take 1 tablet (5 mg total) by mouth 2 (two) times daily. 05/18/21  Yes Angiulli, Lavon Paganini, PA-C  ?atorvastatin (LIPITOR) 40 MG tablet Take 1 tablet (40 mg total) by mouth daily. 05/18/21  Yes Angiulli, Lavon Paganini, PA-C  ?carvedilol (COREG) 25 MG tablet TAKE 1 TABLET(25 MG) BY MOUTH TWICE DAILY WITH A MEAL ?Patient taking differently: Take 25 mg by mouth 2 (two) times daily with a meal. 05/18/21  Yes Angiulli, Lavon Paganini, PA-C  ?FARXIGA 10 MG TABS tablet Take 10 mg by mouth daily. 03/13/22  Yes [provider]  ?furosemide (LASIX) 20 MG tablet Take 1 tablet (20 mg total) by mouth daily. 04/12/22 04/07/23 Yes Evans Lance, MD  ?hydrALAZINE (APRESOLINE) 100 MG tablet Take 1 tablet (100 mg total) by mouth 3 (three) times daily. 10/21/21 04/28/22 Yes Almyra Deforest, PA   ?losartan (COZAAR) 100 MG tablet Take 1 tablet (100 mg total) by mouth daily. 05/18/21  Yes Angiulli, Lavon Paganini, PA-C  ?Blood Pressure Monitor KIT Use as directed by your health care provider 10/09/21   Pixie Casino, MD  ? ? ?Inpatient Medications: ?Scheduled Meds: ? allopurinol  100 mg Oral Daily  ? apixaban  5 mg Oral BID  ? carvedilol  25 mg Oral BID WC  ? dapagliflozin propanediol  10 mg Oral Daily  ? sodium chloride flush  3 mL Intravenous Q12H  ? ?Continuous Infusions: ? ?PRN Meds: ?acetaminophen **OR** acetaminophen, albuterol ? ?Allergies:   No Known Allergies ? ?Social History:   ?Social History  ? ?Socioeconomic History  ? Marital status: Divorced  ?  Spouse name: Not on file  ? Number of children: 1  ? Years of education: Not on file  ? Highest education level: Not on file  ?Occupational History  ? Occupation: retired  ?  Employer: [employer]  ?Tobacco Use  ? Smoking status: Never  ? Smokeless tobacco: Never  ?Vaping Use  ? Vaping Use: Never used  ?Substance and Sexual Activity  ? Alcohol use: Not Currently  ?  Alcohol/week: 3.0 standard drinks  ?  Types: 3 Glasses of wine per week  ?  Comment: occasional   ? Drug use: No  ? Sexual activity: Yes  ?Other Topics Concern  ? Not on file  ?Social History Narrative  ? Left Handed  ? Lives in a one story home  ? Drinks Caffeine - once a day   ? ?Social Determinants of Health  ? ?Financial Resource Strain: Not on file  ?Food Insecurity: Not on file  ?Transportation Needs: Not on file  ?Physical Activity: Not on file  ?Stress: Not on file  ?Social Connections: Not on file  ?Intimate Partner Violence: Not on file  ?  ?Family History:   ? ?Family History  ?Problem Relation Age of Onset  ? Brain cancer Mother   ?     brain tumor  ? Colon cancer Mother   ? Stroke Father   ? Rectal cancer Neg Hx   ? Stomach cancer Neg Hx   ? Esophageal cancer Neg Hx   ?  ? ?ROS:  ?Please see the history of present illness.  ?General:no colds or fevers, no weight  changes ?Skin:no rashes or ulcers ?HEENT:no blurred vision, no congestion ?CV:see HPI ?PUL:see HPI ?GI:no diarrhea constipation or melena, no indigestion ?GU:no hematuria, no dysuria ?MS:no joint pain, no claudication ?Neuro:no syncope, no lightheadedness ?Endo:no diabetes, no thyroid disease ? ?All other ROS reviewed  and negative.    ? ?Physical Exam/Data:  ? ?Vitals:  ? 04/28/22 1228 04/28/22 1253 04/28/22 1300  ?BP: 104/80  116/88  ?Pulse: 79  71  ?Resp: 17  (!) 23  ?Temp:  98.9 ?F (37.2 ?C)   ?TempSrc:  Oral   ?SpO2: 100%  95%  ? ?No intake or output data in the 24 hours ending 04/28/22 1455 ? ?  04/12/2022  ?  9:57 AM 03/31/2022  ?  9:51 AM 03/29/2022  ?  2:23 PM  ?Last 3 Weights  ?Weight (lbs) 195 lb 199 lb 199 lb 6.4 oz  ?Weight (kg) 88.451 kg 90.266 kg 90.447 kg  ?   ?There is no height or weight on file to calculate BMI.  ?General:  Well nourished, well developed, in no acute distress  ?HEENT: normal ?Neck: minimal JVD flat in bed ?Vascular: No carotid bruits; Distal pulses 2+ bilaterally ?Cardiac:  normal S1, S2; RRR; no murmur  gallup rub or click ?Lungs:  clear to auscultation bilaterally, no wheezing, rhonchi or rales  ?Abd: soft, nontender, no hepatomegaly  ?Ext: no edema ?Musculoskeletal:  No deformities, BUE and BLE strength normal and equal ?Skin: warm and dry  ?Neuro:  alert and oriented X 3 MAE follows commands, no focal abnormalities noted ?Psych:  Normal affect  ? ? ?Relevant CV Studies: ?Echo 04/06/22 ?IMPRESSIONS  ? ? ? 1. Left ventricular ejection fraction, by estimation, is 40 to 45%. The  ?left ventricle has mildly decreased function. The left ventricle  ?demonstrates global hypokinesis. There is severe concentric left  ?ventricular hypertrophy.  ? 2. Right ventricular systolic function is moderately reduced. The right  ?ventricular size is mildly enlarged. Moderately increased right  ?ventricular wall thickness.  ? 3. Left atrial size was severely dilated.  ? 4. Right atrial size was severely  dilated.  ? 5. The mitral valve is normal in structure. Mild mitral valve  ?regurgitation.  ? 6. Aortic valve regurgitation is mild to moderate.  ? 7. Consider work-up for amyloidosis given biventricular dysfunction,  ?severe LVH,

## 2022-04-28 NOTE — ED Provider Notes (Addendum)
?[facility] ?Provider Note ? ? ?CSN: [id number] ?Arrival date & time: 04/28/22  1217 ? ?  ? ?History ? ?No chief complaint on file. ? ? ?Jordan [name]. is a 76 y.o. male. ? ?HPI ? ?76 year old male presents emergency department with less than 1 day history of weakness.  He was on his way to his exercise stress test this morning when he began to feel excessively weak and tired.  His blood pressure was taken and he was told he was hypotensive with a systolic blood pressure of 60 and should come to the emergency department. He was initially somnolent upon initial exam.  Reevaluation after fluids given showed much better historian.  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  This weakness/lethargy is also been exacerbated by his chronic history of systolic heart failure since at least 2018.  He states that his baseline activity is relatively minimal secondary to this weakness in easily fatigability.  Today's episode was the worst he is experienced.  He denies fever, chills, night sweats, chest pain, shortness of breath, nausea/vomiting/diarrhea, urinary symptoms, change in bowel habits.  Patient states only recent medication change was beginning [place] 3 to 4 days ago. ? ?He has a past medical history significant for persistent atrial fibrillation with anticoagulation on Eliquis, nonischemic cardiomyopathy, stage III kidney disease, substance abuse, hepatitis C, prostate cancer, colon cancer, DVT.  ? ? ?Home Medications ?Prior to Admission medications   ?Medication Sig Start Date End Date Taking? Authorizing Provider  ?allopurinol (ZYLOPRIM) 100 MG tablet Take 1 tablet (100 mg total) by mouth daily. 05/18/21  Yes Angiulli, Lavon Paganini, PA-C  ?apixaban (ELIQUIS) 5 MG TABS tablet Take 1 tablet (5 mg total) by mouth 2 (two) times daily. 05/18/21  Yes Angiulli,  Lavon Paganini, PA-C  ?atorvastatin (LIPITOR) 40 MG tablet Take 1 tablet (40 mg total) by mouth daily. 05/18/21  Yes Angiulli, Lavon Paganini, PA-C  ?carvedilol (COREG) 25 MG tablet TAKE 1 TABLET(25 MG) BY MOUTH TWICE DAILY WITH A MEAL ?Patient taking differently: Take 25 mg by mouth 2 (two) times daily with a meal. 05/18/21  Yes Angiulli, Lavon Paganini, PA-C  ?FARXIGA 10 MG TABS tablet Take 10 mg by mouth daily. 03/13/22  Yes [provider]  ?furosemide (LASIX) 20 MG tablet Take 1 tablet (20 mg total) by mouth daily. 04/12/22 04/07/23 Yes Evans Lance, MD  ?hydrALAZINE (APRESOLINE) 100 MG tablet Take 1 tablet (100 mg total) by mouth 3 (three) times daily. 10/21/21 04/28/22 Yes Almyra Deforest, PA  ?losartan (COZAAR) 100 MG tablet Take 1 tablet (100 mg total) by mouth daily. 05/18/21  Yes Angiulli, Lavon Paganini, PA-C  ?Blood Pressure Monitor KIT Use as directed by your health care provider 10/09/21   Pixie Casino, MD  ?   ? ?Allergies    ?Patient has no known allergies.   ? ?Review of Systems   ?Review of Systems  ?Constitutional:  Positive for fatigue. Negative for chills and fever.  ?HENT:  Negative for congestion.   ?Respiratory:  Negative for cough, shortness of breath and wheezing.   ?Cardiovascular:  Negative for chest pain and palpitations.  ?Gastrointestinal:  Negative for abdominal pain, blood in stool, constipation, diarrhea, nausea and vomiting.  ?Genitourinary:  Negative for dysuria and urgency.  ?Neurological:  Positive for weakness.  ?All other systems reviewed and are negative. ? ?Physical  Exam ?Updated Vital Signs ?BP 116/88   Pulse 71   Temp 98.9 ?F (37.2 ?C) (Oral)   Resp (!) 23   SpO2 95%  ?Physical Exam ?Constitutional:   ?   General: He is not in acute distress. ?   Appearance: Normal appearance. He is obese. He is not ill-appearing, toxic-appearing or diaphoretic.  ?HENT:  ?   Head: Normocephalic.  ?Eyes:  ?   General: No scleral icterus.    ?   Right eye: No discharge.     ?   Left eye: No discharge.  ?    Extraocular Movements: Extraocular movements intact.  ?   Conjunctiva/sclera: Conjunctivae normal.  ?   Pupils: Pupils are equal, round, and reactive to light.  ?Cardiovascular:  ?   Rate and Rhythm: Normal rate. Rhythm irregular.  ?   Pulses: Normal pulses.  ?   Heart sounds: Normal heart sounds. No murmur heard. ?   Comments: Irregularly irregular rhythm ?Pulmonary:  ?   Effort: Pulmonary effort is normal.  ?   Breath sounds: Normal breath sounds. No wheezing or rales.  ?Abdominal:  ?   General: Abdomen is flat.  ?   Palpations: Abdomen is soft.  ?   Tenderness: There is no abdominal tenderness. There is no guarding.  ?Musculoskeletal:     ?   General: Normal range of motion.  ?   Cervical back: Normal range of motion and neck supple.  ?   Comments: Mild bilateral lower extremity pitting edema  ?Skin: ?   Capillary Refill: Capillary refill takes less than 2 seconds.  ?Neurological:  ?   General: No focal deficit present.  ?   Mental Status: He is alert and oriented to person, place, and time.  ?   Cranial Nerves: Cranial nerves 2-12 are intact.  ?   Motor: Weakness present. No tremor, seizure activity or pronator drift.  ?   Coordination: Finger-Nose-Finger Test and Heel to Alleman Test normal.  ?   Comments: Notable weakness in left upper extremity grip strength, elbow flexion, elbow extension.  Patient states that this is baseline.  ?Psychiatric:     ?   Mood and Affect: Mood normal.     ?   Behavior: Behavior normal.  ? ? ?ED Results / Procedures / Treatments   ?Labs ?(all labs ordered are listed, but only abnormal results are displayed) ?Labs Reviewed  ?COMPREHENSIVE METABOLIC PANEL - Abnormal; Notable for the following components:  ?    Result Value  ? CO2 19 (*)   ? Glucose, Bld 161 (*)   ? BUN 29 (*)   ? Creatinine, Ser 1.78 (*)   ? Albumin 3.1 (*)   ? AST 43 (*)   ? Total Bilirubin 1.5 (*)   ? GFR, Estimated 39 (*)   ? All other components within normal limits  ?BRAIN NATRIURETIC PEPTIDE - Abnormal; Notable  for the following components:  ? B Natriuretic Peptide 772.8 (*)   ? All other components within normal limits  ?RESP PANEL BY RT-PCR (FLU A&B, COVID) ARPGX2  ?CBC WITH DIFFERENTIAL/PLATELET  ?MAGNESIUM  ?AMMONIA  ?TSH  ? ? ?EKG ?None ? ?Radiology ?DG Chest Port 1 View ? ?Result Date: 04/28/2022 ?CLINICAL DATA:  Weakness, lethargy, hypotension. EXAM: PORTABLE CHEST 1 VIEW COMPARISON:  04/27/2021 and CT chest 06/15/2006. FINDINGS: Trachea is midline. Heart size stable. Linear scarring in the lingula. Lungs are otherwise clear. No pleural fluid. IMPRESSION: No acute findings. Electronically Signed   By: Rip Harbour  Blietz M.D.   On: 04/28/2022 12:46   ? ?Procedures ?Procedures  ? ? ?Medications Ordered in ED ?Medications  ?sodium chloride flush (NS) 0.9 % injection 3 mL (3 mLs Intravenous Given 04/28/22 1443)  ?acetaminophen (TYLENOL) tablet 650 mg (has no administration in time range)  ?  Or  ?acetaminophen (TYLENOL) suppository 650 mg (has no administration in time range)  ?albuterol (PROVENTIL) (2.5 MG/3ML) 0.083% nebulizer solution 2.5 mg (has no administration in time range)  ?allopurinol (ZYLOPRIM) tablet 100 mg (has no administration in time range)  ?carvedilol (COREG) tablet 25 mg (has no administration in time range)  ?dapagliflozin propanediol (FARXIGA) tablet 10 mg (has no administration in time range)  ?apixaban (ELIQUIS) tablet 5 mg (has no administration in time range)  ?sodium chloride 0.9 % bolus 1,000 mL (0 mLs Intravenous Stopped 04/28/22 1430)  ? ? ?ED Course/ Medical Decision Making/ A&P ?Clinical Course as of 04/28/22 1453  ?Wed Apr 28, 2022  ?[facility]ology. They will see him in the hospital tomorrow. Recommended medicine to admit.  [CR]  ?[facility] hospitalist Dr. Tamala Julian.  He will see and admit him. [CR]  ?  ?Clinical Course User Index ?[CR] Wilnette Kales, PA  ? ?                        ?Medical Decision Making ?Amount and/or Complexity of Data Reviewed ?Labs: ordered. ?Radiology:  ordered. ? ?Risk ?Decision regarding hospitalization. ? ? ?This patient presents to the ED for concern of hypotension/weakness, this involves an extensive number of treatment options, and is a complaint that c

## 2022-04-28 NOTE — Progress Notes (Signed)
Patient arrived for CPX at 1100 in the HF clinic- Cardiopulmonary Exercise Testing lab and complained of sleepiness and weakness since he woke early this morning. Upon Pre-exercise spirometry patient presented with more lethargy and fatigue.  No exercise was performed. Weight down approx 10lbs from 1 month prior in OV with Dr. Lovena Le. BP difficult to obtain and radial pulse was soft. O2 saturation 100% with EKG showing atrial fibrillation with rate control. Physician in clinic (D. Bensimhon) notified and MAP was attempted by provider with best at 1. Decision was made to transport to ED as patient was groaning and complaining of being tired.  ? ?Patient belongings transported with patient to ED.  ? ? ?Kathy Breach, MS, ACSM, [facility] ?Clinical Exercise Physiologist/ Health and Wellness Coach  ? ? ?

## 2022-04-29 ENCOUNTER — Observation Stay (HOSPITAL_COMMUNITY): Payer: Medicare Other

## 2022-04-29 ENCOUNTER — Encounter (HOSPITAL_COMMUNITY): Payer: Self-pay | Admitting: Internal Medicine

## 2022-04-29 DIAGNOSIS — I4819 Other persistent atrial fibrillation: Secondary | ICD-10-CM | POA: Diagnosis not present

## 2022-04-29 DIAGNOSIS — Z8739 Personal history of other diseases of the musculoskeletal system and connective tissue: Secondary | ICD-10-CM | POA: Diagnosis not present

## 2022-04-29 DIAGNOSIS — I959 Hypotension, unspecified: Secondary | ICD-10-CM | POA: Diagnosis not present

## 2022-04-29 DIAGNOSIS — I5022 Chronic systolic (congestive) heart failure: Secondary | ICD-10-CM | POA: Diagnosis not present

## 2022-04-29 LAB — BASIC METABOLIC PANEL
Anion gap: 10 (ref 5–15)
BUN: 25 mg/dL — ABNORMAL HIGH (ref 8–23)
CO2: 18 mmol/L — ABNORMAL LOW (ref 22–32)
Calcium: 8.9 mg/dL (ref 8.9–10.3)
Chloride: 110 mmol/L (ref 98–111)
Creatinine, Ser: 1.68 mg/dL — ABNORMAL HIGH (ref 0.61–1.24)
GFR, Estimated: 42 mL/min — ABNORMAL LOW (ref >60)
Glucose, Bld: 97 mg/dL (ref 70–99)
Potassium: 4 mmol/L (ref 3.5–5.1)
Sodium: 138 mmol/L (ref 135–145)

## 2022-04-29 LAB — TSH: TSH: 1.486 u[IU]/mL (ref 0.350–4.500)

## 2022-04-29 LAB — CBC
HCT: 46.8 % (ref 39.0–52.0)
Hemoglobin: 16.4 g/dL (ref 13.0–17.0)
MCH: 30.9 pg (ref 26.0–34.0)
MCHC: 35 g/dL (ref 30.0–36.0)
MCV: 88.1 fL (ref 80.0–100.0)
Platelets: 240 10*3/uL (ref 150–400)
RBC: 5.31 MIL/uL (ref 4.22–5.81)
RDW: 14.5 % (ref 11.5–15.5)
WBC: 7.3 10*3/uL (ref 4.0–10.5)
nRBC: 0 % (ref 0.0–0.2)

## 2022-04-29 MED ORDER — TECHNETIUM TC 99M PYROPHOSPHATE
21.0000 | Freq: Once | INTRAVENOUS | Status: AC
Start: 2022-04-29 — End: 2022-04-29
  Administered 2022-04-29: 21 via INTRAVENOUS

## 2022-04-29 MED ORDER — FUROSEMIDE 20 MG PO TABS
20.0000 mg | ORAL_TABLET | Freq: Every day | ORAL | Status: DC | PRN
Start: 2022-04-29 — End: 2023-02-08

## 2022-04-29 MED ORDER — CARVEDILOL 25 MG PO TABS: 12.5000 mg | ORAL_TABLET | Freq: Two times a day (BID) | ORAL | 0 refills | Status: DC

## 2022-04-29 NOTE — Evaluation (Signed)
Physical Therapy Evaluation and Discharge ?Patient Details ?Name: Jordan [name]. ?MRN: [id number] ?DOB: 19-Jun-1946 ?Today's Date: 04/29/2022 ? ?History of Present Illness ? Pt is a 76 y/o male admitted secondary to hypotension. PMH includes a fib, gout, HTN, CHF, and hep C.  ?Clinical Impression ? Patient evaluated by Physical Therapy with no further acute PT needs identified. All education has been completed and the patient has no further questions. Pt overall steady at a mod I to supervision level for mobility tasks. Educated about activity pacing to perform at home. Reports family can check on if needed. See below for any follow-up Physical Therapy or equipment needs. PT is signing off. Thank you for this referral. If needs change, please re-consult.  ?   ?   ? ?Recommendations for follow up therapy are one component of a multi-disciplinary discharge planning process, led by the attending physician.  Recommendations may be updated based on patient status, additional functional criteria and insurance authorization. ? ?Follow Up Recommendations No PT follow up ? ?  ?Assistance Recommended at Discharge Intermittent Supervision/Assistance  ?Patient can return home with the following ? Assistance with cooking/housework ? ?  ?Equipment Recommendations None recommended by PT  ?Recommendations for Other Services ?    ?  ?Functional Status Assessment Patient has had a recent decline in their functional status and demonstrates the ability to make significant improvements in function in a reasonable and predictable amount of time.  ? ?  ?Precautions / Restrictions Precautions ?Precautions: Fall ?Restrictions ?Weight Bearing Restrictions: No  ? ?  ? ?Mobility ? Bed Mobility ?Overal bed mobility: Modified Independent ?  ?  ?  ?  ?  ?  ?  ?  ? ?Transfers ?Overall transfer level: Modified independent ?  ?  ?  ?  ?  ?  ?  ?  ?  ?  ? ?Ambulation/Gait ?Ambulation/Gait assistance: Supervision ?Gait Distance (Feet): 175  Feet ?Assistive device: None ?Gait Pattern/deviations: Step-through pattern, Decreased stride length ?Gait velocity: Decreased ?  ?  ?General Gait Details: Supervision for safety. Reports some weakness, but did not affect mobility. No LOB noted. ? ?Stairs ?  ?  ?  ?  ?  ? ?Wheelchair Mobility ?  ? ?Modified Rankin (Stroke Patients Only) ?  ? ?  ? ?Balance Overall balance assessment: Mild deficits observed, not formally tested ?  ?  ?  ?  ?  ?  ?  ?  ?  ?  ?  ?  ?  ?  ?  ?  ?  ?  ?   ? ? ? ?Pertinent Vitals/Pain Pain Assessment ?Pain Assessment: No/denies pain  ? ? ?Home Living Family/patient expects to be discharged to:: Private residence ?Living Arrangements: Alone ?Available Help at Discharge: Family ?Type of Home: Apartment ?Home Access: Level entry ?  ?  ?  ?Home Layout: One level ?Home Equipment: Conservation officer, nature (2 wheels);Cane - single point ?   ?  ?Prior Function Prior Level of Function : Independent/Modified Independent;Driving ?  ?  ?  ?  ?  ?  ?Mobility Comments: Reports independence ?  ?  ? ? ?Hand Dominance  ?   ? ?  ?Extremity/Trunk Assessment  ? Upper Extremity Assessment ?Upper Extremity Assessment: Overall WFL for tasks assessed ?  ? ?Lower Extremity Assessment ?Lower Extremity Assessment: Generalized weakness ?  ? ?Cervical / Trunk Assessment ?Cervical / Trunk Assessment: Normal  ?Communication  ? Communication: No difficulties  ?Cognition Arousal/Alertness: Awake/alert ?Behavior During Therapy: [facility] for  tasks assessed/performed ?Overall Cognitive Status: Within Functional Limits for tasks assessed ?  ?  ?  ?  ?  ?  ?  ?  ?  ?  ?  ?  ?  ?  ?  ?  ?  ?  ?  ? ?  ?General Comments   ? ?  ?Exercises    ? ?Assessment/Plan  ?  ?PT Assessment Patient does not need any further PT services  ?PT Problem List   ? ?   ?  ?PT Treatment Interventions     ? ?PT Goals (Current goals can be found in the Care Plan section)  ?Acute Rehab PT Goals ?Patient Stated Goal: to go home ?PT Goal Formulation: With patient ?Time  For Goal Achievement: 04/29/22 ?Potential to Achieve Goals: Good ? ?  ?Frequency   ?  ? ? ?Co-evaluation   ?  ?  ?  ?  ? ? ?  ?AM-PAC PT "6 Clicks" Mobility  ?Outcome Measure Help needed turning from your back to your side while in a flat bed without using bedrails?: None ?Help needed moving from lying on your back to sitting on the side of a flat bed without using bedrails?: None ?Help needed moving to and from a bed to a chair (including a wheelchair)?: None ?Help needed standing up from a chair using your arms (e.g., wheelchair or bedside chair)?: None ?Help needed to walk in hospital room?: None ?Help needed climbing 3-5 steps with a railing? : A Little ?6 Click Score: 23 ? ?  ?End of Session Equipment Utilized During Treatment: Gait belt ?Activity Tolerance: Patient tolerated treatment well ?Patient left: in bed;with call bell/phone within reach (on stretcher in ED) ?Nurse Communication: Mobility status ?PT Visit Diagnosis: Other abnormalities of gait and mobility (R26.89) ?  ? ?Time: 1245-8099 ?PT Time Calculation (min) (ACUTE ONLY): 19 min ? ? ?Charges:   PT Evaluation ?$PT Eval Low Complexity: 1 Low ?  ?  ?   ? ? ?Reuel Derby, PT, DPT  ?Acute Rehabilitation Services  ?Pager: [phone] ?Office: [phone] ? ? ?[place] ?04/29/2022, 12:11 PM ?

## 2022-04-29 NOTE — Discharge Summary (Signed)
Physician Discharge Summary  ?Jordan [name]. XAJ:[id number] DOB: 06-13-1946 DOA: 04/28/2022 ? ?PCP: [name], MD ? ?Admit date: 04/28/2022 ?Discharge date: 04/29/2022 ? ?Admitted From: Home ?Disposition: Home ? ?Recommendations for Outpatient Follow-up:  ?Follow up with PCP in 1 week with repeat BMP ?Outpatient follow-up with cardiology ?Follow up in ED if symptoms worsen or new appear ? ? ?Home Health: No ?Equipment/Devices: None ? ?Discharge Condition: Stable ?CODE STATUS: Full ?Diet recommendation: Heart healthy/fluid restriction of up to 1200 cc a day ? ?Brief/Interim Summary: ?76 y.o. male with medical history significant of hypertension, chronic atrial fibrillation on anticoagulation, HFrEF last EF 40-45% on 4/11, chronic hepatitis C presented with tiredness and weakness and was found to be hypotensive.  On presentation, creatinine was 1.78 with BNP of 772.8.  Chest x-ray showed no acute abnormality.  He was given IV fluids.  Cardiology was consulted.  During the hospitalization, his condition has improved.  His antihypertensive regimen has been changed by cardiology.  His blood pressure has much improved.  Cardiology cleared the patient for discharge.  He will be discharged home today with close outpatient follow-up with PCP and cardiology. ? ?Discharge Diagnoses:  ? ?Hypotension ?History of hypertension ?-Possibly from antihypertensive use.  He was on Coreg, Lasix, losartan and hydralazine at home. ?-Presented with MAP of 60.  Treated with IV fluids.  Cardiology has made changes in his antihypertensive regimen.  Blood pressure has improved.  Cardiology recommends to continue Coreg 12.5 mg twice a day.  Use Lasix only as needed for weight gain/shortness of breath.  Hold losartan and hydralazine till reevaluation by PCP. ?-Discharge patient home today.  Outpatient follow-up with PCP and cardiology.  Tolerated PT ? ?Chronic systolic heart failure ?-Currently compensated.  Continue diet and fluid  restriction.  Medication plan as above.  Outpatient follow-up with cardiology ?-Continue Farxiga ? ?Chronic atrial fibrillation ?-Currently rate controlled.  Continue Coreg as above.  Continue Eliquis ? ?History of gout ?-Continue allopurinol ? ? ? ?Discharge Instructions ? ?Discharge Instructions   ? ? Diet - low sodium heart healthy   Complete by: As directed ?  ? Increase activity slowly   Complete by: As directed ?  ? ?  ? ?Allergies as of 04/29/2022   ?No Known Allergies ?  ? ?  ?Medication List  ?  ? ?STOP taking these medications   ? ?hydrALAZINE 100 MG tablet ?Commonly known as: APRESOLINE ?  ?losartan 100 MG tablet ?Commonly known as: COZAAR ?  ? ?  ? ?TAKE these medications   ? ?allopurinol 100 MG tablet ?Commonly known as: ZYLOPRIM ?Take 1 tablet (100 mg total) by mouth daily. ?  ?atorvastatin 40 MG tablet ?Commonly known as: LIPITOR ?Take 1 tablet (40 mg total) by mouth daily. ?  ?Blood Pressure Monitor Kit ?Use as directed by your health care provider ?  ?carvedilol 25 MG tablet ?Commonly known as: COREG ?Take 0.5 tablets (12.5 mg total) by mouth 2 (two) times daily with a meal. TAKE 1 TABLET(25 MG) BY MOUTH TWICE DAILY WITH A MEAL ?What changed:  ?how much to take ?how to take this ?when to take this ?  ?Eliquis 5 MG Tabs tablet ?Generic drug: apixaban ?Take 1 tablet (5 mg total) by mouth 2 (two) times daily. ?  ?Farxiga 10 MG Tabs tablet ?Generic drug: dapagliflozin propanediol ?Take 10 mg by mouth daily. ?  ?furosemide 20 MG tablet ?Commonly known as: LASIX ?Take 1 tablet (20 mg total) by mouth daily as needed for fluid or  edema. ?What changed:  ?when to take this ?reasons to take this ?  ? ?  ? ? Follow-up Information   ? ? Varadarajan, Rupashree, MD. Schedule an appointment as soon as possible for a visit in 1 week(s).   ?Specialty: Internal Medicine ?Why: with repeat bmp ?Contact information: ?[address] ?[address] ?[place] [address] ?[phone] ? ? ?  ?  ? ? Hilty, Kenneth C, MD .    ?Specialty: Cardiology ?Contact information: ?[address] ?[address] ? [place] [address] ?[phone] ? ? ?  ?  ? ?  ?  ? ?  ? ?No Known Allergies ? ?Consultations: ?Cardiology ? ? ?Procedures/Studies: ?DG Chest Port 1 View ? ?Result Date: 04/28/2022 ?CLINICAL DATA:  Weakness, lethargy, hypotension. EXAM: PORTABLE CHEST 1 VIEW COMPARISON:  04/27/2021 and CT chest 06/15/2006. FINDINGS: Trachea is midline. Heart size stable. Linear scarring in the lingula. Lungs are otherwise clear. No pleural fluid. IMPRESSION: No acute findings. Electronically Signed   By: Melinda  Blietz M.D.   On: 04/28/2022 12:46  ? ?MYOCARDIAL PERFUSION IMAGING ? ?Result Date: 03/31/2022 ?  The study is normal. The study is low risk.   No ST deviation was noted.   Nuclear stress EF: 23 %. The left ventricular ejection fraction is severely decreased (<30%). End diastolic cavity size is normal.   Prior study available for comparison from 05/01/2002.  ? ?ECHOCARDIOGRAM LIMITED ? ?Result Date: 04/06/2022 ?   ECHOCARDIOGRAM LIMITED REPORT   Patient Name:   Jordan [name]. Date of Exam: 04/06/2022 Medical Rec #:  [id number]             Height:       72.0 in Accession #:    [id number]            Weight:       199.0 lb Date of Birth:  03/16/1946              BSA:          2.126 m? Patient Age:    75 years              BP:           148/88 mmHg Patient Gender: M                     HR:           83 bpm. Exam Location:  [address] Procedure: Limited Echo, Limited Color Doppler and Cardiac Doppler Indications:    I51.9 LV dysfunction  History:        Patient has prior history of Echocardiogram examinations, most                 recent 04/28/2021. LV dysfunction, Arrythmias:Atrial Fibrillation                 and NSVT, Signs/Symptoms:Shortness of Breath; Risk                 Factors:Hypertension.  Sonographer:    William Edwards RDCS Referring Phys: [id number] KENNETH C HILTY  Sonographer Comments: Image acquisition challenging due to respiratory motion.  IMPRESSIONS  1. Left ventricular ejection fraction, by estimation, is 40 to 45%. The left ventricle has mildly decreased function. The left ventricle demonstrates global hypokinesis. There is severe concentric left ventricular hypertrophy.  2. Right ventricular systolic function is moderately reduced. The right ventricular size is mildly enlarged. Moderately increased right ventricular wall thickness.  3. Left atrial size   was severely dilated.  4. Right atrial size was severely dilated.  5. The mitral valve is normal in structure. Mild mitral valve regurgitation.  6. Aortic valve regurgitation is mild to moderate.  7. Consider work-up for amyloidosis given biventricular dysfunction, severe LVH, severe biatrial enlargement if clinically indicated. FINDINGS  Left Ventricle: Left ventricular ejection fraction, by estimation, is 40 to 45%. The left ventricle has mildly decreased function. The left ventricle demonstrates global hypokinesis. The left ventricular internal cavity size was normal in size. There is  severe concentric left ventricular hypertrophy. Right Ventricle: The right ventricular size is mildly enlarged. Moderately increased right ventricular wall thickness. Right ventricular systolic function is moderately reduced. Left Atrium: Left atrial size was severely dilated. Right Atrium: Right atrial size was severely dilated. Mitral Valve: The mitral valve is normal in structure. There is mild thickening of the mitral valve leaflet(s). Mild mitral valve regurgitation. Tricuspid Valve: The tricuspid valve is normal in structure. Tricuspid valve regurgitation is mild. Aortic Valve: Aortic valve regurgitation is mild. Aortic regurgitation PHT measures 806 msec. Aorta: The aortic root is normal in size and structure. LEFT VENTRICLE PLAX 2D LVIDd:         4.30 cm LVIDs:         3.60 cm LV PW:         2.40 cm LV IVS:        1.30 cm LVOT diam:     2.10 cm LVOT Area:     3.46 cm?  RIGHT VENTRICLE RVSP:           37.6  mmHg LEFT ATRIUM         Index       RIGHT ATRIUM LA diam:    6.10 cm 2.87 cm/m?  RA Pressure: 3.00 mmHg  AORTIC VALVE AI PHT:      806 msec  AORTA Ao Root diam: 3.40 cm MITRAL VALVE               TRICUSPID VAL

## 2022-04-29 NOTE — Progress Notes (Signed)
PT Cancellation Note ? ?Patient Details ?Name: Jordan [name]. ?MRN: [id number] ?DOB: [birth year] ? ? ?Cancelled Treatment:    Reason Eval/Treat Not Completed: Patient at procedure or test/unavailable Pt currently in [place]. Will follow up as schedule allows.  ? ?Reuel Derby, PT, DPT  ?Acute Rehabilitation Services  ?Pager: [phone] ?Office: [phone] ? ? ? ?[place] ?04/29/2022, 10:52 AM ?

## 2022-04-29 NOTE — ED Notes (Signed)
Pt transported to [place] 

## 2022-04-29 NOTE — Progress Notes (Signed)
?Cardiology Progress Note  ?Patient ID: Jordan [name]. ?MRN: [id number] ?DOB: 1946-07-01 ?Date of Encounter: 04/29/2022 ? ?Primary Cardiologist: Pixie Casino, MD ? ?Subjective  ? ?Chief Complaint: none.  ? ?HPI: BP much improved. Feeling better. PYP today.  ? ?ROS:  ?All other ROS reviewed and negative. Pertinent positives noted in the HPI.    ? ?Inpatient Medications  ?Scheduled Meds: ? allopurinol  100 mg Oral Daily  ? apixaban  5 mg Oral BID  ? atorvastatin  40 mg Oral Daily  ? carvedilol  12.5 mg Oral BID WC  ? sodium chloride flush  3 mL Intravenous Q12H  ? ?Continuous Infusions: ? ?PRN Meds: ?acetaminophen **OR** acetaminophen, albuterol  ? ?Vital Signs  ? ?Vitals:  ? 04/29/22 0350 04/29/22 0530 04/29/22 0715 04/29/22 0730  ?BP:  116/90 103/89 113/89  ?Pulse:  (!) 59 61 82  ?Resp:  16 (!) 21 18  ?Temp:      ?TempSrc:      ?SpO2:  100% 99% 97%  ?Weight: 83.5 kg     ?Height: 6' (1.829 m)     ? ? ?Intake/Output Summary (Last 24 hours) at 04/29/2022 0816 ?Last data filed at 04/29/2022 0351 ?Gross per 24 hour  ?Intake --  ?Output 725 ml  ?Net -725 ml  ? ? ?  04/29/2022  ?  3:50 AM 04/12/2022  ?  9:57 AM 03/31/2022  ?  9:51 AM  ?Last 3 Weights  ?Weight (lbs) 184 lb 195 lb 199 lb  ?Weight (kg) 83.462 kg 88.451 kg 90.266 kg  ?   ? ?Telemetry  ?Overnight telemetry shows A-fib heart rate 70-80 bpm, frequent NSVT, which I personally reviewed.  ? ? ?Physical Exam  ? ?Vitals:  ? 04/29/22 0350 04/29/22 0530 04/29/22 0715 04/29/22 0730  ?BP:  116/90 103/89 113/89  ?Pulse:  (!) 59 61 82  ?Resp:  16 (!) 21 18  ?Temp:      ?TempSrc:      ?SpO2:  100% 99% 97%  ?Weight: 83.5 kg     ?Height: 6' (1.829 m)     ?  ?Intake/Output Summary (Last 24 hours) at 04/29/2022 0816 ?Last data filed at 04/29/2022 0351 ?Gross per 24 hour  ?Intake --  ?Output 725 ml  ?Net -725 ml  ?  ? ?  04/29/2022  ?  3:50 AM 04/12/2022  ?  9:57 AM 03/31/2022  ?  9:51 AM  ?Last 3 Weights  ?Weight (lbs) 184 lb 195 lb 199 lb  ?Weight (kg) 83.462 kg 88.451 kg 90.266 kg  ?   Body mass index is 24.95 kg/m?.  ?General: Well nourished, well developed, in no acute distress ?Head: Atraumatic, normal size  ?Eyes: PEERLA, EOMI  ?Neck: Supple, no JVD ?Endocrine: No thryomegaly ?Cardiac: Normal S1, S2; irregular rhythm, no murmurs ?Lungs: Clear to auscultation bilaterally, no wheezing, rhonchi or rales  ?Abd: Soft, nontender, no hepatomegaly  ?Ext: No edema, pulses 2+ ?Musculoskeletal: No deformities, BUE and BLE strength normal and equal ?Skin: Warm and dry, no rashes   ?Neuro: Alert and oriented to person, place, time, and situation, CNII-XII grossly intact, no focal deficits  ?Psych: Normal mood and affect  ? ?Labs  ?High Sensitivity Troponin:   ?Recent Labs  ?Lab 04/28/22 ?1954  ?TROPONINIHS 86*  ?   ?Cardiac EnzymesNo results for input(s): TROPONINI in the last 168 hours. No results for input(s): TROPIPOC in the last 168 hours.  ?Chemistry ?Recent Labs  ?Lab 04/28/22 ?1226 04/29/22 ?0400  ?NA 138 138  ?  K 4.4 4.0  ?CL 111 110  ?CO2 19* 18*  ?GLUCOSE 161* 97  ?BUN 29* 25*  ?CREATININE 1.78* 1.68*  ?CALCIUM 9.2 8.9  ?PROT 7.4  --   ?ALBUMIN 3.1*  --   ?AST 43*  --   ?ALT 27  --   ?ALKPHOS 72  --   ?BILITOT 1.5*  --   ?GFRNONAA 39* 42*  ?ANIONGAP 8 10  ?  ?Hematology ?Recent Labs  ?Lab 04/28/22 ?1226 04/29/22 ?0400  ?WBC 7.1 7.3  ?RBC 5.60 5.31  ?HGB 17.0 16.4  ?HCT 50.0 46.8  ?MCV 89.3 88.1  ?MCH 30.4 30.9  ?MCHC 34.0 35.0  ?RDW 14.3 14.5  ?PLT 256 240  ? ?BNP ?Recent Labs  ?Lab 04/28/22 ?1226  ?BNP 772.8*  ?  ?DDimer No results for input(s): DDIMER in the last 168 hours.  ? ?Radiology  ?DG Chest Port 1 View ? ?Result Date: 04/28/2022 ?CLINICAL DATA:  Weakness, lethargy, hypotension. EXAM: PORTABLE CHEST 1 VIEW COMPARISON:  04/27/2021 and CT chest 06/15/2006. FINDINGS: Trachea is midline. Heart size stable. Linear scarring in the lingula. Lungs are otherwise clear. No pleural fluid. IMPRESSION: No acute findings. Electronically Signed   By: Lorin Picket M.D.   On: 04/28/2022 12:46    ? ?Cardiac Studies  ?TTE 04/06/2022 ? ? 1. Left ventricular ejection fraction, by estimation, is 40 to 45%. The  ?left ventricle has mildly decreased function. The left ventricle  ?demonstrates global hypokinesis. There is severe concentric left  ?ventricular hypertrophy.  ? 2. Right ventricular systolic function is moderately reduced. The right  ?ventricular size is mildly enlarged. Moderately increased right  ?ventricular wall thickness.  ? 3. Left atrial size was severely dilated.  ? 4. Right atrial size was severely dilated.  ? 5. The mitral valve is normal in structure. Mild mitral valve  ?regurgitation.  ? 6. Aortic valve regurgitation is mild to moderate.  ? 7. Consider work-up for amyloidosis given biventricular dysfunction,  ?severe LVH, severe biatrial enlargement if clinically indicated.  ? ?Patient Profile  ?Bernerd Terhune. is a 76 y.o. male with history of systolic heart failure, persistent atrial fibrillation, NSVT who was admitted on 04/28/2022 with hypotension secondary to dehydration. ? ?Assessment & Plan  ? ?#Hypotension ?-Suspect related to dehydration.  I do have concerns for cardiac amyloidosis.  Pursuing PYP today. ?-Would recommend continue to hold ARB and hydralazine.  We have reinitiated Coreg for A-fib rate control. ?-Also was on Lasix daily.  Likely can just take this as needed. ?-My strong suspicion is for cardiac amyloidosis.  This could clearly explain why he has been intolerant of medications.  We will see what his PYP scan shows. ? ?#AKI ?-Secondary to overdiuresis. ?-Improvement with holding blood pressure medication. ? ?#Systolic heart failure, 01% ?#Severe LVH ?#HTN ?-PYP scan today.  Concern is for possible cardiac amyloidosis. ?-Plan to hold ARB and hydralazine at discharge.  Would let his kidneys recover.  We will continue Coreg.  Blood pressure is fine this morning.  Can reinitiate other medications as we are able.  This can be done in the outpatient setting. ?-Okay to  restart Farxiga at discharge. ?-Would also plan for Lasix 20 mg daily as needed. ?-Once he obtains his PYP scan.  Can be discharged from my standpoint.  Hospital medicine may decide to keep him here for his AKI but it is improving. ? ?#Elevated BNP ?#Elevated troponin ?-Demand.  No evidence of volume overload.  Recent nuclear medicine stress test with no evidence  of CAD.  Very concerning for cardiac amyloidosis as above. ? ?[facility] will sign off.   ?Medication Recommendations: Coreg 12.5 mg twice daily, hold ARB/hydralazine, 20 mg Lasix daily as needed at discharge, Farxiga 10 mg daily, Eliquis 5 mg twice daily ?Other recommendations (labs, testing, etc): None ?Follow up as an outpatient: We will arrange hospital follow-up in 2 to 3 weeks ? ?For questions or updates, please contact [place] ?Please consult [email or web address] for contact info under  ?   ?Signed, ?[place] T. Audie Box, MD, [facility] ?[place]  ?04/29/2022 8:16 AM  ? ?

## 2022-05-11 ENCOUNTER — Ambulatory Visit (INDEPENDENT_AMBULATORY_CARE_PROVIDER_SITE_OTHER): Payer: Medicare Other | Admitting: Cardiovascular Disease

## 2022-05-11 ENCOUNTER — Encounter: Payer: Self-pay | Admitting: Cardiovascular Disease

## 2022-05-11 VITALS — BP 126/71 | HR 63 | Ht 73.0 in | Wt 186.6 lb

## 2022-05-11 DIAGNOSIS — I10 Essential (primary) hypertension: Secondary | ICD-10-CM | POA: Diagnosis not present

## 2022-05-11 DIAGNOSIS — I4819 Other persistent atrial fibrillation: Secondary | ICD-10-CM

## 2022-05-11 DIAGNOSIS — I43 Cardiomyopathy in diseases classified elsewhere: Secondary | ICD-10-CM

## 2022-05-11 DIAGNOSIS — I5022 Chronic systolic (congestive) heart failure: Secondary | ICD-10-CM | POA: Diagnosis not present

## 2022-05-11 DIAGNOSIS — E854 Organ-limited amyloidosis: Secondary | ICD-10-CM

## 2022-05-11 NOTE — Progress Notes (Signed)
?Cardiology Office Note:   ?Date:  05/11/2022  ?NAME:  Jordan [name].    ?MRN: [id number] ?DOB:  01-20-1946  ? ?PCP:  [name], MD  ?Cardiologist:  Pixie Casino, MD  ?Electrophysiologist:  None  ? ?Referring MD: [name],*  ? ?Chief Complaint  ?Patient presents with  ? Follow-up  ? ?History of Present Illness:   ?Jordan [name]. is a 76 y.o. male with a hx of cardiac amyloidosis, systolic HF, persistent Afib, CKD 3b who presents for follow-up.  He presents with his sister.  Doing well since leaving the hospital.  Blood pressure 126/71.  Reports low energy.  Remains in A-fib.  Has had A-fib for years.  He has undergone cardioversion in the past and it was successful but did return to atrial fibrillation.  Given his new diagnosis of cardiac amyloidosis I have recommended we repeat this at some point.  We also discussed the need for further testing.  He also needs genetic testing as well as serum immunofixation, urine immunofixation and urine light chains.  He is okay to complete this testing today.  No history of cardiac amyloidosis in the family.  No history of carpal tunnel.  Overall euvolemic on examination but just having issues with low energy. ? ?Problem List ?Systolic HF ?-EF 66% ?-normal MPI 03/31/2022 ?Cardiac amyloidosis  ?-PYP+ ?3. Persistent Afib  ?4. CKD 3b ? ?Past Medical History: ?Past Medical History:  ?Diagnosis Date  ? Atrial fib/flutter, transient   ? Hx  ? Atrial fibrillation ([place])   ? coumadin  ? Colon cancer ([place])   ? family hx  ? Diverticulosis   ? Gout   ? Hemorrhoids   ? Hepatitis C   ? History of DVT (deep vein thrombosis)   ? History of nuclear stress test 04/2002  ? exercise; normal study   ? Hypertension   ? 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 history of   ? Personal history of colonic polyps   ? Prostate cancer ([place])   ? radical prostatectomy   ? ? ?Past Surgical History: ?Past Surgical History:  ?Procedure Laterality Date  ? CARDIAC  CATHETERIZATION  2007  ? no occlusive CAD  ? CARDIOVERSION N/A 10/07/2021  ? Procedure: CARDIOVERSION;  Surgeon: Pixie Casino, MD;  Location: [facility] ENDOSCOPY;  Service: Cardiovascular;  Laterality: N/A;  ? COLONOSCOPY  [phone]  ? [name]  ? PROSTATECTOMY    ? PROSTATECTOMY  04-04  ? Dr. Risa Grill  ? TRANSTHORACIC ECHOCARDIOGRAM  05/2006  ? EF ~59%; LV systolic function normal; mild focal basal septal hypertrophy; AV thickness mildly increased; LA mod-markedly dilate  ? ? ?Current Medications: ?Current Meds  ?Medication Sig  ? allopurinol (ZYLOPRIM) 100 MG tablet Take 1 tablet (100 mg total) by mouth daily.  ? apixaban (ELIQUIS) 5 MG TABS tablet Take 1 tablet (5 mg total) by mouth 2 (two) times daily.  ? atorvastatin (LIPITOR) 40 MG tablet Take 1 tablet (40 mg total) by mouth daily.  ? Blood Pressure Monitor KIT Use as directed by your health care provider  ? carvedilol (COREG) 25 MG tablet Take 0.5 tablets (12.5 mg total) by mouth 2 (two) times daily with a meal. TAKE 1 TABLET(25 MG) BY MOUTH TWICE DAILY WITH A MEAL  ? FARXIGA 10 MG TABS tablet Take 10 mg by mouth daily.  ? furosemide (LASIX) 20 MG tablet Take 1 tablet (20 mg total) by mouth daily as needed for fluid or edema.  ?  ? ?  Allergies:    ?Patient has no known allergies.  ? ?Social History: ?Social History  ? ?Socioeconomic History  ? Marital status: Divorced  ?  Spouse name: Not on file  ? Number of children: 1  ? Years of education: Not on file  ? Highest education level: Not on file  ?Occupational History  ? Occupation: retired  ?  Employer: [employer]  ?Tobacco Use  ? Smoking status: Never  ? Smokeless tobacco: Never  ?Vaping Use  ? Vaping Use: Never used  ?Substance and Sexual Activity  ? Alcohol use: Not Currently  ?  Alcohol/week: 3.0 standard drinks  ?  Types: 3 Glasses of wine per week  ?  Comment: occasional   ? Drug use: No  ? Sexual activity: Yes  ?Other Topics Concern  ? Not on file  ?Social History Narrative  ? Left Handed  ?  Lives in a one story home  ? Drinks Caffeine - once a day   ? ?Social Determinants of Health  ? ?Financial Resource Strain: Not on file  ?Food Insecurity: Not on file  ?Transportation Needs: Not on file  ?Physical Activity: Not on file  ?Stress: Not on file  ?Social Connections: Not on file  ?  ? ?Family History: ?The patient's family history includes Brain cancer in his mother; Colon cancer in his mother; Stroke in his father. There is no history of Rectal cancer, Stomach cancer, or Esophageal cancer. ? ?ROS:   ?All other ROS reviewed and negative. Pertinent positives noted in the HPI.    ? ?EKGs/Labs/Other Studies Reviewed:   ?The following studies were personally reviewed by me today: ? 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 ? ?TTE 04/06/2022 ? 1. Left ventricular ejection fraction, by estimation, is 40 to 45%. The  ?left ventricle has mildly decreased function. The left ventricle  ?demonstrates global hypokinesis. There is severe concentric left  ?ventricular hypertrophy.  ? 2. Right ventricular systolic function is moderately reduced. The right  ?ventricular size is mildly enlarged. Moderately increased right  ?ventricular wall thickness.  ? 3. Left atrial size was severely dilated.  ? 4. Right atrial size was severely dilated.  ? 5. The mitral valve is normal in structure. Mild mitral valve  ?regurgitation.  ? 6. Aortic valve regurgitation is mild to moderate.  ? 7. Consider work-up for amyloidosis given biventricular dysfunction,  ?severe LVH, severe biatrial enlargement if clinically indicated.  ? ?Recent Labs: ?04/28/2022: ALT 27; B Natriuretic Peptide 772.8; Magnesium 2.0; TSH 1.486 ?04/29/2022: BUN 25; Creatinine, Ser 1.68; Hemoglobin 16.4; Platelets 240; Potassium 4.0; Sodium 138  ? ?Recent Lipid Panel ?   ?Component Value Date/Time  ? CHOL 150 04/28/2021 0446  ? CHOL 157 05/03/2018 1017  ? TRIG 51 04/28/2021  0446  ? HDL 37 (L) 04/28/2021 0446  ? HDL 55 05/03/2018 1017  ? CHOLHDL 4.1 04/28/2021 0446  ? VLDL 10 04/28/2021 0446  ? [place] 103 (H) 04/28/2021 0446  ? [place] 88 05/03/2018 1017  ? ? ?Physical Exam:   ?VS:  BP 126/71   Pulse 63   Ht 6' 1" (1.854 m)   Wt 186 lb 9.6 oz (84.6 kg)   SpO2 99%   BMI 24.62 kg/m?    ?Wt Readings from Last 3 Encounters:  ?05/11/22 186 lb 9.6 oz (84.6 kg)  ?04/29/22 184 lb (83.5 kg)  ?04/12/22 195 lb (88.5 kg)  ?  ?General: Well nourished, well  developed, in no acute distress ?Head: Atraumatic, normal size  ?Eyes: PEERLA, EOMI  ?Neck: Supple, no JVD ?Endocrine: No thryomegaly ?Cardiac: Normal S1, S2; RRR; no murmurs, rubs, or gallops ?Lungs: Clear to auscultation bilaterally, no wheezing, rhonchi or rales  ?Abd: Soft, nontender, no hepatomegaly  ?Ext: No edema, pulses 2+ ?Musculoskeletal: No deformities, BUE and BLE strength normal and equal ?Skin: Warm and dry, no rashes   ?Neuro: Alert and oriented to person, place, time, and situation, CNII-XII grossly intact, no focal deficits  ?Psych: Normal mood and affect  ? ?ASSESSMENT:   ?Phenix Vandermeulen. is a 76 y.o. male who presents for the following: ?1. Chronic systolic heart failure ([place])   ?2. 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 Persistent atrial fibrillation ([place])   ?4. Essential hypertension   ? ? ?PLAN:   ?1. Chronic systolic heart failure ([place]) ?2. 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  Recent stress test with no significant ischemia or infarction.  Recent PYP scan positive for cardiac amyloidosis.  I believe this is the most likely culprit.  I believe his ejection fraction is close to normal. ?-He will need to complete genetic testing.  We will set him up for an [facility] genetic panel. ?-He will need serum immunofixation, urine immunofixation, urine light chains to exclude AL amyloidosis. ?-He will continue with Lasix as needed.  Continue Coreg for rate control.  He is on [place] as well.  He should  stop hydralazine.  We should be cautious with aggressive blood pressure medications in the setting of cardiac amyloidosis.  He understands this. ? ?3. Persistent atrial fibrillation ([place]) ?-10-year history of atr

## 2022-05-11 NOTE — Patient Instructions (Signed)
Medication Instructions:  ?Follow current medication list.  ? ?*If you need a refill on your cardiac medications before your next appointment, please call your pharmacy* ? ? ?Lab Work: ?PE and FLC Serum, Immunofixation (urine), Immunofixation (Serum) ? ?If you have labs (blood work) drawn today and your tests are completely normal, you will receive your results only by: ?MyChart Message (if you have MyChart) OR ?A paper copy in the mail ?If you have any lab test that is abnormal or we need to change your treatment, we will call you to review the results. ? ? ?Follow-Up: ?At [facility], you and your health needs are our priority.  As part of our continuing mission to provide you with exceptional heart care, we have created designated Provider Care Teams.  These Care Teams include your primary Cardiologist (physician) and Advanced Practice Providers (APPs -  Physician Assistants and Nurse Practitioners) who all work together to provide you with the care you need, when you need it. ? ?We recommend signing up for the patient portal called "MyChart".  Sign up information is provided on this After Visit Summary.  MyChart is used to connect with patients for Virtual Visits (Telemedicine).  Patients are able to view lab/test results, encounter notes, upcoming appointments, etc.  Non-urgent messages can be sent to your provider as well.   ?To learn more about what you can do with MyChart, go to [email or web address].   ? ?Your next appointment:   ?6 week(s) ? ?The format for your next appointment:   ?In Person ? ?Provider:   ?Eleonore Chiquito, MD ? ?Other Instructions ?Genetic testing- they will mail you a kit, this will come to your house. Follow the instructions and mail it back.  ? ? ? ? ? ? ? ?

## 2022-05-17 LAB — PE AND FLC, SERUM
A/G Ratio: 0.8 (ref 0.7–1.7)
Albumin ELP: 3 g/dL (ref 2.9–4.4)
Alpha 1: 0.5 g/dL — ABNORMAL HIGH (ref 0.0–0.4)
Alpha 2: 1 g/dL (ref 0.4–1.0)
Beta: 1 g/dL (ref 0.7–1.3)
Gamma Globulin: 1.5 g/dL (ref 0.4–1.8)
Globulin, Total: 3.9 g/dL (ref 2.2–3.9)
Ig Kappa Free Light Chain: 85.2 mg/L — ABNORMAL HIGH (ref 3.3–19.4)
Ig Lambda Free Light Chain: 37.2 mg/L — ABNORMAL HIGH (ref 5.7–26.3)
KAPPA/LAMBDA RATIO: 2.29 — ABNORMAL HIGH (ref 0.26–1.65)
Total Protein: 6.9 g/dL (ref 6.0–8.5)

## 2022-05-17 LAB — IMMUNOFIXATION, SERUM
IgA/Immunoglobulin A, Serum: 317 mg/dL (ref 61–437)
IgG (Immunoglobin G), Serum: 1707 mg/dL — ABNORMAL HIGH (ref 603–1613)
IgM (Immunoglobulin M), Srm: 146 mg/dL — ABNORMAL HIGH (ref 15–143)

## 2022-05-17 LAB — IMMUNOFIXATION, URINE

## 2022-05-21 ENCOUNTER — Encounter: Payer: Self-pay | Admitting: Physician Assistant

## 2022-05-21 ENCOUNTER — Encounter: Payer: Medicare Other | Admitting: Physician Assistant

## 2022-05-21 NOTE — Progress Notes (Signed)
This appointment was scheduled in error, patient was just seen by Dr. Audie Box and has a follow-up with Dr. Audie Box as well.  Today's appointment has been canceled.  I have discussed with the patient and his wife and I apologize for this mistake.   This encounter was created in error - please disregard.

## 2022-05-23 ENCOUNTER — Encounter: Payer: Self-pay | Admitting: Cardiovascular Disease

## 2022-05-26 ENCOUNTER — Telehealth: Payer: Self-pay | Admitting: Pharmacist Clinician (PhC)/ Clinical Pharmacy Specialist

## 2022-06-04 ENCOUNTER — Telehealth: Payer: Self-pay | Admitting: Cardiovascular Disease

## 2022-06-04 NOTE — Telephone Encounter (Signed)
Pt c/o medication issue:  1. Name of Medication: vindamax '61mg'$   2. How are you currently taking this medication (dosage and times per day)?   3. Are you having a reaction (difficulty breathing--STAT)?   4. What is your medication issue?  Cover My Meds is calling to get prior auth  Prior Auth # in Cover My Meds acct   [id number]  Having trouble uploading prior auth. Req call back to help this go through.

## 2022-06-10 NOTE — Telephone Encounter (Signed)
Prior authorization obtained and information sent to [facility] for patient assistance

## 2022-06-15 ENCOUNTER — Telehealth: Payer: Self-pay

## 2022-06-15 NOTE — Telephone Encounter (Signed)
Received information from [facility] (genetic testing)- patient is positive for TTR.  We will scan into chart. Patient has appointment 06/29 with Dr.O'Neal will discuss with him at this visit.  Thanks!

## 2022-06-22 ENCOUNTER — Ambulatory Visit: Payer: Medicare Other | Admitting: Cardiovascular Disease

## 2022-06-23 NOTE — H&P (View-Only) (Signed)
Cardiology Office Note:   Date:  06/24/2022  NAME:  Jordan [name].    MRN: [id number] DOB:  Jul 23, 1946   PCP:  [name], MD  Cardiologist:  Pixie Casino, MD  Electrophysiologist:  None   Referring MD: [name],*   Chief Complaint  Patient presents with   Follow-up        History of Present Illness:   Jordan [name]. is a 76 y.o. 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  We discussed his diagnosis in the office with his sister and brother-in-law.  We also discussed the role of genetic testing.  He is genetically positive.  His sister has had carpal tunnel surgery.  Likely needs to be screened for cardiac amyloidosis.  His volume status is acceptable.  Still getting short of breath with activity.  We discussed that his atrial fibrillation may be contributing in the setting of cardiac amyloidosis.  He has not missed any doses of Eliquis.  I have recommended to proceed with cardioversion and amiodarone therapy.  He is willing to do this.  Blood pressure acceptable today.  Taking Lasix as needed.  No volume overload.  He does have a son who is 51.  I informed him that I would be happy to see him in the office.  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 Persistent Afib  4. 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 Risa Grill   TRANSTHORACIC ECHOCARDIOGRAM  05/2006   EF ~09%; LV systolic function normal; mild focal basal septal hypertrophy; AV thickness mildly increased; LA mod-markedly dilate    Current Medications: Current Meds  Medication Sig   allopurinol (ZYLOPRIM) 100 MG tablet Take 1 tablet (100 mg total) by mouth daily.   amiodarone (PACERONE) 200 MG tablet Take 200 mg twice a day for 7 days, then decrease to 200 mg once daily   apixaban (ELIQUIS) 5 MG TABS tablet Take 1 tablet (5 mg total) by mouth 2 (two) times daily.   atorvastatin (LIPITOR) 40 MG tablet Take 1 tablet (40 mg total) by mouth daily.   Blood Pressure Monitor KIT Use as directed by your health care provider   carvedilol (COREG) 25 MG tablet Take 0.5 tablets (12.5 mg total) by mouth 2 (  two) times daily with a meal. TAKE 1 TABLET(25 MG) BY MOUTH TWICE DAILY WITH A MEAL   FARXIGA 10 MG TABS tablet Take 10 mg by mouth daily.   furosemide (LASIX) 20 MG tablet Take 1 tablet (20 mg total) by mouth daily as needed for fluid or edema.   losartan (COZAAR) 100 MG tablet Take 100 mg by mouth daily.     Allergies:    Patient has no known allergies.   Social History: Social History   Socioeconomic History   Marital status: Divorced    Spouse name: Not on file   Number of children: 1   Years of education: Not on file   Highest education level: Not on file  Occupational History   Occupation: retired    Fish farm manager: [employer]  Tobacco Use   Smoking status: Never   Smokeless  tobacco: Never  Vaping Use   Vaping Use: Never used  Substance and Sexual Activity   Alcohol use: Not Currently    Alcohol/week: 3.0 standard drinks of alcohol    Types: 3 Glasses of wine per week    Comment: occasional    Drug use: No   Sexual activity: Yes  Other Topics Concern   Not on file  Social History Narrative   Left Handed   Lives in a one story home   Drinks Caffeine - once a day    Social Determinants of Health   Financial Resource Strain: Not on file  Food Insecurity: Not on file  Transportation Needs: Not on file  Physical Activity: Not on file  Stress: Not on file  Social Connections: Not on file     Family History: The patient's family history includes Brain cancer in his mother; Colon cancer in his mother; Stroke in his father. There is no history of Rectal cancer, Stomach cancer, or Esophageal cancer.  ROS:   All other ROS reviewed and negative. Pertinent positives noted in the HPI.     EKGs/Labs/Other Studies Reviewed:   The following studies were personally reviewed by me today:  Recent Labs: 04/28/2022: ALT 27; B Natriuretic Peptide 772.8; Magnesium 2.0; TSH 1.486 04/29/2022: BUN 25; Creatinine, Ser 1.68; Hemoglobin 16.4; Platelets 240; Potassium 4.0; Sodium 138   Recent Lipid Panel    Component Value Date/Time   CHOL 150 04/28/2021 0446   CHOL 157 05/03/2018 1017   TRIG 51 04/28/2021 0446   HDL 37 (L) 04/28/2021 0446   HDL 55 05/03/2018 1017   CHOLHDL 4.1 04/28/2021 0446   VLDL 10 04/28/2021 0446   LDLCALC 103 (H) 04/28/2021 0446   LDLCALC 88 05/03/2018 1017    Physical Exam:   VS:  BP (!) 134/106   Pulse (!) 48   Ht 6' (1.829 m)   Wt 189 lb 3.2 oz (85.8 kg)   SpO2 98%   BMI 25.66 kg/m    Wt Readings from Last 3 Encounters:  06/24/22 189 lb 3.2 oz (85.8 kg)  05/21/22 186 lb 9.6 oz (84.6 kg)  05/11/22 186 lb 9.6 oz (84.6 kg)    General: Well nourished, well developed, in no acute distress Head: Atraumatic, normal size  Eyes:  PEERLA, EOMI  Neck: Supple, no JVD Endocrine: No thryomegaly Cardiac: Normal S1, S2; irregular rhythm Lungs: Clear to auscultation bilaterally, no wheezing, rhonchi or rales  Abd: Soft, nontender, no hepatomegaly  Ext: No edema, pulses 2+ Musculoskeletal: No deformities, BUE and BLE strength normal and equal Skin: Warm and dry, no rashes   Neuro: Alert  and oriented to person, place, time, and situation, CNII-XII grossly intact, no focal deficits  Psych: Normal mood and affect   ASSESSMENT:   Jordan [name]. is a 77 y.o. male who presents for the following: 1. Hereditary cardiac amyloidosis ([place])   2. Chronic systolic heart failure (HCC)   3. Persistent atrial fibrillation ([place])   4. Acquired thrombophilia ([place])     PLAN:   1. Hereditary cardiac amyloidosis ([place]) 2. Chronic systolic heart failure (HCC) -He has hereditary cardiac amyloidosis.  We have reached out to pharmacy regarding his tafamidis prescription.  Need to get him on this.  Euvolemic on exam.  Limited options for other medical therapy.  His ejection fraction is around 45%.  We will continue Coreg 12.5 mg twice daily as well as losartan 100 mg daily.  We can consider transition to [facility] and Aldactone.  We will be cautious with his kidney function.  He is on Farxiga 10 mg daily which she should continue.  He is taking Lasix as needed daily.  Euvolemic on examination.  Plan to pursue rhythm control with amiodarone as below.  3. Persistent atrial fibrillation (HCC) -Cardiac amyloidosis.  In A-fib.  Short of breath.  Suspect he is not tolerating A-fib.  No missed doses and Eliquis in the last 3 weeks.  We will set him up for cardioversion.  We will start amiodarone 200 mg twice daily for 7 days and then continue with 200 mg daily.  Hopefully this will hold him in rhythm.  He will continue Eliquis and he understands the importance of no missed doses.  4. Acquired thrombophilia ([place]) -AC as above   Shared Decision  Making/Informed Consent The risks (stroke, cardiac arrhythmias rarely resulting in the need for a temporary or permanent pacemaker, skin irritation or burns and complications associated with conscious sedation including aspiration, arrhythmia, respiratory failure and death), benefits (restoration of normal sinus rhythm) and alternatives of a direct current cardioversion were explained in detail to Mr. Grandville Silos and he agrees to proceed.    Disposition: Return in about 1 month (around 07/24/2022).  Medication Adjustments/Labs and Tests Ordered: Current medicines are reviewed at length with the patient today.  Concerns regarding medicines are outlined above.  Orders Placed This Encounter  Procedures   Basic metabolic panel   CBC   Meds ordered this encounter  Medications   amiodarone (PACERONE) 200 MG tablet    Sig: Take 200 mg twice a day for 7 days, then decrease to 200 mg once daily    Dispense:  97 tablet    Refill:  3    Patient Instructions  Medication Instructions:  START: Amiodarone 200 mg twice a day for 7 days, then decrease to 200 mg once a day thereafter.   *If you need a refill on your cardiac medications before your next appointment, please call your pharmacy*   Lab Work: Your physician recommends lab work today (CBC, BMP)  If you have labs (blood work) drawn today and your tests are completely normal, you will receive your results only by: [place] (if you have MyChart) OR A paper copy in the mail If you have any lab test that is abnormal or we need to change your treatment, we will call you to review the results.   Testing/Procedures: [facility]    Follow-Up: At [facility], you and your health needs are our priority.  As part of our continuing mission to provide you with exceptional heart care, we  have created designated Provider Care Teams.  These Care Teams include your primary Cardiologist (physician) and Advanced Practice  Providers (APPs -  Physician Assistants and Nurse Practitioners) who all work together to provide you with the care you need, when you need it.  We recommend signing up for the patient portal called "MyChart".  Sign up information is provided on this After Visit Summary.  MyChart is used to connect with patients for Virtual Visits (Telemedicine).  Patients are able to view lab/test results, encounter notes, upcoming appointments, etc.  Non-urgent messages can be sent to your provider as well.   To learn more about what you can do with MyChart, go to [email or web address].    Your next appointment:   4 week(s)  The format for your next appointment:   In Person  Provider:   Rogers Seeds, MD  You are scheduled for a Cardioversion on Wednesday 07/07/22  with Dr. Johney Frame.  Please arrive at the [facility] (Main Entrance A) at [facility]: [place], [place] [address] at 12 pm.  DIET: Nothing to eat or drink after midnight except a sip of water with medications (see medication instructions below)  FYI: For your safety, and to allow [place] to monitor your vital signs accurately during the surgery/procedure we request that   if you have artificial nails, gel coating, SNS etc. Please have those removed prior to your surgery/procedure. Not having the nail coverings /polish removed may result in cancellation or delay of your surgery/procedure.   Medication Instructions:  Continue your anticoagulant: Eliquis You will need to continue your anticoagulant after your procedure until you  are told by your  Provider that it is safe to stop   Labs:  Today (CBC, BMP)  You must have a responsible person to drive you home and stay in the waiting area during your procedure. Failure to do so could result in cancellation.  Bring your insurance cards.  *Special Note: Every effort is made to have your procedure done on time. Occasionally there are emergencies that occur at the hospital  that may cause delays. Please be patient if a delay does occur.     Time Spent with Patient: I have spent a total of 35 minutes with patient reviewing hospital notes, telemetry, EKGs, labs and examining the patient as well as establishing an assessment and plan that was discussed with the patient.  > 50% of time was spent in direct patient care.  Signed, Addison Naegeli. Audie Box, MD, [place]  [address], [place], [place] [address] [phone]  06/24/2022 5:01 PM

## 2022-06-23 NOTE — Progress Notes (Unsigned)
Cardiology Office Note:   Date:  06/24/2022  NAME:  Jordan [name].    MRN: [id number] DOB:  Jul 23, 1946   PCP:  [name], MD  Cardiologist:  Pixie Casino, MD  Electrophysiologist:  None   Referring MD: [name],*   Chief Complaint  Patient presents with   Follow-up        History of Present Illness:   Jordan [name]. is a 76 y.o. 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  We discussed his diagnosis in the office with his sister and brother-in-law.  We also discussed the role of genetic testing.  He is genetically positive.  His sister has had carpal tunnel surgery.  Likely needs to be screened for cardiac amyloidosis.  His volume status is acceptable.  Still getting short of breath with activity.  We discussed that his atrial fibrillation may be contributing in the setting of cardiac amyloidosis.  He has not missed any doses of Eliquis.  I have recommended to proceed with cardioversion and amiodarone therapy.  He is willing to do this.  Blood pressure acceptable today.  Taking Lasix as needed.  No volume overload.  He does have a son who is 51.  I informed him that I would be happy to see him in the office.  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 Persistent Afib  4. 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 Risa Grill   TRANSTHORACIC ECHOCARDIOGRAM  05/2006   EF ~09%; LV systolic function normal; mild focal basal septal hypertrophy; AV thickness mildly increased; LA mod-markedly dilate    Current Medications: Current Meds  Medication Sig   allopurinol (ZYLOPRIM) 100 MG tablet Take 1 tablet (100 mg total) by mouth daily.   amiodarone (PACERONE) 200 MG tablet Take 200 mg twice a day for 7 days, then decrease to 200 mg once daily   apixaban (ELIQUIS) 5 MG TABS tablet Take 1 tablet (5 mg total) by mouth 2 (two) times daily.   atorvastatin (LIPITOR) 40 MG tablet Take 1 tablet (40 mg total) by mouth daily.   Blood Pressure Monitor KIT Use as directed by your health care provider   carvedilol (COREG) 25 MG tablet Take 0.5 tablets (12.5 mg total) by mouth 2 (  two) times daily with a meal. TAKE 1 TABLET(25 MG) BY MOUTH TWICE DAILY WITH A MEAL   FARXIGA 10 MG TABS tablet Take 10 mg by mouth daily.   furosemide (LASIX) 20 MG tablet Take 1 tablet (20 mg total) by mouth daily as needed for fluid or edema.   losartan (COZAAR) 100 MG tablet Take 100 mg by mouth daily.     Allergies:    Patient has no known allergies.   Social History: Social History   Socioeconomic History   Marital status: Divorced    Spouse name: Not on file   Number of children: 1   Years of education: Not on file   Highest education level: Not on file  Occupational History   Occupation: retired    Fish farm manager: [employer]  Tobacco Use   Smoking status: Never   Smokeless  tobacco: Never  Vaping Use   Vaping Use: Never used  Substance and Sexual Activity   Alcohol use: Not Currently    Alcohol/week: 3.0 standard drinks of alcohol    Types: 3 Glasses of wine per week    Comment: occasional    Drug use: No   Sexual activity: Yes  Other Topics Concern   Not on file  Social History Narrative   Left Handed   Lives in a one story home   Drinks Caffeine - once a day    Social Determinants of Health   Financial Resource Strain: Not on file  Food Insecurity: Not on file  Transportation Needs: Not on file  Physical Activity: Not on file  Stress: Not on file  Social Connections: Not on file     Family History: The patient's family history includes Brain cancer in his mother; Colon cancer in his mother; Stroke in his father. There is no history of Rectal cancer, Stomach cancer, or Esophageal cancer.  ROS:   All other ROS reviewed and negative. Pertinent positives noted in the HPI.     EKGs/Labs/Other Studies Reviewed:   The following studies were personally reviewed by me today:  Recent Labs: 04/28/2022: ALT 27; B Natriuretic Peptide 772.8; Magnesium 2.0; TSH 1.486 04/29/2022: BUN 25; Creatinine, Ser 1.68; Hemoglobin 16.4; Platelets 240; Potassium 4.0; Sodium 138   Recent Lipid Panel    Component Value Date/Time   CHOL 150 04/28/2021 0446   CHOL 157 05/03/2018 1017   TRIG 51 04/28/2021 0446   HDL 37 (L) 04/28/2021 0446   HDL 55 05/03/2018 1017   CHOLHDL 4.1 04/28/2021 0446   VLDL 10 04/28/2021 0446   LDLCALC 103 (H) 04/28/2021 0446   LDLCALC 88 05/03/2018 1017    Physical Exam:   VS:  BP (!) 134/106   Pulse (!) 48   Ht 6' (1.829 m)   Wt 189 lb 3.2 oz (85.8 kg)   SpO2 98%   BMI 25.66 kg/m    Wt Readings from Last 3 Encounters:  06/24/22 189 lb 3.2 oz (85.8 kg)  05/21/22 186 lb 9.6 oz (84.6 kg)  05/11/22 186 lb 9.6 oz (84.6 kg)    General: Well nourished, well developed, in no acute distress Head: Atraumatic, normal size  Eyes:  PEERLA, EOMI  Neck: Supple, no JVD Endocrine: No thryomegaly Cardiac: Normal S1, S2; irregular rhythm Lungs: Clear to auscultation bilaterally, no wheezing, rhonchi or rales  Abd: Soft, nontender, no hepatomegaly  Ext: No edema, pulses 2+ Musculoskeletal: No deformities, BUE and BLE strength normal and equal Skin: Warm and dry, no rashes   Neuro: Alert  and oriented to person, place, time, and situation, CNII-XII grossly intact, no focal deficits  Psych: Normal mood and affect   ASSESSMENT:   Daryll Spisak. is a 77 y.o. male who presents for the following: 1. Hereditary cardiac amyloidosis ([place])   2. Chronic systolic heart failure (HCC)   3. Persistent atrial fibrillation ([place])   4. Acquired thrombophilia ([place])     PLAN:   1. Hereditary cardiac amyloidosis ([place]) 2. Chronic systolic heart failure (HCC) -He has hereditary cardiac amyloidosis.  We have reached out to pharmacy regarding his tafamidis prescription.  Need to get him on this.  Euvolemic on exam.  Limited options for other medical therapy.  His ejection fraction is around 45%.  We will continue Coreg 12.5 mg twice daily as well as losartan 100 mg daily.  We can consider transition to [facility] and Aldactone.  We will be cautious with his kidney function.  He is on Farxiga 10 mg daily which she should continue.  He is taking Lasix as needed daily.  Euvolemic on examination.  Plan to pursue rhythm control with amiodarone as below.  3. Persistent atrial fibrillation (HCC) -Cardiac amyloidosis.  In A-fib.  Short of breath.  Suspect he is not tolerating A-fib.  No missed doses and Eliquis in the last 3 weeks.  We will set him up for cardioversion.  We will start amiodarone 200 mg twice daily for 7 days and then continue with 200 mg daily.  Hopefully this will hold him in rhythm.  He will continue Eliquis and he understands the importance of no missed doses.  4. Acquired thrombophilia ([place]) -AC as above   Shared Decision  Making/Informed Consent The risks (stroke, cardiac arrhythmias rarely resulting in the need for a temporary or permanent pacemaker, skin irritation or burns and complications associated with conscious sedation including aspiration, arrhythmia, respiratory failure and death), benefits (restoration of normal sinus rhythm) and alternatives of a direct current cardioversion were explained in detail to Mr. Grandville Silos and he agrees to proceed.    Disposition: Return in about 1 month (around 07/24/2022).  Medication Adjustments/Labs and Tests Ordered: Current medicines are reviewed at length with the patient today.  Concerns regarding medicines are outlined above.  Orders Placed This Encounter  Procedures   Basic metabolic panel   CBC   Meds ordered this encounter  Medications   amiodarone (PACERONE) 200 MG tablet    Sig: Take 200 mg twice a day for 7 days, then decrease to 200 mg once daily    Dispense:  97 tablet    Refill:  3    Patient Instructions  Medication Instructions:  START: Amiodarone 200 mg twice a day for 7 days, then decrease to 200 mg once a day thereafter.   *If you need a refill on your cardiac medications before your next appointment, please call your pharmacy*   Lab Work: Your physician recommends lab work today (CBC, BMP)  If you have labs (blood work) drawn today and your tests are completely normal, you will receive your results only by: [place] (if you have MyChart) OR A paper copy in the mail If you have any lab test that is abnormal or we need to change your treatment, we will call you to review the results.   Testing/Procedures: [facility]    Follow-Up: At [facility], you and your health needs are our priority.  As part of our continuing mission to provide you with exceptional heart care, we  have created designated Provider Care Teams.  These Care Teams include your primary Cardiologist (physician) and Advanced Practice  Providers (APPs -  Physician Assistants and Nurse Practitioners) who all work together to provide you with the care you need, when you need it.  We recommend signing up for the patient portal called "MyChart".  Sign up information is provided on this After Visit Summary.  MyChart is used to connect with patients for Virtual Visits (Telemedicine).  Patients are able to view lab/test results, encounter notes, upcoming appointments, etc.  Non-urgent messages can be sent to your provider as well.   To learn more about what you can do with MyChart, go to [email or web address].    Your next appointment:   4 week(s)  The format for your next appointment:   In Person  Provider:   Rogers Seeds, MD  You are scheduled for a Cardioversion on Wednesday 07/07/22  with Dr. Johney Frame.  Please arrive at the [facility] (Main Entrance A) at [facility]: [place], [place] [address] at 12 pm.  DIET: Nothing to eat or drink after midnight except a sip of water with medications (see medication instructions below)  FYI: For your safety, and to allow [place] to monitor your vital signs accurately during the surgery/procedure we request that   if you have artificial nails, gel coating, SNS etc. Please have those removed prior to your surgery/procedure. Not having the nail coverings /polish removed may result in cancellation or delay of your surgery/procedure.   Medication Instructions:  Continue your anticoagulant: Eliquis You will need to continue your anticoagulant after your procedure until you  are told by your  Provider that it is safe to stop   Labs:  Today (CBC, BMP)  You must have a responsible person to drive you home and stay in the waiting area during your procedure. Failure to do so could result in cancellation.  Bring your insurance cards.  *Special Note: Every effort is made to have your procedure done on time. Occasionally there are emergencies that occur at the hospital  that may cause delays. Please be patient if a delay does occur.     Time Spent with Patient: I have spent a total of 35 minutes with patient reviewing hospital notes, telemetry, EKGs, labs and examining the patient as well as establishing an assessment and plan that was discussed with the patient.  > 50% of time was spent in direct patient care.  Signed, Addison Naegeli. Audie Box, MD, [place]  [address], [place],  [address] [phone]  06/24/2022 5:01 PM

## 2022-06-24 ENCOUNTER — Ambulatory Visit: Payer: Medicare Other | Admitting: Cardiovascular Disease

## 2022-06-24 ENCOUNTER — Encounter: Payer: Self-pay | Admitting: Cardiovascular Disease

## 2022-06-24 VITALS — BP 134/106 | HR 48 | Ht 72.0 in | Wt 189.2 lb

## 2022-06-24 DIAGNOSIS — D6869 Other thrombophilia: Secondary | ICD-10-CM

## 2022-06-24 DIAGNOSIS — I5022 Chronic systolic (congestive) heart failure: Secondary | ICD-10-CM | POA: Diagnosis not present

## 2022-06-24 DIAGNOSIS — E854 Organ-limited amyloidosis: Secondary | ICD-10-CM | POA: Diagnosis not present

## 2022-06-24 DIAGNOSIS — I4819 Other persistent atrial fibrillation: Secondary | ICD-10-CM

## 2022-06-24 DIAGNOSIS — I43 Cardiomyopathy in diseases classified elsewhere: Secondary | ICD-10-CM

## 2022-06-24 MED ORDER — AMIODARONE HCL 200 MG PO TABS: ORAL_TABLET | ORAL | 3 refills | Status: DC

## 2022-06-24 NOTE — Patient Instructions (Signed)
Medication Instructions:  START: Amiodarone 200 mg twice a day for 7 days, then decrease to 200 mg once a day thereafter.   *If you need a refill on your cardiac medications before your next appointment, please call your pharmacy*   Lab Work: Your physician recommends lab work today (CBC, BMP)  If you have labs (blood work) drawn today and your tests are completely normal, you will receive your results only by: [place] (if you have MyChart) OR A paper copy in the mail If you have any lab test that is abnormal or we need to change your treatment, we will call you to review the results.   Testing/Procedures: [facility]    Follow-Up: At [facility], you and your health needs are our priority.  As part of our continuing mission to provide you with exceptional heart care, we have created designated Provider Care Teams.  These Care Teams include your primary Cardiologist (physician) and Advanced Practice Providers (APPs -  Physician Assistants and Nurse Practitioners) who all work together to provide you with the care you need, when you need it.  We recommend signing up for the patient portal called "MyChart".  Sign up information is provided on this After Visit Summary.  MyChart is used to connect with patients for Virtual Visits (Telemedicine).  Patients are able to view lab/test results, encounter notes, upcoming appointments, etc.  Non-urgent messages can be sent to your provider as well.   To learn more about what you can do with MyChart, go to [email or web address].    Your next appointment:   4 week(s)  The format for your next appointment:   In Person  Provider:   Rogers Seeds, MD  You are scheduled for a Cardioversion on Wednesday 07/07/22  with Dr. Johney Frame.  Please arrive at the [facility] (Main Entrance A) at [facility]: [place], [place] [address] at 12 pm.  DIET: Nothing to eat or drink after midnight except a  sip of water with medications (see medication instructions below)  FYI: For your safety, and to allow [place] to monitor your vital signs accurately during the surgery/procedure we request that   if you have artificial nails, gel coating, SNS etc. Please have those removed prior to your surgery/procedure. Not having the nail coverings /polish removed may result in cancellation or delay of your surgery/procedure.   Medication Instructions:  Continue your anticoagulant: Eliquis You will need to continue your anticoagulant after your procedure until you  are told by your  Provider that it is safe to stop   Labs:  Today (CBC, BMP)  You must have a responsible person to drive you home and stay in the waiting area during your procedure. Failure to do so could result in cancellation.  Bring your insurance cards.  *Special Note: Every effort is made to have your procedure done on time. Occasionally there are emergencies that occur at the hospital that may cause delays. Please be patient if a delay does occur.

## 2022-06-25 LAB — CBC
Hematocrit: 46.8 % (ref 37.5–51.0)
Hemoglobin: 15 g/dL (ref 13.0–17.7)
MCH: 28.4 pg (ref 26.6–33.0)
MCHC: 32.1 g/dL (ref 31.5–35.7)
MCV: 89 fL (ref 79–97)
Platelets: 206 10*3/uL (ref 150–450)
RBC: 5.29 x10E6/uL (ref 4.14–5.80)
RDW: 14 % (ref 11.6–15.4)
WBC: 6.3 10*3/uL (ref 3.4–10.8)

## 2022-06-25 LAB — BASIC METABOLIC PANEL
BUN/Creatinine Ratio: 15 (ref 10–24)
BUN: 22 mg/dL (ref 8–27)
CO2: 18 mmol/L — ABNORMAL LOW (ref 20–29)
Calcium: 9.1 mg/dL (ref 8.6–10.2)
Chloride: 109 mmol/L — ABNORMAL HIGH (ref 96–106)
Creatinine, Ser: 1.45 mg/dL — ABNORMAL HIGH (ref 0.76–1.27)
Glucose: 88 mg/dL (ref 70–99)
Potassium: 4.9 mmol/L (ref 3.5–5.2)
Sodium: 140 mmol/L (ref 134–144)
eGFR: 50 mL/min/{1.73_m2} — ABNORMAL LOW (ref >59)

## 2022-07-01 ENCOUNTER — Encounter (HOSPITAL_COMMUNITY): Payer: Self-pay | Admitting: Cardiology

## 2022-07-07 ENCOUNTER — Encounter (HOSPITAL_COMMUNITY)
Admission: RE | Disposition: A | Discharge status: Home / Self Care | Payer: Self-pay | Source: Home / Self Care | Attending: Cardiology

## 2022-07-07 ENCOUNTER — Ambulatory Visit (HOSPITAL_BASED_OUTPATIENT_CLINIC_OR_DEPARTMENT_OTHER): Payer: Medicare Other | Admitting: Certified Registered"

## 2022-07-07 ENCOUNTER — Ambulatory Visit (HOSPITAL_COMMUNITY): Payer: Medicare Other | Admitting: Certified Registered"

## 2022-07-07 ENCOUNTER — Ambulatory Visit (HOSPITAL_COMMUNITY)
Admission: RE | Admit: 2022-07-07 | Discharge: 2022-07-07 | Disposition: A | Discharge status: Home / Self Care | Payer: Medicare Other | Attending: Cardiology | Admitting: Cardiology

## 2022-07-07 ENCOUNTER — Other Ambulatory Visit: Payer: Self-pay

## 2022-07-07 ENCOUNTER — Encounter (HOSPITAL_COMMUNITY): Payer: Self-pay | Admitting: Cardiology

## 2022-07-07 DIAGNOSIS — I491 Atrial premature depolarization: Secondary | ICD-10-CM

## 2022-07-07 DIAGNOSIS — I110 Hypertensive heart disease with heart failure: Secondary | ICD-10-CM

## 2022-07-07 DIAGNOSIS — I493 Ventricular premature depolarization: Secondary | ICD-10-CM | POA: Diagnosis not present

## 2022-07-07 DIAGNOSIS — E854 Organ-limited amyloidosis: Secondary | ICD-10-CM | POA: Insufficient documentation

## 2022-07-07 DIAGNOSIS — I4891 Unspecified atrial fibrillation: Secondary | ICD-10-CM

## 2022-07-07 DIAGNOSIS — I43 Cardiomyopathy in diseases classified elsewhere: Secondary | ICD-10-CM | POA: Insufficient documentation

## 2022-07-07 DIAGNOSIS — I5022 Chronic systolic (congestive) heart failure: Secondary | ICD-10-CM | POA: Insufficient documentation

## 2022-07-07 DIAGNOSIS — Z86718 Personal history of other venous thrombosis and embolism: Secondary | ICD-10-CM | POA: Diagnosis not present

## 2022-07-07 DIAGNOSIS — Z79899 Other long term (current) drug therapy: Secondary | ICD-10-CM | POA: Diagnosis not present

## 2022-07-07 DIAGNOSIS — I4819 Other persistent atrial fibrillation: Secondary | ICD-10-CM

## 2022-07-07 DIAGNOSIS — N1832 Chronic kidney disease, stage 3b: Secondary | ICD-10-CM | POA: Diagnosis not present

## 2022-07-07 DIAGNOSIS — D6859 Other primary thrombophilia: Secondary | ICD-10-CM | POA: Diagnosis not present

## 2022-07-07 DIAGNOSIS — I130 Hypertensive heart and chronic kidney disease with heart failure and stage 1 through stage 4 chronic kidney disease, or unspecified chronic kidney disease: Secondary | ICD-10-CM | POA: Insufficient documentation

## 2022-07-07 DIAGNOSIS — Z8546 Personal history of malignant neoplasm of prostate: Secondary | ICD-10-CM | POA: Diagnosis not present

## 2022-07-07 HISTORY — PX: CARDIOVERSION: SHX1299

## 2022-07-07 SURGERY — CARDIOVERSION
Anesthesia: General

## 2022-07-07 MED ORDER — SODIUM CHLORIDE 0.9 % IV SOLN: INTRAVENOUS | Status: DC

## 2022-07-07 MED ORDER — LIDOCAINE 2% (20 MG/ML) 5 ML SYRINGE
INTRAMUSCULAR | Status: DC | PRN
  Administered 2022-07-07: 40 mg via INTRAVENOUS

## 2022-07-07 MED ORDER — CARVEDILOL 25 MG PO TABS: 12.5000 mg | ORAL_TABLET | Freq: Two times a day (BID) | ORAL | 0 refills | Status: DC

## 2022-07-07 MED ORDER — PROPOFOL 10 MG/ML IV BOLUS
INTRAVENOUS | Status: DC | PRN
  Administered 2022-07-07: 100 mg via INTRAVENOUS

## 2022-07-07 NOTE — Interval H&P Note (Signed)
History and Physical Interval Note:  07/07/2022 2:11 PM  Jordan [name].  has presented today for surgery, with the diagnosis of AFIB.  The various methods of treatment have been discussed with the patient and family. After consideration of risks, benefits and other options for treatment, the patient has consented to  Procedure(s): CARDIOVERSION (N/A) as a surgical intervention.  The patient's history has been reviewed, patient examined, no change in status, stable for surgery.  I have reviewed the patient's chart and labs.  Questions were answered to the patient's satisfaction.     Freada Bergeron

## 2022-07-07 NOTE — Anesthesia Postprocedure Evaluation (Signed)
Anesthesia Post Note  Patient: Jordan [name].  Procedure(s) Performed: CARDIOVERSION     Patient location during evaluation: Endoscopy Anesthesia Type: General Level of consciousness: awake and alert, patient cooperative and oriented Pain management: pain level controlled Vital Signs Assessment: post-procedure vital signs reviewed and stable Respiratory status: spontaneous breathing, nonlabored ventilation and respiratory function stable Cardiovascular status: blood pressure returned to baseline and stable Postop Assessment: no apparent nausea or vomiting and able to ambulate Anesthetic complications: no   No notable events documented.  Last Vitals:  Vitals:   07/07/22 1510 07/07/22 1515  BP: 131/80   Pulse: (!) 39 (!) 43  Resp: 14 15  Temp:    SpO2: 99% 98%    Last Pain:  Vitals:   07/07/22 1515  TempSrc:   PainSc: 0-No pain                 Zacharias Ridling,E. Adisa Litt

## 2022-07-07 NOTE — CV Procedure (Signed)
Procedure: Electrical Cardioversion Indications:  Atrial Fibrillation  Procedure Details:  Consent: Risks of procedure as well as the alternatives and risks of each were explained to the (patient/caregiver).  Consent for procedure obtained.  Time Out: Verified patient identification, verified procedure, site/side was marked, verified correct patient position, special equipment/implants available, medications/allergies/relevent history reviewed, required imaging and test results available. PERFORMED.  Patient placed on cardiac monitor, pulse oximetry, supplemental oxygen as necessary.  Sedation given:  Propofol '100mg'$   Pacer pads placed anterior and posterior chest.  Cardioverted 1 time(s).  Cardioversion with synchronized biphasic 150J shock.  Evaluation: Findings: Post procedure EKG shows:  Sinus bradycardia with frequent PACs and PVCs Complications: None Patient did tolerate procedure well.  Time Spent Directly with the Patient:  66mnutes   HFreada Bergeron7/11/2022, 2:50 PM

## 2022-07-07 NOTE — Anesthesia Preprocedure Evaluation (Addendum)
Anesthesia Evaluation  Patient identified by MRN, date of birth, ID band Patient awake    Reviewed: Allergy & Precautions, NPO status , Patient's Chart, lab work & pertinent test results, reviewed documented beta blocker date and time   History of Anesthesia Complications Negative for: history of anesthetic complications  Airway Mallampati: II  TM Distance: >3 FB Neck ROM: Full    Dental  (+) Chipped, Missing, Dental Advisory Given   Pulmonary neg pulmonary ROS,    breath sounds clear to auscultation       Cardiovascular hypertension, Pt. on home beta blockers and Pt. on medications (-) angina+CHF and + DVT  + dysrhythmias (on eliquis) Atrial Fibrillation + Valvular Problems/Murmurs (mild/mod AI) AI  Rhythm:Irregular Rate:Normal  TTE 2023 1. Left ventricular ejection fraction, by estimation, is 40 to 45%. The  left ventricle has mildly decreased function. The left ventricle  demonstrates global hypokinesis. There is severe concentric left  ventricular hypertrophy.  2. Right ventricular systolic function is moderately reduced. The right  ventricular size is mildly enlarged. Moderately increased right  ventricular wall thickness.  3. Left atrial size was severely dilated.  4. Right atrial size was severely dilated.  5. The mitral valve is normal in structure. Mild mitral valve  regurgitation.  6. Aortic valve regurgitation is mild to moderate.  7. Consider work-up for amyloidosis given biventricular dysfunction,  severe LVH, severe biatrial enlargement if clinically indicated.    Neuro/Psych negative neurological ROS  negative psych ROS   GI/Hepatic negative GI ROS, (+) Hepatitis -, C  Endo/Other  negative endocrine ROS  Renal/GU Renal InsufficiencyRenal disease  negative genitourinary   Musculoskeletal negative musculoskeletal ROS (+)   Abdominal   Peds  Hematology eliquis   Anesthesia Other  Findings Prostate cancer  Reproductive/Obstetrics                           Anesthesia Physical Anesthesia Plan  ASA: 3  Anesthesia Plan: General   Post-op Pain Management:    Induction: Intravenous  PONV Risk Score and Plan: Propofol infusion and Treatment may vary due to age or medical condition  Airway Management Planned: Natural Airway and Mask  Additional Equipment: None  Intra-op Plan:   Post-operative Plan:   Informed Consent: I have reviewed the patients History and Physical, chart, labs and discussed the procedure including the risks, benefits and alternatives for the proposed anesthesia with the patient or authorized representative who has indicated his/her understanding and acceptance.     Dental advisory given  Plan Discussed with: CRNA and Surgeon  Anesthesia Plan Comments:       Anesthesia Quick Evaluation

## 2022-07-07 NOTE — Discharge Instructions (Addendum)
Electrical Cardioversion  Electrical cardioversion is the delivery of a jolt of electricity to restore a normal rhythm to the heart. A rhythm that is too fast or is not regular keeps the heart from pumping well. In this procedure, sticky patches or metal paddles are placed on the chest to deliver electricity to the heart from a device.  If this information does not answer your questions, please call [place] office at [phone] to clarify.   STOP COREG/CARVEDILOL PER DR ONEAL  Follow these instructions at home: You may have some redness on the skin where the shocks were given.  You may apply over-the-counter hydrocortisone cream or aloe vera to alleviate skin irritation. YOU SHOULD NOT DRIVE, use power tools, machinery or perform tasks that involve climbing or major physical exertion for 24 hours (because of the sedation medicines used during the test).  Take over-the-counter and prescription medicines only as told by your health care provider. Ask your health care provider how to check your pulse. Check it often. Rest for 48 hours after the procedure or as told by your health care provider. Avoid or limit your caffeine use as told by your health care provider. Keep all follow-up visits as told by your health care provider. This is important.  FOLLOW UP:  Please also call with any specific questions about appointments or follow up tests.

## 2022-07-07 NOTE — Transfer of Care (Signed)
Immediate Anesthesia Transfer of Care Note  Patient: Jordan [name].  Procedure(s) Performed: CARDIOVERSION  Patient Location: PACU and Endoscopy Unit  Anesthesia Type:General  Level of Consciousness: awake, oriented and drowsy  Airway & Oxygen Therapy: Patient Spontanous Breathing  Post-op Assessment: Report given to RN and Post -op Vital signs reviewed and stable  Post vital signs: Reviewed and stable  Last Vitals:  Vitals Value Taken Time  BP 130/85 07/07/22 1444  Temp 36.3 C 07/07/22 1444  Pulse 45 07/07/22 1446  Resp 20 07/07/22 1446  SpO2 97 % 07/07/22 1446  Vitals shown include unvalidated device data.  Last Pain:  Vitals:   07/07/22 1444  TempSrc: Temporal  PainSc:          Complications: No notable events documented.

## 2022-07-08 ENCOUNTER — Encounter (HOSPITAL_COMMUNITY): Payer: Self-pay | Admitting: Cardiology

## 2022-07-14 ENCOUNTER — Telehealth: Payer: Self-pay | Admitting: Cardiovascular Disease

## 2022-07-14 NOTE — Telephone Encounter (Signed)
Called patient, advised that I had not seen any paperwork to be signed. Patient states that they told him he was approved, and what the copay would be.   I advised I would check with the PHARMD that helps me with this to see if she knows anything- should we just send a prescription to them?   Thanks!

## 2022-07-14 NOTE — Telephone Encounter (Signed)
Pt c/o medication issue:  1. Name of Medication: vindamax '61mg'$   2. How are you currently taking this medication (dosage and times per day)?   3. Are you having a reaction (difficulty breathing--STAT)? no  4. What is your medication issue? Pt called stating that his PA has been approved and they are waiting on Dr. Audie Box to sign off on it so it can be sent to the pharmacy. Please advise.

## 2022-07-15 ENCOUNTER — Other Ambulatory Visit (HOSPITAL_BASED_OUTPATIENT_CLINIC_OR_DEPARTMENT_OTHER): Payer: Self-pay | Admitting: Pharmacist Clinician (PhC)/ Clinical Pharmacy Specialist

## 2022-07-15 MED ORDER — VYNDAMAX 61 MG PO CAPS: 60.0000 mg | ORAL_CAPSULE | Freq: Every day | ORAL | 12 refills | Status: DC

## 2022-07-15 NOTE — Telephone Encounter (Signed)
Rx sent to [facility]).

## 2022-07-15 NOTE — Telephone Encounter (Signed)
Vyndamax rx sent to [facility] specialty pharmacy.

## 2022-07-21 ENCOUNTER — Encounter: Payer: Self-pay | Admitting: Internal Medicine

## 2022-07-21 ENCOUNTER — Ambulatory Visit: Payer: Medicare Other | Admitting: Internal Medicine

## 2022-07-21 VITALS — BP 158/92 | HR 89 | Ht 72.0 in | Wt 193.2 lb

## 2022-07-21 DIAGNOSIS — Z8673 Personal history of transient ischemic attack (TIA), and cerebral infarction without residual deficits: Secondary | ICD-10-CM | POA: Diagnosis not present

## 2022-07-21 DIAGNOSIS — I4819 Other persistent atrial fibrillation: Secondary | ICD-10-CM

## 2022-07-21 DIAGNOSIS — I43 Cardiomyopathy in diseases classified elsewhere: Secondary | ICD-10-CM | POA: Diagnosis not present

## 2022-07-21 DIAGNOSIS — I428 Other cardiomyopathies: Secondary | ICD-10-CM | POA: Diagnosis not present

## 2022-07-21 DIAGNOSIS — E854 Organ-limited amyloidosis: Secondary | ICD-10-CM | POA: Diagnosis not present

## 2022-07-21 NOTE — Patient Instructions (Signed)
Medication Instructions:  NO CHANGES right now - we may call you with updates on medications.  Dr. Debara Pickett is going to discuss with Dr. Audie Box    *If you need a refill on your cardiac medications before your next appointment, please call your pharmacy*   Follow-Up: At [facility], you and your health needs are our priority.  As part of our continuing mission to provide you with exceptional heart care, we have created designated Provider Care Teams.  These Care Teams include your primary Cardiologist (physician) and Advanced Practice Providers (APPs -  Physician Assistants and Nurse Practitioners) who all work together to provide you with the care you need, when you need it.  We recommend signing up for the patient portal called "MyChart".  Sign up information is provided on this After Visit Summary.  MyChart is used to connect with patients for Virtual Visits (Telemedicine).  Patients are able to view lab/test results, encounter notes, upcoming appointments, etc.  Non-urgent messages can be sent to your provider as well.   To learn more about what you can do with MyChart, go to [email or web address].    Your next appointment:   We will contact you when your next visit is to be scheduled

## 2022-07-21 NOTE — Progress Notes (Signed)
OFFICE NOTE  Chief Complaint:  Follow-up afib  Primary Care Physician: [name], MD  HPI:  Jordan [name]. is a 76 year old gentleman with history of paroxysmal A-fib and DVT in the past, as well as hypertension. He has been on Coumadin. He recently had some left lower extremity swelling and underwent Doppler ultrasound. This did not demonstrate any DVT; however, there was no evaluation for superficial reflux. 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 He also has paroxysmal A-fib with no real recurrence recently and he is chronically anticoagulated on Coumadin. His INR had been 3.6. Medication was held and then his INR was then subtherapeutic, restarted and now is at 3.6 today. Unfortunately, I think the dose of Coumadin he is on at 10 mg is just too much for him. He has a history of hypertension which has not been bothersome and he noted that his swelling has come down since he saw our PA about 6 or 7 days ago. He has as a primary care provider who drew some fluid off of his knee. He was fitted with compression stockings and reports marked improvement of his swelling. 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 His INR checked today shows excellent control on his current regimen which is actually a fairly high dose of warfarin at an INR of 2.5.  I had the pleasure seeing Jordan [name] back today. He has not followed up since September 2014. His last INR check was September 2015, which is about 6 months ago. He says that with his work he is not able to get regular INR checks, but we discussed at length today the importance of monthly INR monitoring and the dangers of not following his INR closely. His INR was checked in the office and was elevated today at 3.7. He's had high levels in the past is well. We discussed options including switching him to a NOAC  or possibly arranging for him to get blood work near work and send those values in for [place] to adjust. He tells [place] that he is actually going to retire in May and we may be able to get him through until that time. He denies any chest pain or worsening shortness of breath.  01/07/2017  Jordan [name] returns today for follow-up. His blood pressure is notably elevated 146/100. Heart rate is not controlled at 119. He has been somewhat compliant with his INRs however I did not see him since March 2016. He says that the cost is an issue with follow-up appointments. Despite his elevated heart rate with A. fib he is not had an echocardiogram that I can see in the recent past. He denies any chest pain but does get some shortness of breath with exertion.  02/15/2017  Jordan [name] returns back today for follow-up. Heart rate is now better controlled at 56 however he remains in atrial fibrillation. He underwent an echocardiogram does show a mildly reduced LV EF of 45-50% and severe left atrial enlargement. I reviewed a number of his EKGs from the past indicating he's probably been in persistent or long-standing persistent atrial fibrillation since 2014. Based on this, and given his severe lower atrial enlargement, it is unlikely for [place] to reestablish sinus rhythm unless we use antiarrhythmic medication. He seems to be unaware of his A. fib and has NYHA class 1-2 heart failure symptoms. I would recommend  further medical therapy for congestive heart failure at this time.  5/18/018  Jordan [name] returns for follow-up today. He is without complaints. He remains in A. fib and heart rate is elevated today 116. He says he's not very compliant with twice-daily carvedilol dosing but is willing to work on it. Blood pressure is also elevated today to recheck was not much improved. His INR was checked in warfarin was adjusted by pharmacy. She denies any chest pain or shortness of breath.  05/03/2018  Jordan [name] is seen today for  annual follow-up.  He reports has been out of his warfarin for about 3 days.  He was up in [place] with an ailing family member.  He did not realize he could get a refill of his prescription there.  His INR was low today.  We will further adjust that.  He is also been out of some of his blood pressure medicines which she refilled and just recently started taking.  Blood pressure is notably elevated today 154/104.  He has rate controlled A. fib.  He was noted to have severe atrial enlargement which make it unlikely for him to establish sinus rhythm with cardioversion.  06/13/2018  Jordan [name] returns today for follow-up.  I readjusted his medications a few months ago when he seen some improvement in his symptoms.  His INR was slightly off today and readjusted by her pharmacist Cyril Mourning.  Blood pressure is very difficult to auscultate.  Initially was 140/76 and we measured about 136/92.  Weight is down 3 pounds.  Again he seems to be tolerating medications.  He will need LV reassessment in 6 months.  11/24/2020  Jordan [name] is seen today in follow-up.  I saw him last year via telemedicine visit.  Overall he seems to be doing well.  He is followed in our anticoagulation clinic.  He continues to want to stay on warfarin after offering to switch to a direct oral anticoagulant.  He is lost a little weight and made some dietary changes.  Recently had gout and was taken off of HCTZ and had an increase in his losartan.  He is also now on low-dose allopurinol.  Overall he is doing much better.  INRs have been a little elevated around 3.5.  Blood pressure was initially elevated today however came down to 128/82.  He denies any chest pain or worsening shortness of breath.  His last echo in 2019 showed normalization of LVEF up to 60 to 65%.  03/29/2022  Jordan [name] is seen today for follow-up A-fib.  Its been actually several years since I saw him.  He has been previously followed by our advanced practice providers.   Specifically back in May of last year he had an echocardiogram.  At the time he was found to be in atrial fibrillation and his LVEF had dropped to 45 to 50%.  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  He was then arranged for cardioversion with me which I performed in October 2022.  He did successfully convert to sinus rhythm however he was noted to have severe biatrial enlargement on echo and ultimately went back into A-fib.  Subsequently he was seen number of times by Almyra Deforest, PA-C and ultimately he has had some issues with ongoing fatigue and intermittent tachycardia.  It was thought this was possibly due to poor rate control because he is persistently in A-fib but however he underwent monitoring which  showed a wide-complex tachycardia that I personally reviewed and is concerning for ventricular tachycardia.  He was then scheduled to see me today.  He reports other than being persistently fatigued and getting shortness of breath, he denies any chest pain.  He says he can no longer ride his bicycle which he used to do for several 100 miles.  07/21/2022  Jordan [name] returns today for follow-up.  Since I last saw him he has been seen several times by Dr. Audie Box.  There was concern about a possible cardiac amyloid based on his echo findings.  He did undergo a PYP scan which was notably positive.  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  He is then been referred to start on tafamidis which was ordered but he has not received it yet.  He also underwent recent attempted cardioversion.  He was seen in April by Dr. Lovena Le and felt to be at very low likelihood of maintaining sinus rhythm however he was loaded on amiodarone and underwent cardioversion which was initially successful in July but returns today and is noted to be back in atrial fibrillation.  He was taken off of carvedilol  because of this and blood pressure is noted to be elevated today.  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[place])    history of    Personal history of colonic polyps    Prostate cancer [facility])    radical prostatectomy     Past Surgical History:  Procedure Laterality Date   CARDIAC CATHETERIZATION  2007   no occlusive CAD   CARDIOVERSION N/A 10/07/2021   Procedure: CARDIOVERSION;  Surgeon: Pixie Casino, MD;  Location: [facility];  Service: Cardiovascular;  Laterality: N/A;   CARDIOVERSION N/A 07/07/2022   Procedure: CARDIOVERSION;  Surgeon: Freada Bergeron, MD;  Location: [place];  Service: Cardiovascular;  Laterality: N/A;   COLONOSCOPY  2002,2007   [name]   PROSTATECTOMY     PROSTATECTOMY  04-04   Dr. Risa Grill   TRANSTHORACIC ECHOCARDIOGRAM  05/2006   EF ~51%; LV systolic function normal; mild focal basal septal hypertrophy; AV thickness mildly increased; LA mod-markedly dilate    FAMHx:  Family History  Problem Relation Age of Onset   Brain cancer Mother        brain tumor   Colon cancer Mother    Stroke Father    Rectal cancer Neg Hx    Stomach cancer Neg Hx    Esophageal cancer Neg Hx     SOCHx:   reports that he has never smoked. He has never used smokeless tobacco. He reports that he does not currently use alcohol after a past usage of about 3.0 standard drinks of alcohol per week. He reports that he does not use drugs.  ALLERGIES:  No Known Allergies  ROS: Pertinent items noted in HPI and remainder of comprehensive ROS otherwise negative.  HOME MEDS: Current Outpatient Medications  Medication  Sig Dispense Refill   allopurinol (ZYLOPRIM) 100 MG tablet Take 1 tablet (100  mg total) by mouth daily. 30 tablet 0   amiodarone (PACERONE) 200 MG tablet Take 200 mg twice a day for 7 days, then decrease to 200 mg once daily 97 tablet 3   apixaban (ELIQUIS) 5 MG TABS tablet Take 1 tablet (5 mg total) by mouth 2 (two) times daily. 60 tablet 0   atorvastatin (LIPITOR) 40 MG tablet Take 1 tablet (40 mg total) by mouth daily. 30 tablet 0   Blood Pressure Monitor KIT Use as directed by your health care provider 1 kit 0   FARXIGA 10 MG TABS tablet Take 10 mg by mouth daily.     furosemide (LASIX) 20 MG tablet Take 1 tablet (20 mg total) by mouth daily as needed for fluid or edema.     losartan (COZAAR) 100 MG tablet Take 100 mg by mouth daily.     Tafamidis (VYNDAMAX) 61 MG CAPS Take 60 mg by mouth daily. 30 capsule 12   No current facility-administered medications for this visit.    LABS/IMAGING: No results found for this or any previous visit (from the past 48 hour(s)).  No results found.  VITALS: BP (!) 158/92   Pulse 89   Ht 6' (1.829 m)   Wt 193 lb 3.2 oz (87.6 kg)   SpO2 99%   BMI 26.20 kg/m   EXAM: General appearance: alert and no distress Neck: no carotid bruit and no JVD Lungs: clear to auscultation bilaterally Heart: irregularly irregular rhythm Abdomen: soft, non-tender; bowel sounds normal; no masses,  no organomegaly Extremities: extremities normal, atraumatic, no cyanosis or edema Pulses: 2+ and symmetric Skin: Skin color, texture, turgor normal. 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   Jordan [name] was found to have ATTR amyloid genetically confirmed.  He is to start tafamidis but is waiting medication from the drug company.  He unfortunately went back into atrial fibrillation/flutter after recent cardioversion on amiodarone.  He was taken off of carvedilol to allow this but his blood pressure remains elevated.  Since he is persistently in atrial fibrillation, I am not sure of the utility of continuing with amiodarone.  I would favor going back to carvedilol for the heart failure benefit and discontinuing the amiodarone and considering him to be longstanding persistent if not eventually permanent atrial fibrillation with a rate control strategy.  He has had A-fib on and off since 2004.  I will reach out to Dr. Audie Box to make sure we have agreement on this plan.  I do not think he needs to see both of [place] going forward I am certainly happy to have him follow-up with Dr. Audie Box if he wishes.  He has a follow-up appt with him in 2 weeks.  Pixie Casino, MD, [facility] [place] Director of the Advanced Lipid Disorders &  Cardiovascular Risk Reduction Clinic Diplomate of the American Board of Clinical Lipidology Attending Cardiologist  Direct Dial: [phone]  Fax: [phone]  Website:  [email or web address]   Nadean Corwin Cary Wilford 07/21/2022, 3:10 PM

## 2022-07-22 ENCOUNTER — Telehealth: Payer: Self-pay | Admitting: Internal Medicine

## 2022-07-22 NOTE — Telephone Encounter (Signed)
Pixie Casino, MD  O'Neal, Cassie Freer, MD; Fidel Levy, RN Ok thanks - [place], can you stop Mr. Biven's amiodarone and restart coreg 12.5 mg BID?   He will follow with Dr. Audie Box going forward.   -[place]        Previous Messages    ----- Message -----  From: Geralynn Rile, MD  Sent: 07/21/2022   6:27 PM EDT  To: Pixie Casino, MD   I gave it a shot to get him in rhythm. Not gonna happen. Agree with coreg. I can see him moving forward. I think stopping amio is the right thing.   -W  ----- Message -----  From: Pixie Casino, MD  Sent: 07/21/2022   5:15 PM EDT  To: Geralynn Rile, MD   Wes  -thanks for taking care of Mr. Grandville Silos.  He has not yet started tafamidis.  He is awaiting shipment.  Unfortunately he is back in A-fib.  He is on amiodarone and his carvedilol was stopped for this.  Blood pressure is elevated.  I would favor stopping the amiodarone and going back to carvedilol as it was already mentioned by Dr. Lovena Le that it is very unlikely he will remain in sinus rhythm.  I think the carvedilol would be more helpful for blood pressure and his heart failure.  He has an appointment with you on August 8.  If you wish to continue to follow him I am happy to transfer him to your care. Whatever works best. Let me know what you want to do with the amiodarone.  Thanks, [place]

## 2022-07-22 NOTE — Telephone Encounter (Signed)
Patient called with advice from MD(s). He verbalized understanding of plan. He does not need carvedilol refill at this time as he has at home. Med list updated.

## 2022-07-30 NOTE — Progress Notes (Signed)
Cardiology Office Note:   Date:  08/03/2022  NAME:  Jordan [name].    MRN: [id number] DOB:  July 07, 1946   PCP:  [name], MD  Cardiologist:  Pixie Casino, MD  Electrophysiologist:  None   Referring MD: [name],*   Chief Complaint  Patient presents with   Follow-up   History of Present Illness:   Jordan Darley. is a 76 y.o. male with a hx of hereditary amyloidosis, HFpEF, persistent A-fib who presents for follow-up.  He is back in atrial fibrillation.  Do not believe he will maintains sinus rhythm.  He was on amiodarone but transition back to carvedilol.  We will just continue with rate control strategy.  He is up 8 pounds since our last appointment.  He has evidence of volume overload.  We discussed going back on torsemide for 3 days.  He will do this.  He is still not received his tafamidis.  We will reach out to pharmacy to determine what is going on.  Kidney function is stable.  Blood pressure is stable.  He is having some balance issues.  We discussed physical therapy.  He also needs to see a kidney doctor which she will do.  No bleeding on Eliquis.  Denies any chest pain or trouble breathing.  Overall doing quite well.  We also discussed that we will be more than happy to see his son as well as sister given his diagnosis of hereditary cardiac amyloidosis.  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 Persistent Afib  -DCCV 07/07/2022 -> ERAF 07/21/2022 4. 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[place])     history of    Personal history of colonic polyps    Prostate cancer [facility])    radical prostatectomy     Past Surgical History: Past Surgical History:  Procedure Laterality Date   CARDIAC CATHETERIZATION  2007   no occlusive CAD   CARDIOVERSION N/A 10/07/2021   Procedure: CARDIOVERSION;  Surgeon: Pixie Casino, MD;  Location: [place];  Service: Cardiovascular;  Laterality: N/A;   CARDIOVERSION N/A 07/07/2022   Procedure: CARDIOVERSION;  Surgeon: Freada Bergeron, MD;  Location: [place];  Service: Cardiovascular;  Laterality: N/A;   COLONOSCOPY  2002,2007   [name]   PROSTATECTOMY     PROSTATECTOMY  04-04   Dr. Risa Grill   TRANSTHORACIC ECHOCARDIOGRAM  05/2006   EF ~32%; LV systolic function normal; mild focal basal septal hypertrophy; AV thickness mildly increased; LA mod-markedly dilate    Current Medications: Current Meds  Medication Sig   allopurinol (ZYLOPRIM) 100 MG tablet Take 1 tablet (100 mg total) by mouth daily.   apixaban (ELIQUIS) 5 MG TABS tablet Take 1 tablet (5 mg total) by mouth 2 (two) times daily.   atorvastatin (LIPITOR) 40 MG tablet Take 1 tablet (40 mg total) by mouth daily.   Blood Pressure Monitor KIT Use as directed by your health care provider   carvedilol (  COREG) 12.5 MG tablet Take 12.5 mg by mouth 2 (two) times daily with a meal.   FARXIGA 10 MG TABS tablet Take 10 mg by mouth daily.   furosemide (LASIX) 20 MG tablet Take 1 tablet (20 mg total) by mouth daily as needed for fluid or edema.   losartan (COZAAR) 100 MG tablet Take 100 mg by mouth daily.     Allergies:    Patient has no known allergies.   Social History: Social History   Socioeconomic History   Marital status: Divorced    Spouse name: Not on file   Number of children: 1   Years of education: Not on file   Highest education level: Not on file  Occupational History   Occupation: retired    Fish farm manager: [employer]  Tobacco Use   Smoking status: Never    Smokeless tobacco: Never  Vaping Use   Vaping Use: Never used  Substance and Sexual Activity   Alcohol use: Not Currently    Alcohol/week: 3.0 standard drinks of alcohol    Types: 3 Glasses of wine per week    Comment: occasional    Drug use: No   Sexual activity: Yes  Other Topics Concern   Not on file  Social History Narrative   Left Handed   Lives in a one story home   Drinks Caffeine - once a day    Social Determinants of Health   Financial Resource Strain: Not on file  Food Insecurity: Not on file  Transportation Needs: Not on file  Physical Activity: Not on file  Stress: Not on file  Social Connections: Not on file     Family History: The patient's family history includes Brain cancer in his mother; Colon cancer in his mother; Stroke in his father. There is no history of Rectal cancer, Stomach cancer, or Esophageal cancer.  ROS:   All other ROS reviewed and negative. Pertinent positives noted in the HPI.     EKGs/Labs/Other Studies Reviewed:   The following studies were personally reviewed by me today:  Recent Labs: 04/28/2022: ALT 27; B Natriuretic Peptide 772.8; Magnesium 2.0; TSH 1.486 06/24/2022: BUN 22; Creatinine, Ser 1.45; Hemoglobin 15.0; Platelets 206; Potassium 4.9; Sodium 140   Recent Lipid Panel    Component Value Date/Time   CHOL 150 04/28/2021 0446   CHOL 157 05/03/2018 1017   TRIG 51 04/28/2021 0446   HDL 37 (L) 04/28/2021 0446   HDL 55 05/03/2018 1017   CHOLHDL 4.1 04/28/2021 0446   VLDL 10 04/28/2021 0446   LDLCALC 103 (H) 04/28/2021 0446   LDLCALC 88 05/03/2018 1017    Physical Exam:   VS:  BP 128/82   Pulse (!) 40   Ht 6' (1.829 m)   Wt 201 lb 12.8 oz (91.5 kg)   SpO2 99%   BMI 27.37 kg/m    Wt Readings from Last 3 Encounters:  08/03/22 201 lb 12.8 oz (91.5 kg)  07/21/22 193 lb 3.2 oz (87.6 kg)  07/07/22 189 lb (85.7 kg)    General: Well nourished, well developed, in no acute distress Head: Atraumatic, normal size  Eyes:  PEERLA, EOMI  Neck: Supple, no JVD Endocrine: No thryomegaly Cardiac: Normal S1, S2; irregular rhythm, no murmurs rubs or gallops Lungs: Clear to auscultation bilaterally, no wheezing, rhonchi or rales  Abd: Soft, nontender, no hepatomegaly  Ext: 2+ pitting edema up to the mid shins Musculoskeletal: No deformities, BUE and BLE strength normal and equal Skin: Warm and dry, no  rashes   Neuro: Alert and oriented to person, place, time, and situation, CNII-XII grossly intact, no focal deficits  Psych: Normal mood and affect   ASSESSMENT:   Jordan Rog. is a 76 y.o. male who presents for the following: 1. Hereditary cardiac amyloidosis (HCC)   2. Persistent atrial fibrillation ([place])   3. Acquired thrombophilia ([place])   4. Chronic systolic heart failure (HCC)   5. Imbalance   6. Chronic renal failure, stage 3 (moderate), unspecified whether stage 3a or 3b CKD ([place])     PLAN:   1. Hereditary cardiac amyloidosis (HCC) -Hereditary cardiac amyloidosis.  Gene positive.  We have ordered tafamidis.  He has not received this.  I will reach out to pharmacy to determine what is going on.  For his congestive heart failure he is volume up.  He will take torsemide 20 mg for the next 3 days and then return to taking this daily as needed.  He is consuming high salty meals.  We discussed this.  He was given information on salt reduction.  He needs to work on this as this is a big contributor to his volume.  2. Persistent atrial fibrillation ([place]) 3. Acquired thrombophilia ([place]) -Failed cardioversion despite amiodarone.  He has significant left atrial dilation.  I do not believe he will maintain sinus rhythm.  We will just continue with rate control strategy.  This is more important for him to be on tafamidis.  We need to halt the progression of his cardiac amyloidosis.  He also may be a candidate for an future.    4. Chronic systolic heart failure (HCC) -Ejection fraction 45%.  Currently on  carvedilol 12.5 mg twice daily.  On losartan 100 mg daily.  He is also on Farxiga 10 mg daily.  Increase torsemide to 20 mg daily for 3 days and then daily as needed after that.  Given his kidney dysfunction are not believe he is a good candidate for Aldactone.  I do not believe he will tolerate Entresto given his cardiac amyloidosis.  We will continue with current regimen.  5. Imbalance -Referral to PT  6. Chronic renal failure, stage 3 (moderate), unspecified whether stage 3a or 3b CKD ([place]) -Referral to nephrology.  Disposition: Return in about 3 months (around 11/03/2022).  Medication Adjustments/Labs and Tests Ordered: Current medicines are reviewed at length with the patient today.  Concerns regarding medicines are outlined above.  Orders Placed This Encounter  Procedures   Ambulatory referral to Physical Therapy   Ambulatory referral to Nephrology   No orders of the defined types were placed in this encounter.   Patient Instructions  Medication Instructions:  Take Torsemide 3 days in a row, then decrease back to as needed.  *If you need a refill on your cardiac medications before your next appointment, please call your pharmacy*  Follow-Up: At [facility], you and your health needs are our priority.  As part of our continuing mission to provide you with exceptional heart care, we have created designated Provider Care Teams.  These Care Teams include your primary Cardiologist (physician) and Advanced Practice Providers (APPs -  Physician Assistants and Nurse Practitioners) who all work together to provide you with the care you need, when you need it.  We recommend signing up for the patient portal called "MyChart".  Sign up information is provided on this After Visit Summary.  MyChart is used to connect with patients for Virtual Visits (Telemedicine).  Patients are able to view lab/test  results, encounter notes, upcoming appointments, etc.  Non-urgent messages can be sent to your  provider as well.   To learn more about what you can do with MyChart, go to [email or web address].    Your next appointment:   3 month(s)  The format for your next appointment:   In Person  Provider:   Eleonore Chiquito, MD   Other Instructions PT referral- they will contact you for information. Nephrology referral- they will contact you for an appointment.          Time Spent with Patient: I have spent a total of 25 minutes with patient reviewing hospital notes, telemetry, EKGs, labs and examining the patient as well as establishing an assessment and plan that was discussed with the patient.  > 50% of time was spent in direct patient care.  Signed, Addison Naegeli. Audie Box, MD, [place]  [address], [place], [place] [address] [phone]  08/03/2022 10:30 AM

## 2022-08-03 ENCOUNTER — Ambulatory Visit: Payer: Medicare Other | Admitting: Cardiovascular Disease

## 2022-08-03 ENCOUNTER — Encounter: Payer: Self-pay | Admitting: Cardiovascular Disease

## 2022-08-03 ENCOUNTER — Other Ambulatory Visit (HOSPITAL_COMMUNITY): Payer: Self-pay

## 2022-08-03 VITALS — BP 128/82 | HR 40 | Ht 72.0 in | Wt 201.8 lb

## 2022-08-03 DIAGNOSIS — E854 Organ-limited amyloidosis: Secondary | ICD-10-CM

## 2022-08-03 DIAGNOSIS — I5022 Chronic systolic (congestive) heart failure: Secondary | ICD-10-CM | POA: Diagnosis not present

## 2022-08-03 DIAGNOSIS — R2689 Other abnormalities of gait and mobility: Secondary | ICD-10-CM

## 2022-08-03 DIAGNOSIS — I4819 Other persistent atrial fibrillation: Secondary | ICD-10-CM | POA: Diagnosis not present

## 2022-08-03 DIAGNOSIS — D6869 Other thrombophilia: Secondary | ICD-10-CM | POA: Diagnosis not present

## 2022-08-03 DIAGNOSIS — N183 Chronic kidney disease, stage 3 unspecified: Secondary | ICD-10-CM

## 2022-08-03 DIAGNOSIS — I43 Cardiomyopathy in diseases classified elsewhere: Secondary | ICD-10-CM

## 2022-08-03 NOTE — Patient Instructions (Signed)
Medication Instructions:  Take Torsemide 3 days in a row, then decrease back to as needed.  *If you need a refill on your cardiac medications before your next appointment, please call your pharmacy*  Follow-Up: At [facility], you and your health needs are our priority.  As part of our continuing mission to provide you with exceptional heart care, we have created designated Provider Care Teams.  These Care Teams include your primary Cardiologist (physician) and Advanced Practice Providers (APPs -  Physician Assistants and Nurse Practitioners) who all work together to provide you with the care you need, when you need it.  We recommend signing up for the patient portal called "MyChart".  Sign up information is provided on this After Visit Summary.  MyChart is used to connect with patients for Virtual Visits (Telemedicine).  Patients are able to view lab/test results, encounter notes, upcoming appointments, etc.  Non-urgent messages can be sent to your provider as well.   To learn more about what you can do with MyChart, go to [email or web address].    Your next appointment:   3 month(s)  The format for your next appointment:   In Person  Provider:   Eleonore Chiquito, MD   Other Instructions PT referral- they will contact you for information. Nephrology referral- they will contact you for an appointment.

## 2022-08-05 ENCOUNTER — Telehealth: Payer: Self-pay | Admitting: Pharmacist Clinician (PhC)/ Clinical Pharmacy Specialist

## 2022-08-05 ENCOUNTER — Other Ambulatory Visit (HOSPITAL_COMMUNITY): Payer: Self-pay

## 2022-08-05 MED ORDER — VYNDAMAX 61 MG PO CAPS
61.0000 mg | ORAL_CAPSULE | Freq: Every day | ORAL | 11 refills | Status: DC
  Filled 2022-08-05: qty 30, fill #0
  Filled 2022-09-03: qty 30, 30d supply, fill #0
  Filled 2022-10-07: qty 30, 30d supply, fill #1
  Filled 2022-11-03: qty 30, 30d supply, fill #2
  Filled 2023-02-08: qty 30, 30d supply, fill #3
  Filled 2023-07-06 – 2023-07-13 (×2): qty 30, 30d supply, fill #4

## 2022-08-05 NOTE — Telephone Encounter (Signed)
Vyndamax 61 mg #30 is $101.70/month.  Will send rx to [facility]

## 2022-08-13 ENCOUNTER — Other Ambulatory Visit (HOSPITAL_COMMUNITY): Payer: Self-pay

## 2022-09-02 ENCOUNTER — Other Ambulatory Visit: Payer: Self-pay

## 2022-09-02 DIAGNOSIS — R2689 Other abnormalities of gait and mobility: Secondary | ICD-10-CM

## 2022-09-02 DIAGNOSIS — E854 Organ-limited amyloidosis: Secondary | ICD-10-CM

## 2022-09-02 NOTE — Telephone Encounter (Signed)
Hey, you wanted me to check with pharmacy about his medication- this message from [facility] was placed on 08/10- looks like it would be $101.70 a month.

## 2022-09-03 ENCOUNTER — Other Ambulatory Visit (HOSPITAL_COMMUNITY): Payer: Self-pay

## 2022-09-03 NOTE — Telephone Encounter (Signed)
Contacted patient sister- advised medications were sent to [facility].   Patient sister, verbalized understanding.

## 2022-09-09 ENCOUNTER — Ambulatory Visit: Payer: Medicare Other | Attending: Cardiovascular Disease

## 2022-09-09 DIAGNOSIS — R2681 Unsteadiness on feet: Secondary | ICD-10-CM | POA: Diagnosis present

## 2022-09-09 DIAGNOSIS — R2689 Other abnormalities of gait and mobility: Secondary | ICD-10-CM | POA: Insufficient documentation

## 2022-09-09 DIAGNOSIS — M6281 Muscle weakness (generalized): Secondary | ICD-10-CM | POA: Diagnosis present

## 2022-09-09 NOTE — Therapy (Signed)
OUTPATIENT PHYSICAL THERAPY NEURO EVALUATION   Patient Name: Jordan [name]. 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[place])    history of    Personal history of colonic polyps    Prostate cancer ([place])    radical prostatectomy    Past Surgical History:  Procedure Laterality Date   CARDIAC CATHETERIZATION  2007   no occlusive CAD   CARDIOVERSION N/A 10/07/2021   Procedure: CARDIOVERSION;  Surgeon: Pixie Casino, MD;  Location: [place];  Service: Cardiovascular;  Laterality: N/A;   CARDIOVERSION N/A 07/07/2022   Procedure: CARDIOVERSION;  Surgeon: Freada Bergeron, MD;  Location: [facility] ENDOSCOPY;  Service: Cardiovascular;  Laterality: N/A;   COLONOSCOPY  2002,2007   [name]   PROSTATECTOMY     PROSTATECTOMY  04-04   Dr. Risa Grill   TRANSTHORACIC ECHOCARDIOGRAM  05/2006   EF ~16%; LV systolic function normal; mild focal basal septal hypertrophy; AV thickness mildly increased; LA mod-markedly dilate   Patient Active Problem List   Diagnosis Date Noted   Hypotension 04/28/2022   NSVT (nonsustained ventricular tachycardia) ([place]) 04/12/2022   Stage 3a chronic kidney disease ([place])    Chronic hepatitis C without hepatic coma ([place])    History of gout    Substance abuse ([place])    Right thalamic infarction ([place]) 04/27/2021   Personal history of colonic polyps 01/16/2019   Family history of colon cancer in mother 01/16/2019   Screening for lipid disorders 05/03/2018   NICM  (nonischemic cardiomyopathy) ([place]) 96/78/9381   Chronic systolic heart failure ([place]) 02/15/2017   Dyspnea 01/07/2017   Essential hypertension 01/07/2017   Persistent atrial fibrillation ([place]) 03/15/2013   Long term (current) use of anticoagulants 03/15/2013   History of prostate cancer 02/16/2013    ONSET DATE: "some time"  REFERRING DIAG: hx of hereditary amyloidosis, HFpEF, persistent A-fib   THERAPY DIAG:  No diagnosis found.  Rationale for Evaluation and Treatment Rehabilitation  SUBJECTIVE:  SUBJECTIVE STATEMENT: Had a stroke April of 2022, noticed that he has been having a lot of balance issues. Notices imbalance when walking and with navigating busy/dark environments. Reports cataract surgery 6-8 months ago and vision has been improved since this and uses corrective lenses. Notices when driving and he turns his head it feels like it takes a moment for eyes to catch up with movement.   Pt accompanied by: self  PERTINENT HISTORY: cardiac PMH  PAIN:  Are you having pain? No  PRECAUTIONS: None  WEIGHT BEARING RESTRICTIONS No  FALLS: Has patient fallen in last 6 months? No  LIVING ENVIRONMENT: Lives with: lives alone Lives in: House/apartment Stairs: No Has following equipment at home: Single point cane  PLOF: Independent  PATIENT GOALS improve stamina and balance  OBJECTIVE:   DIAGNOSTIC FINDINGS: cardiac conditions monitored and diagnosed  COGNITION: Overall cognitive status: Within functional limits for tasks assessed   SENSATION: WFL  COORDINATION: WNL  EDEMA:  none  MUSCLE TONE: WNL   MUSCLE LENGTH: DNT  DTRs:    POSTURE: rounded shoulders and forward head  LOWER EXTREMITY ROM:     WNL  LOWER EXTREMITY MMT:    4/5 gross BLE strength  BED  MOBILITY:    TRANSFERS: Assistive device utilized: None  Sit to stand: Complete Independence Stand to sit: Complete Independence Chair to chair: Complete Independence Floor:  DNT    STAIRS:  Level of Assistance: Modified independence  Stair Negotiation Technique: Alternating Pattern  with Single Rail on Right  Number of Stairs: 10   Height of Stairs: 4-6"  Comments:   GAIT: Gait pattern: WFL Distance walked:  Assistive device utilized: None Level of assistance: Complete Independence Comments:   FUNCTIONAL TESTs:  5 times sit to stand: 20.56 Dynamic Gait Index: 13/24 Berg Balance Test: NT  M-CTSIB  Condition 1: Firm Surface, EO 30 Sec, Normal Sway  Condition 2: Firm Surface, EC 30 Sec, Mild and Moderate Sway  Condition 3: Foam Surface, EO 30 Sec, Normal Sway  Condition 4: Foam Surface, EC 30 Sec, Moderate Sway   Vestibular assessment: Saccades WNL Smooth pursuits WNL VOR x1 (slow and with some speed) WNL VOR cancellation WNL Head Impulse Test: negative right, positive left (slight) corrective saccade  PATIENT SURVEYS:      PATIENT EDUCATION: Education details: assessment findings, HEP initiation Person educated: Patient Education method: Explanation and Handouts Education comprehension: verbalized understanding and needs further education   HOME EXERCISE PROGRAM: Access Code: [email or web address] URL: [email or web address] Date: 09/09/2022 Prepared by: Sherlyn Lees  Exercises - Corner Balance Feet Together With Eyes Open  - 1 x daily - 7 x weekly - 3 sets - 15-30 sec hold - Corner Balance Feet Together With Eyes Closed  - 1 x daily - 7 x weekly - 3-5 sets - 15-30 hold - Corner Balance Feet Together: Eyes Open With Head Turns  - 1 x daily - 7 x weekly - 5-10 reps - Semi-Tandem Corner Balance With Eyes Closed  - 1 x daily - 7 x weekly - 3-5 sets - 15-30 hold    GOALS: Goals reviewed with patient? Yes  SHORT TERM GOALS: Target date:  09/30/2022  Patient will be independent in HEP to improve functional outcomes Baseline: Goal status: INITIAL    LONG TERM GOALS: Target date: 10/21/2022  Demo low risk for falls per 19/24 Dynamic Gait Index Baseline: 13/24 Goal status: INITIAL  2.  Manifest improved BLE strength and reduced risk for falls per 15 sec 5xSTS Baseline:  20 sec Goal status: INITIAL  3.  Manifest low risk for falls per score 52/56 Berg Balance to improve safety with mobility and ADL Baseline: NT Goal status: INITIAL   ASSESSMENT:  CLINICAL IMPRESSION: Patient is a 76 y.o. gentleman who was seen today for physical therapy evaluation and treatment for imbalance. Demonstrates high risk for falls per standardized assessment measures and generalized BLE weakness per 5xSTS test.  Exhibits difficulty with certain vestibular-biased movements/positions also indicating deficits in achieving and maintaining equilibrium.  Pt would benefit from PT services to address deficits and limitations to reduce risk for falls and improve functional activity tolerance.    OBJECTIVE IMPAIRMENTS cardiopulmonary status limiting activity, decreased activity tolerance, decreased balance, decreased endurance, decreased strength, dizziness, improper body mechanics, and postural dysfunction.   ACTIVITY LIMITATIONS lifting, standing, stairs, and locomotion level  PARTICIPATION LIMITATIONS: meal prep, cleaning, community activity, and yard work  PERSONAL FACTORS Age, Time since onset of injury/illness/exacerbation, and 1-2 comorbidities: cardiac conditions  are also affecting patient's functional outcome.   REHAB POTENTIAL: Good  CLINICAL DECISION MAKING: Evolving/moderate complexity  EVALUATION COMPLEXITY: Moderate  PLAN: PT FREQUENCY: 1-2x/week  PT DURATION: 6 weeks  PLANNED INTERVENTIONS: Therapeutic exercises, Therapeutic activity, Neuromuscular re-education, Balance training, Gait training, Patient/Family education, Self  Care, Joint mobilization, Stair training, Vestibular training, Canalith repositioning, DME instructions, Electrical stimulation, and Manual therapy  PLAN FOR NEXT SESSION: Berg Balance, strength HEP   5:49 PM, 09/09/22 M. Sherlyn Lees, PT, DPT Physical Therapist- [place] Office Number: [phone]

## 2022-09-13 ENCOUNTER — Ambulatory Visit: Payer: Medicare Other

## 2022-09-13 DIAGNOSIS — R2681 Unsteadiness on feet: Secondary | ICD-10-CM | POA: Diagnosis not present

## 2022-09-13 DIAGNOSIS — M6281 Muscle weakness (generalized): Secondary | ICD-10-CM

## 2022-09-13 DIAGNOSIS — R2689 Other abnormalities of gait and mobility: Secondary | ICD-10-CM

## 2022-09-13 NOTE — Therapy (Signed)
OUTPATIENT PHYSICAL THERAPY NEURO TREATMENT   Patient Name: Jordan [name]. MRN: [id number] DOB:1946/06/13, 76 y.o., male 71 Date: 09/13/2022   PCP: [name] REFERRING PROVIDER: Geralynn Rile, MD    PT End of Session - 09/13/22 [phone]     Visit Number 2    Number of Visits 6    Date for PT Re-Evaluation 10/21/22    Authorization Type BCBS Medicare    Progress Note Due on Visit 10    PT Start Time 0930    PT Stop Time 0962    PT Time Calculation (min) 45 min    Activity Tolerance Patient tolerated treatment well    Behavior During Therapy WFL for tasks assessed/performed             Past Medical History:  Diagnosis Date   Atrial fib/flutter, transient    Hx   Atrial fibrillation ([place])    coumadin   Colon cancer ([place])    family hx   Diverticulosis    Gout    Hemorrhoids    Hepatitis C    History of DVT (deep vein thrombosis)    History of nuclear stress test 04/2002   exercise; normal study    Hypertension    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[place])    history of    Personal history of colonic polyps    Prostate cancer ([place])    radical prostatectomy    Past Surgical History:  Procedure Laterality Date   CARDIAC CATHETERIZATION  2007   no occlusive CAD   CARDIOVERSION N/A 10/07/2021   Procedure: CARDIOVERSION;  Surgeon: Pixie Casino, MD;  Location: [facility];  Service: Cardiovascular;  Laterality: N/A;   CARDIOVERSION N/A 07/07/2022   Procedure: CARDIOVERSION;  Surgeon: Freada Bergeron, MD;  Location: [facility] ENDOSCOPY;  Service: Cardiovascular;  Laterality: N/A;   COLONOSCOPY  2002,2007   [name]   PROSTATECTOMY     PROSTATECTOMY  04-04   Dr. Risa Grill   TRANSTHORACIC ECHOCARDIOGRAM  05/2006   EF ~83%; LV systolic function normal; mild focal basal septal hypertrophy; AV thickness mildly increased; LA mod-markedly dilate   Patient Active Problem List   Diagnosis Date Noted   Hypotension 04/28/2022   NSVT  (nonsustained ventricular tachycardia) ([place]) 04/12/2022   Stage 3a chronic kidney disease ([place])    Chronic hepatitis C without hepatic coma ([place])    History of gout    Substance abuse ([place])    Right thalamic infarction ([place]) 04/27/2021   Personal history of colonic polyps 01/16/2019   Family history of colon cancer in mother 01/16/2019   Screening for lipid disorders 05/03/2018   NICM (nonischemic cardiomyopathy) ([place]) 66/29/4765   Chronic systolic heart failure ([place]) 02/15/2017   Dyspnea 01/07/2017   Essential hypertension 01/07/2017   Persistent atrial fibrillation ([place]) 03/15/2013   Long term (current) use of anticoagulants 03/15/2013   History of prostate cancer 02/16/2013    ONSET DATE: "some time"  REFERRING DIAG: hx of hereditary amyloidosis, HFpEF, persistent A-fib   THERAPY DIAG:  Unsteadiness on feet  Other abnormalities of gait and mobility  Muscle weakness (generalized)  Rationale for Evaluation and Treatment Rehabilitation  SUBJECTIVE:  SUBJECTIVE STATEMENT: Had company come and stay over the weekend and feeling pretty tired.  Pt accompanied by: self  PERTINENT HISTORY: cardiac PMH  PAIN:  Are you having pain? No  PRECAUTIONS: None  WEIGHT BEARING RESTRICTIONS No  FALLS: Has patient fallen in last 6 months? No  LIVING ENVIRONMENT: Lives with: lives alone Lives in: House/apartment Stairs: No Has following equipment at home: Single point cane  PLOF: Independent  PATIENT GOALS improve stamina and balance  OBJECTIVE:   TODAY'S TREATMENT: 09/13/22 Activity Comments  Dynamic balance warm-up 4x20 ft -high knees march -sidestepping -retro-walking -tandem walk  Standing balance -feet together: EO/EC x 15 sec; head turns EO/EC 5x -semi-tandem EO/EC x 15 sec   Sit-stand 3x5 reps 10 lbs Goblet hold  Farmer's carry 4x20 ft 10#  Standing on foam EO/EC x 15 sec Head turns EO/EC x5   Seated VOR No abnormality noted, no issue reported    DIAGNOSTIC FINDINGS: cardiac conditions monitored and diagnosed  COGNITION: Overall cognitive status: Within functional limits for tasks assessed   SENSATION: WFL  COORDINATION: WNL  EDEMA:  none  MUSCLE TONE: WNL   MUSCLE LENGTH: DNT  DTRs:    POSTURE: rounded shoulders and forward head  LOWER EXTREMITY ROM:     WNL  LOWER EXTREMITY MMT:    4/5 gross BLE strength  BED MOBILITY:    TRANSFERS: Assistive device utilized: None  Sit to stand: Complete Independence Stand to sit: Complete Independence Chair to chair: Complete Independence Floor:  DNT    STAIRS:  Level of Assistance: Modified independence  Stair Negotiation Technique: Alternating Pattern  with Single Rail on Right  Number of Stairs: 10   Height of Stairs: 4-6"  Comments:   GAIT: Gait pattern: WFL Distance walked:  Assistive device utilized: None Level of assistance: Complete Independence Comments:   FUNCTIONAL TESTs:  5 times sit to stand: 20.56 Dynamic Gait Index: 13/24 Berg Balance Test: NT  M-CTSIB  Condition 1: Firm Surface, EO 30 Sec, Normal Sway  Condition 2: Firm Surface, EC 30 Sec, Mild and Moderate Sway  Condition 3: Foam Surface, EO 30 Sec, Normal Sway  Condition 4: Foam Surface, EC 30 Sec, Moderate Sway   Vestibular assessment: Saccades WNL Smooth pursuits WNL VOR x1 (slow and with some speed) WNL VOR cancellation WNL Head Impulse Test: negative right, positive left (slight) corrective saccade  PATIENT SURVEYS:      PATIENT EDUCATION: Education details: assessment findings, HEP initiation Person educated: Patient Education method: Explanation and Handouts Education comprehension: verbalized understanding and needs further education   HOME EXERCISE PROGRAM: Access Code:  [email or web address] URL: [email or web address] Date: 09/09/2022 Prepared by: Sherlyn Lees  Exercises - Corner Balance Feet Together With Eyes Open  - 1 x daily - 7 x weekly - 3 sets - 15-30 sec hold - Corner Balance Feet Together With Eyes Closed  - 1 x daily - 7 x weekly - 3-5 sets - 15-30 hold - Corner Balance Feet Together: Eyes Open With Head Turns  - 1 x daily - 7 x weekly - 5-10 reps - Semi-Tandem Corner Balance With Eyes Closed  - 1 x daily - 7 x weekly - 3-5 sets - 15-30 hold - Goblet Squat with Kettlebell  - 1 x daily - 7 x weekly - 5 sets - 5 reps - Farmer's Walk  - 1 x daily - 7 x weekly - 3 sets - 10 reps   GOALS: Goals reviewed with patient? Yes  SHORT TERM GOALS: Target date:  09/30/2022  Patient will be independent in HEP to improve functional outcomes Baseline: Goal status: INITIAL    LONG TERM GOALS: Target date: 10/21/2022  Demo low risk for falls per 19/24 Dynamic Gait Index Baseline: 13/24 Goal status: INITIAL  2.  Manifest improved BLE strength and reduced risk for falls per 15 sec 5xSTS Baseline: 20 sec Goal status: INITIAL  3.  Manifest low risk for falls per score 52/56 Berg Balance to improve safety with mobility and ADL Baseline: NT Goal status: INITIAL   ASSESSMENT:  CLINICAL IMPRESSION: Demonstrates difficulty with tandem walk with inability to adduct RLE to midline. Diffuclty with single limb stance demands such as balance and/or high step march. No issues raised with seated VOR and good visual tracking noted. Addition of strength biased movevements for today's HEP. Continued sessions to advance strength and dynamic balance activities   OBJECTIVE IMPAIRMENTS cardiopulmonary status limiting activity, decreased activity tolerance, decreased balance, decreased endurance, decreased strength, dizziness, improper body mechanics, and postural dysfunction.   ACTIVITY LIMITATIONS lifting, standing, stairs, and locomotion level  PARTICIPATION  LIMITATIONS: meal prep, cleaning, community activity, and yard work  PERSONAL FACTORS Age, Time since onset of injury/illness/exacerbation, and 1-2 comorbidities: cardiac conditions  are also affecting patient's functional outcome.   REHAB POTENTIAL: Good  CLINICAL DECISION MAKING: Evolving/moderate complexity  EVALUATION COMPLEXITY: Moderate  PLAN: PT FREQUENCY: 1-2x/week  PT DURATION: 6 weeks  PLANNED INTERVENTIONS: Therapeutic exercises, Therapeutic activity, Neuromuscular re-education, Balance training, Gait training, Patient/Family education, Self Care, Joint mobilization, Stair training, Vestibular training, Canalith repositioning, DME instructions, Electrical stimulation, and Manual therapy  PLAN FOR NEXT SESSION: Berg Balance, strength HEP   9:23 AM, 09/13/22 M. Sherlyn Lees, PT, DPT Physical Therapist- [facility] Office Number: [phone]

## 2022-09-14 ENCOUNTER — Other Ambulatory Visit (HOSPITAL_COMMUNITY): Payer: Self-pay

## 2022-09-20 ENCOUNTER — Ambulatory Visit: Payer: Medicare Other

## 2022-09-20 DIAGNOSIS — R2681 Unsteadiness on feet: Secondary | ICD-10-CM | POA: Diagnosis not present

## 2022-09-20 DIAGNOSIS — M6281 Muscle weakness (generalized): Secondary | ICD-10-CM

## 2022-09-20 DIAGNOSIS — R2689 Other abnormalities of gait and mobility: Secondary | ICD-10-CM

## 2022-09-20 NOTE — Therapy (Signed)
OUTPATIENT PHYSICAL THERAPY NEURO TREATMENT   Patient Name: Jordan [name]. MRN: [id number] DOB:1946/04/30, 76 y.o., male 53 Date: 09/20/2022   PCP: [name] REFERRING PROVIDER: Geralynn Rile, MD    PT End of Session - 09/20/22 0926     Visit Number 3    Number of Visits 6    Date for PT Re-Evaluation 10/21/22    Authorization Type BCBS Medicare    Progress Note Due on Visit 10    PT Start Time 0930    PT Stop Time 9476    PT Time Calculation (min) 45 min    Activity Tolerance Patient tolerated treatment well    Behavior During Therapy WFL for tasks assessed/performed             Past Medical History:  Diagnosis Date   Atrial fib/flutter, transient    Hx   Atrial fibrillation ([place])    coumadin   Colon cancer ([place])    family hx   Diverticulosis    Gout    Hemorrhoids    Hepatitis C    History of DVT (deep vein thrombosis)    History of nuclear stress test 04/2002   exercise; normal study    Hypertension    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[place])    history of    Personal history of colonic polyps    Prostate cancer ([place])    radical prostatectomy    Past Surgical History:  Procedure Laterality Date   CARDIAC CATHETERIZATION  2007   no occlusive CAD   CARDIOVERSION N/A 10/07/2021   Procedure: CARDIOVERSION;  Surgeon: Pixie Casino, MD;  Location: [facility];  Service: Cardiovascular;  Laterality: N/A;   CARDIOVERSION N/A 07/07/2022   Procedure: CARDIOVERSION;  Surgeon: Freada Bergeron, MD;  Location: [facility] ENDOSCOPY;  Service: Cardiovascular;  Laterality: N/A;   COLONOSCOPY  2002,2007   [name]   PROSTATECTOMY     PROSTATECTOMY  04-04   Dr. Risa Grill   TRANSTHORACIC ECHOCARDIOGRAM  05/2006   EF ~54%; LV systolic function normal; mild focal basal septal hypertrophy; AV thickness mildly increased; LA mod-markedly dilate   Patient Active Problem List   Diagnosis Date Noted   Hypotension 04/28/2022   NSVT  (nonsustained ventricular tachycardia) ([place]) 04/12/2022   Stage 3a chronic kidney disease ([place])    Chronic hepatitis C without hepatic coma ([place])    History of gout    Substance abuse ([place])    Right thalamic infarction ([place]) 04/27/2021   Personal history of colonic polyps 01/16/2019   Family history of colon cancer in mother 01/16/2019   Screening for lipid disorders 05/03/2018   NICM (nonischemic cardiomyopathy) ([place]) 65/02/5464   Chronic systolic heart failure ([place]) 02/15/2017   Dyspnea 01/07/2017   Essential hypertension 01/07/2017   Persistent atrial fibrillation ([place]) 03/15/2013   Long term (current) use of anticoagulants 03/15/2013   History of prostate cancer 02/16/2013    ONSET DATE: "some time"  REFERRING DIAG: hx of hereditary amyloidosis, HFpEF, persistent A-fib   THERAPY DIAG:  Unsteadiness on feet  Other abnormalities of gait and mobility  Muscle weakness (generalized)  Rationale for Evaluation and Treatment Rehabilitation  SUBJECTIVE:  SUBJECTIVE STATEMENT: Feeling pretty good, used the weights Pt accompanied by: self  PERTINENT HISTORY: cardiac PMH  PAIN:  Are you having pain? No  PRECAUTIONS: None  WEIGHT BEARING RESTRICTIONS No  FALLS: Has patient fallen in last 6 months? No  LIVING ENVIRONMENT: Lives with: lives alone Lives in: House/apartment Stairs: No Has following equipment at home: Single point cane  PLOF: Independent  PATIENT GOALS improve stamina and balance  OBJECTIVE:   TODAY'S TREATMENT: 09/20/22 Activity Comments  Dynamic balance warm-up 4x20 ft -high knees march -sidestepping -retro-walking -tandem walk  Sit-stand to calf raise 3x5 10# goblet, 20 sec rest between sets  Farmer's carry march 4x20 ft 10#  Pt noted to be quite SOB with  minimal physical effort. BP monitored 203/140, 88 bpm over sleeve automatic cuff 179/117, 63 bpm direct to skin, automatic cuff 170/110 manual 170/100 manual 180/120, 65 bpm automatic  Pt education on training principles and physiology        TODAY'S TREATMENT: 09/13/22 Activity Comments  Dynamic balance warm-up 4x20 ft -high knees march -sidestepping -retro-walking -tandem walk  Standing balance -feet together: EO/EC x 15 sec; head turns EO/EC 5x -semi-tandem EO/EC x 15 sec  Sit-stand 3x5 reps 10 lbs Goblet hold  Farmer's carry 4x20 ft 10#  Standing on foam EO/EC x 15 sec Head turns EO/EC x5   Seated VOR No abnormality noted, no issue reported    DIAGNOSTIC FINDINGS: cardiac conditions monitored and diagnosed  COGNITION: Overall cognitive status: Within functional limits for tasks assessed   SENSATION: WFL  COORDINATION: WNL  EDEMA:  none  MUSCLE TONE: WNL   MUSCLE LENGTH: DNT  DTRs:    POSTURE: rounded shoulders and forward head  LOWER EXTREMITY ROM:     WNL  LOWER EXTREMITY MMT:    4/5 gross BLE strength  BED MOBILITY:    TRANSFERS: Assistive device utilized: None  Sit to stand: Complete Independence Stand to sit: Complete Independence Chair to chair: Complete Independence Floor:  DNT    STAIRS:  Level of Assistance: Modified independence  Stair Negotiation Technique: Alternating Pattern  with Single Rail on Right  Number of Stairs: 10   Height of Stairs: 4-6"  Comments:   GAIT: Gait pattern: WFL Distance walked:  Assistive device utilized: None Level of assistance: Complete Independence Comments:   FUNCTIONAL TESTs:  5 times sit to stand: 20.56 Dynamic Gait Index: 13/24 Berg Balance Test: 40/56  M-CTSIB  Condition 1: Firm Surface, EO 30 Sec, Normal Sway  Condition 2: Firm Surface, EC 30 Sec, Mild and Moderate Sway  Condition 3: Foam Surface, EO 30 Sec, Normal Sway  Condition 4: Foam Surface, EC 30 Sec, Moderate Sway    Vestibular assessment: Saccades WNL Smooth pursuits WNL VOR x1 (slow and with some speed) WNL VOR cancellation WNL Head Impulse Test: negative right, positive left (slight) corrective saccade  PATIENT SURVEYS:      PATIENT EDUCATION: Education details: assessment findings, HEP initiation Person educated: Patient Education method: Explanation and Handouts Education comprehension: verbalized understanding and needs further education   HOME EXERCISE PROGRAM: Access Code: [email or web address] URL: [email or web address] Date: 09/09/2022 Prepared by: Sherlyn Lees  Exercises - Corner Balance Feet Together With Eyes Open  - 1 x daily - 7 x weekly - 3 sets - 15-30 sec hold - Corner Balance Feet Together With Eyes Closed  - 1 x daily - 7 x weekly - 3-5 sets - 15-30 hold - Corner Balance Feet Together: Eyes Open With Head Turns  - 1 x  daily - 7 x weekly - 5-10 reps - Semi-Tandem Corner Balance With Eyes Closed  - 1 x daily - 7 x weekly - 3-5 sets - 15-30 hold - Goblet Squat with Kettlebell  - 1 x daily - 7 x weekly - 5 sets - 5 reps - Farmer's Walk  - 1 x daily - 7 x weekly - 3 sets - 10 reps   GOALS: Goals reviewed with patient? Yes  SHORT TERM GOALS: Target date: 09/30/2022  Patient will be independent in HEP to improve functional outcomes Baseline: Goal status: INITIAL    LONG TERM GOALS: Target date: 10/21/2022  Demo low risk for falls per 19/24 Dynamic Gait Index Baseline: 13/24 Goal status: INITIAL  2.  Manifest improved BLE strength and reduced risk for falls per 15 sec 5xSTS Baseline: 20 sec Goal status: INITIAL  3.  Manifest low risk for falls per score 52/56 Berg Balance to improve safety with mobility and ADL Baseline: 40/56 Goal status: INITIAL   ASSESSMENT:  CLINICAL IMPRESSION: Treatment initiated with dynamic balance warm-up with pt demo DOE and report of minimal fatigue albeit requiring intermittent seated rest periods.  Continues to demo DOE  and pt reports he did not take his usual morning Rx which prompted more vial signs assessment which reveals concerns for elevated BP with trials using automatic cuff and manual cuff.  Pt reports no overt symptoms and denies HA, blurred vision, etc, but does exhibit runny nose. Tx session ended and recommend return home to take medication and re-assess BP for response.  Will call and check on pt later this AM   OBJECTIVE IMPAIRMENTS cardiopulmonary status limiting activity, decreased activity tolerance, decreased balance, decreased endurance, decreased strength, dizziness, improper body mechanics, and postural dysfunction.   ACTIVITY LIMITATIONS lifting, standing, stairs, and locomotion level  PARTICIPATION LIMITATIONS: meal prep, cleaning, community activity, and yard work  PERSONAL FACTORS Age, Time since onset of injury/illness/exacerbation, and 1-2 comorbidities: cardiac conditions  are also affecting patient's functional outcome.   REHAB POTENTIAL: Good  CLINICAL DECISION MAKING: Evolving/moderate complexity  EVALUATION COMPLEXITY: Moderate  PLAN: PT FREQUENCY: 1-2x/week  PT DURATION: 6 weeks  PLANNED INTERVENTIONS: Therapeutic exercises, Therapeutic activity, Neuromuscular re-education, Balance training, Gait training, Patient/Family education, Self Care, Joint mobilization, Stair training, Vestibular training, Canalith repositioning, DME instructions, Electrical stimulation, and Manual therapy  PLAN FOR NEXT SESSION: strength HEP   9:27 AM, 09/20/22 M. Sherlyn Lees, PT, DPT Physical Therapist- [place] Office Number: [phone]

## 2022-09-21 ENCOUNTER — Other Ambulatory Visit (HOSPITAL_COMMUNITY): Payer: Self-pay

## 2022-09-24 NOTE — Therapy (Signed)
OUTPATIENT PHYSICAL THERAPY NEURO TREATMENT   Patient Name: Jordan [name]. 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[place])    history of    Personal history of colonic polyps    Prostate cancer ([place])    radical prostatectomy    Past Surgical History:  Procedure Laterality Date   CARDIAC CATHETERIZATION  2007   no occlusive CAD   CARDIOVERSION N/A 10/07/2021   Procedure: CARDIOVERSION;  Surgeon: Pixie Casino, MD;  Location: [employer];  Service: Cardiovascular;  Laterality: N/A;   CARDIOVERSION N/A 07/07/2022   Procedure: CARDIOVERSION;  Surgeon: Freada Bergeron, MD;  Location: [facility] ENDOSCOPY;  Service: Cardiovascular;  Laterality: N/A;   COLONOSCOPY  2002,2007   [name]   PROSTATECTOMY     PROSTATECTOMY  04-04   Dr. Risa Grill   TRANSTHORACIC ECHOCARDIOGRAM  05/2006   EF ~40%; LV systolic function normal; mild focal basal septal hypertrophy; AV thickness mildly increased; LA mod-markedly dilate   Patient Active Problem List   Diagnosis Date Noted   Hypotension 04/28/2022   NSVT (nonsustained ventricular tachycardia) ([place]) 04/12/2022   Stage 3a chronic kidney disease ([place])    Chronic hepatitis C without hepatic coma ([place])    History of gout    Substance abuse ([place])    Right thalamic infarction ([place]) 04/27/2021   Personal history of colonic polyps 01/16/2019   Family history of colon cancer in mother 01/16/2019   Screening for lipid disorders 05/03/2018   NICM  (nonischemic cardiomyopathy) ([place]) 98/10/9146   Chronic systolic heart failure ([place]) 02/15/2017   Dyspnea 01/07/2017   Essential hypertension 01/07/2017   Persistent atrial fibrillation ([place]) 03/15/2013   Long term (current) use of anticoagulants 03/15/2013   History of prostate cancer 02/16/2013    ONSET DATE: "some time"  REFERRING DIAG: hx of hereditary amyloidosis, HFpEF, persistent A-fib   THERAPY DIAG:  No diagnosis found.  Rationale for Evaluation and Treatment Rehabilitation  SUBJECTIVE:  SUBJECTIVE STATEMENT: Feeling pretty good, used the weights Pt accompanied by: self  PERTINENT HISTORY: cardiac PMH  PAIN:  Are you having pain? No  PRECAUTIONS: None  WEIGHT BEARING RESTRICTIONS No  FALLS: Has patient fallen in last 6 months? No  LIVING ENVIRONMENT: Lives with: lives alone Lives in: House/apartment Stairs: No Has following equipment at home: Single point cane  PLOF: Independent  PATIENT GOALS improve stamina and balance  OBJECTIVE:     TODAY'S TREATMENT: 09/27/22 Activity Comments                          Below measures were taken at time of initial evaluation unless otherwise specified:  DIAGNOSTIC FINDINGS: cardiac conditions monitored and diagnosed  COGNITION: Overall cognitive status: Within functional limits for tasks assessed   SENSATION: WFL  COORDINATION: WNL  EDEMA:  none  MUSCLE TONE: WNL   MUSCLE LENGTH: DNT  DTRs:    POSTURE: rounded shoulders and forward head  LOWER EXTREMITY ROM:     WNL  LOWER EXTREMITY MMT:    4/5 gross BLE strength  BED MOBILITY:    TRANSFERS: Assistive device utilized: None  Sit to stand: Complete Independence Stand to sit: Complete Independence Chair to chair: Complete  Independence Floor:  DNT    STAIRS:  Level of Assistance: Modified independence  Stair Negotiation Technique: Alternating Pattern  with Single Rail on Right  Number of Stairs: 10   Height of Stairs: 4-6"  Comments:   GAIT: Gait pattern: WFL Distance walked:  Assistive device utilized: None Level of assistance: Complete Independence Comments:   FUNCTIONAL TESTs:  5 times sit to stand: 20.56 Dynamic Gait Index: 13/24 Berg Balance Test: 40/56  M-CTSIB  Condition 1: Firm Surface, EO 30 Sec, Normal Sway  Condition 2: Firm Surface, EC 30 Sec, Mild and Moderate Sway  Condition 3: Foam Surface, EO 30 Sec, Normal Sway  Condition 4: Foam Surface, EC 30 Sec, Moderate Sway   Vestibular assessment: Saccades WNL Smooth pursuits WNL VOR x1 (slow and with some speed) WNL VOR cancellation WNL Head Impulse Test: negative right, positive left (slight) corrective saccade  PATIENT SURVEYS:      PATIENT EDUCATION: Education details: assessment findings, HEP initiation Person educated: Patient Education method: Explanation and Handouts Education comprehension: verbalized understanding and needs further education   HOME EXERCISE PROGRAM: Access Code: [email or web address] URL: [email or web address] Date: 09/09/2022 Prepared by: Sherlyn Lees  Exercises - Corner Balance Feet Together With Eyes Open  - 1 x daily - 7 x weekly - 3 sets - 15-30 sec hold - Corner Balance Feet Together With Eyes Closed  - 1 x daily - 7 x weekly - 3-5 sets - 15-30 hold - Corner Balance Feet Together: Eyes Open With Head Turns  - 1 x daily - 7 x weekly - 5-10 reps - Semi-Tandem Corner Balance With Eyes Closed  - 1 x daily - 7 x weekly - 3-5 sets - 15-30 hold - Goblet Squat with Kettlebell  - 1 x daily - 7 x weekly - 5 sets - 5 reps - Farmer's Walk  - 1 x daily - 7 x weekly - 3 sets - 10 reps   GOALS: Goals reviewed with patient? Yes  SHORT TERM GOALS: Target date: 09/30/2022  Patient will be  independent in HEP to improve functional outcomes Baseline: Goal status: IN PROGRESS    LONG TERM GOALS: Target date: 10/21/2022  Demo low risk for falls per 19/24 Dynamic Gait Index  Baseline: 13/24 Goal status: IN PROGRESS  2.  Manifest improved BLE strength and reduced risk for falls per 15 sec 5xSTS Baseline: 20 sec Goal status: IN PROGRESS  3.  Manifest low risk for falls per score 52/56 Berg Balance to improve safety with mobility and ADL Baseline: 40/56 Goal status: IN PROGRESS   ASSESSMENT:  CLINICAL IMPRESSION: Treatment initiated with dynamic balance warm-up with pt demo DOE and report of minimal fatigue albeit requiring intermittent seated rest periods.  Continues to demo DOE and pt reports he did not take his usual morning Rx which prompted more vial signs assessment which reveals concerns for elevated BP with trials using automatic cuff and manual cuff.  Pt reports no overt symptoms and denies HA, blurred vision, etc, but does exhibit runny nose. Tx session ended and recommend return home to take medication and re-assess BP for response.  Will call and check on pt later this AM   OBJECTIVE IMPAIRMENTS cardiopulmonary status limiting activity, decreased activity tolerance, decreased balance, decreased endurance, decreased strength, dizziness, improper body mechanics, and postural dysfunction.   ACTIVITY LIMITATIONS lifting, standing, stairs, and locomotion level  PARTICIPATION LIMITATIONS: meal prep, cleaning, community activity, and yard work  PERSONAL FACTORS Age, Time since onset of injury/illness/exacerbation, and 1-2 comorbidities: cardiac conditions  are also affecting patient's functional outcome.   REHAB POTENTIAL: Good  CLINICAL DECISION MAKING: Evolving/moderate complexity  EVALUATION COMPLEXITY: Moderate  PLAN: PT FREQUENCY: 1-2x/week  PT DURATION: 6 weeks  PLANNED INTERVENTIONS: Therapeutic exercises, Therapeutic activity, Neuromuscular  re-education, Balance training, Gait training, Patient/Family education, Self Care, Joint mobilization, Stair training, Vestibular training, Canalith repositioning, DME instructions, Electrical stimulation, and Manual therapy  PLAN FOR NEXT SESSION: strength HEP   7:53 AM, 09/24/22 M. Sherlyn Lees, PT, DPT Physical Therapist- [place] Office Number: [phone]

## 2022-09-27 ENCOUNTER — Ambulatory Visit: Payer: Medicare Other | Attending: Cardiovascular Disease | Admitting: Physical Therapy

## 2022-09-27 ENCOUNTER — Telehealth: Payer: Self-pay | Admitting: Physical Therapy

## 2022-09-27 ENCOUNTER — Encounter: Payer: Self-pay | Admitting: Physical Therapy

## 2022-09-27 DIAGNOSIS — R2681 Unsteadiness on feet: Secondary | ICD-10-CM | POA: Insufficient documentation

## 2022-09-27 DIAGNOSIS — M6281 Muscle weakness (generalized): Secondary | ICD-10-CM | POA: Insufficient documentation

## 2022-09-27 DIAGNOSIS — R2689 Other abnormalities of gait and mobility: Secondary | ICD-10-CM | POA: Insufficient documentation
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
## 2022-09-30 NOTE — Telephone Encounter (Signed)
Attempted to contact patient to discuss, LVM to call back to get scheduled.   Left call back number.

## 2022-10-01 NOTE — Telephone Encounter (Signed)
I attempted to contact patient again to discuss further and get scheduled with APP.   Patient did not answer, LVM to call back to schedule.

## 2022-10-04 ENCOUNTER — Ambulatory Visit: Payer: Medicare Other

## 2022-10-04 DIAGNOSIS — R2689 Other abnormalities of gait and mobility: Secondary | ICD-10-CM | POA: Diagnosis present

## 2022-10-04 DIAGNOSIS — M6281 Muscle weakness (generalized): Secondary | ICD-10-CM | POA: Diagnosis present

## 2022-10-04 DIAGNOSIS — R2681 Unsteadiness on feet: Secondary | ICD-10-CM | POA: Diagnosis not present
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
## 2022-10-07 ENCOUNTER — Other Ambulatory Visit (HOSPITAL_COMMUNITY): Payer: Self-pay

## 2022-10-11 ENCOUNTER — Other Ambulatory Visit (HOSPITAL_COMMUNITY): Payer: Self-pay

## 2022-10-11 ENCOUNTER — Ambulatory Visit: Payer: Medicare Other

## 2022-10-18 ENCOUNTER — Ambulatory Visit: Payer: Medicare Other

## 2022-10-18 DIAGNOSIS — R2689 Other abnormalities of gait and mobility: Secondary | ICD-10-CM

## 2022-10-18 DIAGNOSIS — M6281 Muscle weakness (generalized): Secondary | ICD-10-CM

## 2022-10-18 DIAGNOSIS — R2681 Unsteadiness on feet: Secondary | ICD-10-CM

## 2022-10-18 NOTE — Therapy (Signed)
OUTPATIENT PHYSICAL THERAPY NEURO TREATMENT, Progress Note, and Recertification   Patient Name: Jordan [name]. MRN: [id number] DOB:20-Sep-1946, 76 y.o., male Today's Date: 10/18/2022   PCP: [name] REFERRING PROVIDER: Geralynn Rile, MD   Progress Note Reporting Period 09/09/22 to 10/18/22  See note below for Objective Data and Assessment of Progress/Goals.      PT End of Session - 10/18/22 0930     Visit Number 5    Number of Visits 12    Date for PT Re-Evaluation 11/29/22    Authorization Type [employer] Medicare    Progress Note Due on Visit 15    PT Start Time 0930    PT Stop Time 1015    PT Time Calculation (min) 45 min    Activity Tolerance Patient tolerated treatment well    Behavior During Therapy WFL for tasks assessed/performed             Past Medical History:  Diagnosis Date   Atrial fib/flutter, transient    Hx   Atrial fibrillation ([place])    coumadin   Colon cancer ([place])    family hx   Diverticulosis    Gout    Hemorrhoids    Hepatitis C    History of DVT (deep vein thrombosis)    History of nuclear stress test 04/2002   exercise; normal study    Hypertension    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[place])    history of    Personal history of colonic polyps    Prostate cancer ([place])    radical prostatectomy    Past Surgical History:  Procedure Laterality Date   CARDIAC CATHETERIZATION  2007   no occlusive CAD   CARDIOVERSION N/A 10/07/2021   Procedure: CARDIOVERSION;  Surgeon: Pixie Casino, MD;  Location: [facility];  Service: Cardiovascular;  Laterality: N/A;   CARDIOVERSION N/A 07/07/2022   Procedure: CARDIOVERSION;  Surgeon: Freada Bergeron, MD;  Location: [facility] ENDOSCOPY;  Service: Cardiovascular;  Laterality: N/A;   COLONOSCOPY  2002,2007   [name]   PROSTATECTOMY     PROSTATECTOMY  04-04   Dr. Risa Grill   TRANSTHORACIC ECHOCARDIOGRAM  05/2006   EF ~50%; LV systolic function normal; mild focal  basal septal hypertrophy; AV thickness mildly increased; LA mod-markedly dilate   Patient Active Problem List   Diagnosis Date Noted   Hypotension 04/28/2022   NSVT (nonsustained ventricular tachycardia) ([place]) 04/12/2022   Stage 3a chronic kidney disease ([place])    Chronic hepatitis C without hepatic coma ([place])    History of gout    Substance abuse ([place])    Right thalamic infarction ([place]) 04/27/2021   Personal history of colonic polyps 01/16/2019   Family history of colon cancer in mother 01/16/2019   Screening for lipid disorders 05/03/2018   NICM (nonischemic cardiomyopathy) ([place]) 27/74/1287   Chronic systolic heart failure ([place]) 02/15/2017   Dyspnea 01/07/2017   Essential hypertension 01/07/2017   Persistent atrial fibrillation ([place]) 03/15/2013   Long term (current) use of anticoagulants 03/15/2013   History of prostate cancer 02/16/2013    ONSET DATE: "some time"  REFERRING DIAG: hx of hereditary amyloidosis, HFpEF, persistent A-fib   THERAPY DIAG:  Unsteadiness on feet  Other abnormalities of gait and mobility  Muscle weakness (generalized)  Rationale for Evaluation and Treatment Rehabilitation  SUBJECTIVE:  SUBJECTIVE STATEMENT: Was in [place] this last week. Tired from the trip  Pt accompanied by: self  PERTINENT HISTORY: cardiac PMH  PAIN:  Are you having pain? No  PRECAUTIONS: None  WEIGHT BEARING RESTRICTIONS No  FALLS: Has patient fallen in last 6 months? No  LIVING ENVIRONMENT: Lives with: lives alone Lives in: House/apartment Stairs: No Has following equipment at home: Single point cane  PLOF: Independent  PATIENT GOALS improve stamina and balance  OBJECTIVE:  BP at start of session: 155/121 mmHg, 83 bpm BP after sitting 5 min: 156/116 mmHg, 58 bpm BP after 5  min exercise: 160/119 mmHg, 58 bpm  TODAY'S TREATMENT: 10/18/22 Activity Comments  NU-step level 5 x 5 min. Rates 13/20 on RPE Light-moderate exertion to re-assess vital signs  Corner balance -feet together: EO/EC x 30 sec, head turns 5x EO/EC  5xSTS 21 sec  Berg Balance Test [id number]              DIAGNOSTIC FINDINGS: cardiac conditions monitored and diagnosed  COGNITION: Overall cognitive status: Within functional limits for tasks assessed   SENSATION: WFL  COORDINATION: WNL  EDEMA:  none  MUSCLE TONE: WNL   MUSCLE LENGTH: DNT  DTRs:    POSTURE: rounded shoulders and forward head  LOWER EXTREMITY ROM:     WNL  LOWER EXTREMITY MMT:    4/5 gross BLE strength  BED MOBILITY:    TRANSFERS: Assistive device utilized: None  Sit to stand: Complete Independence Stand to sit: Complete Independence Chair to chair: Complete Independence Floor:  DNT    STAIRS:  Level of Assistance: Modified independence  Stair Negotiation Technique: Alternating Pattern  with Single Rail on Right  Number of Stairs: 10   Height of Stairs: 4-6"  Comments:   GAIT: Gait pattern: WFL Distance walked:  Assistive device utilized: None Level of assistance: Complete Independence Comments:   FUNCTIONAL TESTs:  5 times sit to stand: 20.56 Dynamic Gait Index: 13/24 Berg Balance Test: 40/56  M-CTSIB  Condition 1: Firm Surface, EO 30 Sec, Normal Sway  Condition 2: Firm Surface, EC 30 Sec, Mild and Moderate Sway  Condition 3: Foam Surface, EO 30 Sec, Normal Sway  Condition 4: Foam Surface, EC 30 Sec, Moderate Sway   Vestibular assessment: Saccades WNL Smooth pursuits WNL VOR x1 (slow and with some speed) WNL VOR cancellation WNL Head Impulse Test: negative right, positive left (slight) corrective saccade  PATIENT SURVEYS:      PATIENT EDUCATION: Education details: assessment findings, HEP initiation Person educated: Patient Education method: Explanation and  Handouts Education comprehension: verbalized understanding and needs further education   HOME EXERCISE PROGRAM: Access Code: [email or web address] URL: [email or web address] Date: 09/09/2022 Prepared by: Sherlyn Lees  Exercises - Corner Balance Feet Together With Eyes Open  - 1 x daily - 7 x weekly - 3 sets - 15-30 sec hold - Corner Balance Feet Together With Eyes Closed  - 1 x daily - 7 x weekly - 3-5 sets - 15-30 hold - Corner Balance Feet Together: Eyes Open With Head Turns  - 1 x daily - 7 x weekly - 5-10 reps - Semi-Tandem Corner Balance With Eyes Closed  - 1 x daily - 7 x weekly - 3-5 sets - 15-30 hold - Goblet Squat with Kettlebell  - 1 x daily - 7 x weekly - 5 sets - 5 reps - Farmer's Walk  - 1 x daily - 7 x weekly - 3 sets - 10 reps   GOALS: Goals reviewed with patient?  Yes  SHORT TERM GOALS: Target date: 11/08/22  Patient will be independent in HEP to improve functional outcomes Baseline: Goal status: on-going    LONG TERM GOALS: Target date: 11/29/22  Demo low risk for falls per 19/24 Dynamic Gait Index Baseline: 13/24 Goal status: on-going  2.  Manifest improved BLE strength and reduced risk for falls per 15 sec 5xSTS Baseline: 20 sec; (10/18/22) 21 sec Goal status: ongoing  3.  Manifest low risk for falls per score 52/56 Berg Balance to improve safety with mobility and ADL Baseline: 40/56; (10/18/22) 49/56 Goal status: On-going   ASSESSMENT:  CLINICAL IMPRESSION: Performed progress note and recertification today with pt demonstrating significant improvement in Berg Balance Test score with obvious improvements in standing balance. Activities continue to be constrained and activity tolerance limited by his excessively high blood pressure readings while at rest but which do not manifest much change with mild-moderate exertion that we have been engaging in therapy.  Pt has follow-up with primary care and I have asked that he clarify the parameters for  exertion/exercise capacity regarding his underlying cardiac disease. Pt would benefit from continued therapeutic interventions to address residual balance deficits and hopefully progress to more strength activities to complement current POC provided that MD approves of this mode.    OBJECTIVE IMPAIRMENTS cardiopulmonary status limiting activity, decreased activity tolerance, decreased balance, decreased endurance, decreased strength, dizziness, improper body mechanics, and postural dysfunction.   ACTIVITY LIMITATIONS lifting, standing, stairs, and locomotion level  PARTICIPATION LIMITATIONS: meal prep, cleaning, community activity, and yard work  PERSONAL FACTORS Age, Time since onset of injury/illness/exacerbation, and 1-2 comorbidities: cardiac conditions  are also affecting patient's functional outcome.   REHAB POTENTIAL: Good  CLINICAL DECISION MAKING: Evolving/moderate complexity  EVALUATION COMPLEXITY: Moderate  PLAN: PT FREQUENCY: 1-2x/week  PT DURATION: 6 weeks  PLANNED INTERVENTIONS: Therapeutic exercises, Therapeutic activity, Neuromuscular re-education, Balance training, Gait training, Patient/Family education, Self Care, Joint mobilization, Stair training, Vestibular training, Canalith repositioning, DME instructions, Electrical stimulation, and Manual therapy  PLAN FOR NEXT SESSION: strength HEP if appropriate   10:05 AM, 10/18/22 M. Sherlyn Lees, PT, DPT Physical Therapist- [place] Office Number: [phone]

## 2022-10-22 NOTE — Therapy (Incomplete)
OUTPATIENT PHYSICAL THERAPY NEURO TREATMENT   Patient Name: Jordan [name]. 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[place])    history of    Personal history of colonic polyps    Prostate cancer ([place])    radical prostatectomy    Past Surgical History:  Procedure Laterality Date   CARDIAC CATHETERIZATION  2007   no occlusive CAD   CARDIOVERSION N/A 10/07/2021   Procedure: CARDIOVERSION;  Surgeon: Pixie Casino, MD;  Location: [facility];  Service: Cardiovascular;  Laterality: N/A;   CARDIOVERSION N/A 07/07/2022   Procedure: CARDIOVERSION;  Surgeon: Freada Bergeron, MD;  Location: [facility] ENDOSCOPY;  Service: Cardiovascular;  Laterality: N/A;   COLONOSCOPY  2002,2007   [name]   PROSTATECTOMY     PROSTATECTOMY  04-04   Dr. Risa Grill   TRANSTHORACIC ECHOCARDIOGRAM  05/2006   EF ~40%; LV systolic function normal; mild focal basal septal hypertrophy; AV thickness mildly increased; LA mod-markedly dilate   Patient Active Problem List   Diagnosis Date Noted   Hypotension 04/28/2022   NSVT (nonsustained ventricular tachycardia) ([place]) 04/12/2022   Stage 3a chronic kidney disease ([place])    Chronic hepatitis C without hepatic coma ([place])    History of gout    Substance abuse ([place])    Right thalamic infarction ([place]) 04/27/2021   Personal history of colonic polyps 01/16/2019   Family history of colon cancer in mother 01/16/2019   Screening for lipid disorders 05/03/2018    NICM (nonischemic cardiomyopathy) ([place]) 34/74/2595   Chronic systolic heart failure ([place]) 02/15/2017   Dyspnea 01/07/2017   Essential hypertension 01/07/2017   Persistent atrial fibrillation ([place]) 03/15/2013   Long term (current) use of anticoagulants 03/15/2013   History of prostate cancer 02/16/2013    ONSET DATE: "some time"  REFERRING DIAG: hx of hereditary amyloidosis, HFpEF, persistent A-fib   THERAPY DIAG:  No diagnosis found.  Rationale for Evaluation and Treatment Rehabilitation  SUBJECTIVE:  SUBJECTIVE STATEMENT: Was in [place] this last week. Tired from the trip  Pt accompanied by: self  PERTINENT HISTORY: cardiac PMH  PAIN:  Are you having pain? No  PRECAUTIONS: None  WEIGHT BEARING RESTRICTIONS No  FALLS: Has patient fallen in last 6 months? No  LIVING ENVIRONMENT: Lives with: lives alone Lives in: House/apartment Stairs: No Has following equipment at home: Single point cane  PLOF: Independent  PATIENT GOALS improve stamina and balance  OBJECTIVE:     TODAY'S TREATMENT: 10/26/22 Activity Comments                         Below measures were taken at time of initial evaluation unless otherwise specified:   DIAGNOSTIC FINDINGS: cardiac conditions monitored and diagnosed  COGNITION: Overall cognitive status: Within functional limits for tasks assessed   SENSATION: WFL  COORDINATION: WNL  EDEMA:  none  MUSCLE TONE: WNL   MUSCLE LENGTH: DNT  DTRs:    POSTURE: rounded shoulders and forward head  LOWER EXTREMITY ROM:     WNL  LOWER EXTREMITY MMT:    4/5 gross BLE strength  BED MOBILITY:    TRANSFERS: Assistive device utilized: None  Sit to stand: Complete Independence Stand to sit: Complete Independence Chair to chair: Complete  Independence Floor:  DNT    STAIRS:  Level of Assistance: Modified independence  Stair Negotiation Technique: Alternating Pattern  with Single Rail on Right  Number of Stairs: 10   Height of Stairs: 4-6"  Comments:   GAIT: Gait pattern: WFL Distance walked:  Assistive device utilized: None Level of assistance: Complete Independence Comments:   FUNCTIONAL TESTs:  5 times sit to stand: 20.56 Dynamic Gait Index: 13/24 Berg Balance Test: 40/56  M-CTSIB  Condition 1: Firm Surface, EO 30 Sec, Normal Sway  Condition 2: Firm Surface, EC 30 Sec, Mild and Moderate Sway  Condition 3: Foam Surface, EO 30 Sec, Normal Sway  Condition 4: Foam Surface, EC 30 Sec, Moderate Sway   Vestibular assessment: Saccades WNL Smooth pursuits WNL VOR x1 (slow and with some speed) WNL VOR cancellation WNL Head Impulse Test: negative right, positive left (slight) corrective saccade  PATIENT SURVEYS:      PATIENT EDUCATION: Education details: assessment findings, HEP initiation Person educated: Patient Education method: Explanation and Handouts Education comprehension: verbalized understanding and needs further education   HOME EXERCISE PROGRAM: Access Code: [email or web address] URL: [email or web address] Date: 09/09/2022 Prepared by: Sherlyn Lees  Exercises - Corner Balance Feet Together With Eyes Open  - 1 x daily - 7 x weekly - 3 sets - 15-30 sec hold - Corner Balance Feet Together With Eyes Closed  - 1 x daily - 7 x weekly - 3-5 sets - 15-30 hold - Corner Balance Feet Together: Eyes Open With Head Turns  - 1 x daily - 7 x weekly - 5-10 reps - Semi-Tandem Corner Balance With Eyes Closed  - 1 x daily - 7 x weekly - 3-5 sets - 15-30 hold - Goblet Squat with Kettlebell  - 1 x daily - 7 x weekly - 5 sets - 5 reps - Farmer's Walk  - 1 x daily - 7 x weekly - 3 sets - 10 reps   GOALS: Goals reviewed with patient? Yes  SHORT TERM GOALS: Target date: 11/08/22  Patient will be  independent in HEP to improve functional outcomes Baseline: Goal status: on-going    LONG TERM GOALS: Target date: 11/29/22  Demo low risk for falls per  19/24 Dynamic Gait Index Baseline: 13/24 Goal status: on-going  2.  Manifest improved BLE strength and reduced risk for falls per 15 sec 5xSTS Baseline: 20 sec; (10/18/22) 21 sec Goal status: ongoing  3.  Manifest low risk for falls per score 52/56 Berg Balance to improve safety with mobility and ADL Baseline: 40/56; (10/18/22) 49/56 Goal status: On-going   ASSESSMENT:  CLINICAL IMPRESSION: Performed progress note and recertification today with pt demonstrating significant improvement in Berg Balance Test score with obvious improvements in standing balance. Activities continue to be constrained and activity tolerance limited by his excessively high blood pressure readings while at rest but which do not manifest much change with mild-moderate exertion that we have been engaging in therapy.  Pt has follow-up with primary care and I have asked that he clarify the parameters for exertion/exercise capacity regarding his underlying cardiac disease. Pt would benefit from continued therapeutic interventions to address residual balance deficits and hopefully progress to more strength activities to complement current POC provided that MD approves of this mode.    OBJECTIVE IMPAIRMENTS cardiopulmonary status limiting activity, decreased activity tolerance, decreased balance, decreased endurance, decreased strength, dizziness, improper body mechanics, and postural dysfunction.   ACTIVITY LIMITATIONS lifting, standing, stairs, and locomotion level  PARTICIPATION LIMITATIONS: meal prep, cleaning, community activity, and yard work  PERSONAL FACTORS Age, Time since onset of injury/illness/exacerbation, and 1-2 comorbidities: cardiac conditions  are also affecting patient's functional outcome.   REHAB POTENTIAL: Good  CLINICAL DECISION MAKING:  Evolving/moderate complexity  EVALUATION COMPLEXITY: Moderate  PLAN: PT FREQUENCY: 1-2x/week  PT DURATION: 6 weeks  PLANNED INTERVENTIONS: Therapeutic exercises, Therapeutic activity, Neuromuscular re-education, Balance training, Gait training, Patient/Family education, Self Care, Joint mobilization, Stair training, Vestibular training, Canalith repositioning, DME instructions, Electrical stimulation, and Manual therapy  PLAN FOR NEXT SESSION: strength HEP if appropriate   8:34 AM, 10/22/22 M. Sherlyn Lees, PT, DPT Physical Therapist- [place] Office Number: [phone]

## 2022-10-26 ENCOUNTER — Ambulatory Visit: Payer: Medicare Other | Admitting: Physical Therapy

## 2022-10-29 NOTE — Telephone Encounter (Signed)
Called patient, was able to speak to him.   He states that he has been feeling better, blood pressures per him are improving- however review of last PT note (in epic) blood pressure continue to remain elevated.   He does have an appointment on 11/13 with you, but have been unable to reach him until today to get him scheduled sooner. Do you still recommend to have him seen next week or do you want him to see you on 11/13.  I advised I would call him back with recommendations.   Thanks!

## 2022-11-02 ENCOUNTER — Ambulatory Visit: Payer: Medicare Other | Attending: Cardiovascular Disease

## 2022-11-02 DIAGNOSIS — R2681 Unsteadiness on feet: Secondary | ICD-10-CM | POA: Diagnosis not present

## 2022-11-02 DIAGNOSIS — M6281 Muscle weakness (generalized): Secondary | ICD-10-CM | POA: Diagnosis present

## 2022-11-02 DIAGNOSIS — R2689 Other abnormalities of gait and mobility: Secondary | ICD-10-CM | POA: Diagnosis present

## 2022-11-02 NOTE — Therapy (Signed)
OUTPATIENT PHYSICAL THERAPY NEURO TREATMENT, Progress Note, and Recertification   Patient Name: Jordan [name]. MRN: [id number] DOB:1946/11/08, 76 y.o., male 56 Date: 11/02/2022   PCP: [name] REFERRING PROVIDER: Geralynn Rile, MD      PT End of Session - 11/02/22 0928     Visit Number 6    Number of Visits 12    Date for PT Re-Evaluation 11/29/22    Authorization Type BCBS Medicare    Progress Note Due on Visit 15    PT Start Time 0930    PT Stop Time 8938    PT Time Calculation (min) 45 min    Activity Tolerance Patient tolerated treatment well    Behavior During Therapy WFL for tasks assessed/performed             Past Medical History:  Diagnosis Date   Atrial fib/flutter, transient    Hx   Atrial fibrillation ([place])    coumadin   Colon cancer ([place])    family hx   Diverticulosis    Gout    Hemorrhoids    Hepatitis C    History of DVT (deep vein thrombosis)    History of nuclear stress test 04/2002   exercise; normal study    Hypertension    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;  Service: Cardiovascular;  Laterality: N/A;   CARDIOVERSION N/A 07/07/2022   Procedure: CARDIOVERSION;  Surgeon: Freada Bergeron, MD;  Location: [facility] ENDOSCOPY;  Service: Cardiovascular;  Laterality: N/A;   COLONOSCOPY  2002,2007   [name]   PROSTATECTOMY     PROSTATECTOMY  04-04   Dr. Risa Grill   TRANSTHORACIC ECHOCARDIOGRAM  05/2006   EF ~10%; LV systolic function normal; mild focal basal septal hypertrophy; AV thickness mildly increased; LA mod-markedly dilate   Patient Active Problem List   Diagnosis Date  Noted   Hypotension 04/28/2022   NSVT (nonsustained ventricular tachycardia) ([place]) 04/12/2022   Stage 3a chronic kidney disease ([place])    Chronic hepatitis C without hepatic coma ([place])    History of gout    Substance abuse ([place])    Right thalamic infarction ([place]) 04/27/2021   Personal history of colonic polyps 01/16/2019   Family history of colon cancer in mother 01/16/2019   Screening for lipid disorders 05/03/2018   NICM (nonischemic cardiomyopathy) ([place]) 17/51/0258   Chronic systolic heart failure ([place]) 02/15/2017   Dyspnea 01/07/2017   Essential hypertension 01/07/2017   Persistent atrial fibrillation ([place]) 03/15/2013   Long term (current) use of anticoagulants 03/15/2013   History of prostate cancer 02/16/2013    ONSET DATE: "some time"  REFERRING DIAG: hx of hereditary amyloidosis, HFpEF, persistent A-fib   THERAPY DIAG:  Unsteadiness on feet  Other abnormalities of gait and mobility  Muscle weakness (generalized)  Rationale for Evaluation and Treatment Rehabilitation  SUBJECTIVE:  SUBJECTIVE STATEMENT: Went to primary MD the other week, unsure about exercise tolerance/limits regarding BP  Pt accompanied by: self  PERTINENT HISTORY: cardiac PMH  PAIN:  Are you having pain? No  PRECAUTIONS: None  WEIGHT BEARING RESTRICTIONS No  FALLS: Has patient fallen in last 6 months? No  LIVING ENVIRONMENT: Lives with: lives alone Lives in: House/apartment Stairs: No Has following equipment at home: Single point cane  PLOF: Independent  PATIENT GOALS improve stamina and balance  OBJECTIVE:  BP at start of session: 169/113, 62 bpm BP after 5 min exertion: 171/114   TODAY'S TREATMENT: 11/02/22 Activity Comments  Sidestepping x 2 min   Alt toe taps BOSU x 2 min 5# ankle  weights  Alt foot on BOSU w/ head turns Greater difficulty with vertical  Single leg on BOSU 3x15 sec eyes closed   LAQ 3x15 reps 5# for endurance  Resisted march 2x15 reps 5# for endurance  Pt education in exercise prescriptions; e.g. sets/reps for endurance Explanation and handout listing format and also RPE for self-assessment    TODAY'S TREATMENT: 10/18/22 Activity Comments  NU-step level 5 x 5 min. Rates 13/20 on RPE Light-moderate exertion to re-assess vital signs  Corner balance -feet together: EO/EC x 30 sec, head turns 5x EO/EC  5xSTS 21 sec  Berg Balance Test [phone]              DIAGNOSTIC FINDINGS: cardiac conditions monitored and diagnosed  COGNITION: Overall cognitive status: Within functional limits for tasks assessed   SENSATION: WFL  COORDINATION: WNL  EDEMA:  none  MUSCLE TONE: WNL   MUSCLE LENGTH: DNT  DTRs:    POSTURE: rounded shoulders and forward head  LOWER EXTREMITY ROM:     WNL  LOWER EXTREMITY MMT:    4/5 gross BLE strength  BED MOBILITY:    TRANSFERS: Assistive device utilized: None  Sit to stand: Complete Independence Stand to sit: Complete Independence Chair to chair: Complete Independence Floor:  DNT    STAIRS:  Level of Assistance: Modified independence  Stair Negotiation Technique: Alternating Pattern  with Single Rail on Right  Number of Stairs: 10   Height of Stairs: 4-6"  Comments:   GAIT: Gait pattern: WFL Distance walked:  Assistive device utilized: None Level of assistance: Complete Independence Comments:   FUNCTIONAL TESTs:  5 times sit to stand: 20.56 Dynamic Gait Index: 13/24 Berg Balance Test: 40/56  M-CTSIB  Condition 1: Firm Surface, EO 30 Sec, Normal Sway  Condition 2: Firm Surface, EC 30 Sec, Mild and Moderate Sway  Condition 3: Foam Surface, EO 30 Sec, Normal Sway  Condition 4: Foam Surface, EC 30 Sec, Moderate Sway   Vestibular assessment: Saccades WNL Smooth pursuits WNL VOR x1  (slow and with some speed) WNL VOR cancellation WNL Head Impulse Test: negative right, positive left (slight) corrective saccade  PATIENT SURVEYS:      PATIENT EDUCATION: Education details: assessment findings, HEP initiation Person educated: Patient Education method: Explanation and Handouts Education comprehension: verbalized understanding and needs further education   HOME EXERCISE PROGRAM: Access Code: [email or web address] URL: [email or web address] Date: 09/09/2022 Prepared by: Sherlyn Lees  Exercises - Corner Balance Feet Together With Eyes Open  - 1 x daily - 7 x weekly - 3 sets - 15-30 sec hold - Corner Balance Feet Together With Eyes Closed  - 1 x daily - 7 x weekly - 3-5 sets - 15-30 hold - Corner Balance Feet Together: Eyes Open With Head Turns  - 1 x daily -  7 x weekly - 5-10 reps - Semi-Tandem Corner Balance With Eyes Closed  - 1 x daily - 7 x weekly - 3-5 sets - 15-30 hold - Goblet Squat with Kettlebell  - 1 x daily - 7 x weekly - 5 sets - 5 reps - Farmer's Walk  - 1 x daily - 7 x weekly - 3 sets - 10 reps   GOALS: Goals reviewed with patient? Yes  SHORT TERM GOALS: Target date: 11/08/22  Patient will be independent in HEP to improve functional outcomes Baseline: Goal status: on-going    LONG TERM GOALS: Target date: 11/29/22  Demo low risk for falls per 19/24 Dynamic Gait Index Baseline: 13/24 Goal status: on-going  2.  Manifest improved BLE strength and reduced risk for falls per 15 sec 5xSTS Baseline: 20 sec; (10/18/22) 21 sec Goal status: ongoing  3.  Manifest low risk for falls per score 52/56 Berg Balance to improve safety with mobility and ADL Baseline: 40/56; (10/18/22) 49/56 Goal status: On-going   ASSESSMENT:  CLINICAL IMPRESSION: Continued with tx sessions to now initiate more in the realm of muscular endurance training with education and demonstration using lighter resistance and education in dosage prescription and use of RPE for  self-assessment. Verbalizes understanding. Activities incorporating single limb stability and performance of head turns for added challenge and demonstrates frequent lateral LOB requiring UE support to correct balance.  No adverse response to activities today.    OBJECTIVE IMPAIRMENTS cardiopulmonary status limiting activity, decreased activity tolerance, decreased balance, decreased endurance, decreased strength, dizziness, improper body mechanics, and postural dysfunction.   ACTIVITY LIMITATIONS lifting, standing, stairs, and locomotion level  PARTICIPATION LIMITATIONS: meal prep, cleaning, community activity, and yard work  PERSONAL FACTORS Age, Time since onset of injury/illness/exacerbation, and 1-2 comorbidities: cardiac conditions  are also affecting patient's functional outcome.   REHAB POTENTIAL: Good  CLINICAL DECISION MAKING: Evolving/moderate complexity  EVALUATION COMPLEXITY: Moderate  PLAN: PT FREQUENCY: 1-2x/week  PT DURATION: 6 weeks  PLANNED INTERVENTIONS: Therapeutic exercises, Therapeutic activity, Neuromuscular re-education, Balance training, Gait training, Patient/Family education, Self Care, Joint mobilization, Stair training, Vestibular training, Canalith repositioning, DME instructions, Electrical stimulation, and Manual therapy  PLAN FOR NEXT SESSION: continue with muscular endurance exercises   9:29 AM, 11/02/22 M. Sherlyn Lees, PT, DPT Physical Therapist- [place] Office Number: [phone]

## 2022-11-03 ENCOUNTER — Other Ambulatory Visit (HOSPITAL_COMMUNITY): Payer: Self-pay

## 2022-11-03 NOTE — Telephone Encounter (Signed)
Called patient, 11/13 clinic canceled. I moved him to 11/14 double book to discuss BP concerns. Next appointment was in December.  Patient verbalized understanding. Thanks!

## 2022-11-05 NOTE — Therapy (Signed)
OUTPATIENT PHYSICAL THERAPY NEURO TREATMENT   Patient Name: Jordan [name]. MRN: [id number] DOB:03-24-1946, 76 y.o., male 41 Date: 11/08/2022   PCP: [name] REFERRING PROVIDER: Geralynn Rile, MD      PT End of Session - 11/08/22 1025     Visit Number 7    Number of Visits 12    Date for PT Re-Evaluation 11/29/22    Authorization Type BCBS Medicare    Progress Note Due on Visit 15    PT Start Time 0935    PT Stop Time 1022    PT Time Calculation (min) 47 min    Equipment Utilized During Treatment Gait belt    Activity Tolerance Patient tolerated treatment well    Behavior During Therapy WFL for tasks assessed/performed              Past Medical History:  Diagnosis Date   Atrial fib/flutter, transient    Hx   Atrial fibrillation ([place])    coumadin   Colon cancer ([place])    family hx   Diverticulosis    Gout    Hemorrhoids    Hepatitis C    History of DVT (deep vein thrombosis)    History of nuclear stress test 04/2002   exercise; normal study    Hypertension    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[name])    history of    Personal history of colonic polyps    Prostate cancer ([place])    radical prostatectomy    Past Surgical History:  Procedure Laterality Date   CARDIAC CATHETERIZATION  2007   no occlusive CAD   CARDIOVERSION N/A 10/07/2021   Procedure: CARDIOVERSION;  Surgeon: Pixie Casino, MD;  Location: [facility];  Service: Cardiovascular;  Laterality: N/A;   CARDIOVERSION N/A 07/07/2022   Procedure: CARDIOVERSION;  Surgeon: Freada Bergeron, MD;  Location: [facility] ENDOSCOPY;  Service: Cardiovascular;  Laterality: N/A;   COLONOSCOPY  2002,2007   [name]   PROSTATECTOMY     PROSTATECTOMY  04-04   Dr. Risa Grill   TRANSTHORACIC ECHOCARDIOGRAM  05/2006   EF ~24%; LV systolic function normal; mild focal basal septal hypertrophy; AV thickness mildly increased; LA mod-markedly dilate   Patient Active Problem List    Diagnosis Date Noted   Hypotension 04/28/2022   NSVT (nonsustained ventricular tachycardia) ([place]) 04/12/2022   Stage 3a chronic kidney disease ([place])    Chronic hepatitis C without hepatic coma ([place])    History of gout    Substance abuse ([place])    Right thalamic infarction ([place]) 04/27/2021   Personal history of colonic polyps 01/16/2019   Family history of colon cancer in mother 01/16/2019   Screening for lipid disorders 05/03/2018   NICM (nonischemic cardiomyopathy) ([place]) 58/08/9832   Chronic systolic heart failure ([place]) 02/15/2017   Dyspnea 01/07/2017   Essential hypertension 01/07/2017   Persistent atrial fibrillation ([place]) 03/15/2013   Long term (current) use of anticoagulants 03/15/2013   History of prostate cancer 02/16/2013    ONSET DATE: "some time"  REFERRING DIAG: hx of hereditary amyloidosis, HFpEF, persistent A-fib   THERAPY DIAG:  Unsteadiness on feet  Other abnormalities of gait and mobility  Muscle weakness (generalized)  Rationale for Evaluation and Treatment Rehabilitation  SUBJECTIVE:  SUBJECTIVE STATEMENT: Denies new issues. Last took his BP on Saturday and it was ~150/90. Denies symptoms today. Scheduled to see cardiologist tomorrow.   Pt accompanied by: self  PERTINENT HISTORY: cardiac PMH  PAIN:  Are you having pain? No  PRECAUTIONS: None  WEIGHT BEARING RESTRICTIONS No  FALLS: Has patient fallen in last 6 months? No  LIVING ENVIRONMENT: Lives with: lives alone Lives in: House/apartment Stairs: No Has following equipment at home: Single point cane  PLOF: Independent  PATIENT GOALS improve stamina and balance  OBJECTIVE:      TODAY'S TREATMENT: 11/08/22 Activity Comments  Vitals at start of session 94% spO2, 100 bpm; attempted several times  on LUE- able to get reading on R UE 143/105 mmHg  figure 8 walking   backwards walking 3x 57f Narrow BOS and short step length-improved with cues  walking with EC 3x582fTendency to veer R; short L step length  tandem walk 3x5012fnstability  Sidestepping + side stepping over hurdle 3x50f33feing to line feet up with hurdle and safe foot placement  Vitals after standing balance activities  140/118 mmHg, 69bpm  Instruction on diaphragmatic breathing    Vitals after breathing break 142/106 mmHg, 63 bpm  romberg EO/EC 30" Mild sway   romberg + head turns/nods to targets 30" Mild-mod sway  Vitals  152/125 mmHg  Diaphragmatic breathing break   Vitals  126/105 mmHg       PATIENT EDUCATION: Education details: encouraged continued monitoring of BP, to call 911 is red flag sx occur or BP elevates abnormally high; edu on FAST for stroke sx Person educated: Patient Education method: Explanation, Demonstration, Tactile cues, and Verbal cues Education comprehension: verbalized understanding and returned demonstration    Below measures were taken at time of initial evaluation unless otherwise specified:     DIAGNOSTIC FINDINGS: cardiac conditions monitored and diagnosed  COGNITION: Overall cognitive status: Within functional limits for tasks assessed   SENSATION: WFL  COORDINATION: WNL  EDEMA:  none  MUSCLE TONE: WNL   MUSCLE LENGTH: DNT  DTRs:    POSTURE: rounded shoulders and forward head  LOWER EXTREMITY ROM:     WNL  LOWER EXTREMITY MMT:    4/5 gross BLE strength  BED MOBILITY:    TRANSFERS: Assistive device utilized: None  Sit to stand: Complete Independence Stand to sit: Complete Independence Chair to chair: Complete Independence Floor:  DNT    STAIRS:  Level of Assistance: Modified independence  Stair Negotiation Technique: Alternating Pattern  with Single Rail on Right  Number of Stairs: 10   Height of Stairs: 4-6"  Comments:   GAIT: Gait  pattern: WFL Distance walked:  Assistive device utilized: None Level of assistance: Complete Independence Comments:   FUNCTIONAL TESTs:  5 times sit to stand: 20.56 Dynamic Gait Index: 13/24 Berg Balance Test: 40/56  M-CTSIB  Condition 1: Firm Surface, EO 30 Sec, Normal Sway  Condition 2: Firm Surface, EC 30 Sec, Mild and Moderate Sway  Condition 3: Foam Surface, EO 30 Sec, Normal Sway  Condition 4: Foam Surface, EC 30 Sec, Moderate Sway   Vestibular assessment: Saccades WNL Smooth pursuits WNL VOR x1 (slow and with some speed) WNL VOR cancellation WNL Head Impulse Test: negative right, positive left (slight) corrective saccade  PATIENT SURVEYS:      PATIENT EDUCATION: Education details: assessment findings, HEP initiation Person educated: Patient Education method: Explanation and Handouts Education comprehension: verbalized understanding and needs further education   HOME EXERCISE PROGRAM: Access Code: [email or web address] URL:  [email or web address] Date: 09/09/2022 Prepared by: Sherlyn Lees  Exercises - Corner Balance Feet Together With Eyes Open  - 1 x daily - 7 x weekly - 3 sets - 15-30 sec hold - Corner Balance Feet Together With Eyes Closed  - 1 x daily - 7 x weekly - 3-5 sets - 15-30 hold - Corner Balance Feet Together: Eyes Open With Head Turns  - 1 x daily - 7 x weekly - 5-10 reps - Semi-Tandem Corner Balance With Eyes Closed  - 1 x daily - 7 x weekly - 3-5 sets - 15-30 hold - Goblet Squat with Kettlebell  - 1 x daily - 7 x weekly - 5 sets - 5 reps - Farmer's Walk  - 1 x daily - 7 x weekly - 3 sets - 10 reps   GOALS: Goals reviewed with patient? Yes  SHORT TERM GOALS: Target date: 11/08/22  Patient will be independent in HEP to improve functional outcomes Baseline: Goal status: on-going    LONG TERM GOALS: Target date: 11/29/22  Demo low risk for falls per 19/24 Dynamic Gait Index Baseline: 13/24 Goal status: on-going  2.  Manifest  improved BLE strength and reduced risk for falls per 15 sec 5xSTS Baseline: 20 sec; (10/18/22) 21 sec Goal status: ongoing  3.  Manifest low risk for falls per score 52/56 Berg Balance to improve safety with mobility and ADL Baseline: 40/56; (10/18/22) 49/56 Goal status: On-going   ASSESSMENT:  CLINICAL IMPRESSION: Patient arrived to session without complaints. Vitals at start of session revealed elevated diastolic BP, but appropriate for exercise. Proceed with standing/walking balance activities with cueing for safe foot placement. Patient with tendency to veer to R/L with many activities today, requiring cueing to correct and improve awareness. Diastolic BP seemed to rise with short periods of activity but quickly dropped back down with sitting rest/breathing break. Notified cardiologist of elevated diastolic BP with activity and encouraged patient to call 911 if red flag symptoms occur or if BP elevates too high. Patient reported understanding of edu provided and without complaints at end of session.    OBJECTIVE IMPAIRMENTS cardiopulmonary status limiting activity, decreased activity tolerance, decreased balance, decreased endurance, decreased strength, dizziness, improper body mechanics, and postural dysfunction.   ACTIVITY LIMITATIONS lifting, standing, stairs, and locomotion level  PARTICIPATION LIMITATIONS: meal prep, cleaning, community activity, and yard work  PERSONAL FACTORS Age, Time since onset of injury/illness/exacerbation, and 1-2 comorbidities: cardiac conditions  are also affecting patient's functional outcome.   REHAB POTENTIAL: Good  CLINICAL DECISION MAKING: Evolving/moderate complexity  EVALUATION COMPLEXITY: Moderate  PLAN: PT FREQUENCY: 1-2x/week  PT DURATION: 6 weeks  PLANNED INTERVENTIONS: Therapeutic exercises, Therapeutic activity, Neuromuscular re-education, Balance training, Gait training, Patient/Family education, Self Care, Joint mobilization, Stair  training, Vestibular training, Canalith repositioning, DME instructions, Electrical stimulation, and Manual therapy  PLAN FOR NEXT SESSION: continue with muscular endurance exercises     Janene Harvey, PT, DPT 11/08/22 10:27 AM  [facility] at [facility] [address] [place], [place] [address] Phone # [phone] Fax # [phone]

## 2022-11-07 NOTE — Progress Notes (Unsigned)
Cardiology Office Note:   Date:  11/09/2022  NAME:  Jordan [name].    MRN: [id number] DOB:  Mar 20, 1946   PCP:  [name], MD  Cardiologist:  Pixie Casino, MD  Electrophysiologist:  None   Referring MD: [name],*   Chief Complaint  Patient presents with   Follow-up        History of Present Illness:   Jordan Dorfman. is a 76 y.o. male with below medical history who presents for follow-up.  He reports he is short of breath with activity.  His weight is down 10 pounds.  Reports no chest pain.  No swelling in his legs.  No bleeding on Eliquis.  Blood pressure 140/100 on my check.  We discussed going back on hydralazine.  I believe this will help.  He is on Vyndamax.  His sister tested negative for hereditary cardiac amyloidosis.  Overall seems to be stable.  She is having exertional shortness of breath.  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 Persistent Afib  -DCCV 07/07/2022 -> ERAF 07/21/2022 4. 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[place])    history of    Personal history of colonic polyps    Prostate cancer [facility])    radical prostatectomy     Past Surgical History: Past Surgical History:  Procedure Laterality Date   CARDIAC CATHETERIZATION  2007   no occlusive CAD   CARDIOVERSION N/A 10/07/2021   Procedure: CARDIOVERSION;  Surgeon: Pixie Casino, MD;  Location: [facility];  Service: Cardiovascular;  Laterality: N/A;   CARDIOVERSION N/A 07/07/2022   Procedure: CARDIOVERSION;  Surgeon:  Freada Bergeron, MD;  Location: [facility];  Service: Cardiovascular;  Laterality: N/A;   COLONOSCOPY  2002,2007   [name]   PROSTATECTOMY     PROSTATECTOMY  04-04   Dr. Risa Grill   TRANSTHORACIC ECHOCARDIOGRAM  05/2006   EF ~74%; LV systolic function normal; mild focal basal septal hypertrophy; AV thickness mildly increased; LA mod-markedly dilate    Current Medications: Current Meds  Medication Sig   allopurinol (ZYLOPRIM) 100 MG tablet Take 1 tablet (100 mg total) by mouth daily.   apixaban (ELIQUIS) 5 MG TABS tablet Take 1 tablet (5 mg total) by mouth 2 (two) times daily.   atorvastatin (LIPITOR) 40 MG tablet Take 1 tablet (40 mg total) by mouth daily.   Blood Pressure Monitor KIT Use as directed by your health care provider   carvedilol (COREG) 12.5 MG tablet Take 12.5 mg by mouth 2 (two) times daily with a meal.   FARXIGA 10 MG TABS tablet Take 10 mg by mouth daily.   furosemide (LASIX) 20 MG tablet Take 1 tablet (20 mg total) by mouth daily as needed for fluid or edema.   hydrALAZINE (APRESOLINE) 50 MG tablet Take 1 tablet (50 mg total) by mouth 3 (three) times daily.   losartan (COZAAR) 100 MG tablet Take 100 mg by mouth daily.   Tafamidis (  VYNDAMAX) 61 MG CAPS Take 61 mg (1 tablet) by mouth daily.     Allergies:    Patient has no known allergies.   Social History: Social History   Socioeconomic History   Marital status: Divorced    Spouse name: Not on file   Number of children: 1   Years of education: Not on file   Highest education level: Not on file  Occupational History   Occupation: retired    Fish farm manager: [employer]  Tobacco Use   Smoking status: Never   Smokeless tobacco: Never  Vaping Use   Vaping Use: Never used  Substance and Sexual Activity   Alcohol use: Not Currently    Alcohol/week: 3.0 standard drinks of alcohol    Types: 3 Glasses of wine per week    Comment: occasional    Drug use: No   Sexual activity: Yes  Other Topics  Concern   Not on file  Social History Narrative   Left Handed   Lives in a one story home   Drinks Caffeine - once a day    Social Determinants of Health   Financial Resource Strain: Not on file  Food Insecurity: Not on file  Transportation Needs: Not on file  Physical Activity: Not on file  Stress: Not on file  Social Connections: Not on file     Family History: The patient's family history includes Brain cancer in his mother; Colon cancer in his mother; Stroke in his father. There is no history of Rectal cancer, Stomach cancer, or Esophageal cancer.  ROS:   All other ROS reviewed and negative. Pertinent positives noted in the HPI.     EKGs/Labs/Other Studies Reviewed:   The following studies were personally reviewed by me today:  TTE 04/06/2022  1. Left ventricular ejection fraction, by estimation, is 40 to 45%. The  left ventricle has mildly decreased function. The left ventricle  demonstrates global hypokinesis. There is severe concentric left  ventricular hypertrophy.   2. Right ventricular systolic function is moderately reduced. The right  ventricular size is mildly enlarged. Moderately increased right  ventricular wall thickness.   3. Left atrial size was severely dilated.   4. Right atrial size was severely dilated.   5. The mitral valve is normal in structure. Mild mitral valve  regurgitation.   6. Aortic valve regurgitation is mild to moderate.   7. Consider work-up for amyloidosis given biventricular dysfunction,  severe LVH, severe biatrial enlargement if clinically indicated.   Recent Labs: 04/28/2022: ALT 27; B Natriuretic Peptide 772.8; Magnesium 2.0; TSH 1.486 06/24/2022: BUN 22; Creatinine, Ser 1.45; Hemoglobin 15.0; Platelets 206; Potassium 4.9; Sodium 140   Recent Lipid Panel    Component Value Date/Time   CHOL 150 04/28/2021 0446   CHOL 157 05/03/2018 1017   TRIG 51 04/28/2021 0446   HDL 37 (L) 04/28/2021 0446   HDL 55 05/03/2018 1017   CHOLHDL  4.1 04/28/2021 0446   VLDL 10 04/28/2021 0446   LDLCALC 103 (H) 04/28/2021 0446   LDLCALC 88 05/03/2018 1017    Physical Exam:   VS:  BP (!) 140/100   Pulse 64   Ht 6' (1.829 m)   Wt 190 lb 6.4 oz (86.4 kg)   SpO2 98%   BMI 25.82 kg/m    Wt Readings from Last 3 Encounters:  11/09/22 190 lb 6.4 oz (86.4 kg)  08/03/22 201 lb 12.8 oz (91.5 kg)  07/21/22 193 lb 3.2 oz (87.6 kg)    General:  Well nourished, well developed, in no acute distress Head: Atraumatic, normal size  Eyes: PEERLA, EOMI  Neck: Supple, no JVD Endocrine: No thryomegaly Cardiac: Normal S1, S2; irregular rhythm, normal rubs or gallops Lungs: Clear to auscultation bilaterally, no wheezing, rhonchi or rales  Abd: Soft, nontender, no hepatomegaly  Ext: No edema, pulses 2+ Musculoskeletal: No deformities, BUE and BLE strength normal and equal Skin: Warm and dry, no rashes   Neuro: Alert and oriented to person, place, time, and situation, CNII-XII grossly intact, no focal deficits  Psych: Normal mood and affect   ASSESSMENT:   Jordan Dettmann. is a 76 y.o. male who presents for the following: 1. 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 Chronic systolic heart failure (HCC)   3. Persistent atrial fibrillation ([place])   4. Acquired thrombophilia ([place])   5. Essential hypertension     PLAN:   1. 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 Chronic systolic heart failure (HCC) -Hereditary cardiac amyloidosis.  Ejection fraction 40-45%.  No signs of volume overload.  On Farxiga 10 mg daily.  On Lasix as needed.  3. Persistent atrial fibrillation ([place]) -Failed cardioversion.  Failed amiodarone.  Given cardiac amyloidosis we will just continue with rate control strategy.  Not a good candidate for cardiac ablation.  4. Acquired thrombophilia (HCC) -Continue Eliquis 5 mg twice daily.  5. Essential hypertension -BP still elevated.  Continue Coreg 12.5 mg twice daily, losartan 100 mg daily.  Add hydralazine 50 mg 3  times daily.  Check blood pressure twice per day.  See me back in 2 weeks.  Disposition: Return in about 2 weeks (around 11/23/2022).  Medication Adjustments/Labs and Tests Ordered: Current medicines are reviewed at length with the patient today.  Concerns regarding medicines are outlined above.  No orders of the defined types were placed in this encounter.  Meds ordered this encounter  Medications   hydrALAZINE (APRESOLINE) 50 MG tablet    Sig: Take 1 tablet (50 mg total) by mouth 3 (three) times daily.    Dispense:  270 tablet    Refill:  3    Patient Instructions  Medication Instructions:  START Hydralazine 50 three times daily   (Check blood pressure 2 times daily)  *If you need a refill on your cardiac medications before your next appointment, please call your pharmacy*   Follow-Up: At [facility], you and your health needs are our priority.  As part of our continuing mission to provide you with exceptional heart care, we have created designated Provider Care Teams.  These Care Teams include your primary Cardiologist (physician) and Advanced Practice Providers (APPs -  Physician Assistants and Nurse Practitioners) who all work together to provide you with the care you need, when you need it.  We recommend signing up for the patient portal called "MyChart".  Sign up information is provided on this After Visit Summary.  MyChart is used to connect with patients for Virtual Visits (Telemedicine).  Patients are able to view lab/test results, encounter notes, upcoming appointments, etc.  Non-urgent messages can be sent to your provider as well.   To learn more about what you can do with MyChart, go to [email or web address].    Your next appointment:   December 8th at 3:40 PM  The format for your next appointment:   In Person  Provider:   Eleonore Chiquito, MD         Time Spent with Patient: I have spent a total of 35 minutes with patient reviewing hospital  notes,  telemetry, EKGs, labs and examining the patient as well as establishing an assessment and plan that was discussed with the patient.  > 50% of time was spent in direct patient care.  Signed, Addison Naegeli. Audie Box, MD, [place]  [address] [place], [place] [address] [phone]  11/09/2022 10:10 AM

## 2022-11-08 ENCOUNTER — Telehealth: Payer: Self-pay | Admitting: Physical Therapy

## 2022-11-08 ENCOUNTER — Ambulatory Visit: Payer: Medicare Other | Admitting: Cardiovascular Disease

## 2022-11-08 ENCOUNTER — Encounter: Payer: Self-pay | Admitting: Physical Therapy

## 2022-11-08 ENCOUNTER — Ambulatory Visit: Payer: Medicare Other | Admitting: Physical Therapy

## 2022-11-08 DIAGNOSIS — R2681 Unsteadiness on feet: Secondary | ICD-10-CM

## 2022-11-08 DIAGNOSIS — R2689 Other abnormalities of gait and mobility: Secondary | ICD-10-CM

## 2022-11-08 DIAGNOSIS — M6281 Muscle weakness (generalized): Secondary | ICD-10-CM

## 2022-11-08 NOTE — Telephone Encounter (Signed)
Hi Dr. Audie Box,   Mr. Jordan [name] is being seen in OPPT s/p stroke and continues to struggle with elevated BP. You see him 11/09/22 for F/U. Today, resting BP was 143/105 mmHg and elevated as high as 152/125 mmHg with short periods of standing activities. Responded well to rest and came back down to baseline. Is there a BP cut-off that you recommend we stay below during exercise? Please advise.   Thanks!  Jordan [name], PT, DPT 11/08/22 10:30 AM  [facility] [address] [place], [place], [place] [address] Phone # [phone] Fax # [phone]

## 2022-11-09 ENCOUNTER — Other Ambulatory Visit (HOSPITAL_COMMUNITY): Payer: Self-pay

## 2022-11-09 ENCOUNTER — Encounter: Payer: Self-pay | Admitting: Cardiovascular Disease

## 2022-11-09 ENCOUNTER — Ambulatory Visit: Payer: Medicare Other | Attending: Cardiovascular Disease | Admitting: Cardiovascular Disease

## 2022-11-09 VITALS — BP 140/100 | HR 64 | Ht 72.0 in | Wt 190.4 lb

## 2022-11-09 DIAGNOSIS — E854 Organ-limited amyloidosis: Secondary | ICD-10-CM | POA: Diagnosis not present

## 2022-11-09 DIAGNOSIS — I10 Essential (primary) hypertension: Secondary | ICD-10-CM

## 2022-11-09 DIAGNOSIS — I5022 Chronic systolic (congestive) heart failure: Secondary | ICD-10-CM

## 2022-11-09 DIAGNOSIS — D6869 Other thrombophilia: Secondary | ICD-10-CM

## 2022-11-09 DIAGNOSIS — I4819 Other persistent atrial fibrillation: Secondary | ICD-10-CM

## 2022-11-09 DIAGNOSIS — I43 Cardiomyopathy in diseases classified elsewhere: Secondary | ICD-10-CM

## 2022-11-09 MED ORDER — HYDRALAZINE HCL 50 MG PO TABS: 50.0000 mg | ORAL_TABLET | Freq: Three times a day (TID) | ORAL | 3 refills | Status: DC

## 2022-11-09 NOTE — Patient Instructions (Signed)
Medication Instructions:  START Hydralazine 50 three times daily   (Check blood pressure 2 times daily)  *If you need a refill on your cardiac medications before your next appointment, please call your pharmacy*   Follow-Up: At [facility], you and your health needs are our priority.  As part of our continuing mission to provide you with exceptional heart care, we have created designated Provider Care Teams.  These Care Teams include your primary Cardiologist (physician) and Advanced Practice Providers (APPs -  Physician Assistants and Nurse Practitioners) who all work together to provide you with the care you need, when you need it.  We recommend signing up for the patient portal called "MyChart".  Sign up information is provided on this After Visit Summary.  MyChart is used to connect with patients for Virtual Visits (Telemedicine).  Patients are able to view lab/test results, encounter notes, upcoming appointments, etc.  Non-urgent messages can be sent to your provider as well.   To learn more about what you can do with MyChart, go to [email or web address].    Your next appointment:   December 8th at 3:40 PM  The format for your next appointment:   In Person  Provider:   Eleonore Chiquito, MD

## 2022-11-15 ENCOUNTER — Ambulatory Visit: Payer: Medicare Other

## 2022-11-22 ENCOUNTER — Ambulatory Visit: Payer: Medicare Other

## 2022-11-24 ENCOUNTER — Other Ambulatory Visit: Payer: Self-pay

## 2022-11-24 ENCOUNTER — Encounter (HOSPITAL_BASED_OUTPATIENT_CLINIC_OR_DEPARTMENT_OTHER): Payer: Self-pay

## 2022-11-24 ENCOUNTER — Emergency Department (HOSPITAL_BASED_OUTPATIENT_CLINIC_OR_DEPARTMENT_OTHER): Payer: Medicare Other

## 2022-11-24 ENCOUNTER — Emergency Department (HOSPITAL_BASED_OUTPATIENT_CLINIC_OR_DEPARTMENT_OTHER)
Admission: EM | Admit: 2022-11-24 | Discharge: 2022-11-24 | Disposition: A | Discharge status: Home / Self Care | Payer: Medicare Other | Attending: Emergency Medicine | Admitting: Emergency Medicine

## 2022-11-24 DIAGNOSIS — R42 Dizziness and giddiness: Secondary | ICD-10-CM | POA: Diagnosis present

## 2022-11-24 DIAGNOSIS — R0602 Shortness of breath: Secondary | ICD-10-CM | POA: Diagnosis not present

## 2022-11-24 DIAGNOSIS — Z7901 Long term (current) use of anticoagulants: Secondary | ICD-10-CM | POA: Diagnosis not present

## 2022-11-24 LAB — BRAIN NATRIURETIC PEPTIDE: B Natriuretic Peptide: 1604.7 pg/mL — ABNORMAL HIGH (ref 0.0–100.0)

## 2022-11-24 LAB — BASIC METABOLIC PANEL
Anion gap: 10 (ref 5–15)
BUN: 26 mg/dL — ABNORMAL HIGH (ref 8–23)
CO2: 21 mmol/L — ABNORMAL LOW (ref 22–32)
Calcium: 9.2 mg/dL (ref 8.9–10.3)
Chloride: 107 mmol/L (ref 98–111)
Creatinine, Ser: 1.59 mg/dL — ABNORMAL HIGH (ref 0.61–1.24)
GFR, Estimated: 45 mL/min — ABNORMAL LOW (ref >60)
Glucose, Bld: 121 mg/dL — ABNORMAL HIGH (ref 70–99)
Potassium: 4.6 mmol/L (ref 3.5–5.1)
Sodium: 138 mmol/L (ref 135–145)

## 2022-11-24 LAB — CBC
HCT: 46.6 % (ref 39.0–52.0)
Hemoglobin: 15.2 g/dL (ref 13.0–17.0)
MCH: 28.9 pg (ref 26.0–34.0)
MCHC: 32.6 g/dL (ref 30.0–36.0)
MCV: 88.6 fL (ref 80.0–100.0)
Platelets: 237 10*3/uL (ref 150–400)
RBC: 5.26 MIL/uL (ref 4.22–5.81)
RDW: 17.2 % — ABNORMAL HIGH (ref 11.5–15.5)
WBC: 5.7 10*3/uL (ref 4.0–10.5)
nRBC: 0 % (ref 0.0–0.2)

## 2022-11-24 LAB — HEPATIC FUNCTION PANEL
ALT: 17 U/L (ref 0–44)
AST: 25 U/L (ref 15–41)
Albumin: 3.5 g/dL (ref 3.5–5.0)
Alkaline Phosphatase: 69 U/L (ref 38–126)
Bilirubin, Direct: 0.6 mg/dL — ABNORMAL HIGH (ref 0.0–0.2)
Indirect Bilirubin: 1.3 mg/dL — ABNORMAL HIGH (ref 0.3–0.9)
Total Bilirubin: 1.9 mg/dL — ABNORMAL HIGH (ref 0.3–1.2)
Total Protein: 7.8 g/dL (ref 6.5–8.1)

## 2022-11-24 LAB — TROPONIN I (HIGH SENSITIVITY): Troponin I (High Sensitivity): 88 ng/L — ABNORMAL HIGH (ref <18)

## 2022-11-24 MED ORDER — SODIUM CHLORIDE 0.9 % IV BOLUS
1000.0000 mL | Freq: Once | INTRAVENOUS | Status: AC
  Administered 2022-11-24: 1000 mL via INTRAVENOUS

## 2022-11-24 MED ORDER — FUROSEMIDE 10 MG/ML IJ SOLN
20.0000 mg | Freq: Once | INTRAMUSCULAR | Status: AC
  Administered 2022-11-24: 20 mg via INTRAVENOUS
  Filled 2022-11-24: qty 2

## 2022-11-24 NOTE — ED Triage Notes (Signed)
Patient here POV from Home.  Endorses Ongoing Fatigue and SOB for a Few Months. Having Medications adjusted by PCP. Visited PCP today and had an abnormal EKG and was sent for Evaluation.  NAD Noted During Triage. A&Ox4. GCS 15. Ambulatory with Gilford Rile.

## 2022-11-24 NOTE — ED Provider Notes (Signed)
[name] EMERGENCY DEPT Provider Note   CSN: [id number] Arrival date & time: 11/24/22  1226     History  Chief Complaint  Patient presents with   Dizziness    Jordan Patella. is a 76 y.o. male.  76 yo M with a chief complaints of feeling dizzy.  This seems to occur especially when he gets up and walks around.  Seems to happen when he walks a little bit longer distance than normal tends to improve when he stops and takes of breath.  He has been having this issue going on for a week or 2.  On further thought he thinks maybe last month or 2.  He has been seeing his cardiologist and family doctor for this.  Was stopped on his hydralazine and then it was added back at some point.  He has a history of a PE and is on anticoagulation.  Denies any missed doses.  Feels like he has been eating and drinking normally.  Denies cough congestion or fever.   Dizziness      Home Medications Prior to Admission medications   Medication Sig Start Date End Date Taking? Authorizing Provider  allopurinol (ZYLOPRIM) 100 MG tablet Take 1 tablet (100 mg total) by mouth daily. 05/18/21   Angiulli, Lavon Paganini, PA-C  apixaban (ELIQUIS) 5 MG TABS tablet Take 1 tablet (5 mg total) by mouth 2 (two) times daily. 05/18/21   Angiulli, Lavon Paganini, PA-C  atorvastatin (LIPITOR) 40 MG tablet Take 1 tablet (40 mg total) by mouth daily. 05/18/21   Angiulli, Lavon Paganini, PA-C  Blood Pressure Monitor KIT Use as directed by your health care provider 10/09/21   Pixie Casino, MD  carvedilol (COREG) 12.5 MG tablet Take 12.5 mg by mouth 2 (two) times daily with a meal.    [provider]  FARXIGA 10 MG TABS tablet Take 10 mg by mouth daily. 03/13/22   [provider]  furosemide (LASIX) 20 MG tablet Take 1 tablet (20 mg total) by mouth daily as needed for fluid or edema. 04/29/22   Aline August, MD  hydrALAZINE (APRESOLINE) 50 MG tablet Take 1 tablet (50 mg total) by mouth 3 (three) times daily.  11/09/22 11/04/23  O'NealCassie Freer, MD  losartan (COZAAR) 100 MG tablet Take 100 mg by mouth daily. 05/16/22   [provider]  Tafamidis (VYNDAMAX) 61 MG CAPS Take 61 mg (1 tablet) by mouth daily. 08/05/22   O'Neal, Cassie Freer, MD      Allergies    Patient has no known allergies.    Review of Systems   Review of Systems  Neurological:  Positive for dizziness.    Physical Exam Updated Vital Signs BP (!) 140/121   Pulse (!) 112   Temp (!) 96.8 F (36 C) (Temporal)   Resp 11   Ht 6' (1.829 m)   Wt 86.4 kg   SpO2 97%   BMI 25.83 kg/m  Physical Exam Vitals and nursing note reviewed.  Constitutional:      Appearance: He is well-developed.  HENT:     Head: Normocephalic and atraumatic.  Eyes:     Pupils: Pupils are equal, round, and reactive to light.  Neck:     Vascular: No JVD.  Cardiovascular:     Rate and Rhythm: Normal rate and regular rhythm.     Heart sounds: No murmur heard.    No friction rub. No gallop.  Pulmonary:     Effort: No respiratory distress.  Breath sounds: No wheezing.  Abdominal:     General: There is no distension.     Tenderness: There is no abdominal tenderness. There is no guarding or rebound.  Musculoskeletal:        General: Normal range of motion.     Cervical back: Normal range of motion and neck supple.  Skin:    Coloration: Skin is not pale.     Findings: No rash.  Neurological:     Mental Status: He is alert and oriented to person, place, and time.     Cranial Nerves: Cranial nerves 2-12 are intact.     Sensory: Sensation is intact.     Motor: Motor function is intact.     Coordination: Coordination is intact.     Gait: Gait is intact.     Comments: Benign neurologic exam.  Able to ambulate without issue.  No ataxia.  Psychiatric:        Behavior: Behavior normal.     ED Results / Procedures / Treatments   Labs (all labs ordered are listed, but only abnormal results are displayed) Labs Reviewed  BASIC  METABOLIC PANEL - Abnormal; Notable for the following components:      Result Value   CO2 21 (*)    Glucose, Bld 121 (*)    BUN 26 (*)    Creatinine, Ser 1.59 (*)    GFR, Estimated 45 (*)    All other components within normal limits  CBC - Abnormal; Notable for the following components:   RDW 17.2 (*)    All other components within normal limits  HEPATIC FUNCTION PANEL - Abnormal; Notable for the following components:   Total Bilirubin 1.9 (*)    Bilirubin, Direct 0.6 (*)    Indirect Bilirubin 1.3 (*)    All other components within normal limits  BRAIN NATRIURETIC PEPTIDE - Abnormal; Notable for the following components:   B Natriuretic Peptide 1,604.7 (*)    All other components within normal limits  TROPONIN I (HIGH SENSITIVITY) - Abnormal; Notable for the following components:   Troponin I (High Sensitivity) 88 (*)    All other components within normal limits  TROPONIN I (HIGH SENSITIVITY)    EKG EKG Interpretation  Date/Time:  Wednesday November 24 2022 12:35:07 EST Ventricular Rate:  113 PR Interval:    QRS Duration: 109 QT Interval:  375 QTC Calculation: 512 R Axis:   258 Text Interpretation: Atrial flutter Ventricular premature complex Inferior infarct, old Abnormal lateral Q waves Anterior infarct, old Otherwise no significant change Confirmed by Deno Etienne [phone]) on 11/24/2022 1:00:03 PM  Radiology DG Chest Portable 1 View  Result Date: 11/24/2022 CLINICAL DATA:  SOB EXAM: PORTABLE CHEST 1 VIEW COMPARISON:  04/28/2022 FINDINGS: Cardiac silhouette is enlarged. There is pulmonary interstitial prominence with vascular congestion which is a new finding. No focal consolidation. No pneumothorax or pleural effusion identified. IMPRESSION: Findings suggest CHF. No focal consolidation. Electronically Signed   By: Sammie Bench M.D.   On: 11/24/2022 13:00    Procedures Procedures    Medications Ordered in ED Medications  sodium chloride 0.9 % bolus 1,000 mL (0 mLs  Intravenous Stopped 11/24/22 1440)  furosemide (LASIX) injection 20 mg (20 mg Intravenous Given 11/24/22 1425)    ED Course/ Medical Decision Making/ A&P                           Medical Decision Making Amount and/or Complexity of Data Reviewed  Labs: ordered. Radiology: ordered.  Risk Prescription drug management.   76 yo M with a chief complaint of dizziness.  By history it sounds more like a presyncopal feeling.  Seems to occur upon exertion seems to improve afterwards with rest.  Has been going on for couple weeks to maybe a couple months.  He denies any obvious infectious symptoms.  He was seen by his family doctor today, I was able to review their notes with the patient brought with him.  It seems they were having difficulty obtaining a blood pressure or feeling a pulse and were concerned and sent him here for evaluation.  Not obviously hypotensive on my initial evaluation.  He is in atrial fibrillation with good rate control.  Seems to be quite tachypneic on my exam though.  Not sure if this is worsening heart failure but seems a bit fluid down on exam.  Will give a small bolus of IV fluids.  Blood work chest x-ray reassess.  Patients workup most consistent with fluid overload.  Not obviously overloaded on exam.   CXR with increased edema from baseline as independently interpreted by me.  BNP elevated.  Trop 88 at baseline from last check.  Discussed with Dr. [name] Canner, thought ok for bolus of lasix, if improvement, d/c with close follow up with Dr. Marisue Ivan in the office.   Good UOP post bolus dose of lasix.  Patient feeling better requesting d/c.  2:43 PM:  I have discussed the diagnosis/risks/treatment options with the patient and family.  Evaluation and diagnostic testing in the emergency department does not suggest an emergent condition requiring admission or immediate intervention beyond what has been performed at this time.  They will follow up with Cards. We also discussed returning  to the ED immediately if new or worsening sx occur. We discussed the sx which are most concerning (e.g., sudden worsening pain, fever, inability to tolerate by mouth, worsening sob) that necessitate immediate return. Medications administered to the patient during their visit and any new prescriptions provided to the patient are listed below.  Medications given during this visit Medications  sodium chloride 0.9 % bolus 1,000 mL (0 mLs Intravenous Stopped 11/24/22 1440)  furosemide (LASIX) injection 20 mg (20 mg Intravenous Given 11/24/22 1425)     The patient appears reasonably screen and/or stabilized for discharge and I doubt any other medical condition or other [facility] requiring further screening, evaluation, or treatment in the ED at this time prior to discharge.          Final Clinical Impression(s) / ED Diagnoses Final diagnoses:  SOB (shortness of breath)    Rx / DC Orders ED Discharge Orders          Ordered    Ambulatory referral to Cardiology       Comments: If you have not heard from the Cardiology office within the next 72 hours please call [phone].   11/24/22 Burien, [place], DO 11/24/22 1443

## 2022-11-24 NOTE — Discharge Instructions (Addendum)
Take your lasix every day.  Call your cardiologist in the morning to set up an appointment.   Return for worsening sob, if you pass out or develop chest pain.

## 2022-11-29 ENCOUNTER — Ambulatory Visit: Payer: Medicare Other | Attending: Cardiovascular Disease

## 2022-11-29 ENCOUNTER — Other Ambulatory Visit (HOSPITAL_COMMUNITY): Payer: Self-pay

## 2022-11-29 DIAGNOSIS — R2681 Unsteadiness on feet: Secondary | ICD-10-CM | POA: Diagnosis present

## 2022-11-29 DIAGNOSIS — M6281 Muscle weakness (generalized): Secondary | ICD-10-CM | POA: Insufficient documentation

## 2022-11-29 DIAGNOSIS — R2689 Other abnormalities of gait and mobility: Secondary | ICD-10-CM | POA: Diagnosis present

## 2022-11-29 NOTE — Therapy (Signed)
OUTPATIENT PHYSICAL THERAPY NEURO TREATMENT, Progress Note, and Recertification   Patient Name: Jordan [name]. MRN: [id number] DOB:[birth year], 76 y.o., male Today's Date: 11/29/2022   PCP: [name] REFERRING PROVIDER: Geralynn Rile, MD    Progress Note Reporting Period 09/09/22 to 11/29/22  See note below for Objective Data and Assessment of Progress/Goals.      PT End of Session - 11/29/22 0931     Visit Number 8    Number of Visits 12    Date for PT Re-Evaluation 11/29/22    Authorization Type BCBS Medicare    Progress Note Due on Visit 15    PT Start Time 0930    PT Stop Time 1015    PT Time Calculation (min) 45 min    Equipment Utilized During Treatment Gait belt    Activity Tolerance Patient tolerated treatment well    Behavior During Therapy WFL for tasks assessed/performed              Past Medical History:  Diagnosis Date   Atrial fib/flutter, transient    Hx   Atrial fibrillation ([place])    coumadin   Colon cancer ([place])    family hx   Diverticulosis    Gout    Hemorrhoids    Hepatitis C    History of DVT (deep vein thrombosis)    History of nuclear stress test 04/2002   exercise; normal study    Hypertension    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[place])    history of    Personal history of colonic polyps    Prostate cancer ([place])    radical prostatectomy    Past Surgical History:  Procedure Laterality Date   CARDIAC CATHETERIZATION  2007   no occlusive CAD   CARDIOVERSION N/A 10/07/2021   Procedure: CARDIOVERSION;  Surgeon: Pixie Casino, MD;  Location: [place];  Service: Cardiovascular;  Laterality: N/A;   CARDIOVERSION N/A 07/07/2022   Procedure: CARDIOVERSION;  Surgeon: Freada Bergeron, MD;  Location: [facility] ENDOSCOPY;  Service: Cardiovascular;  Laterality: N/A;   COLONOSCOPY  2002,2007   [name]   PROSTATECTOMY     PROSTATECTOMY  04-04   Dr. Risa Grill   TRANSTHORACIC ECHOCARDIOGRAM  05/2006    EF ~56%; LV systolic function normal; mild focal basal septal hypertrophy; AV thickness mildly increased; LA mod-markedly dilate   Patient Active Problem List   Diagnosis Date Noted   Hypotension 04/28/2022   NSVT (nonsustained ventricular tachycardia) ([place]) 04/12/2022   Stage 3a chronic kidney disease ([place])    Chronic hepatitis C without hepatic coma ([place])    History of gout    Substance abuse ([place])    Right thalamic infarction ([place]) 04/27/2021   Personal history of colonic polyps 01/16/2019   Family history of colon cancer in mother 01/16/2019   Screening for lipid disorders 05/03/2018   NICM (nonischemic cardiomyopathy) ([place]) 43/32/9518   Chronic systolic heart failure ([place]) 02/15/2017   Dyspnea 01/07/2017   Essential hypertension 01/07/2017   Persistent atrial fibrillation ([place]) 03/15/2013   Long term (current) use of anticoagulants 03/15/2013   History of prostate cancer 02/16/2013    ONSET DATE: "some time"  REFERRING DIAG: hx of hereditary amyloidosis, HFpEF, persistent A-fib   THERAPY DIAG:  Unsteadiness on feet  Other abnormalities of gait and mobility  Muscle weakness (generalized)  Rationale for Evaluation and Treatment Rehabilitation  SUBJECTIVE:  SUBJECTIVE STATEMENT: Been having dizziness x 2 weeks, mainly comes on when getting up. Sitting still does not experience symptoms; reading, watching TV do not provoke. Pt reports a lightheaded feeling, like the ground is moving.   Pt accompanied by: self  PERTINENT HISTORY: cardiac PMH  PAIN:  Are you having pain? No  PRECAUTIONS: None  WEIGHT BEARING RESTRICTIONS No  FALLS: Has patient fallen in last 6 months? No  LIVING ENVIRONMENT: Lives with: lives alone Lives in: House/apartment Stairs: No Has following equipment  at home: Single point cane  PLOF: Independent  PATIENT GOALS improve stamina and balance  OBJECTIVE:  Vitals:  Seated: 147/102, 60 bpm At end of session: 139/111,   TODAY'S TREATMENT: 11/29/22 Activity Comments  Orthostatic assessment Supine:142/109 mmHg, 59 bpm Standing x 1 min: 137/100 mmHg, 72 bpm Standing x 3 min: 134/100 mmHg, 79 bpm  Dynamic balance Single limb support, narrow BOS, dynamic head movements  NU-step X 6 min with report of fatigue/SOB                 TODAY'S TREATMENT: 11/08/22 Activity Comments  Vitals at start of session 94% spO2, 100 bpm; attempted several times on LUE- able to get reading on R UE 143/105 mmHg  figure 8 walking   backwards walking 3x 21f Narrow BOS and short step length-improved with cues  walking with EC 3x554fTendency to veer R; short L step length  tandem walk [id number]  Sidestepping + side stepping over hurdle [id number] to line feet up with hurdle and safe foot placement  Vitals after standing balance activities  140/118 mmHg, 69bpm  Instruction on diaphragmatic breathing    Vitals after breathing break 142/106 mmHg, 63 bpm  romberg EO/EC 30" Mild sway   romberg + head turns/nods to targets 30" Mild-mod sway  Vitals  152/125 mmHg  Diaphragmatic breathing break   Vitals  126/105 mmHg       PATIENT EDUCATION: Education details: encouraged continued monitoring of BP, to call 911 is red flag sx occur or BP elevates abnormally high; edu on FAST for stroke sx Person educated: Patient Education method: Explanation, Demonstration, Tactile cues, and Verbal cues Education comprehension: verbalized understanding and returned demonstration    Below measures were taken at time of initial evaluation unless otherwise specified:     DIAGNOSTIC FINDINGS: cardiac conditions monitored and diagnosed  COGNITION: Overall cognitive status: Within functional limits for tasks  assessed   SENSATION: WFL  COORDINATION: WNL  EDEMA:  none  MUSCLE TONE: WNL   MUSCLE LENGTH: DNT  DTRs:    POSTURE: rounded shoulders and forward head  LOWER EXTREMITY ROM:     WNL  LOWER EXTREMITY MMT:    4/5 gross BLE strength  BED MOBILITY:    TRANSFERS: Assistive device utilized: None  Sit to stand: Complete Independence Stand to sit: Complete Independence Chair to chair: Complete Independence Floor:  DNT    STAIRS:  Level of Assistance: Modified independence  Stair Negotiation Technique: Alternating Pattern  with Single Rail on Right  Number of Stairs: 10   Height of Stairs: 4-6"  Comments:   GAIT: Gait pattern: WFL Distance walked:  Assistive device utilized: None Level of assistance: Complete Independence Comments:   FUNCTIONAL TESTs:  5 times sit to stand: 20.56 Dynamic Gait Index: 13/24 Berg Balance Test: 40/56  M-CTSIB  Condition 1: Firm Surface, EO 30 Sec, Normal Sway  Condition 2: Firm Surface, EC 30 Sec, Mild and Moderate Sway  Condition 3: Foam Surface,  EO 30 Sec, Normal Sway  Condition 4: Foam Surface, EC 30 Sec, Moderate Sway   Vestibular assessment: Saccades WNL Smooth pursuits WNL VOR x1 (slow and with some speed) WNL VOR cancellation WNL Head Impulse Test: negative right, positive left (slight) corrective saccade  PATIENT SURVEYS:      PATIENT EDUCATION: Education details: assessment findings, HEP initiation Person educated: Patient Education method: Explanation and Handouts Education comprehension: verbalized understanding and needs further education   HOME EXERCISE PROGRAM: Access Code: [email or web address] URL: [email or web address] Date: 09/09/2022 Prepared by: Sherlyn Lees  Exercises - Corner Balance Feet Together With Eyes Open  - 1 x daily - 7 x weekly - 3 sets - 15-30 sec hold - Corner Balance Feet Together With Eyes Closed  - 1 x daily - 7 x weekly - 3-5 sets - 15-30 hold - Corner Balance  Feet Together: Eyes Open With Head Turns  - 1 x daily - 7 x weekly - 5-10 reps - Semi-Tandem Corner Balance With Eyes Closed  - 1 x daily - 7 x weekly - 3-5 sets - 15-30 hold - Goblet Squat with Kettlebell  - 1 x daily - 7 x weekly - 5 sets - 5 reps - Farmer's Walk  - 1 x daily - 7 x weekly - 3 sets - 10 reps   GOALS: Goals reviewed with patient? Yes  SHORT TERM GOALS: Target date: 11/08/22  Patient will be independent in HEP to improve functional outcomes Baseline: Goal status: MET    LONG TERM GOALS: Target date:12/27/2022    Demo low risk for falls per 19/24 Dynamic Gait Index Baseline: 13/24 Goal status: on-going  2.  Manifest improved BLE strength and reduced risk for falls per 15 sec 5xSTS Baseline: 20 sec; (10/18/22) 21 sec Goal status: ongoing  3.  Manifest low risk for falls per score 52/56 Berg Balance to improve safety with mobility and ADL Baseline: 40/56; (10/18/22) 49/56 Goal status: On-going   ASSESSMENT:  CLINICAL IMPRESSION: Pt notes onset of feeling sensation of dizziness which is provoked with position change, most notably sit to stand. Further inquiry reveals pt notes feeling lightheaded with this movement and does not experience same symptoms with sit to supine or with watching TV, scrolling on phone/computer, or symptoms with rolling in bed.  Orthostatic hypotension assessment performed which is unrevealing for any dramatic change and pt denies feeling same symptoms.  Continued with activities to focus on improving single limb support/balance followed by activity for endurance which reveals SOB/fatigue but no similar sensation of lightheaded/dizzy.  Discussed with pt additional visits given his medical complexity and interruptions during POC related to these concurrent issues.  I feel that we should establish more concrete parameters for his activity tolerance and cardiac condition and conclude with a MD-approved HEP regimen.    OBJECTIVE IMPAIRMENTS  cardiopulmonary status limiting activity, decreased activity tolerance, decreased balance, decreased endurance, decreased strength, dizziness, improper body mechanics, and postural dysfunction.   ACTIVITY LIMITATIONS lifting, standing, stairs, and locomotion level  PARTICIPATION LIMITATIONS: meal prep, cleaning, community activity, and yard work  PERSONAL FACTORS Age, Time since onset of injury/illness/exacerbation, and 1-2 comorbidities: cardiac conditions  are also affecting patient's functional outcome.   REHAB POTENTIAL: Good  CLINICAL DECISION MAKING: Evolving/moderate complexity  EVALUATION COMPLEXITY: Moderate  PLAN: PT FREQUENCY: 1-2x/week  PT DURATION: 6 weeks  PLANNED INTERVENTIONS: Therapeutic exercises, Therapeutic activity, Neuromuscular re-education, Balance training, Gait training, Patient/Family education, Self Care, Joint mobilization, Stair training, Vestibular training, Canalith repositioning, DME instructions, Electrical stimulation, and  Manual therapy  PLAN FOR NEXT SESSION: continue with muscular endurance exercises    11:11 AM, 11/29/22 M. Sherlyn Lees, PT, DPT Physical Therapist- [place] Office Number: [phone]   [place] [facility] [address] [place], [place] [address] Phone # [phone] Fax # [phone]

## 2022-11-30 NOTE — Progress Notes (Signed)
Will watch your assessment Cardiology Office Note:   Date:  12/03/2022  NAME:  Jordan [name].    MRN: [id number] DOB:  June 28, 1946   PCP:  [name], MD  Cardiologist:  Pixie Casino, MD  Electrophysiologist:  None   Referring MD: [name],*   Chief Complaint  Patient presents with   Follow-up   History of Present Illness:   Jordan [name]. is a 76 y.o. male with a hx of hereditary cardiac amyloidosis, systolic heart failure, persistent atrial fibrillation, CKD stage IIIb who presents for follow-up.  Seen in the ER on 11/24/2022 for dizziness.  We have been adjusting his blood pressure medications. He was seen in the emergency room on 11/24/2022 for dizziness.  He was given fluids and symptoms improved.  He has known hereditary cardiac amyloidosis.  His BNP was severely elevated.  I would like to check an echocardiogram to determine what is going on.  Still having fluctuations in blood pressure.  BP in the 150s today.  We discussed going on Imdur.  His ejection fraction is borderline.  He also reports he has fatigue.  He has low energy.  I do wonder if he is having worsening congestive heart failure.  He appears euvolemic on exam.  We will get a better idea of volume status on his echo.  Continue with Lasix daily.  Denies chest pain or trouble breathing.  Just low energy and dizziness.  Also reports balance problems.  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 Persistent Afib  -DCCV 07/07/2022 -> ERAF 07/21/2022 4. CKD 3b  Past Medical History: Past Medical History:  Diagnosis Date   Atrial fib/flutter, transient    Hx   Atrial fibrillation ([place])    coumadin   Colon cancer (HCC)    family hx   Diverticulosis    Gout    Hemorrhoids    Hepatitis C    History of DVT (deep vein thrombosis)    History of nuclear stress test 04/2002   exercise; normal study    Hypertension     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[place])    history of    Personal history of colonic polyps    Prostate cancer [facility])    radical prostatectomy     Past Surgical History: Past Surgical History:  Procedure Laterality Date   CARDIAC CATHETERIZATION  2007   no occlusive CAD   CARDIOVERSION N/A 10/07/2021   Procedure: CARDIOVERSION;  Surgeon: Pixie Casino, MD;  Location: [place];  Service: Cardiovascular;  Laterality: N/A;   CARDIOVERSION N/A 07/07/2022   Procedure: CARDIOVERSION;  Surgeon: Freada Bergeron, MD;  Location: [place];  Service: Cardiovascular;  Laterality: N/A;   COLONOSCOPY  2002,2007   [name]   PROSTATECTOMY     PROSTATECTOMY  04-04   Dr. Risa Grill   TRANSTHORACIC ECHOCARDIOGRAM  05/2006   EF ~66%; LV systolic function normal; mild focal basal septal hypertrophy; AV thickness mildly increased; LA mod-markedly dilate    Current Medications: Current Meds  Medication Sig   allopurinol (ZYLOPRIM) 100 MG tablet Take 1 tablet (100 mg total) by mouth daily.   apixaban (ELIQUIS) 5 MG TABS tablet Take 1 tablet (5 mg total) by mouth 2 (two) times daily.   atorvastatin (LIPITOR) 40 MG tablet Take 1 tablet (40 mg total) by mouth daily.   Blood Pressure Monitor KIT Use as directed by your health care provider   carvedilol (COREG)  25 MG tablet Take 25 mg by mouth 2 (two) times daily.   FARXIGA 10 MG TABS tablet Take 10 mg by mouth daily.   furosemide (LASIX) 20 MG tablet Take 1 tablet (20 mg total) by mouth daily as needed for fluid or edema.   hydrALAZINE (APRESOLINE) 50 MG tablet Take 1 tablet (50 mg total) by mouth 3 (three) times daily.   isosorbide mononitrate (IMDUR) 30 MG 24 hr tablet Take 1 tablet (30 mg total) by mouth daily.   losartan (COZAAR) 100 MG tablet Take 100 mg by mouth daily.   Tafamidis (VYNDAMAX) 61 MG CAPS Take 61 mg (1 tablet) by mouth daily.     Allergies:    Patient has no known allergies.   Social History: Social  History   Socioeconomic History   Marital status: Divorced    Spouse name: Not on file   Number of children: 1   Years of education: Not on file   Highest education level: Not on file  Occupational History   Occupation: retired    Fish farm manager: [employer]  Tobacco Use   Smoking status: Never   Smokeless tobacco: Never  Vaping Use   Vaping Use: Never used  Substance and Sexual Activity   Alcohol use: Not Currently    Alcohol/week: 3.0 standard drinks of alcohol    Types: 3 Glasses of wine per week    Comment: occasional    Drug use: No   Sexual activity: Yes  Other Topics Concern   Not on file  Social History Narrative   Left Handed   Lives in a one story home   Drinks Caffeine - once a day    Social Determinants of Health   Financial Resource Strain: Not on file  Food Insecurity: Not on file  Transportation Needs: Not on file  Physical Activity: Not on file  Stress: Not on file  Social Connections: Not on file     Family History: The patient's family history includes Brain cancer in his mother; Colon cancer in his mother; Stroke in his father. There is no history of Rectal cancer, Stomach cancer, or Esophageal cancer.  ROS:   All other ROS reviewed and negative. Pertinent positives noted in the HPI.     EKGs/Labs/Other Studies Reviewed:   The following studies were personally reviewed by me today:   TTE 04/06/2022  1. Left ventricular ejection fraction, by estimation, is 40 to 45%. The  left ventricle has mildly decreased function. The left ventricle  demonstrates global hypokinesis. There is severe concentric left  ventricular hypertrophy.   2. Right ventricular systolic function is moderately reduced. The right  ventricular size is mildly enlarged. Moderately increased right  ventricular wall thickness.   3. Left atrial size was severely dilated.   4. Right atrial size was severely dilated.   5. The mitral valve is normal in structure. Mild mitral valve   regurgitation.   6. Aortic valve regurgitation is mild to moderate.   7. Consider work-up for amyloidosis given biventricular dysfunction,  severe LVH, severe biatrial enlargement if clinically indicated.   Recent Labs: 04/28/2022: Magnesium 2.0; TSH 1.486 11/24/2022: ALT 17; B Natriuretic Peptide 1,604.7; BUN 26; Creatinine, Ser 1.59; Hemoglobin 15.2; Platelets 237; Potassium 4.6; Sodium 138   Recent Lipid Panel    Component Value Date/Time   CHOL 150 04/28/2021 0446   CHOL 157 05/03/2018 1017   TRIG 51 04/28/2021 0446   HDL 37 (L) 04/28/2021 0446   HDL 55 05/03/2018 1017  CHOLHDL 4.1 04/28/2021 0446   VLDL 10 04/28/2021 0446   LDLCALC 103 (H) 04/28/2021 0446   LDLCALC 88 05/03/2018 1017    Physical Exam:   VS:  BP (!) 159/126 (BP Location: Right Arm, Patient Position: Sitting, Cuff Size: Normal)   Pulse 93   Ht 6' (1.829 m)   Wt 184 lb (83.5 kg)   SpO2 100%   BMI 24.95 kg/m    Wt Readings from Last 3 Encounters:  12/03/22 184 lb (83.5 kg)  11/24/22 190 lb 7.6 oz (86.4 kg)  11/09/22 190 lb 6.4 oz (86.4 kg)    General: Well nourished, well developed, in no acute distress Head: Atraumatic, normal size  Eyes: PEERLA, EOMI  Neck: Supple, no JVD Endocrine: No thryomegaly Cardiac: Normal S1, S2; irregular rhythm Lungs: Clear to auscultation bilaterally, no wheezing, rhonchi or rales  Abd: Soft, nontender, no hepatomegaly  Ext: No edema, pulses 2+ Musculoskeletal: No deformities, BUE and BLE strength normal and equal Skin: Warm and dry, no rashes   Neuro: Alert and oriented to person, place, time, and situation, CNII-XII grossly intact, no focal deficits  Psych: Normal mood and affect   ASSESSMENT:   Jordan [name]. is a 76 y.o. male who presents for the following: 1. 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 Chronic systolic heart failure (HCC)   3. Persistent atrial fibrillation ([place])   4. Acquired thrombophilia ([place])   5. Essential hypertension   6.  Imbalance     PLAN:   1. 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 Chronic systolic heart failure (HCC) -Reports dizziness and lightheadedness.  BNP was severely elevated in the emergency room.  I would like to recheck his echo.  He should continue with Lasix 20 mg daily.  Still having fluctuations in blood pressure.  Seems to be high more times than not.  Add Imdur 30 mg daily.  His ejection fraction was 45%.  Recheck echo pending.  Continue losartan 100 mg daily.  Continue Coreg 12.5 mg twice daily.  Continue hydralazine 50 mg 3 times daily.  Further titration based on how he does.  He should start checking his blood pressure daily. -If he continues to report dizziness we will check a monitor.  Would like to exclude bradycardia.  We will start repeat echo first.  3. Persistent atrial fibrillation ([place]) 4. Acquired thrombophilia ([place]) -Known history of atrial fibrillation.  Failed cardioversion on amiodarone.  Continue Eliquis 5 mg twice daily.  Rate control for now.  5. Essential hypertension -BP continues to be elevated.  Adding Imdur 30 mg daily.  6. Imbalance -Continue with physical therapy.  Likely related to blood pressure worsening cardiomyopathy.  Recheck echo is pending.  They be a good candidate for in future.  I will see him back in 1 month to discuss further.      Disposition: Return in about 1 month (around 01/03/2023).  Medication Adjustments/Labs and Tests Ordered: Current medicines are reviewed at length with the patient today.  Concerns regarding medicines are outlined above.  Orders Placed This Encounter  Procedures   ECHOCARDIOGRAM COMPLETE   Meds ordered this encounter  Medications   isosorbide mononitrate (IMDUR) 30 MG 24 hr tablet    Sig: Take 1 tablet (30 mg total) by mouth daily.    Dispense:  90 tablet    Refill:  3    Patient Instructions  Medication Instructions:  Start Imdur 30 mg daily   *If you need a refill on your cardiac medications before  your  next appointment, please call your pharmacy*  Testing/Procedures:  Echocardiogram - Your physician has requested that you have an echocardiogram. Echocardiography is a painless test that uses sound waves to create images of your heart. It provides your doctor with information about the size and shape of your heart and how well your heart's chambers and valves are working. This procedure takes approximately one hour. There are no restrictions for this procedure.    Follow-Up: At [facility], you and your health needs are our priority.  As part of our continuing mission to provide you with exceptional heart care, we have created designated Provider Care Teams.  These Care Teams include your primary Cardiologist (physician) and Advanced Practice Providers (APPs -  Physician Assistants and Nurse Practitioners) who all work together to provide you with the care you need, when you need it.  We recommend signing up for the patient portal called "MyChart".  Sign up information is provided on this After Visit Summary.  MyChart is used to connect with patients for Virtual Visits (Telemedicine).  Patients are able to view lab/test results, encounter notes, upcoming appointments, etc.  Non-urgent messages can be sent to your provider as well.   To learn more about what you can do with MyChart, go to [email or web address].    Your next appointment:   1 month(s)  The format for your next appointment:   In Person  Provider:   Eleonore Chiquito, MD          Time Spent with Patient: I have spent a total of 35 minutes with patient reviewing hospital notes, telemetry, EKGs, labs and examining the patient as well as establishing an assessment and plan that was discussed with the patient.  > 50% of time was spent in direct patient care.  Signed, Addison Naegeli. Audie Box, MD, [place]  [address] [place], [place] [address] [phone]  12/03/2022 4:40 PM

## 2022-12-01 ENCOUNTER — Other Ambulatory Visit (HOSPITAL_COMMUNITY): Payer: Self-pay

## 2022-12-01 ENCOUNTER — Other Ambulatory Visit: Payer: Self-pay | Admitting: Physician Assistant

## 2022-12-03 ENCOUNTER — Encounter: Payer: Self-pay | Admitting: Cardiovascular Disease

## 2022-12-03 ENCOUNTER — Ambulatory Visit: Payer: Medicare Other | Attending: Cardiovascular Disease | Admitting: Cardiovascular Disease

## 2022-12-03 VITALS — BP 159/126 | HR 93 | Ht 72.0 in | Wt 184.0 lb

## 2022-12-03 DIAGNOSIS — I4819 Other persistent atrial fibrillation: Secondary | ICD-10-CM | POA: Diagnosis not present

## 2022-12-03 DIAGNOSIS — I10 Essential (primary) hypertension: Secondary | ICD-10-CM

## 2022-12-03 DIAGNOSIS — I43 Cardiomyopathy in diseases classified elsewhere: Secondary | ICD-10-CM

## 2022-12-03 DIAGNOSIS — R2689 Other abnormalities of gait and mobility: Secondary | ICD-10-CM

## 2022-12-03 DIAGNOSIS — I5022 Chronic systolic (congestive) heart failure: Secondary | ICD-10-CM

## 2022-12-03 DIAGNOSIS — D6869 Other thrombophilia: Secondary | ICD-10-CM | POA: Diagnosis not present

## 2022-12-03 DIAGNOSIS — E854 Organ-limited amyloidosis: Secondary | ICD-10-CM | POA: Diagnosis not present

## 2022-12-03 MED ORDER — ISOSORBIDE MONONITRATE ER 30 MG PO TB24: 30.0000 mg | ORAL_TABLET | Freq: Every day | ORAL | 3 refills | Status: DC

## 2022-12-03 NOTE — Patient Instructions (Addendum)
Medication Instructions:  Start Imdur 30 mg daily   *If you need a refill on your cardiac medications before your next appointment, please call your pharmacy*  Testing/Procedures:  Echocardiogram - Your physician has requested that you have an echocardiogram. Echocardiography is a painless test that uses sound waves to create images of your heart. It provides your doctor with information about the size and shape of your heart and how well your heart's chambers and valves are working. This procedure takes approximately one hour. There are no restrictions for this procedure.    Follow-Up: At [facility], you and your health needs are our priority.  As part of our continuing mission to provide you with exceptional heart care, we have created designated Provider Care Teams.  These Care Teams include your primary Cardiologist (physician) and Advanced Practice Providers (APPs -  Physician Assistants and Nurse Practitioners) who all work together to provide you with the care you need, when you need it.  We recommend signing up for the patient portal called "MyChart".  Sign up information is provided on this After Visit Summary.  MyChart is used to connect with patients for Virtual Visits (Telemedicine).  Patients are able to view lab/test results, encounter notes, upcoming appointments, etc.  Non-urgent messages can be sent to your provider as well.   To learn more about what you can do with MyChart, go to [email or web address].    Your next appointment:   1 month(s)  The format for your next appointment:   In Person  Provider:   Eleonore Chiquito, MD

## 2022-12-09 ENCOUNTER — Ambulatory Visit: Payer: Medicare Other | Admitting: Nurse Practitioner

## 2022-12-13 ENCOUNTER — Ambulatory Visit: Payer: Medicare Other

## 2022-12-17 ENCOUNTER — Other Ambulatory Visit (HOSPITAL_COMMUNITY): Payer: Self-pay

## 2022-12-21 ENCOUNTER — Other Ambulatory Visit (HOSPITAL_COMMUNITY): Payer: Self-pay

## 2022-12-31 ENCOUNTER — Telehealth: Payer: Self-pay | Admitting: Pharmacist Clinician (PhC)/ Clinical Pharmacy Specialist

## 2022-12-31 NOTE — Telephone Encounter (Signed)
LMOM for patient to return call.

## 2022-12-31 NOTE — Telephone Encounter (Signed)
-----   Message from Geralynn Rile, MD sent at 12/21/2022  3:48 PM EST ----- Regarding: Amvuttra He is having a lot of balance issues which is a neurologic manifestation of his hereditary cardiac amyloid. Can you work to get him amvuttra?  [place] T. Audie Box, MD, [place]  [address], [place], [place] [address] [phone]  3:48 PM

## 2022-12-31 NOTE — Telephone Encounter (Signed)
Patient returned call.  We filled out application for [employer] and submitted to manufacturer.

## 2023-01-06 ENCOUNTER — Ambulatory Visit (HOSPITAL_COMMUNITY): Payer: Medicare Other | Attending: Cardiovascular Disease

## 2023-01-06 DIAGNOSIS — I5022 Chronic systolic (congestive) heart failure: Secondary | ICD-10-CM

## 2023-01-06 LAB — ECHOCARDIOGRAM COMPLETE
P 1/2 time: 449 msec
S' Lateral: 3.7 cm

## 2023-01-10 ENCOUNTER — Other Ambulatory Visit (HOSPITAL_COMMUNITY): Payer: Self-pay

## 2023-01-12 ENCOUNTER — Other Ambulatory Visit (HOSPITAL_COMMUNITY): Payer: Self-pay

## 2023-01-12 NOTE — Therapy (Signed)
[facility] [address], [place], [place], [address] Phone: [phone]   Fax:  [phone]  Patient Details  Name: Joshaua Epple. MRN: [id number] Date of Birth: 1946/02/07 Referring Provider:  No ref. provider found  Encounter Date: 01/12/2023 PHYSICAL THERAPY DISCHARGE SUMMARY  Visits from Start of Care: 8  Current functional level related to goals / functional outcomes: Progressing with STG/LTG   Remaining deficits: Balance, activity tolerance, safety with ambulation   Education / Equipment: HEP   Patient agrees to discharge. Patient goals were partially met. Patient is being discharged due to not returning since the last visit.   Toniann Fail, PT 01/12/2023, 2:19 PM  [facility] [place] [address] [place], [place], [address] Phone: [phone]   Fax:  [phone]

## 2023-01-20 ENCOUNTER — Ambulatory Visit: Payer: Medicare Other | Admitting: Internal Medicine

## 2023-01-20 NOTE — Progress Notes (Signed)
Cardiology Office Note:   Date:  01/24/2023  NAME:  Jordan [name].    MRN: [id number] DOB:  17-Mar-1946   PCP:  [name], MD  Cardiologist:  None  Electrophysiologist:  None   Referring MD: [name],*   Chief Complaint  Patient presents with   Follow-up        History of Present Illness:   Jordan [name]. is a 77 y.o. male with a hx of hereditary cardiac amyloidosis, systolic heart failure, EF 45 to 50%, persistent A-fib, failed cardioversion on amiodarone, CKD stage IIIb who presents for follow-up.  Blood pressure is elevated.  He reports it is maintaining high values at home.  He reports he is dizzy.  He reports no appetite.  He reports he gets full when he eats.  His blood pressure is 174/112.  We discussed increasing his hydralazine.  We also discussed vutrisiran.  We also discussed eplontersen.  Both are options.  I believe this would help him from a neurological standpoint.  He still reports significant balance issues as well as abdominal fullness.  He has trace lower extremity edema.  We discussed increasing his Lasix for 1 day.  His blood pressure also needs further evaluation.  I would also like for him to see advanced heart failure.  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 Persistent Afib  -DCCV 07/07/2022 -> ERAF 07/21/2022 4. 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[place])    history of    Personal history of colonic polyps    Prostate cancer [facility])    radical prostatectomy     Past Surgical  History: Past Surgical History:  Procedure Laterality Date   CARDIAC CATHETERIZATION  2007   no occlusive CAD   CARDIOVERSION N/A 10/07/2021   Procedure: CARDIOVERSION;  Surgeon: Pixie Casino, MD;  Location: [place];  Service: Cardiovascular;  Laterality: N/A;   CARDIOVERSION N/A 07/07/2022   Procedure: CARDIOVERSION;  Surgeon: Freada Bergeron, MD;  Location: [place];  Service: Cardiovascular;  Laterality: N/A;   COLONOSCOPY  2002,2007   [name]   PROSTATECTOMY     PROSTATECTOMY  04-04   Dr. Risa Grill   TRANSTHORACIC ECHOCARDIOGRAM  05/2006   EF ~44%; LV systolic function normal; mild focal basal septal hypertrophy; AV thickness mildly increased; LA mod-markedly dilate    Current Medications: Current Meds  Medication Sig   allopurinol (ZYLOPRIM) 100 MG tablet Take 1 tablet (100 mg total) by mouth daily.   apixaban (ELIQUIS) 5 MG TABS tablet Take 1 tablet (5 mg total) by mouth 2 (two) times daily.   atorvastatin (LIPITOR) 40 MG tablet Take 1 tablet (40 mg total) by mouth daily.   Blood Pressure Monitor KIT Use as directed by your health care provider   carvedilol (COREG) 25 MG tablet Take 25 mg by mouth 2 (two) times daily.   FARXIGA 10 MG TABS tablet Take 10 mg by mouth daily.   furosemide (LASIX) 20  MG tablet Take 1 tablet (20 mg total) by mouth daily as needed for fluid or edema.   hydrALAZINE (APRESOLINE) 50 MG tablet Take 1 tablet (50 mg total) by mouth 3 (three) times daily.   isosorbide mononitrate (IMDUR) 30 MG 24 hr tablet Take 1 tablet (30 mg total) by mouth daily.   losartan (COZAAR) 100 MG tablet Take 100 mg by mouth daily.   Tafamidis (VYNDAMAX) 61 MG CAPS Take 61 mg (1 tablet) by mouth daily.     Allergies:    Patient has no known allergies.   Social History: Social History   Socioeconomic History   Marital status: Divorced    Spouse name: Not on file   Number of children: 1   Years of education: Not on file   Highest education level: Not  on file  Occupational History   Occupation: retired    Fish farm manager: [employer]  Tobacco Use   Smoking status: Never   Smokeless tobacco: Never  Vaping Use   Vaping Use: Never used  Substance and Sexual Activity   Alcohol use: Not Currently    Alcohol/week: 3.0 standard drinks of alcohol    Types: 3 Glasses of wine per week    Comment: occasional    Drug use: No   Sexual activity: Yes  Other Topics Concern   Not on file  Social History Narrative   Left Handed   Lives in a one story home   Drinks Caffeine - once a day    Social Determinants of Health   Financial Resource Strain: Not on file  Food Insecurity: Not on file  Transportation Needs: Not on file  Physical Activity: Not on file  Stress: Not on file  Social Connections: Not on file     Family History: The patient's family history includes Brain cancer in his mother; Colon cancer in his mother; Stroke in his father. There is no history of Rectal cancer, Stomach cancer, or Esophageal cancer.  ROS:   All other ROS reviewed and negative. Pertinent positives noted in the HPI.     EKGs/Labs/Other Studies Reviewed:   The following studies were personally reviewed by me today:  TTE 01/06/2023  1. Left ventricular ejection fraction, by estimation, is 45 to 50%. The  left ventricle has mildly decreased function. The left ventricle  demonstrates global hypokinesis. There is severe concentric left  ventricular hypertrophy. Left ventricular diastolic   function could not be evaluated.   2. Right ventricular systolic function is normal. The right ventricular  size is moderately enlarged. There is moderately elevated pulmonary artery  systolic pressure. The estimated right ventricular systolic pressure is  44.3 mmHg.   3. Left atrial size was severely dilated.   4. Right atrial size was severely dilated.   5. The mitral valve is normal in structure. Mild mitral valve  regurgitation. No evidence of mitral stenosis.    6. The aortic valve is tricuspid. Aortic valve regurgitation is mild to  moderate. Aortic valve sclerosis/calcification is present, without any  evidence of aortic stenosis. Aortic regurgitation PHT measures 449 msec.   7. Aortic dilatation noted. There is borderline dilatation of the  ascending aorta, measuring 37 mm.   8. The inferior vena cava is normal in size with <50% respiratory  variability, suggesting right atrial pressure of 8 mmHg.   Recent Labs: 04/28/2022: Magnesium 2.0; TSH 1.486 11/24/2022: ALT 17; B Natriuretic Peptide 1,604.7; BUN 26; Creatinine, Ser 1.59; Hemoglobin 15.2; Platelets 237; Potassium 4.6; Sodium 138  Recent Lipid Panel    Component Value Date/Time   CHOL 150 04/28/2021 0446   CHOL 157 05/03/2018 1017   TRIG 51 04/28/2021 0446   HDL 37 (L) 04/28/2021 0446   HDL 55 05/03/2018 1017   CHOLHDL 4.1 04/28/2021 0446   VLDL 10 04/28/2021 0446   LDLCALC 103 (H) 04/28/2021 0446   LDLCALC 88 05/03/2018 1017    Physical Exam:   VS:  BP (!) 174/112   Pulse (!) 58   Ht 6' (1.829 m)   Wt 191 lb 12.8 oz (87 kg)   SpO2 100%   BMI 26.01 kg/m    Wt Readings from Last 3 Encounters:  01/21/23 191 lb 12.8 oz (87 kg)  12/03/22 184 lb (83.5 kg)  11/24/22 190 lb 7.6 oz (86.4 kg)    General: Well nourished, well developed, in no acute distress Head: Atraumatic, normal size  Eyes: PEERLA, EOMI  Neck: Supple, no JVD Endocrine: No thryomegaly Cardiac: Normal S1, S2; irregular rhythm, no murmurs Lungs: Clear to auscultation bilaterally, no wheezing, rhonchi or rales  Abd: Soft, nontender, no hepatomegaly  Ext: No edema, pulses 2+ Musculoskeletal: No deformities, BUE and BLE strength normal and equal Skin: Warm and dry, no rashes   Neuro: Alert and oriented to person, place, time, and situation, CNII-XII grossly intact, no focal deficits  Psych: Normal mood and affect   ASSESSMENT:   Jordan [name]. is a 77 y.o. male who presents for the following: 1.  Hereditary cardiac amyloidosis ([place])   2. Chronic systolic heart failure (HCC)   3. Persistent atrial fibrillation ([place])   4. Acquired thrombophilia ([place])   5. Essential hypertension   6. 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 Hereditary cardiac amyloidosis ([place]) 2. Chronic systolic heart failure ([place]) 3.  Familial amyloid polyneuropathy -Hereditary cardiac amyloidosis.  Genetically positive.  Ejection fraction around 45 to 50%.  He has failed amiodarone therapy.  He is in rate controlled A-fib.  His blood pressure is elevated.  We will increase his hydralazine to 100 mg 3 times daily.  Continue carvedilol 25 mg twice daily.  On Farxiga 10 mg daily.  He is also on losartan 100 mg daily.  Also on Imdur 30 mg daily.  He is slightly volume up.  Increase Lasix to 40 mg tomorrow.  Transition back to 20 mg daily after this.  Ejection fraction shows moderately elevated pulmonary pressures.  He also describes symptoms of imbalance as well as abdominal fullness.  I believe this is likely related to the neurological consequences of hereditary cardiac amyloidosis.  We have applied for Amvuttra.  Another option for him may be eplontersen.  Will have pharmacy determine what is a better option. -He is on Vyndamax. -Would also like for him to see the advanced heart failure team.  He continues to have symptoms of shortness of breath and dizziness.  He may merit right heart catheterization.  4. Persistent atrial fibrillation ([place]) 5. Acquired thrombophilia ([place]) -Failed cardioversion.  Failed amiodarone therapy.  Continue with rate control strategy.  On Eliquis 5 mg twice daily.  6. Essential hypertension -Increase hydralazine to 100 mg 3 times daily.  Continue Imdur 30 mg daily.  On losartan 100 mg daily.  On Farxiga 10 mg daily.  On Coreg 25 mg twice daily.      Disposition: Return in about 3 months (around 04/22/2023).  Medication Adjustments/Labs and Tests Ordered: Current medicines are  reviewed at length with the patient  today.  Concerns regarding medicines are outlined above.  Orders Placed This Encounter  Procedures   AMB referral to CHF clinic   No orders of the defined types were placed in this encounter.   Patient Instructions  Medication Instructions:  Take extra dose of Lasix (Furosemide) tomorrow (01/22/2023) = 40 mg.  Then resume 20 mg daily after that.    *If you need a refill on your cardiac medications before your next appointment, please call your pharmacy*   Follow-Up: At [facility], you and your health needs are our priority.  As part of our continuing mission to provide you with exceptional heart care, we have created designated Provider Care Teams.  These Care Teams include your primary Cardiologist (physician) and Advanced Practice Providers (APPs -  Physician Assistants and Nurse Practitioners) who all work together to provide you with the care you need, when you need it.  We recommend signing up for the patient portal called "MyChart".  Sign up information is provided on this After Visit Summary.  MyChart is used to connect with patients for Virtual Visits (Telemedicine).  Patients are able to view lab/test results, encounter notes, upcoming appointments, etc.  Non-urgent messages can be sent to your provider as well.   To learn more about what you can do with MyChart, go to [email or web address].    Your next appointment:   3 month(s)  Provider:   Eleonore Chiquito, MD   Referral to AHF clinic- they will reach out to schedule.    Time Spent with Patient: I have spent a total of 35 minutes with patient reviewing hospital notes, telemetry, EKGs, labs and examining the patient as well as establishing an assessment and plan that was discussed with the patient.  > 50% of time was spent in direct patient care.  Signed, Addison Naegeli. Audie Box, MD, [place]  [address], [place], [place] [address] [phone]  01/24/2023 10:16 AM

## 2023-01-21 ENCOUNTER — Ambulatory Visit: Payer: Medicare Other | Attending: Cardiovascular Disease | Admitting: Cardiovascular Disease

## 2023-01-21 ENCOUNTER — Encounter: Payer: Self-pay | Admitting: Cardiovascular Disease

## 2023-01-21 VITALS — BP 174/112 | HR 58 | Ht 72.0 in | Wt 191.8 lb

## 2023-01-21 DIAGNOSIS — G63 Polyneuropathy in diseases classified elsewhere: Secondary | ICD-10-CM

## 2023-01-21 DIAGNOSIS — E851 Neuropathic heredofamilial amyloidosis: Secondary | ICD-10-CM

## 2023-01-21 DIAGNOSIS — I4819 Other persistent atrial fibrillation: Secondary | ICD-10-CM | POA: Diagnosis not present

## 2023-01-21 DIAGNOSIS — D6869 Other thrombophilia: Secondary | ICD-10-CM | POA: Diagnosis not present

## 2023-01-21 DIAGNOSIS — I43 Cardiomyopathy in diseases classified elsewhere: Secondary | ICD-10-CM

## 2023-01-21 DIAGNOSIS — E854 Organ-limited amyloidosis: Secondary | ICD-10-CM

## 2023-01-21 DIAGNOSIS — I10 Essential (primary) hypertension: Secondary | ICD-10-CM

## 2023-01-21 DIAGNOSIS — I5022 Chronic systolic (congestive) heart failure: Secondary | ICD-10-CM | POA: Diagnosis not present

## 2023-01-21 NOTE — Patient Instructions (Addendum)
Medication Instructions:  Take extra dose of Lasix (Furosemide) tomorrow (01/22/2023) = 40 mg.  Then resume 20 mg daily after that.    *If you need a refill on your cardiac medications before your next appointment, please call your pharmacy*   Follow-Up: At [facility], you and your health needs are our priority.  As part of our continuing mission to provide you with exceptional heart care, we have created designated Provider Care Teams.  These Care Teams include your primary Cardiologist (physician) and Advanced Practice Providers (APPs -  Physician Assistants and Nurse Practitioners) who all work together to provide you with the care you need, when you need it.  We recommend signing up for the patient portal called "MyChart".  Sign up information is provided on this After Visit Summary.  MyChart is used to connect with patients for Virtual Visits (Telemedicine).  Patients are able to view lab/test results, encounter notes, upcoming appointments, etc.  Non-urgent messages can be sent to your provider as well.   To learn more about what you can do with MyChart, go to [email or web address].    Your next appointment:   3 month(s)  Provider:   Eleonore Chiquito, MD   Referral to AHF clinic- they will reach out to schedule.

## 2023-01-26 ENCOUNTER — Telehealth (HOSPITAL_COMMUNITY): Payer: Self-pay | Admitting: Vascular Surgery

## 2023-01-26 NOTE — Telephone Encounter (Signed)
Called pt to make new chf appt w/ db , no answer. No VM set up  will try back later

## 2023-01-27 ENCOUNTER — Other Ambulatory Visit (HOSPITAL_COMMUNITY): Payer: Self-pay

## 2023-02-01 ENCOUNTER — Encounter (HOSPITAL_COMMUNITY): Payer: Self-pay | Admitting: Internal Medicine

## 2023-02-01 ENCOUNTER — Ambulatory Visit (HOSPITAL_COMMUNITY)
Admission: RE | Admit: 2023-02-01 | Discharge: 2023-02-01 | Disposition: A | Discharge status: Home / Self Care | Payer: Medicare Other | Source: Ambulatory Visit | Attending: Internal Medicine | Admitting: Internal Medicine

## 2023-02-01 VITALS — BP 140/80 | HR 94 | Wt 195.0 lb

## 2023-02-01 DIAGNOSIS — I5022 Chronic systolic (congestive) heart failure: Secondary | ICD-10-CM | POA: Diagnosis not present

## 2023-02-01 NOTE — Progress Notes (Signed)
Error

## 2023-02-02 ENCOUNTER — Telehealth: Payer: Self-pay | Admitting: Pharmacist Clinician (PhC)/ Clinical Pharmacy Specialist

## 2023-02-02 ENCOUNTER — Other Ambulatory Visit (HOSPITAL_COMMUNITY): Payer: Self-pay

## 2023-02-02 NOTE — Telephone Encounter (Signed)
Dr. Audie Box would like to see if Jordan [name] would be covered on insurance for reasonable price.  Explained medication to patient over the phone, made him aware that the company may reach out to him in the next week to verify his information.    Patient voiced understanding.

## 2023-02-07 ENCOUNTER — Other Ambulatory Visit (HOSPITAL_COMMUNITY): Payer: Self-pay

## 2023-02-08 ENCOUNTER — Other Ambulatory Visit (HOSPITAL_COMMUNITY): Payer: Self-pay

## 2023-02-08 ENCOUNTER — Ambulatory Visit (HOSPITAL_COMMUNITY)
Admission: RE | Admit: 2023-02-08 | Discharge: 2023-02-08 | Disposition: A | Discharge status: Home / Self Care | Payer: Medicare Other | Source: Ambulatory Visit | Attending: Internal Medicine | Admitting: Internal Medicine

## 2023-02-08 ENCOUNTER — Encounter (HOSPITAL_COMMUNITY): Payer: Self-pay | Admitting: Internal Medicine

## 2023-02-08 VITALS — BP 150/80 | HR 89 | Wt 191.2 lb

## 2023-02-08 DIAGNOSIS — I5022 Chronic systolic (congestive) heart failure: Secondary | ICD-10-CM | POA: Diagnosis present

## 2023-02-08 DIAGNOSIS — I10 Essential (primary) hypertension: Secondary | ICD-10-CM

## 2023-02-08 DIAGNOSIS — I110 Hypertensive heart disease with heart failure: Secondary | ICD-10-CM | POA: Diagnosis not present

## 2023-02-08 DIAGNOSIS — I4819 Other persistent atrial fibrillation: Secondary | ICD-10-CM

## 2023-02-08 DIAGNOSIS — E851 Neuropathic heredofamilial amyloidosis: Secondary | ICD-10-CM

## 2023-02-08 DIAGNOSIS — G63 Polyneuropathy in diseases classified elsewhere: Secondary | ICD-10-CM

## 2023-02-08 LAB — BASIC METABOLIC PANEL
Anion gap: 12 (ref 5–15)
BUN: 29 mg/dL — ABNORMAL HIGH (ref 8–23)
CO2: 16 mmol/L — ABNORMAL LOW (ref 22–32)
Calcium: 9 mg/dL (ref 8.9–10.3)
Chloride: 112 mmol/L — ABNORMAL HIGH (ref 98–111)
Creatinine, Ser: 1.5 mg/dL — ABNORMAL HIGH (ref 0.61–1.24)
GFR, Estimated: 48 mL/min — ABNORMAL LOW (ref >60)
Glucose, Bld: 91 mg/dL (ref 70–99)
Potassium: 4 mmol/L (ref 3.5–5.1)
Sodium: 140 mmol/L (ref 135–145)

## 2023-02-08 LAB — BRAIN NATRIURETIC PEPTIDE: B Natriuretic Peptide: 2559 pg/mL — ABNORMAL HIGH (ref 0.0–100.0)

## 2023-02-08 MED ORDER — FUROSEMIDE 40 MG PO TABS: 40.0000 mg | ORAL_TABLET | Freq: Every day | ORAL | 6 refills | Status: DC

## 2023-02-08 MED ORDER — HYDRALAZINE HCL 100 MG PO TABS: 100.0000 mg | ORAL_TABLET | Freq: Three times a day (TID) | ORAL | 3 refills | Status: DC

## 2023-02-08 NOTE — Progress Notes (Signed)
ReDS Vest / Clip - 02/08/23 1300       ReDS Vest / Clip   Station Marker C    Ruler Value 29    ReDS Value Range High volume overload    ReDS Actual Value 43

## 2023-02-08 NOTE — Progress Notes (Signed)
ADVANCED HF CLINIC CONSULT NOTE  Referring Physician: [name], MD Primary Care: [name], MD Primary Cardiologist: Dr. Eleonore Chiquito  HPI:  Jordan [name]. is a 77 y.o. 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 Audie Box for further evaluation of his HF and cardiac amyloidosis.   Here with his family. Gets SOB and fatigued easily with ADLs. Says he doesn't even try to go to the mailbox because he doesn't think he can make it (it is 200 yards). No edema, orthopnea or PND. Family says he has not been very active for at least 5 years. 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 Non-smoker.   ReDS 43%  Review of Systems: [y] = yes, [ ]$  = no   General: Weight gain [ ]$ ; Weight loss [ ]$ ; Anorexia [ ]$ ; Fatigue [ y]; Fever [ ]$ ; Chills [ ]$ ; Weakness [ ]$   Cardiac: Chest pain/pressure [ ]$ ; Resting SOB Blue.Reese ]; Exertional SOB Blue.Reese ]; Orthopnea [ ]$ ; Pedal Edema Blue.Reese ]; Palpitations [ ]$ ; Syncope [ ]$ ; Presyncope [ ]$ ; Paroxysmal nocturnal dyspnea[ ]$   Pulmonary: Cough [ ]$ ; Wheezing[ ]$ ; Hemoptysis[ ]$ ; Sputum [ ]$ ; Snoring [ ]$   GI: Vomiting[ ]$ ; Dysphagia[ ]$ ; Melena[ ]$ ; Hematochezia [ ]$ ; Heartburn[ ]$ ; Abdominal pain [ ]$ ; Constipation [ ]$ ; Diarrhea [ ]$ ; BRBPR [ ]$   GU: Hematuria[ ]$ ; Dysuria [ ]$ ; Nocturia[ ]$   Vascular: Pain in legs with walking [ ]$ ; Pain in feet with lying flat [ ]$ ; Non-healing sores [ ]$ ; Stroke [ ]$ ; TIA [ ]$ ; Slurred speech [ ]$ ;  Neuro: Headaches[ ]$ ; Vertigo[ ]$ ; Seizures[ ]$ ; Paresthesias[ ]$ ;Blurred vision [ ]$ ; Diplopia [ ]$ ; Vision changes [ ]$   Ortho/Skin: Arthritis [ y]; Joint pain Blue.Reese ]; Muscle pain [ ]$ ; Joint swelling [ ]$ ; Back Pain [ ]$ ; Rash [ ]$   Psych: Depression[ ]$ ; Anxiety[ ]$   Heme: Bleeding problems [ ]$ ;  Clotting disorders [ ]$ ; Anemia [ ]$   Endocrine: Diabetes [ ]$ ; Thyroid dysfunction[ ]$    Past Medical History:  Diagnosis Date   Atrial fib/flutter, transient    Hx   Atrial fibrillation (HCC)    coumadin   Colon cancer (HCC)    family hx   Diverticulosis    Gout    Hemorrhoids    Hepatitis C    History of DVT (deep vein thrombosis)    History of nuclear stress test 04/2002   exercise; normal study    Hypertension    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HCC)    history of    Personal history of colonic polyps    Prostate cancer ([place])    radical prostatectomy     Current Outpatient Medications  Medication Sig Dispense Refill   allopurinol (ZYLOPRIM) 100 MG tablet Take 1 tablet (100 mg total) by mouth daily. 30 tablet 0   apixaban (ELIQUIS) 5 MG TABS tablet Take 1 tablet (5 mg total) by mouth 2 (two) times daily. 60 tablet 0   atorvastatin (LIPITOR) 40 MG tablet Take 1 tablet (40 mg total) by mouth daily. 30 tablet 0   Blood Pressure Monitor KIT Use as directed by your health care provider 1 kit 0   carvedilol (COREG) 25 MG tablet Take 25 mg by mouth 2 (two) times daily.  FARXIGA 10 MG TABS tablet Take 10 mg by mouth daily.     furosemide (LASIX) 20 MG tablet Take 1 tablet (20 mg total) by mouth daily as needed for fluid or edema.     hydrALAZINE (APRESOLINE) 50 MG tablet Take 1 tablet (50 mg total) by mouth 3 (three) times daily. 270 tablet 3   isosorbide mononitrate (IMDUR) 30 MG 24 hr tablet Take 1 tablet (30 mg total) by mouth daily. 90 tablet 3   losartan (COZAAR) 100 MG tablet Take 100 mg by mouth daily.     Tafamidis (VYNDAMAX) 61 MG CAPS Take 61 mg (1 tablet) by mouth daily. 30 capsule 11   No current facility-administered medications for this encounter.    No Known Allergies    Social History   Socioeconomic History   Marital status: Divorced    Spouse name: Not on file   Number of children: 1   Years of education: Not on file   Highest education  level: Not on file  Occupational History   Occupation: retired    Fish farm manager: [employer]  Tobacco Use   Smoking status: Never   Smokeless tobacco: Never  Vaping Use   Vaping Use: Never used  Substance and Sexual Activity   Alcohol use: Not Currently    Alcohol/week: 3.0 standard drinks of alcohol    Types: 3 Glasses of wine per week    Comment: occasional    Drug use: No   Sexual activity: Yes  Other Topics Concern   Not on file  Social History Narrative   Left Handed   Lives in a one story home   Drinks Caffeine - once a day    Social Determinants of Health   Financial Resource Strain: Not on file  Food Insecurity: Not on file  Transportation Needs: Not on file  Physical Activity: Not on file  Stress: Not on file  Social Connections: Not on file  Intimate Partner Violence: Not on file      Family History  Problem Relation Age of Onset   Brain cancer Mother        brain tumor   Colon cancer Mother    Stroke Father    Rectal cancer Neg Hx    Stomach cancer Neg Hx    Esophageal cancer Neg Hx     Vitals:   02/08/23 1223  BP: (!) 150/80  Pulse: 89  SpO2: 98%  Weight: 86.7 kg (191 lb 3.2 oz)    PHYSICAL EXAM: General:  Elderly male No resp difficulty HEENT: normal Neck: supple. JVp 8 . Carotids 2+ bilat; no bruits. No lymphadenopathy or thryomegaly appreciated. Cor: PMI nondisplaced. Irregular No rubs, gallops or murmurs. Lungs: clear Abdomen: soft, nontender, nondistended. No hepatosplenomegaly. No bruits or masses. Good bowel sounds. Extremities: no cyanosis, clubbing, rash, trace edema Neuro: alert & orientedx3, cranial nerves grossly intact. moves all 4 extremities w/o difficulty. Affect pleasant   ECG: AF narrow complex Personally reviewed   ASSESSMENT & PLAN:  1. Chronic HF with mildly reduced ejection fraction in setting of cardiac amyloidosis - echo 1/24 EF 45-50% RV normal. Mild to moderate AI - NYHA III - Volume status elevated. ReDS  49%. Needs further diuresis.  - Increase lasix to 3m daily - Continue carvedilol 25 bid - Continue Farxiga 10  - Continue losartan 100  - Increase hydralazine to 100 tid/Imdur 30  - If symptoms persist after diuresis will need R/L cath  2. 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 + VAL142Ile - On tafamadis - start eplontersen - Refer to [employer] counselor  3. Permanent AF - Failed DC-CV while on amiodarone  - rate controlled - Continue Eliquis  4. CKD 3B - baseline Scr ~1.5-1.8 - continue ARB/SGLT2i  5. HTN, uncontrolled - titrate hydralazine as above  Glori Bickers, MD  12:50 PM

## 2023-02-08 NOTE — Patient Instructions (Signed)
Medication Changes:  INCREASE Furosemide to 40 mg Daily  INCREASE Hydralazine to 100 mg Three times a day   Lab Work:  Labs done today, your results will be available in MyChart, we will contact you for abnormal readings.  Testing/Procedures:  none  Referrals:  You have been referred to the Advanced Hypertension Clinic, they will call you for an appointment  Special Instructions // Education:  Do the following things EVERYDAY: Weigh yourself in the morning before breakfast. Write it down and keep it in a log. Take your medicines as prescribed Eat low salt foods--Limit salt (sodium) to 2000 mg per day.  Stay as active as you can everyday Limit all fluids for the day to less than 2 liters   Follow-Up in: 6-8 weeks  At the [facility], you and your health needs are our priority. We have a designated team specialized in the treatment of Heart Failure. This Care Team includes your primary Heart Failure Specialized Cardiologist (physician), Advanced Practice Providers (APPs- Physician Assistants and Nurse Practitioners), and Pharmacist who all work together to provide you with the care you need, when you need it.   You may see any of the following providers on your designated Care Team at your next follow up:  Dr. Glori Bickers Dr. Loralie Champagne Dr. Roxana Hires, NP Lyda Jester, [place] [facility], [place] Forestine Na, NP Audry Riles, PharmD   Please be sure to bring in all your medications bottles to every appointment.   Need to Contact us:  If you have any questions or concerns before your next appointment please send [place] a message through [place] or call our office at [phone].    TO LEAVE A MESSAGE FOR THE NURSE SELECT OPTION 2, PLEASE LEAVE A MESSAGE INCLUDING: YOUR NAME DATE OF BIRTH CALL BACK NUMBER REASON FOR CALL**this is important as we prioritize the call backs  YOU WILL RECEIVE A CALL BACK THE SAME  DAY AS LONG AS YOU CALL BEFORE 4:00 PM

## 2023-02-14 NOTE — Telephone Encounter (Signed)
Pt c/o medication issue:  1. Name of Medication: [place]   2. How are you currently taking this medication (dosage and times per day)? N/A  3. Are you having a reaction (difficulty breathing--STAT)? N/A   4. What is your medication issue? Caryl Pina is calling with [employer] regarding PA for this medication. The key code for cover my meds is [id number]. If giving a verbal is preferred the number is [phone]. Please advise.

## 2023-02-15 NOTE — Telephone Encounter (Signed)
Patient phone with no VM. Called sister Pamala Hurry, [place] asking for her or Trevor to call back.   Patient needs to reach out to [place] regarding [place] prescription processing.  They need his permission to do insurance investigation.  He should call [place] (access specialist) at [phone] x [phone]

## 2023-02-16 ENCOUNTER — Other Ambulatory Visit (HOSPITAL_COMMUNITY): Payer: Self-pay

## 2023-02-17 NOTE — Telephone Encounter (Signed)
Spoke with patient, explained medication [place] and why Dr. Audie Box chose this over [place].  Answered all patient questions.

## 2023-02-18 ENCOUNTER — Other Ambulatory Visit (HOSPITAL_COMMUNITY): Payer: Self-pay

## 2023-02-24 ENCOUNTER — Ambulatory Visit (HOSPITAL_BASED_OUTPATIENT_CLINIC_OR_DEPARTMENT_OTHER): Payer: Medicare Other | Admitting: Family

## 2023-02-24 ENCOUNTER — Telehealth: Payer: Self-pay | Admitting: Cardiovascular Disease

## 2023-02-24 NOTE — Telephone Encounter (Signed)
Routed to clinical pharmacy team

## 2023-02-24 NOTE — Telephone Encounter (Signed)
Pt c/o medication issue:  1. Name of Medication: [place]  2. How are you currently taking this medication (dosage and times per day)?  Not taking yet  3. Are you having a reaction (difficulty breathing--STAT)?   4. What is your medication issue?   Caller stated patient's grant does not qualify for this medication. Caller wants call back to confirm update.

## 2023-02-25 NOTE — Telephone Encounter (Signed)
Pt will look for red/white/blue medicare card and call with the ID number

## 2023-03-01 ENCOUNTER — Other Ambulatory Visit: Payer: Self-pay | Admitting: Cardiovascular Disease

## 2023-03-03 MED ORDER — WAINUA 45 MG/0.8ML ~~LOC~~ SOAJ: 45.0000 mg | SUBCUTANEOUS | 12 refills | Status: DC

## 2023-03-03 NOTE — Telephone Encounter (Signed)
Received permission from patient to apply for assistance through [place] [employer] [place].  He was approved to 12/27/2023  Prescription sent to [facility]

## 2023-03-15 ENCOUNTER — Other Ambulatory Visit (HOSPITAL_COMMUNITY): Payer: Self-pay

## 2023-03-17 ENCOUNTER — Other Ambulatory Visit (HOSPITAL_COMMUNITY): Payer: Self-pay

## 2023-03-21 ENCOUNTER — Other Ambulatory Visit (HOSPITAL_COMMUNITY): Payer: Self-pay

## 2023-03-23 ENCOUNTER — Other Ambulatory Visit (HOSPITAL_COMMUNITY): Payer: Self-pay

## 2023-03-23 NOTE — Progress Notes (Deleted)
Advanced Hypertension Clinic Initial Assessment:    Date:  03/23/2023   ID:  Jordan Kelch., DOB [birth year], MRN [id number]  PCP:  [name], MD  Cardiologist:  None  Nephrologist:  Referring MD: Jolaine Artist, MD   CC: Hypertension  History of Present Illness:    Jordan [name]. is a 77 y.o. 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   He saw Dr. Haroldine Laws 02/08/23 with home BP 170s/100s. Most recent echo 12/2022 LVEF 45-50% with normal RV. His RedS vest was elevated and Lasix increased to 40mg  QD. Hydralazine increased to 100mg  TID. Carvedilol 25mg  BID, Farxiga 10mg  QD, Imdur 30mg  QD, Losartan 100mg  continued. Started on Eplontersen for amyloidosis.   Previous antihypertensives:  Secondary Causes of Hypertension  Medications/Herbal: OCP, steroids, stimulants, antidepressants, weight loss medication, immune suppressants, NSAIDs, sympathomimetics, alcohol, caffeine, licorice, ginseng, St. John's wort, chemo  Sleep Apnea Renal artery stenosis Hyperaldosteronism Hyper/hypothyroidism Pheochromocytoma: palpitations, tachycardia, headache, diaphoresis (plasma metanephrines) Cushing's syndrome: Cushingoid facies, central obesity, proximal muscle weakness, and ecchymoses, adrenal incidentaloma (cortisol) Coarctation of the aorta  Past Medical History:  Diagnosis Date   Atrial fib/flutter, transient    Hx   Atrial fibrillation (HCC)    coumadin   Colon cancer (HCC)    family hx   Diverticulosis    Gout    Hemorrhoids    Hepatitis C    History of DVT (deep vein thrombosis)    History of nuclear stress test 04/2002   exercise; normal study    Hypertension    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[place])    history of     Personal history of colonic polyps    Prostate cancer [facility])    radical prostatectomy     Past Surgical History:  Procedure Laterality Date   CARDIAC CATHETERIZATION  2007   no occlusive CAD   CARDIOVERSION N/A 10/07/2021   Procedure: CARDIOVERSION;  Surgeon: Pixie Casino, MD;  Location: [place];  Service: Cardiovascular;  Laterality: N/A;   CARDIOVERSION N/A 07/07/2022   Procedure: CARDIOVERSION;  Surgeon: Freada Bergeron, MD;  Location: [facility] ENDOSCOPY;  Service: Cardiovascular;  Laterality: N/A;   COLONOSCOPY  2002,2007   Jordan [name]   PROSTATECTOMY     PROSTATECTOMY  04-04   Dr. Risa Grill   TRANSTHORACIC ECHOCARDIOGRAM  05/2006   EF [id number]; LV systolic function normal; mild focal basal septal hypertrophy; AV thickness mildly increased; LA mod-markedly dilate    Current Medications: No outpatient medications have been marked as taking for the 03/24/23 encounter (Appointment) with Loel Dubonnet, NP.     Allergies:   Patient has no known allergies.   Social History   Socioeconomic History   Marital status: Divorced    Spouse name: Not on file   Number of children: 1   Years of education: Not on file   Highest education level: Not on file  Occupational History   Occupation: retired    Fish farm manager: [employer]  Tobacco Use   Smoking status: Never   Smokeless tobacco: Never  Vaping Use   Vaping Use: Never used  Substance and Sexual Activity   Alcohol use: Not Currently    Alcohol/week: 3.0 standard drinks of alcohol    Types: 3 Glasses of wine per week    Comment: occasional  Drug use: No   Sexual activity: Yes  Other Topics Concern   Not on file  Social History Narrative   Left Handed   Lives in a one story home   Drinks Caffeine - once a day    Social Determinants of Health   Financial Resource Strain: Not on file  Food Insecurity: Not on file  Transportation Needs: Not on file  Physical Activity: Not on file  Stress: Not on file  Social  Connections: Not on file     Family History: The patient's ***family history includes Brain cancer in his mother; Colon cancer in his mother; Stroke in his father. There is no history of Rectal cancer, Stomach cancer, or Esophageal cancer.  ROS:   Please see the history of present illness.    *** All other systems reviewed and are negative.  EKGs/Labs/Other Studies Reviewed:    EKG:  EKG is *** ordered today.  The ekg ordered today demonstrates ***  Recent Labs: 04/28/2022: Magnesium 2.0; TSH 1.486 11/24/2022: ALT 17; Hemoglobin 15.2; Platelets 237 02/08/2023: B Natriuretic Peptide 2,559.0; BUN 29; Creatinine, Ser 1.50; Potassium 4.0; Sodium 140   Recent Lipid Panel    Component Value Date/Time   CHOL 150 04/28/2021 0446   CHOL 157 05/03/2018 1017   TRIG 51 04/28/2021 0446   HDL 37 (L) 04/28/2021 0446   HDL 55 05/03/2018 1017   CHOLHDL 4.1 04/28/2021 0446   VLDL 10 04/28/2021 0446   LDLCALC 103 (H) 04/28/2021 0446   LDLCALC 88 05/03/2018 1017    Physical Exam:   VS:  There were no vitals taken for this visit. , BMI There is no height or weight on file to calculate BMI. GENERAL:  Well appearing HEENT: Pupils equal round and reactive, fundi not visualized, oral mucosa unremarkable NECK:  No jugular venous distention, waveform within normal limits, carotid upstroke brisk and symmetric, no bruits, no thyromegaly LYMPHATICS:  No cervical adenopathy LUNGS:  Clear to auscultation bilaterally HEART:  RRR.  PMI not displaced or sustained,S1 and S2 within normal limits, no S3, no S4, no clicks, no rubs, *** murmurs ABD:  Flat, positive bowel sounds normal in frequency in pitch, no bruits, no rebound, no guarding, no midline pulsatile mass, no hepatomegaly, no splenomegaly EXT:  2 plus pulses throughout, no edema, no cyanosis no clubbing SKIN:  No rashes no nodules NEURO:  Cranial nerves II through XII grossly intact, motor grossly intact throughout PSYCH:  Cognitively intact, oriented  to person place and time   ASSESSMENT/PLAN:    HTN -   HFmrEF / Cardiac amyloidosis -   Permanent atrial fibrillation / Hypercoagulable state / On Amiodarone therapy -   CKD 3B -   Screening for Secondary Hypertension: { Click here to document screening for secondary causes of HTN  :[id number]    Relevant Labs/Studies:    Latest Ref Rng & Units 02/08/2023    1:44 PM 11/24/2022   12:42 PM 06/24/2022    3:52 PM  Basic Labs  Sodium 135 - 145 mmol/L 140  138  140   Potassium 3.5 - 5.1 mmol/L 4.0  4.6  4.9   Creatinine 0.61 - 1.24 mg/dL 1.50  1.59  1.45        Latest Ref Rng & Units 04/28/2022   11:30 PM 04/27/2021   10:13 PM  Thyroid   TSH 0.350 - 4.500 uIU/mL 1.486  2.994  he consents to be monitored in our remote patient monitoring program through [place].  he will track his blood pressure twice daily and understands that these trends will help [place] to adjust his medications as needed prior to his next appointment.  he *** interested in enrolling in the PREP exercise and nutrition program through the [facility].     Disposition:    FU with MD/PharmD in {gen number [id number] {Days to years:10300}    Medication Adjustments/Labs and Tests Ordered: Current medicines are reviewed at length with the patient today.  Concerns regarding medicines are outlined above.  No orders of the defined types were placed in this encounter.  No orders of the defined types were placed in this encounter.    Signed, Loel Dubonnet, NP  03/23/2023 2:38 PM    [facility]

## 2023-03-24 ENCOUNTER — Ambulatory Visit (HOSPITAL_BASED_OUTPATIENT_CLINIC_OR_DEPARTMENT_OTHER): Payer: Medicare Other | Admitting: Family

## 2023-04-11 ENCOUNTER — Ambulatory Visit (HOSPITAL_COMMUNITY)
Admission: RE | Admit: 2023-04-11 | Discharge: 2023-04-11 | Disposition: A | Discharge status: Home / Self Care | Payer: Medicare Other | Source: Ambulatory Visit | Attending: Internal Medicine | Admitting: Internal Medicine

## 2023-04-11 ENCOUNTER — Encounter (HOSPITAL_COMMUNITY): Payer: Self-pay | Admitting: Internal Medicine

## 2023-04-11 VITALS — BP 102/60 | HR 82 | Wt 196.0 lb

## 2023-04-11 DIAGNOSIS — I43 Cardiomyopathy in diseases classified elsewhere: Secondary | ICD-10-CM | POA: Insufficient documentation

## 2023-04-11 DIAGNOSIS — N1832 Chronic kidney disease, stage 3b: Secondary | ICD-10-CM | POA: Insufficient documentation

## 2023-04-11 DIAGNOSIS — Z79899 Other long term (current) drug therapy: Secondary | ICD-10-CM | POA: Diagnosis not present

## 2023-04-11 DIAGNOSIS — Z7984 Long term (current) use of oral hypoglycemic drugs: Secondary | ICD-10-CM | POA: Diagnosis not present

## 2023-04-11 DIAGNOSIS — I4819 Other persistent atrial fibrillation: Secondary | ICD-10-CM | POA: Diagnosis not present

## 2023-04-11 DIAGNOSIS — N183 Chronic kidney disease, stage 3 unspecified: Secondary | ICD-10-CM

## 2023-04-11 DIAGNOSIS — I4821 Permanent atrial fibrillation: Secondary | ICD-10-CM | POA: Diagnosis not present

## 2023-04-11 DIAGNOSIS — E854 Organ-limited amyloidosis: Secondary | ICD-10-CM | POA: Insufficient documentation

## 2023-04-11 DIAGNOSIS — Z7901 Long term (current) use of anticoagulants: Secondary | ICD-10-CM | POA: Insufficient documentation

## 2023-04-11 DIAGNOSIS — I10 Essential (primary) hypertension: Secondary | ICD-10-CM

## 2023-04-11 DIAGNOSIS — I5022 Chronic systolic (congestive) heart failure: Secondary | ICD-10-CM | POA: Insufficient documentation

## 2023-04-11 DIAGNOSIS — I130 Hypertensive heart and chronic kidney disease with heart failure and stage 1 through stage 4 chronic kidney disease, or unspecified chronic kidney disease: Secondary | ICD-10-CM | POA: Diagnosis not present

## 2023-04-11 DIAGNOSIS — Z8673 Personal history of transient ischemic attack (TIA), and cerebral infarction without residual deficits: Secondary | ICD-10-CM | POA: Insufficient documentation

## 2023-04-11 LAB — BASIC METABOLIC PANEL
Anion gap: 8 (ref 5–15)
BUN: 25 mg/dL — ABNORMAL HIGH (ref 8–23)
CO2: 18 mmol/L — ABNORMAL LOW (ref 22–32)
Calcium: 8.8 mg/dL — ABNORMAL LOW (ref 8.9–10.3)
Chloride: 112 mmol/L — ABNORMAL HIGH (ref 98–111)
Creatinine, Ser: 1.69 mg/dL — ABNORMAL HIGH (ref 0.61–1.24)
GFR, Estimated: 42 mL/min — ABNORMAL LOW (ref >60)
Glucose, Bld: 112 mg/dL — ABNORMAL HIGH (ref 70–99)
Potassium: 4 mmol/L (ref 3.5–5.1)
Sodium: 138 mmol/L (ref 135–145)

## 2023-04-11 LAB — BRAIN NATRIURETIC PEPTIDE: B Natriuretic Peptide: 1930.8 pg/mL — ABNORMAL HIGH (ref 0.0–100.0)

## 2023-04-11 MED ORDER — HYDRALAZINE HCL 100 MG PO TABS: 50.0000 mg | ORAL_TABLET | Freq: Two times a day (BID) | ORAL | 2 refills | Status: DC

## 2023-04-11 NOTE — Progress Notes (Signed)
ADVANCED HF CLINIC NOTE  Referring Physician: Lorenda Ishihara, MD Primary Care: Lorenda Ishihara, MD Primary Cardiologist: Dr. Lennie Odor  HPI:  Jordan [name]. is a 76 y.o. male with a hx of HTN, CVA (4/22), systolic HF due to hereditary cardiac amyloidosis (PYP+, Val142Ile mutation), EF 45 to 50%, persistent A-fib, failed cardioversion on amiodarone, CKD stage IIIb who is referred by Dr. Flora Lipps for further evaluation of his HF and cardiac amyloidosis.   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 Non-smoker.   ReDS 43% at last visit. ReDS today 36%  Here with his family. Says he still gets tired with mild activity but feels it has gotten some better. Denies significant SOB. No orthopnea or PND. Mild swelling in feet. Taking lasix 40 daily. Mild dizziness when he stands. No falls    Past Medical History:  Diagnosis Date   Atrial fib/flutter, transient    Hx   Atrial fibrillation    coumadin   Colon cancer    family hx   Diverticulosis    Gout    Hemorrhoids    Hepatitis C    History of DVT (deep vein thrombosis)    History of nuclear stress test 04/2002   exercise; normal study    Hypertension    Non-obstructive hypertrophic cardiomyopathy    history of    Personal history of colonic polyps    Prostate cancer    radical prostatectomy     Current Outpatient Medications  Medication Sig Dispense Refill   allopurinol (ZYLOPRIM) 100 MG tablet Take 1 tablet (100 mg total) by mouth daily. 30 tablet 0   apixaban (ELIQUIS) 5 MG TABS tablet Take 1 tablet (5 mg total) by mouth 2 (two) times daily. 60 tablet 0   atorvastatin (LIPITOR) 40 MG tablet Take 1 tablet (40 mg total) by mouth daily. 30 tablet 0   Blood Pressure Monitor KIT Use as directed by your health care provider 1 kit 0   carvedilol (COREG) 25 MG tablet Take 25 mg by mouth 2 (two) times daily.     Eplontersen Sodium ([place]UA) 45 MG/0.8ML SOAJ  Inject 45 mg into the skin every 30 (thirty) days. 0.8 mL 12   FARXIGA 10 MG TABS tablet Take 10 mg by mouth daily.     furosemide (LASIX) 40 MG tablet Take 1 tablet (40 mg total) by mouth daily. 30 tablet 6   hydrALAZINE (APRESOLINE) 100 MG tablet Take 1 tablet (100 mg total) by mouth 3 (three) times daily. 90 tablet 3   isosorbide mononitrate (IMDUR) 30 MG 24 hr tablet Take 1 tablet (30 mg total) by mouth daily. 90 tablet 3   losartan (COZAAR) 100 MG tablet Take 100 mg by mouth daily.     Tafamidis (VYNDAMAX) 61 MG CAPS Take 61 mg (1 tablet) by mouth daily. 30 capsule 11   No current facility-administered medications for this encounter.    No Known Allergies    Social History   Socioeconomic History   Marital status: Divorced    Spouse name: Not on file   Number of children: 1   Years of education: Not on file   Highest education level: Not on file  Occupational History   Occupation: retired    Associate Professor: Audiological scientist [employer]  Tobacco Use   Smoking status: Never   Smokeless tobacco: Never  Vaping Use   Vaping Use: Never used  Substance and Sexual Activity  Alcohol use: Not Currently    Alcohol/week: 3.0 standard drinks of alcohol    Types: 3 Glasses of wine per week    Comment: occasional    Drug use: No   Sexual activity: Yes  Other Topics Concern   Not on file  Social History Narrative   Left Handed   Lives in a one story home   Drinks Caffeine - once a day    Social Determinants of Health   Financial Resource Strain: Not on file  Food Insecurity: Not on file  Transportation Needs: Not on file  Physical Activity: Not on file  Stress: Not on file  Social Connections: Not on file  Intimate Partner Violence: Not on file      Family History  Problem Relation Age of Onset   Brain cancer Mother        brain tumor   Colon cancer Mother    Stroke Father    Rectal cancer Neg Hx    Stomach cancer Neg Hx    Esophageal cancer Neg Hx     Vitals:   04/11/23  1142  BP: 102/60  Pulse: 82  SpO2: 100%  Weight: 88.9 kg (196 lb)    PHYSICAL EXAM: General:  Elderly male No resp difficulty HEENT: normal Neck: supple. no JVD. Carotids 2+ bilat; no bruits. No lymphadenopathy or thryomegaly appreciated. Cor: PMI nondisplaced. Irregular rate & rhythm. No rubs, gallops or murmurs. Lungs: clear Abdomen: soft, nontender, nondistended. No hepatosplenomegaly. No bruits or masses. Good bowel sounds. Extremities: no cyanosis, clubbing, rash, 1+ edema Neuro: alert & orientedx3, cranial nerves grossly intact. moves all 4 extremities w/o difficulty. Affect pleasant   ECG: AF narrow complex Personally reviewed   ASSESSMENT & PLAN:  1. Chronic HF with mildly reduced ejection fraction in setting of cardiac amyloidosis - echo 1/24 EF 45-50% RV normal. Mild to moderate AI - Stable NYHA III - Volume status much improved ReDS 49% -> 36% though still with some LE edema. Encouraged use of compression stockings - BP low - Continue lasix 40mg  daily - Continue carvedilol 25 bid - Continue Farxiga 10  - Continue losartan 100  - Decrease hydralazine to 50 tid - Continue Imdur 30  - If symptoms persist after diuresis will need R/L cath - Check labs - Treatment of TTR as below  2. 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 + VAL142Ile - On tafamadis - Continue eplontersen [name] - started 3/31 - Refer to Genetics counselor  3. Permanent AF - Failed DC-CV while on amiodarone  - rate controlled - Continue Eliquis - No change  4. CKD 3B - baseline Scr ~1.5-1.8 - continue ARB/SGLT2i - labs today  5. HTN, uncontrolled - BP very high at last visit but low today.  - Decreased hydralazine to 50 tid - Has f/u in HTN Clinic - Goal SBP from my standpoint 110-140 given age and orthostasis risk associated with TTR  Arvilla Meres, MD  12:13 PM

## 2023-04-11 NOTE — Progress Notes (Signed)
ReDS Vest / Clip - 04/11/23 1100       ReDS Vest / Clip   Station Marker C    Ruler Value 30    ReDS Value Range Moderate volume overload    ReDS Actual Value 36

## 2023-04-11 NOTE — Patient Instructions (Signed)
DECREASE Hydralazine to 50 mg ( 1/2  tablet) 3 x a day  Labs done today, your results will be available in MyChart, we will contact you for abnormal readings.   Your physician recommends that you schedule a follow-up appointment in: 4 months(August) Call in May to schedule an appointment   If you have any questions or concerns before your next appointment please send [place] a message through [place] or call our office at [phone].    TO LEAVE A MESSAGE FOR THE NURSE SELECT OPTION 2, PLEASE LEAVE A MESSAGE INCLUDING: YOUR NAME DATE OF BIRTH CALL BACK NUMBER REASON FOR CALL**this is important as we prioritize the call backs  YOU WILL RECEIVE A CALL BACK THE SAME DAY AS LONG AS YOU CALL BEFORE 4:00 PM  At the Advanced [facility] Clinic, you and your health needs are our priority. As part of our continuing mission to provide you with exceptional heart care, we have created designated Provider Care Teams. These Care Teams include your primary Cardiologist (physician) and Advanced Practice Providers (APPs- Physician Assistants and Nurse Practitioners) who all work together to provide you with the care you need, when you need it.   You may see any of the following providers on your designated Care Team at your next follow up: Dr Arvilla Meres Dr Marca Ancona Dr. Marcos Eke, NP Robbie Lis, [place] [facility] [place], [place] Brynda Peon, NP Karle Plumber, PharmD   Please be sure to bring in all your medications bottles to every appointment.    Thank you for choosing [facility]

## 2023-04-19 ENCOUNTER — Emergency Department (HOSPITAL_COMMUNITY): Payer: Medicare Other

## 2023-04-19 ENCOUNTER — Emergency Department (HOSPITAL_COMMUNITY)
Admission: EM | Admit: 2023-04-19 | Discharge: 2023-04-20 | Disposition: A | Discharge status: Home / Self Care | Payer: Medicare Other | Attending: Emergency Medicine | Admitting: Emergency Medicine

## 2023-04-19 DIAGNOSIS — R600 Localized edema: Secondary | ICD-10-CM | POA: Insufficient documentation

## 2023-04-19 DIAGNOSIS — Z7901 Long term (current) use of anticoagulants: Secondary | ICD-10-CM | POA: Diagnosis not present

## 2023-04-19 DIAGNOSIS — I5032 Chronic diastolic (congestive) heart failure: Secondary | ICD-10-CM | POA: Diagnosis not present

## 2023-04-19 DIAGNOSIS — M7989 Other specified soft tissue disorders: Secondary | ICD-10-CM | POA: Diagnosis present

## 2023-04-19 LAB — COMPREHENSIVE METABOLIC PANEL
ALT: 26 U/L (ref 0–44)
AST: 38 U/L (ref 15–41)
Albumin: 3 g/dL — ABNORMAL LOW (ref 3.5–5.0)
Alkaline Phosphatase: 108 U/L (ref 38–126)
Anion gap: 10 (ref 5–15)
BUN: 25 mg/dL — ABNORMAL HIGH (ref 8–23)
CO2: 22 mmol/L (ref 22–32)
Calcium: 9.3 mg/dL (ref 8.9–10.3)
Chloride: 106 mmol/L (ref 98–111)
Creatinine, Ser: 1.65 mg/dL — ABNORMAL HIGH (ref 0.61–1.24)
GFR, Estimated: 43 mL/min — ABNORMAL LOW (ref >60)
Glucose, Bld: 91 mg/dL (ref 70–99)
Potassium: 4 mmol/L (ref 3.5–5.1)
Sodium: 138 mmol/L (ref 135–145)
Total Bilirubin: 2.2 mg/dL — ABNORMAL HIGH (ref 0.3–1.2)
Total Protein: 8.3 g/dL — ABNORMAL HIGH (ref 6.5–8.1)

## 2023-04-19 LAB — BRAIN NATRIURETIC PEPTIDE: B Natriuretic Peptide: 2484.5 pg/mL — ABNORMAL HIGH (ref 0.0–100.0)

## 2023-04-19 LAB — CBC WITH DIFFERENTIAL/PLATELET
Abs Immature Granulocytes: 0.03 10*3/uL (ref 0.00–0.07)
Basophils Absolute: 0 10*3/uL (ref 0.0–0.1)
Basophils Relative: 1 %
Eosinophils Absolute: 0 10*3/uL (ref 0.0–0.5)
Eosinophils Relative: 1 %
HCT: 47.8 % (ref 39.0–52.0)
Hemoglobin: 15.4 g/dL (ref 13.0–17.0)
Immature Granulocytes: 1 %
Lymphocytes Relative: 17 %
Lymphs Abs: 1.1 10*3/uL (ref 0.7–4.0)
MCH: 29.6 pg (ref 26.0–34.0)
MCHC: 32.2 g/dL (ref 30.0–36.0)
MCV: 91.7 fL (ref 80.0–100.0)
Monocytes Absolute: 0.9 10*3/uL (ref 0.1–1.0)
Monocytes Relative: 14 %
Neutro Abs: 4.2 10*3/uL (ref 1.7–7.7)
Neutrophils Relative %: 66 %
Platelets: 215 10*3/uL (ref 150–400)
RBC: 5.21 MIL/uL (ref 4.22–5.81)
RDW: 16.8 % — ABNORMAL HIGH (ref 11.5–15.5)
WBC: 6.2 10*3/uL (ref 4.0–10.5)
nRBC: 0 % (ref 0.0–0.2)

## 2023-04-19 LAB — PROTIME-INR
INR: 1.3 — ABNORMAL HIGH (ref 0.8–1.2)
Prothrombin Time: 15.7 seconds — ABNORMAL HIGH (ref 11.4–15.2)

## 2023-04-19 LAB — CK: Total CK: 66 U/L (ref 49–397)

## 2023-04-19 LAB — AMMONIA: Ammonia: 27 umol/L (ref 9–35)

## 2023-04-19 MED ORDER — FUROSEMIDE 10 MG/ML IJ SOLN
20.0000 mg | Freq: Once | INTRAMUSCULAR | Status: AC
  Administered 2023-04-19: 20 mg via INTRAVENOUS
  Filled 2023-04-19: qty 2

## 2023-04-19 MED ORDER — SODIUM CHLORIDE 0.9 % IV BOLUS
500.0000 mL | Freq: Once | INTRAVENOUS | Status: AC
  Administered 2023-04-19: 500 mL via INTRAVENOUS

## 2023-04-19 NOTE — ED Provider Notes (Signed)
[place] EMERGENCY DEPARTMENT AT [facility] Provider Note   CSN: [id number] Arrival date & time: 04/19/23  1636     History Chief Complaint  Patient presents with   Leg Swelling    Jordan [name]. is a 77 y.o. male.  Patient presents emergency department complaints of leg swelling.  Patient's pastor significant for congestive heart failure and permanent atrial fibrillation.  He was seen by his cardiologist a few days ago with no changes in his medications.  He reports that he is taking his Lasix daily as prescribed.  Has reportedly had some leg pain and increased swelling over the last few days.  States that the pain makes it difficult to walk and is primarily in the bottom of his foot.  Denies any recent falls or injuries to the area.  Has not noticed any erythematous skin in the area concerning for infection.  HPI     Home Medications Prior to Admission medications   Medication Sig Start Date End Date Taking? Authorizing Provider  allopurinol (ZYLOPRIM) 100 MG tablet Take 1 tablet (100 mg total) by mouth daily. 05/18/21   Angiulli, Mcarthur Rossetti, PA-C  apixaban (ELIQUIS) 5 MG TABS tablet Take 1 tablet (5 mg total) by mouth 2 (two) times daily. 05/18/21   Angiulli, Mcarthur Rossetti, PA-C  atorvastatin (LIPITOR) 40 MG tablet Take 1 tablet (40 mg total) by mouth daily. 05/18/21   Angiulli, Mcarthur Rossetti, PA-C  Blood Pressure Monitor KIT Use as directed by your health care provider 10/09/21   Chrystie Nose, MD  carvedilol (COREG) 25 MG tablet Take 25 mg by mouth 2 (two) times daily. 12/01/22   [provider]  Eplontersen Sodium (WAINUA) 45 MG/0.8ML SOAJ Inject 45 mg into the skin every 30 (thirty) days. 03/03/23   O'NealRonnald Ramp, MD  FARXIGA 10 MG TABS tablet Take 10 mg by mouth daily. 03/13/22   [provider]  furosemide (LASIX) 40 MG tablet Take 1 tablet (40 mg total) by mouth daily. 02/08/23   Bensimhon, Bevelyn Buckles, MD  hydrALAZINE (APRESOLINE) 100 MG tablet Take  0.5 tablets (50 mg total) by mouth 2 (two) times daily. 04/11/23   Bensimhon, Bevelyn Buckles, MD  isosorbide mononitrate (IMDUR) 30 MG 24 hr tablet Take 1 tablet (30 mg total) by mouth daily. 12/03/22 11/28/23  Sande Rives, MD  losartan (COZAAR) 100 MG tablet Take 100 mg by mouth daily. 05/16/22   [provider]  Tafamidis (VYNDAMAX) 61 MG CAPS Take 61 mg (1 tablet) by mouth daily. 08/05/22   O'Neal, Ronnald Ramp, MD      Allergies    Patient has no known allergies.    Review of Systems   Review of Systems  Cardiovascular:  Positive for leg swelling.  All other systems reviewed and are negative.   Physical Exam Updated Vital Signs BP (!) 166/129   Pulse (!) 42   Temp 98 F (36.7 C)   Resp 15   SpO2 96%  Physical Exam Vitals and nursing note reviewed.  Constitutional:      General: He is not in acute distress.    Appearance: He is well-developed.  HENT:     Head: Normocephalic and atraumatic.  Eyes:     Conjunctiva/sclera: Conjunctivae normal.  Cardiovascular:     Rate and Rhythm: Normal rate and regular rhythm.     Heart sounds: No murmur heard. Pulmonary:     Effort: Pulmonary effort is normal. No respiratory distress.  Breath sounds: Normal breath sounds.  Abdominal:     Palpations: Abdomen is soft.     Tenderness: There is no abdominal tenderness.  Musculoskeletal:        General: Swelling present.     Cervical back: Neck supple.     Right lower leg: Edema present.     Left lower leg: Edema present.     Comments: 2+ pitting edema noted bilaterally in lower extremities  Skin:    General: Skin is warm and dry.     Capillary Refill: Capillary refill takes less than 2 seconds.     Coloration: Skin is not pale.     Findings: No erythema, lesion or rash.  Neurological:     General: No focal deficit present.     Mental Status: He is alert.  Psychiatric:        Mood and Affect: Mood normal.     ED Results / Procedures / Treatments   Labs (all labs  ordered are listed, but only abnormal results are displayed) Labs Reviewed  BRAIN NATRIURETIC PEPTIDE - Abnormal; Notable for the following components:      Result Value   B Natriuretic Peptide 2,484.5 (*)    All other components within normal limits  CBC WITH DIFFERENTIAL/PLATELET - Abnormal; Notable for the following components:   RDW 16.8 (*)    All other components within normal limits  COMPREHENSIVE METABOLIC PANEL - Abnormal; Notable for the following components:   BUN 25 (*)    Creatinine, Ser 1.65 (*)    Total Protein 8.3 (*)    Albumin 3.0 (*)    Total Bilirubin 2.2 (*)    GFR, Estimated 43 (*)    All other components within normal limits  PROTIME-INR - Abnormal; Notable for the following components:   Prothrombin Time 15.7 (*)    INR 1.3 (*)    All other components within normal limits  CK  AMMONIA    EKG None  Radiology DG Foot 2 Views Left  Result Date: 04/19/2023 CLINICAL DATA:  Pain and swelling in right lower extremity, unable to walk and stand. EXAM: LEFT FOOT - 2 VIEW COMPARISON:  11/26/2018. FINDINGS: There is no evidence of fracture or dislocation. There is moderate calcaneal spurring. Degenerative changes are noted in the midfoot and first metatarsophalangeal joint. Vascular calcifications are noted in the soft tissues. There swelling about the foot and ankle. IMPRESSION: No acute osseous abnormality. Electronically Signed   By: Thornell Sartorius M.D.   On: 04/19/2023 22:17   DG Chest 2 View  Result Date: 04/19/2023 CLINICAL DATA:  Leg swelling EXAM: CHEST - 2 VIEW COMPARISON:  None Available. FINDINGS: Cardiomegaly. No pleural effusion. No pneumothorax. Prominent bilateral interstitial opacities could represent mild pulmonary edema or pulmonary venous congestion. No radiographically apparent displaced rib fractures. Visualized upper abdomen is unremarkable. Vertebral body heights are maintained IMPRESSION: 1. Prominent bilateral interstitial opacities could  represent mild pulmonary edema or pulmonary venous congestion. 2. Cardiomegaly. Electronically Signed   By: Lorenza Cambridge M.D.   On: 04/19/2023 17:39    Procedures Procedures   Medications Ordered in ED Medications  furosemide (LASIX) injection 20 mg (20 mg Intravenous Given 04/19/23 2224)  sodium chloride 0.9 % bolus 500 mL (0 mLs Intravenous Stopped 04/19/23 2300)    ED Course/ Medical Decision Making/ A&P                           Medical Decision Making Amount  and/or Complexity of Data Reviewed Labs: ordered. Radiology: ordered.  Risk Prescription drug management.   This patient presents to the ED for concern of leg swelling.  Differential diagnosis includes CHF exacerbation   Lab Tests:  I Ordered, and personally interpreted labs.  The pertinent results include:  CBC unremarkable, CMP at baseline, CK normal, ammonia normal, Protime-INR at baseline but most recent lab was 1 year ago, BNP elevated at 2484.5   Imaging Studies ordered:  I ordered imaging studies including xray of left foot, xray of chest  I independently visualized and interpreted imaging which showed mild pulmonary edema vs pulmonary congestion, cardiomegaly,  I agree with the radiologist interpretation   Medicines ordered and prescription drug management:  I ordered medication including Lasix, fluid  for edema  Reevaluation of the patient after these medicines showed that the patient improved I have reviewed the patients home medicines and have made adjustments as needed   Problem List / ED Course:  Patient presented to the ED for leg swelling. He reports that he has had some worsening in his leg swelling even with home medications and this has been worsening his pain. No recent injury, falls, or trauma to the area. Reports that he has been following up with cardiology and recently had appointment on 04/11/2023 with no changes in home medications made. Given that patient has not been experiencing any  shortness of breath, chest pain, nausea, diaphoresis, I believe that patient's foot pain is due to fluid overload causing lower extremity edema.  After dose of IV lasix, patient was able to increase urine output and had improvement in foot pain. He reports that he has been taking his PO Lasix that as prescribed but unclear given that patient has persistently had elevated BNP on prior labs. Will plan on patient following up with cardiology for further evaluation given need for ED visit. May be beneficial to have medication adjustments but will defer this to cardiology.  Final Clinical Impression(s) / ED Diagnoses Final diagnoses:  Bilateral lower extremity edema  Chronic diastolic (congestive) heart failure    Rx / DC Orders ED Discharge Orders          Ordered    Ambulatory referral to Cardiology       Comments: If you have not heard from the Cardiology office within the next 72 hours please call [phone].   04/19/23 2336              Smitty Knudsen, PA-C 04/19/23 2339    Tegeler, Canary Brim, MD 04/20/23 0003

## 2023-04-19 NOTE — ED Triage Notes (Addendum)
Pt has extremity swelling bilaterally- left worse than R. Is unable to stand to walk. Hx of stroke and med noncompliance. CHF. COming from home where he lives alone. Sister checks on him. Found down yesterday on floor possibly 12 hours but no EMS called at that time. Denies chest pain- SOB upon exertion per baseline since having stroke

## 2023-04-19 NOTE — Discharge Instructions (Addendum)
You were seen in the emergency department for leg swelling. After a dose of Lasix, you had improvement in the pain in your left foot. Please continue to take your Lasix at home as prescribed and follow a low salt diet, wear compression stockings, and elevate legs. Please return to the ED if your symptoms worsen. Otherwise, follow up with cardiology for further evaluation.

## 2023-04-19 NOTE — ED Provider Triage Note (Signed)
Emergency Medicine Provider Triage Evaluation Note  Jordan [name]. , a 77 y.o. male  was evaluated in triage.  Patient had a fall yesterday and was lying on the floor for 12 hours.  Sister went and checked on him and found him on the floor and helped him up.  Subsequently he has had leg weakness and unable to stand and walk.  Does have a history of CHF with fluid overload.  Endorsing some shortness of breath on exertion however says this is chronic.  Patient admits to nonadherence to medication regimen  Review of Systems  Positive:  Negative:   Physical Exam  There were no vitals taken for this visit. Gen:   Awake, no distress   Resp:  Normal effort  MSK:   Moves extremities without difficulty  Other:  Pitting edema bilateral lower extremities.  Speaking in complete sentences, no accessory muscle use.  Overall well-appearing  Medical Decision Making  Medically screening exam initiated at 4:48 PM.  Appropriate orders placed.  [name] was informed that the remainder of the evaluation will be completed by another provider, this initial triage assessment does not replace that evaluation, and the importance of remaining in the ED until their evaluation is complete.     Saddie Benders, [place] 04/19/23 1649

## 2023-04-21 ENCOUNTER — Other Ambulatory Visit (HOSPITAL_COMMUNITY): Payer: Self-pay

## 2023-04-25 ENCOUNTER — Telehealth (HOSPITAL_COMMUNITY): Payer: Self-pay | Admitting: Vascular Surgery

## 2023-04-25 NOTE — Telephone Encounter (Signed)
Calle dpt to make ER fu w/ app/np, no answer/no VM WILL TRY BACK

## 2023-04-26 ENCOUNTER — Other Ambulatory Visit: Payer: Self-pay

## 2023-04-26 DIAGNOSIS — Z515 Encounter for palliative care: Secondary | ICD-10-CM

## 2023-04-28 ENCOUNTER — Institutional Professional Consult (permissible substitution) (HOSPITAL_BASED_OUTPATIENT_CLINIC_OR_DEPARTMENT_OTHER): Payer: Medicare Other | Admitting: Family

## 2023-04-29 ENCOUNTER — Telehealth (HOSPITAL_COMMUNITY): Payer: Self-pay

## 2023-04-29 ENCOUNTER — Other Ambulatory Visit (HOSPITAL_COMMUNITY): Payer: Self-pay

## 2023-04-29 DIAGNOSIS — I5022 Chronic systolic (congestive) heart failure: Secondary | ICD-10-CM

## 2023-04-29 MED ORDER — POTASSIUM CHLORIDE CRYS ER 20 MEQ PO TBCR: 20.0000 meq | EXTENDED_RELEASE_TABLET | Freq: Two times a day (BID) | ORAL | 6 refills | Status: DC

## 2023-04-29 NOTE — Progress Notes (Signed)
COMMUNITY PALLIATIVE CARE SW NOTE  PATIENT NAME: Jordan [name]. DOB: 1946/06/05 MRN: [id number]  PRIMARY CARE PROVIDER: Lorenda Ishihara, MD  RESPONSIBLE PARTY:  Acct ID - Guarantor Home Phone Work Phone Relationship Acct Type  [id number] - Carriero,GE* [phone]  Self P/F     [address], [name], [place] [address]   Initial Palliative Care Encounter/Clinical Social Work  [facility] W completed a telephonic encounter with patient's sister-Barbara. Jordan [name] was provided education regarding the palliative care program. She verbalized understanding of the services and provided an initial consent to services.   Jordan [name] reported that patient is doing okay overall. His mobility is not good. He has a lot of edema in his legs, which has made him immobile. They have had to rent a wheelchair. Patient had physical therapy briefly, but it has not been recommended again. He can get around his apartment, which he lives alone. He is still able to take a few steps, which allows him to maintain some independence, such as completing his own personal care and going to the bathroom. He has had two falls in the last week. Jordan [name] state that she and her husband take food to him daily.  SW provided education regarding mobile meals and the sister requested a referral be made. She is also interested in getting in home care for patient.   Next appointment is scheduled for: 05/10/2023  Social History   Tobacco Use   Smoking status: Never   Smokeless tobacco: Never  Substance Use Topics   Alcohol use: Not Currently    Alcohol/week: 3.0 standard drinks of alcohol    Types: 3 Glasses of wine per week    Comment: occasional     CODE STATUS: Full Code ADVANCED DIRECTIVES: No MOST FORM COMPLETE: Yes HOSPICE EDUCATION PROVIDED: Yes  Duration of encounter and Documentation: 30 minutes  [employer], LCSW

## 2023-04-29 NOTE — Telephone Encounter (Signed)
I spoke to daughter and updated on medication changes. She will make sure he is wearing his compression hose as well. Labs ordered and scheduled and Potassium sent into pharmacy.

## 2023-04-29 NOTE — Telephone Encounter (Signed)
Sister called and stated he is having some swelling in legs. He denies any shortness of breath or significant weight gain. He takes his Lasix daily. They just wanted to make sure what he needed to do next. She has him elevating his legs because they are pretty swollen from feet up to knees. Please advise.

## 2023-05-02 ENCOUNTER — Encounter (HOSPITAL_BASED_OUTPATIENT_CLINIC_OR_DEPARTMENT_OTHER): Payer: Self-pay | Admitting: Cardiovascular Disease

## 2023-05-04 NOTE — Progress Notes (Unsigned)
Cardiology Office Note:   Date:  05/05/2023  NAME:  Jordan [name].    MRN: [id number] DOB:  1946/06/11   PCP:  Lorenda Ishihara, MD  Cardiologist:  None  Electrophysiologist:  None   Referring MD: Lorenda Ishihara,*   Chief Complaint  Patient presents with   Follow-up        History of Present Illness:   Jordan [name]. is a 77 y.o. male with a hx of systolic HF, hereditary ATTR cardiac amyloidosis, HTN, CKD 3b, persistent Afib who presents for follow-up.  His weight is down 5 pounds since his last visit.  Blood pressure is much improved.  Denies any chest pain.  No lower extremity edema.  Presents with his sister.  Balance is an issue.  Reports he is using a wheelchair when out.  Not that active.  We discussed physical therapy as well as home health nursing if needed.  He requests referral.  Overall volume status is improved.  I do wonder if eplontersen is helping.   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 Persistent Afib  -DCCV 07/07/2022 -> ERAF 07/21/2022 4. CKD 3b  Past Medical History: Past Medical History:  Diagnosis Date   Atrial fib/flutter, transient (HCC)    Hx   Atrial fibrillation (HCC)    coumadin   Colon cancer (HCC)    family hx   Diverticulosis    Gout    Hemorrhoids    Hepatitis C    History of DVT (deep vein thrombosis)    History of nuclear stress test 04/2002   exercise; normal study    Hypertension    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HCC)    history of    Personal history of colonic polyps    Prostate cancer [facility])    radical prostatectomy     Past Surgical History: Past Surgical History:  Procedure Laterality Date   CARDIAC CATHETERIZATION  2007   no occlusive CAD   CARDIOVERSION N/A 10/07/2021   Procedure: CARDIOVERSION;  Surgeon: Chrystie Nose, MD;  Location: [facility] ENDOSCOPY;  Service: Cardiovascular;  Laterality: N/A;   CARDIOVERSION  N/A 07/07/2022   Procedure: CARDIOVERSION;  Surgeon: Meriam Sprague, MD;  Location: [facility] ENDOSCOPY;  Service: Cardiovascular;  Laterality: N/A;   COLONOSCOPY  2002,2007   Lavina Hamman   PROSTATECTOMY     PROSTATECTOMY  04-04   Dr. Isabel Caprice   TRANSTHORACIC ECHOCARDIOGRAM  05/2006   EF ~60%; LV systolic function normal; mild focal basal septal hypertrophy; AV thickness mildly increased; LA mod-markedly dilate    Current Medications: Current Meds  Medication Sig   allopurinol (ZYLOPRIM) 100 MG tablet Take 1 tablet (100 mg total) by mouth daily.   apixaban (ELIQUIS) 5 MG TABS tablet Take 1 tablet (5 mg total) by mouth 2 (two) times daily.   atorvastatin (LIPITOR) 40 MG tablet Take 1 tablet (40 mg total) by mouth daily.   Blood Pressure Monitor KIT Use as directed by your health care provider   carvedilol (COREG) 25 MG tablet Take 25 mg by mouth 2 (two) times daily.   Eplontersen Sodium (WAINUA) 45 MG/0.8ML SOAJ Inject 45 mg into the skin every 30 (thirty) days.   FARXIGA 10 MG TABS tablet Take 10 mg by mouth daily.   furosemide (LASIX) 40 MG tablet Take 1 tablet (40 mg total) by mouth daily.   hydrALAZINE (APRESOLINE) 100 MG tablet Take 0.5 tablets (50 mg total) by mouth 2 (  two) times daily.   isosorbide mononitrate (IMDUR) 30 MG 24 hr tablet Take 1 tablet (30 mg total) by mouth daily.   losartan (COZAAR) 100 MG tablet Take 100 mg by mouth daily.   potassium chloride SA (KLOR-CON M) 20 MEQ tablet Take 1 tablet (20 mEq total) by mouth 2 (two) times daily.   Tafamidis (VYNDAMAX) 61 MG CAPS Take 61 mg (1 tablet) by mouth daily.     Allergies:    Patient has no known allergies.   Social History: Social History   Socioeconomic History   Marital status: Divorced    Spouse name: Not on file   Number of children: 1   Years of education: Not on file   Highest education level: Not on file  Occupational History   Occupation: retired    Associate Professor: Audiological scientist [employer]  Tobacco Use    Smoking status: Never   Smokeless tobacco: Never  Vaping Use   Vaping Use: Never used  Substance and Sexual Activity   Alcohol use: Not Currently    Alcohol/week: 3.0 standard drinks of alcohol    Types: 3 Glasses of wine per week    Comment: occasional    Drug use: No   Sexual activity: Yes  Other Topics Concern   Not on file  Social History Narrative   Left Handed   Lives in a one story home   Drinks Caffeine - once a day    Social Determinants of Health   Financial Resource Strain: Not on file  Food Insecurity: Not on file  Transportation Needs: Not on file  Physical Activity: Not on file  Stress: Not on file  Social Connections: Not on file     Family History: The patient's family history includes Brain cancer in his mother; Colon cancer in his mother; Stroke in his father. There is no history of Rectal cancer, Stomach cancer, or Esophageal cancer.  ROS:   All other ROS reviewed and negative. Pertinent positives noted in the HPI.     EKGs/Labs/Other Studies Reviewed:   The following studies were personally reviewed by me today:  Recent Labs: 04/19/2023: ALT 26; B Natriuretic Peptide 2,484.5; BUN 25; Creatinine, Ser 1.65; Hemoglobin 15.4; Platelets 215; Potassium 4.0; Sodium 138   Recent Lipid Panel    Component Value Date/Time   CHOL 150 04/28/2021 0446   CHOL 157 05/03/2018 1017   TRIG 51 04/28/2021 0446   HDL 37 (L) 04/28/2021 0446   HDL 55 05/03/2018 1017   CHOLHDL 4.1 04/28/2021 0446   VLDL 10 04/28/2021 0446   LDLCALC 103 (H) 04/28/2021 0446   LDLCALC 88 05/03/2018 1017    Physical Exam:   VS:  BP (!) 132/96 (BP Location: Left Arm, Patient Position: Sitting, Cuff Size: Normal)   Pulse (!) 53   Ht 6' (1.829 m)   Wt 190 lb 12.8 oz (86.5 kg)   SpO2 97%   BMI 25.88 kg/m    Wt Readings from Last 3 Encounters:  05/05/23 190 lb 12.8 oz (86.5 kg)  04/11/23 196 lb (88.9 kg)  02/08/23 191 lb 3.2 oz (86.7 kg)    General: Well nourished, well developed,  in no acute distress Head: Atraumatic, normal size  Eyes: PEERLA, EOMI  Neck: Supple, no JVD Endocrine: No thryomegaly Cardiac: Normal S1, S2; irregular rhythm, no murmurs Lungs: Clear to auscultation bilaterally, no wheezing, rhonchi or rales  Abd: Soft, nontender, no hepatomegaly  Ext: No edema, pulses 2+ Musculoskeletal: No deformities, BUE and BLE strength normal  and equal Skin: Warm and dry, no rashes   Neuro: Alert and oriented to person, place, time, and situation, CNII-XII grossly intact, no focal deficits  Psych: Normal mood and affect   ASSESSMENT:   Moath Lum. is a 77 y.o. male who presents for the following: 1. Chronic systolic heart failure (HCC)   2. Hereditary cardiac amyloidosis (HCC)   3. Persistent atrial fibrillation (HCC)   4. Imbalance     PLAN:   1. Chronic systolic heart failure (HCC) 2. Hereditary cardiac amyloidosis (HCC) -Hereditary cardiac amyloidosis.  Ejection fraction 45%.  Volume status much improved.  Currently on Lasix 40 mg daily.  Blood pressure regimen includes carvedilol 25 mg twice daily, Farxiga 10 mg daily, Lasix 40 mg daily, hydralazine 50 mg twice daily, isosorbide mononitrate 30 mg daily, losartan 100 mg daily.  He will see advanced heart failure next week.  Suspect he would benefit from transition to [facility].  He is also on potassium daily.  He is on tafamidis.  He has been genetically screened.  His sister was genetically negative.  He does have a son who needs testing.  We did discuss that today.  3. Persistent atrial fibrillation (HCC) -Rate controlled on carvedilol.  On Eliquis 5 mg twice daily.  He has failed cardioversion with amiodarone.  I believe the best we can offer him is rate control.  4. 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  We will continue Eplontersen.  Hopefully this medication will improve some of his symptoms.  We will send a referral for home health physical  therapy as well as home health nursing.      Disposition: Return in about 3 months (around 08/05/2023).  Medication Adjustments/Labs and Tests Ordered: Current medicines are reviewed at length with the patient today.  Concerns regarding medicines are outlined above.  Orders Placed This Encounter  Procedures   Ambulatory referral to Home Health   No orders of the defined types were placed in this encounter.   Patient Instructions  Medication Instructions:  The current medical regimen is effective;  continue present plan and medications.  *If you need a refill on your cardiac medications before your next appointment, please call your pharmacy*  Follow-Up: At [facility], you and your health needs are our priority.  As part of our continuing mission to provide you with exceptional heart care, we have created designated Provider Care Teams.  These Care Teams include your primary Cardiologist (physician) and Advanced Practice Providers (APPs -  Physician Assistants and Nurse Practitioners) who all work together to provide you with the care you need, when you need it.  We recommend signing up for the patient portal called "MyChart".  Sign up information is provided on this After Visit Summary.  MyChart is used to connect with patients for Virtual Visits (Telemedicine).  Patients are able to view lab/test results, encounter notes, upcoming appointments, etc.  Non-urgent messages can be sent to your provider as well.   To learn more about what you can do with MyChart, go to [email or web address].    Your next appointment:   3 month(s)  Provider:   Lennie Odor, MD   Other Instructions Home health PT order    Time Spent with Patient: I have spent a total of 35 minutes with patient reviewing hospital notes, telemetry, EKGs, labs and examining the patient as well as establishing an assessment and plan that was discussed with the patient.  > 50% of time was  spent in direct  patient care.  Signed, Lenna Gilford. Flora Lipps, MD, [facility]  [facility]  [address] [place], [place] [address] [phone]  05/05/2023 7:56 PM

## 2023-05-05 ENCOUNTER — Encounter: Payer: Self-pay | Admitting: Cardiovascular Disease

## 2023-05-05 ENCOUNTER — Ambulatory Visit: Payer: Medicare Other | Attending: Cardiovascular Disease | Admitting: Cardiovascular Disease

## 2023-05-05 VITALS — BP 132/96 | HR 53 | Ht 72.0 in | Wt 190.8 lb

## 2023-05-05 DIAGNOSIS — E854 Organ-limited amyloidosis: Secondary | ICD-10-CM

## 2023-05-05 DIAGNOSIS — I4819 Other persistent atrial fibrillation: Secondary | ICD-10-CM

## 2023-05-05 DIAGNOSIS — R2689 Other abnormalities of gait and mobility: Secondary | ICD-10-CM | POA: Diagnosis not present

## 2023-05-05 DIAGNOSIS — I43 Cardiomyopathy in diseases classified elsewhere: Secondary | ICD-10-CM

## 2023-05-05 DIAGNOSIS — I5022 Chronic systolic (congestive) heart failure: Secondary | ICD-10-CM | POA: Diagnosis not present

## 2023-05-05 NOTE — Patient Instructions (Signed)
Medication Instructions:  The current medical regimen is effective;  continue present plan and medications.  *If you need a refill on your cardiac medications before your next appointment, please call your pharmacy*  Follow-Up: At [facility], you and your health needs are our priority.  As part of our continuing mission to provide you with exceptional heart care, we have created designated Provider Care Teams.  These Care Teams include your primary Cardiologist (physician) and Advanced Practice Providers (APPs -  Physician Assistants and Nurse Practitioners) who all work together to provide you with the care you need, when you need it.  We recommend signing up for the patient portal called "MyChart".  Sign up information is provided on this After Visit Summary.  MyChart is used to connect with patients for Virtual Visits (Telemedicine).  Patients are able to view lab/test results, encounter notes, upcoming appointments, etc.  Non-urgent messages can be sent to your provider as well.   To learn more about what you can do with MyChart, go to [email or web address].    Your next appointment:   3 month(s)  Provider:   Lennie Odor, MD   Other Instructions Home health PT order

## 2023-05-09 ENCOUNTER — Ambulatory Visit (HOSPITAL_COMMUNITY)
Admission: RE | Admit: 2023-05-09 | Discharge: 2023-05-09 | Disposition: A | Discharge status: Home / Self Care | Payer: Medicare Other | Source: Ambulatory Visit | Attending: Cardiology | Admitting: Cardiology

## 2023-05-09 DIAGNOSIS — I5022 Chronic systolic (congestive) heart failure: Secondary | ICD-10-CM | POA: Insufficient documentation

## 2023-05-09 LAB — BASIC METABOLIC PANEL
Anion gap: 8 (ref 5–15)
BUN: 23 mg/dL (ref 8–23)
CO2: 21 mmol/L — ABNORMAL LOW (ref 22–32)
Calcium: 8.8 mg/dL — ABNORMAL LOW (ref 8.9–10.3)
Chloride: 110 mmol/L (ref 98–111)
Creatinine, Ser: 1.78 mg/dL — ABNORMAL HIGH (ref 0.61–1.24)
GFR, Estimated: 39 mL/min — ABNORMAL LOW (ref >60)
Glucose, Bld: 118 mg/dL — ABNORMAL HIGH (ref 70–99)
Potassium: 4.8 mmol/L (ref 3.5–5.1)
Sodium: 139 mmol/L (ref 135–145)

## 2023-05-09 LAB — BRAIN NATRIURETIC PEPTIDE: B Natriuretic Peptide: 3054.8 pg/mL — ABNORMAL HIGH (ref 0.0–100.0)

## 2023-05-10 ENCOUNTER — Other Ambulatory Visit: Payer: Self-pay

## 2023-05-10 VITALS — BP 122/60 | HR 51 | Temp 98.1°F

## 2023-05-10 DIAGNOSIS — Z515 Encounter for palliative care: Secondary | ICD-10-CM

## 2023-05-10 NOTE — Progress Notes (Signed)
PATIENT NAME: Jordan [name]. DOB: 11/07/1946 MRN: [id number]  PRIMARY CARE PROVIDER: Lorenda Ishihara, MD  RESPONSIBLE PARTY:  Acct ID - Guarantor Home Phone Work Phone Relationship Acct Type  [id number] - [name],GE* [phone]  Self P/F     [address], [name], [place] [address]   Palliative Care New Encounter Note   Completed home visit. Sister Jordan [name] and Jordan [name] also present.     HISTORY OF PRESENT ILLNESS:    TODAY'S VISIT:  Respiratory: SOB at times; gets SOB with exertion  Cardiac: CHF; no edema noted; recent increase in Lasix that is effective  Cognitive: alert and oriented; no confusion   Appetite: 2 meals and seldom has snacks; 46oz daily  GI/GU: 1 BM daily: no urinary issues; wears adult briefs   Mobility: should use his walker but does not; will use the wheelchair when going  ADLs: independent with grooming and dressing; needs assist with all other ADLs   Sleeping Pattern: sleeps approx 6 hrs nightly  Pain: denies pain at this time    Palliative Care/ Hospice: LPN explained role and purpose of palliative care including visit frequency. Also discussed benefits of hospice care as well as the differences between the two with patient.   Goals of Care: To stay in the home     Next Appt Scheduled For: 06/07/23 @ 11:30am    HISTORY OF PRESENT ILLNESS:    CODE STATUS: Full Code ADVANCED DIRECTIVES: N MOST FORM: Y PPS: 50%   PHYSICAL EXAM:   VITALS: Today's Vitals   05/10/23 1022  BP: 122/60  Pulse: (!) 51  Temp: 98.1 F (36.7 C)  TempSrc: Temporal  SpO2: 97%  PainSc: 0-No pain    LUNGS: clear to auscultation , decreased breath sounds CARDIAC: Cor irreg, irreg RRR EXTREMITIES: negative SKIN: Skin color, texture, turgor normal. No rashes or lesions  NEURO: negative except for weakness       Jordan Hausmann Clementeen Graham, LPN

## 2023-05-13 ENCOUNTER — Encounter (HOSPITAL_COMMUNITY): Payer: Self-pay | Admitting: Cardiology

## 2023-05-17 ENCOUNTER — Telehealth: Payer: Self-pay | Admitting: Cardiovascular Disease

## 2023-05-17 NOTE — Telephone Encounter (Signed)
Called patient sister, advised I would print referral and fax over to them.   Patient sister verbalized understanding, thankful for call back

## 2023-05-17 NOTE — Telephone Encounter (Signed)
Britta Mccreedy called wanting to speak to Jordan [name], she said [name] says [facility] don't have a referral.

## 2023-05-17 NOTE — Telephone Encounter (Signed)
Patient's sister is calling in regards to a referral that was made for the patient. Please advise.

## 2023-05-17 NOTE — Telephone Encounter (Signed)
Called patient sister, advised that referral was pending, showing approval, I did provide number to their office to see if anything was needed and to let me know I could send over anything needed.   Patient sister verbalized understanding.

## 2023-05-25 ENCOUNTER — Telehealth: Payer: Self-pay | Admitting: Cardiovascular Disease

## 2023-05-25 NOTE — Telephone Encounter (Signed)
Returned call to [facility] referral but missing HH order and demographics.  Fax# [phone]   Faxed order and demographics to [facility]

## 2023-05-25 NOTE — Telephone Encounter (Signed)
Caller stated they will need completed orders as well as patient's demographics sent for patient's home health service.

## 2023-05-28 ENCOUNTER — Other Ambulatory Visit (HOSPITAL_COMMUNITY): Payer: Self-pay | Admitting: Internal Medicine

## 2023-05-30 ENCOUNTER — Telehealth: Payer: Self-pay | Admitting: Cardiovascular Disease

## 2023-05-30 NOTE — Telephone Encounter (Signed)
Left voicemail to return call to office.

## 2023-05-30 NOTE — Telephone Encounter (Signed)
Attempted to call patient about the change in meds. No answer and no voicemail

## 2023-05-30 NOTE — Telephone Encounter (Signed)
Spoke with Jordan [name] PT with [name].   Jordan [name] was not able to get a manual BP. With home automatic cuff from patient's house - he has an upper arm cuff, only 77 year old 191/145 -- prior to meds  Feels same as usual, per report A little breathless Hard to get O2 sats Manual pulse check was in 50s  He is not currently checking his weights - needs batteries for scale  Would like to know if can change alert to when they call us to HR less than 50 -- currently it is 60bpm. Per chart review, last viist HR was 53  Request for visits: Twice weekly for 4 weeks Once weekly for 4 weeks  Advised would send a message to Dr. Flora Lipps about HR parameters and visits requested

## 2023-05-30 NOTE — Telephone Encounter (Signed)
Jordan [name], from [facility] called for verbal orders. Nursing 1 week 5, and then also add occupational therapy.   Patient said her had a stroke in 2022, she needs to know if that is correct.

## 2023-05-30 NOTE — Telephone Encounter (Signed)
Pt c/o BP issue: STAT if pt c/o blurred vision, one-sided weakness or slurred speech  1. What are your last 5 BP readings?   2. Are you having any other symptoms (ex. Dizziness, headache, blurred vision, passed out)?   3. What is your BP issue?   Jordan [name] with [name] states he is unable to get a BP reading manually or automatically. He also mentions that HR is in the low 50's.

## 2023-05-30 NOTE — Telephone Encounter (Signed)
Sande Rives, MD  Lindell Spar, RN Cc: Darene Lamer, LPN Caller: Unspecified (Today, 11:13 AM) Let's reduce his coreg to 12.5 mg BID. Unless he is dizzy or having passing out spells, I would not worry about the heart rate. Symptoms will tell us if his HR is a problem.  Gerri Spore T. Flora Lipps, MD, [facility]  [facility] [address], [address] [place], [place] [address] [phone] 1:05 PM

## 2023-05-31 ENCOUNTER — Telehealth: Payer: Self-pay | Admitting: Cardiovascular Disease

## 2023-05-31 NOTE — Telephone Encounter (Signed)
Called patient, advised of medication update.  Patient verbalized understanding.   Verbal orders sent over this morning.  Patient aware.

## 2023-05-31 NOTE — Telephone Encounter (Signed)
Home Health nurses returning nurses call from yesterday regarding pt. Please advise

## 2023-05-31 NOTE — Telephone Encounter (Signed)
HH would like a Child psychotherapist for evaluation.  States having difficulty with keeping up cleaning and cooking. He is bathing but afraid to shower by himself. He does have OT but feels he will need ongoing help after they are done.  She states PT and OT at present will be seeing him for 4 weeks. She would like to see if SS can evaluate for the future needs.

## 2023-05-31 NOTE — Telephone Encounter (Signed)
Pt c/o BP issue: STAT if pt c/o blurred vision, one-sided weakness or slurred speech  1. What are your last 5 BP readings?  BP using manual sphygmomanometer right arm 138/90  Not able to hear anything on pt's left arm Then they used pt's BP machine right arm [id number]  2. Are you having any other symptoms (ex. Dizziness, headache, blurred vision, passed out)?   3. What is your BP issue?    Denise RN - [name] calling to report BP. Also, pt would like to request if Dr. Flora Lipps can pur an order for social work for community referral

## 2023-05-31 NOTE — Telephone Encounter (Signed)
Verbal orders given to [facility] per Dr. Scharlene Gloss

## 2023-06-02 ENCOUNTER — Telehealth: Payer: Self-pay | Admitting: Cardiovascular Disease

## 2023-06-02 NOTE — Telephone Encounter (Signed)
Called [facility] to get call back number for Jordan [name], He is OT and number [phone].  Call and LM to please call back to clarify as he is with OT not PT and verify orders needed

## 2023-06-02 NOTE — Telephone Encounter (Signed)
Rhunette Croft, PT with [name] called in asking for verbal orders for occupational therapy for 1x a week for 2 weeks and 2x a week for 1 week. He stated it is for functional mobility, heart failure education, pain control, health promotion, home safety, and exercise. He also stated these were different than the other orders called in previously this week. Please advise.

## 2023-06-02 NOTE — Telephone Encounter (Signed)
Jordan [name] is returning call requesting OT once/week 2 weeks, twice/week 1 week, function mobility, heart failure education, pain control, health promotion, safety and exercise.

## 2023-06-03 ENCOUNTER — Other Ambulatory Visit: Payer: Self-pay

## 2023-06-03 DIAGNOSIS — R2689 Other abnormalities of gait and mobility: Secondary | ICD-10-CM

## 2023-06-03 NOTE — Telephone Encounter (Signed)
Call sent back to me in regards to patient, Jordan [name] with PT- called to notify us that patient had diarrhea this morning x3, he states he is having swelling in his legs, and unable to get a manual BP cuff or HR on patient- the automatic cuff gave him a reading on 123/103, and HR in the 40's. Jordan [name] also wanted to verify carvedilol dosage- advised per last message from MD to decrease to 12.5 mg twice daily.   Jordan [name] would like parameters on when to call for the BP and HR, advised I would route to MD to review. What BP and HR would you like for them to call for.   Patient denies symptoms at this time, no new symptoms to report.

## 2023-06-03 NOTE — Telephone Encounter (Signed)
Rosanne Ashing is calling with updates for this patient. Transferred to [place], LPN.

## 2023-06-03 NOTE — Telephone Encounter (Signed)
Referral to social work faxed over to [facility], along with recent office note.   Thanks!

## 2023-06-03 NOTE — Telephone Encounter (Signed)
Gerrianne Scale with OT- left message on secure voicemail with verbal orders for OT requested.   Advised to call back if needed, left call back number.  Thanks!

## 2023-06-06 ENCOUNTER — Telehealth: Payer: Self-pay | Admitting: Cardiovascular Disease

## 2023-06-06 NOTE — Telephone Encounter (Signed)
See previous message, Rosanne Ashing contacted with a response.

## 2023-06-06 NOTE — Telephone Encounter (Signed)
Left message for Rosanne Ashing, letting him know that I will forward this message over to Dr. Flora Lipps to get some blood pressure parameters.

## 2023-06-06 NOTE — Telephone Encounter (Signed)
Jordan [name], who is a physical therapist, called to give BP readings.   At Rest:   98/92 159/148 161/122 122/110  Active:  128/98  He wanted to know what are the parameters for the top and bottom numbers to be alerted. Please call back [phone]

## 2023-06-06 NOTE — Telephone Encounter (Signed)
SBP <80 or SBP >180.   Gerri Spore T. Flora Lipps, MD, [facility]  [facility]  [address], [address]  [place], [place] [address]  [phone]  12:53 PM   Left message on voicemail regarding parameters for blood pressure. Left phone number to call back with questions or concerns.

## 2023-06-07 ENCOUNTER — Other Ambulatory Visit: Payer: Self-pay

## 2023-06-07 DIAGNOSIS — Z515 Encounter for palliative care: Secondary | ICD-10-CM

## 2023-06-07 NOTE — Progress Notes (Unsigned)
COMMUNITY PALLIATIVE CARE SW NOTE  PATIENT NAME: Jordan [name]. DOB: 05/05/1946 MRN: [id number]  PRIMARY CARE PROVIDER: Lorenda Ishihara, MD  RESPONSIBLE PARTY:  Acct ID - Guarantor Home Phone Work Phone Relationship Acct Type  [id number] - [place],GE* [phone]  Self P/F     [address], [name], [place] [address]   Palliative Care Encounter/Clinical Social Work  [employer] SW completed a telephonic visit with patient and his sister-Barbara. They both provided a status update. Patient report that things appear to be getting better. He report that he is moving around better. He continues to receive PT and he is following the exercised recommended as homework. He report that he is not having any pain. He does get easily fatigue with activities, but he will stop and rest. He states that he wished his overall stamina was better. He stated that his blood pressure has been inconsistent. He is down 3 lbs since yesterday and worry that his may be edema related to his Afib.  Patient report being independent, health and fit for so long to now being sick, he has some anxiety around this. SW normalized those feelings, while reassuring him of support. SW also provided education regarding counseling, which he declined.  SW discussed advance directives. He and his sister are interested in HCPOA paperwork-SW to forward paperwork via mail. SW to email her information on life alert button for patient.  He will continue with PT/OT through [facility], 1x week each.    Social History   Tobacco Use   Smoking status: Never   Smokeless tobacco: Never  Substance Use Topics   Alcohol use: Not Currently    Alcohol/week: 3.0 standard drinks of alcohol    Types: 3 Glasses of wine per week    Comment: occasional     CODE STATUS: FULL CODE ADVANCED DIRECTIVES: No, documents requested MOST FORM COMPLETE:  Yes HOSPICE EDUCATION PROVIDED: No  Duration of telephonic encounter and documentation: 30  minutes  [employer], LCSW

## 2023-06-09 ENCOUNTER — Telehealth: Payer: Self-pay | Admitting: Cardiovascular Disease

## 2023-06-09 NOTE — Telephone Encounter (Signed)
Pt c/o BP issue: STAT if pt c/o blurred vision, one-sided weakness or slurred speech  1. What are your last 5 BP readings?   At rest: 147/134  Patient agitated from social situation: 126/124 After focused relaxation: 135/108 After moderate activity: 176/149 After a rest break: 125/103  2. Are you having any other symptoms (ex. Dizziness, headache, blurred vision, passed out)?  Tiredness with exertion  3. What is your BP issue?   Threasa Alpha with [name] is calling to report BP readings. He mentions that patient took his medications over an hour ago. These readings are normal for patient. Rosanne Ashing states a call back is not necessary unless MD has questions. Call may be returned directly to the patient with advisement.

## 2023-06-13 ENCOUNTER — Encounter: Payer: Self-pay | Admitting: Cardiovascular Disease

## 2023-06-13 NOTE — Telephone Encounter (Signed)
Received a call from Rosanne Ashing, PT- he states that patient is still having issues with his blood pressure. He also mentions having a scan of his leg? Which I did not see any documentation on doing this. I did advise with patient and Rosanne Ashing that with recent issues with blood pressure concerns and now leg concerns, we should have him come in to be seen. Scheduled with NP next Monday 06/24. Patient verbalized understanding, thankful for call back.

## 2023-06-13 NOTE — Telephone Encounter (Signed)
Pt c/o BP issue: STAT if pt c/o blurred vision, one-sided weakness or slurred speech  1. What are your last 5 BP readings? 141/117  2. Are you having any other symptoms (ex. Dizziness, headache, blurred vision, passed out)? SOB, Fatigue, and 3 lbs weight gain  3. What is your BP issue? Requesting to speak with a nurse to advise patient.

## 2023-06-13 NOTE — Telephone Encounter (Signed)
Error

## 2023-06-14 ENCOUNTER — Other Ambulatory Visit: Payer: Self-pay | Admitting: Internal Medicine

## 2023-06-14 DIAGNOSIS — R6889 Other general symptoms and signs: Secondary | ICD-10-CM

## 2023-06-14 NOTE — Telephone Encounter (Signed)
Left message for OT, we are aware of the blood pressure issues. He has an appointment Monday to evaluate patient. He is to call back with questions.

## 2023-06-14 NOTE — Telephone Encounter (Signed)
  Pt c/o BP issue: STAT if pt c/o blurred vision, one-sided weakness or slurred speech  1. What are your last 5 BP readings?  139/107 - sitting at rest After 5 minutes, laying down 127/96  2. Are you having any other symptoms (ex. Dizziness, headache, blurred vision, passed out)?   3. What is your BP issue?   Andres Shad OT  [facility] calling to give an update with pt's BP.

## 2023-06-16 ENCOUNTER — Telehealth: Payer: Self-pay | Admitting: Cardiovascular Disease

## 2023-06-16 NOTE — Telephone Encounter (Signed)
Jordan [name] with [name] called to report BP elevated, no Symptoms. BP before therapy today 149/129. Advised per last message from the physician that provider aware and patient will be evaluated Monday regarding BP. If any further recommendations please advise and will call patient

## 2023-06-16 NOTE — Telephone Encounter (Signed)
New Message:     Rosanne Ashing the nurse from [facility] needs to talk to the nurse about patient's high blood pressure.     Marland Kitchen

## 2023-06-16 NOTE — Telephone Encounter (Signed)
Called "Rosanne Ashing" At [facility] to let him know of parameters, again.  LVM on confidential VM regarding Parameters

## 2023-06-17 ENCOUNTER — Ambulatory Visit (HOSPITAL_COMMUNITY): Payer: Medicare Other

## 2023-06-19 NOTE — Progress Notes (Unsigned)
Cardiology Clinic Note   Date: 06/20/2023 ID: Jordan Oms., DOB Jun 27, 1946, MRN [id number]  Primary Cardiologist:  Reatha Harps, MD  Patient Profile    Jordan Ambrosini. is a 77 y.o. male who presents to the clinic today for evaluation of BP.    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 Echo /10/2023: EF 45 to 50%.  Global hypokinesis.  Severe concentric LVH.  Moderate RVH.  Moderately elevated PA pressure.  Severe BAE.  Mild MR.  Mild to moderate AI.  Aortic valve sclerosis/calcification without stenosis.  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 Permanent A-fib. DCCV 10/07/2021: Converted to sinus bradycardia with frequent PACs and PVCs. DCCV 07/07/2022: Converted to sinus bradycardia. NSVT. ZIO heart monitor 02/24/2022: 100% A-fib burden.  Multiple episodes of NSVT up to 16 seconds.  3.2-second pause noted. Hypertension. Chronic hepatitis C. CKD stage IIIa. Prostate cancer. DVT.      History of Present Illness    Jordan [name]. is a longtime patient of cardiology the above outlined history.  Patient was last seen in the office by Dr. Gala Romney on 04/11/2019 for routine follow-up.  Patient was doing well at that time.  He was found to be hypotensive and hydralazine was decreased.  He was last seen by Dr. Flora Lipps on 05/05/2023 for routine follow-up.  He was down 5 pounds and had improved blood pressure control at the time of his visit.  He reported balance issues and was referred for home health physical therapy.  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  Office has been contacted several times by [facility] for concerns of BP and heart rate.  Today, patient accompanied by his sister.  Patient reports home health PT  has been having a hard time getting his BP and heart rate after repeated tries BP is typically high particularly DBP.  He reports having a hard time getting his blood pressure on home monitor as well.  Today in office pulse ox was reading heart rate as 24.  Manual BP was difficult to obtain and Dynamap was not picking up on BP either.  I was able to auscultate BP at 126/82.  Patient denies headaches or vision changes.  He reports positional dizziness on occasion.  Patient is requesting parameters for SBP and DBP for physical therapy so he does not miss out on time working with him.  He wonders how hard he is able to push himself when he is doing his exercises.  PT is now reduced to 1 day a week at home.  He continues to have balance difficulties and walks with a rollator.  Otherwise he feels he is doing well. Patient denies shortness of breath or dyspnea on exertion. No chest pain, pressure, or tightness.  Lower extremity edema is well-managed with Lasix, compression socks and elevation.  No orthopnea, or PND. No palpitations.  Patient has no cardiac awareness of arrhythmia.  ROS: All other systems reviewed and are otherwise negative except as noted in History of Present Illness.  Studies Reviewed    EKG Interpretation  Date/Time:  Monday June 20 2023 14:58:09 EDT Ventricular Rate:  77 PR Interval:    QRS Duration: 88 QT Interval:  412 QTC Calculation: 466 R Axis:   -67 Text Interpretation: Atrial fibrillation Left axis deviation Pulmonary disease pattern Inferior infarct (cited on or  before 01-Feb-2023) When compared with ECG of 19-Apr-2023 16:50, No significant change was found Confirmed by Carlos Levering [phone]) on 06/20/2023 3:01:55 PM   Risk Assessment/Calculations     CHA2DS2-VASc Score = 4   This indicates a 4.8% annual risk of stroke. The patient's score is based upon: CHF History: 1 HTN History: 1 Diabetes History: 0 Stroke History: 0 Vascular Disease History: 0 Age Score:  2 Gender Score: 0             Physical Exam    VS:  BP 126/82 (BP Location: Left Arm, Patient Position: Sitting, Cuff Size: Normal)   Pulse 77   Ht 5' 10.5" (1.791 m)   Wt 191 lb 6.4 oz (86.8 kg)   SpO2 95%   BMI 27.07 kg/m  , BMI Body mass index is 27.07 kg/m.  GEN: Well nourished, well developed, in no acute distress. Neck: No JVD or carotid bruits. Cardiac: Irregular rhythm, controlled rate. No murmurs. No rubs or gallops.   Respiratory:  Respirations regular and unlabored. Clear to auscultation without rales, wheezing or rhonchi. GI: Soft, nontender, nondistended. Extremities: Radials/DP/PT 2+ and equal bilaterally. No clubbing or cyanosis.  Mild bilateral lower extremity edema with compression socks in place. Skin: Warm and dry, no rash. Neuro: Strength intact.  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  Echo January 2024 showed EF 45 to 50%, severe LVH, moderate RVH, severe BAE, mild to moderate AR, mild MR.  Patient reports lower extremity edema well-managed with Lasix, compression socks, and elevation.  No shortness of breath, DOE, orthopnea, or PND.  Patient is having continued balance issues.  He walks with a rollator.  He is working with PT to gain strength and improve balance.  Mild lower extremity edema bilaterally otherwise euvolemic and well compensated on exam.  Continue carvedilol, Farxiga, Lasix, hydralazine, isosorbide, losartan, tafamidis, eplontersen. Permanent A-fib.  ZIO March 2023 showed 100% A-fib burden.  Patient has no cardiac awareness of arrhythmia.  EKG shows A-fib with heart rate 77 bpm.  Denies spontaneous bleeding concerns.  Continue carvedilol and Eliquis.Appropriate Eliquis dose. Hypertension: BP today 126/82.  Patient denies headaches, dizziness or vision changes. Continue carvedilol, hydralazine, isosorbide, losartan.  Patient would like Dr. Flora Lipps to provide BP parameters to physical  therapy.  Per prior telephone note he would like SBP >80 <180.  Will reach out for further parameters for DBP and contact patient via MyChart.  Disposition: Return for previously scheduled visit with Dr. Flora Lipps or sooner as needed.         Signed, Etta Grandchild. Sanjiv Castorena, DNP, NP-C

## 2023-06-20 ENCOUNTER — Ambulatory Visit: Payer: Medicare Other | Admitting: Student

## 2023-06-20 ENCOUNTER — Encounter: Payer: Self-pay | Admitting: Student

## 2023-06-20 VITALS — BP 126/82 | HR 77 | Ht 70.5 in | Wt 191.4 lb

## 2023-06-20 DIAGNOSIS — I10 Essential (primary) hypertension: Secondary | ICD-10-CM | POA: Diagnosis not present

## 2023-06-20 DIAGNOSIS — I5022 Chronic systolic (congestive) heart failure: Secondary | ICD-10-CM

## 2023-06-20 DIAGNOSIS — I428 Other cardiomyopathies: Secondary | ICD-10-CM | POA: Diagnosis not present

## 2023-06-20 DIAGNOSIS — I4821 Permanent atrial fibrillation: Secondary | ICD-10-CM | POA: Diagnosis not present

## 2023-06-20 NOTE — Patient Instructions (Signed)
Medication Instructions:  Your physician recommends that you continue on your current medications as directed. Please refer to the Current Medication list given to you today.  *If you need a refill on your cardiac medications before your next appointment, please call your pharmacy*   Lab Work: NONE ordered at this time of appointment     Testing/Procedures: NONE ordered at this time of appointment     Follow-Up: At [facility], you and your health needs are our priority.  As part of our continuing mission to provide you with exceptional heart care, we have created designated Provider Care Teams.  These Care Teams include your primary Cardiologist (physician) and Advanced Practice Providers (APPs -  Physician Assistants and Nurse Practitioners) who all work together to provide you with the care you need, when you need it.  We recommend signing up for the patient portal called "MyChart".  Sign up information is provided on this After Visit Summary.  MyChart is used to connect with patients for Virtual Visits (Telemedicine).  Patients are able to view lab/test results, encounter notes, upcoming appointments, etc.  Non-urgent messages can be sent to your provider as well.   To learn more about what you can do with MyChart, go to [email or web address].    Your next appointment:    Keep follow up   Provider:   Reatha Harps, MD     Other Instructions

## 2023-06-21 ENCOUNTER — Ambulatory Visit (HOSPITAL_COMMUNITY)
Admission: RE | Admit: 2023-06-21 | Discharge: 2023-06-21 | Disposition: A | Discharge status: Home / Self Care | Payer: Medicare Other | Source: Ambulatory Visit | Attending: Internal Medicine | Admitting: Internal Medicine

## 2023-06-21 DIAGNOSIS — I4821 Permanent atrial fibrillation: Secondary | ICD-10-CM | POA: Diagnosis present

## 2023-06-21 DIAGNOSIS — I428 Other cardiomyopathies: Secondary | ICD-10-CM | POA: Insufficient documentation

## 2023-06-21 DIAGNOSIS — I5022 Chronic systolic (congestive) heart failure: Secondary | ICD-10-CM | POA: Insufficient documentation

## 2023-06-21 DIAGNOSIS — R6889 Other general symptoms and signs: Secondary | ICD-10-CM

## 2023-06-21 DIAGNOSIS — I10 Essential (primary) hypertension: Secondary | ICD-10-CM | POA: Insufficient documentation

## 2023-06-21 NOTE — Progress Notes (Signed)
Bilateral lower extremity arterial duplex study completed.   Please see CV Procedures for preliminary results.  Natavia Sublette, RVT  3:14 PM 06/21/23

## 2023-06-23 ENCOUNTER — Telehealth: Payer: Self-pay | Admitting: Cardiovascular Disease

## 2023-06-23 NOTE — Telephone Encounter (Signed)
Calling to make our office aware of patient's elevated bp 137/115 and weight gain of 6lbs in 7 days. No sob, swelling of the ankles, and lungs are clear.

## 2023-06-23 NOTE — Telephone Encounter (Signed)
I do not really know what to do about patient's BP readings. What are they using to take it manual or digital cuff? As far as weight gain, is this weight being compared to readings being taken on the same scale, at the same time, in the same state of dress?

## 2023-06-23 NOTE — Telephone Encounter (Signed)
Awaiting call back from Perry with [facility].

## 2023-06-23 NOTE — Telephone Encounter (Signed)
Pt seen three days ago in clinic by [place] for same:  Office has been contacted several times by [facility] for concerns of BP and heart rate. Today, patient accompanied by his sister.  Patient reports home health PT has been having a hard time getting his BP and heart rate after repeated tries BP is typically high particularly DBP.  He reports having a hard time getting his blood pressure on home monitor as well.  Today in office pulse ox was reading heart rate as 24.  Manual BP was difficult to obtain and [facility] was not picking up on BP either.  I was able to auscultate BP at 126/82. Lower extremity edema is well-managed with Lasix, compression socks and elevation. He reports positional dizziness on occasion. Patient is requesting parameters for SBP and DBP for physical therapy so he does not miss out on time working with him. Patient reports lower extremity edema well-managed with Lasix, compression socks, and elevation.  No shortness of breath, DOE, orthopnea, or PND.  Patient is having continued balance issues.  He walks with a rollator.  He is working with PT to gain strength and improve balance.  Mild lower extremity edema bilaterally otherwise euvolemic and well compensated on exam. Patient would like Dr. Flora Lipps to provide BP parameters to physical therapy.  Per prior telephone note he would like SBP >80 <180.  Will reach out for further parameters for DBP and contact patient via MyChart.  Jordan Levering, NP.  MyChart message below: 06/22/23 11:58 AM Hi Jordan [name]! I received recommendations from Dr. Flora Lipps. Blood pressure parameters 80-180/60-110. If you get readings beyond these parameters, please contact the office.  Thank you! Jordan Levering, NP  Returned call to [place] w/[facility] and left message asking for her to clarify what today's weight reading was. Also called patient, but no answer with him either and no voicemail service available. Will route call to [place], NP for her  thoughts on what seems to be a recurring issue with home health PT since DBP are outside of recommended parameters, but unlikely to be accurate at this point.

## 2023-06-27 NOTE — Telephone Encounter (Signed)
  Follow-up message from Threasa Alpha at [facility]. He visited the patient today and noted the following:  Blood Pressure (seated, before exertion): 156/131 Weight: 189.6 lbs (an increase of 3 lbs from the last weight check 1 to 2 days ago)

## 2023-06-27 NOTE — Telephone Encounter (Signed)
Rosanne Ashing was returning LPN call and stated she can contact the pt. Please advise

## 2023-06-27 NOTE — Telephone Encounter (Signed)
Attempt to call the pt, unable to leave VM message. Called Jim with [facility] left voicemail message to call the clinic.

## 2023-06-27 NOTE — Telephone Encounter (Signed)
Call to patient, call rings with no voice mail.   Unable to LM

## 2023-06-28 NOTE — Telephone Encounter (Signed)
Cal to patient for daily weights.  He states he should be home soon and will call back with the results

## 2023-06-28 NOTE — Telephone Encounter (Addendum)
Patient checks weight daily. He was 191 today and 189.6 yesterday.  States swelling in lower legs yesterday but Elevated and took Lasix 40 mg and seems to resolve.  He states takes it daily but had missed a dose over the weekend. He is careful with sodium but may have had increase due to eating someone else's cooking. No SOB unless exertion.  He states he is to be watching this and not to push to far. He was to call with daily weights but has not returned call yet.  His weight today was same as last OV in May  Patient states trying to take BP but BP monitor gave error.  He states it isn't consistent and those coming into house for therapy cannot get  his diastolic when they check. Dr. Flora Lipps note for Blood pressure parameters 80-180/60-110. If you get readings beyond these parameters, please contact the office.  Therapist stated BP as 156/131at the las visit yesterday.  Patient asymptomatic and again states they have difficulty with "the bottom number, they can't ever get it". Unsure how true a reading this is.   No continued issues with Edema or BP at this time.  Will have patient continue to monitor both for any changes. Give weigh parameters of gain as well.   Any other recommendations??

## 2023-06-29 NOTE — Telephone Encounter (Signed)
Spoke with patient and advised the information from the provider.  Patient is to call if any further issues with BP or edema.

## 2023-07-06 ENCOUNTER — Telehealth: Payer: Self-pay | Admitting: Cardiovascular Disease

## 2023-07-06 NOTE — Telephone Encounter (Signed)
Patients BP is 144/113...took medicine several hours ago Weight gain of 194 Last weight was 189 on the 7/1  Over week the weekend, missed a days worth of medicine Increased fatigue more than usual  Reported by Threasa Alpha [facility] PT) Phone number is [phone]

## 2023-07-06 NOTE — Telephone Encounter (Signed)
Called [facility] Threasa Alpha, no answer, called patient and spoke with patient and [facility]. RN Angelique Blonder concerned that BP remains high, today it is 144/113 several hours after taking his morning medications. His hydralazine was increased by PCP at visit on 06/22/23. Angelique Blonder reports that patient missed a day's worth of medication recently, has increased swelling in legs as well as fatigue. He denies SOB or chest pain, blurry vision but does report a weight gain of 8 lbs in 2 weeks. Patient reports that he took an extra lasix yesterday but doesn't feel he's seen any improvement. He states he plans to take another extra lasix today. Per epic, lasix was increased from 20 mg to 40 mg daily in May 2024.   Made patient an appt w/ DOD tomorrow 07/07/23 at 3:30.

## 2023-07-07 ENCOUNTER — Ambulatory Visit: Payer: Medicare Other | Attending: Internal Medicine | Admitting: Internal Medicine

## 2023-07-07 ENCOUNTER — Other Ambulatory Visit (HOSPITAL_COMMUNITY): Payer: Self-pay

## 2023-07-07 VITALS — BP 138/98 | HR 69 | Ht 72.0 in | Wt 198.2 lb

## 2023-07-07 DIAGNOSIS — Z79899 Other long term (current) drug therapy: Secondary | ICD-10-CM | POA: Diagnosis not present

## 2023-07-07 MED ORDER — FUROSEMIDE 40 MG PO TABS: 40.0000 mg | ORAL_TABLET | Freq: Two times a day (BID) | ORAL | 1 refills | Status: DC

## 2023-07-07 MED ORDER — ISOSORBIDE MONONITRATE ER 60 MG PO TB24: 60.0000 mg | ORAL_TABLET | Freq: Every day | ORAL | 3 refills | Status: DC

## 2023-07-07 NOTE — Progress Notes (Signed)
Cardiology Office Note:    Date:  07/07/2023   ID:  Jordan [name]., DOB 12-05-1946, MRN [id number]  PCP:  Jordan Ishihara, MD   [facility] Providers Cardiologist:  Jordan Harps, MD     Referring MD: Jordan [name],*   No chief complaint on file. Leg edema  History of Present Illness:    Jordan [name]. is a 77 y.o. 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 He is a patient of Dr. Flora [name]. He is coming into DOD for issues with elevated BP and leg swelling. Prior EKG in June 24th showed afib.   Mr. Jordan [name] presents with difficulty managing his blood pressure, recent reading today of 138/98 mmhg, and fluid retention causing a 10-pound weight gain over two weeks. His weight fluctuates. His blood pressures have been higher than today he notes. He experiences leg swelling, alleviated by  compression socks, and leg elevation. He has increased his lasix to 40 mg BID. However, he continues to have leg edema and shortness of breath after short walks. He is on tafamidis for amyloidosis.  Past Medical History:  Diagnosis Date   Atrial fib/flutter, transient (HCC)    Hx   Atrial fibrillation (HCC)    coumadin   Colon cancer (HCC)    family hx   Diverticulosis    Gout    Hemorrhoids    Hepatitis C    History of DVT (deep vein thrombosis)    History of nuclear stress test 04/2002   exercise; normal study    Hypertension    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HCC)    history of    Personal history of colonic polyps    Prostate cancer [facility])    radical prostatectomy     Past Surgical History:  Procedure Laterality Date   CARDIAC CATHETERIZATION  2007   no occlusive CAD   CARDIOVERSION N/A 10/07/2021   Procedure: CARDIOVERSION;  Surgeon: Jordan Nose, MD;  Location: [facility] ENDOSCOPY;  Service: Cardiovascular;  Laterality: N/A;   CARDIOVERSION N/A  07/07/2022   Procedure: CARDIOVERSION;  Surgeon: Jordan Sprague, MD;  Location: [facility] ENDOSCOPY;  Service: Cardiovascular;  Laterality: N/A;   COLONOSCOPY  2002,2007   Jordan [name]   PROSTATECTOMY     PROSTATECTOMY  04-04   Dr. Isabel [name]   TRANSTHORACIC ECHOCARDIOGRAM  05/2006   EF ~60%; LV systolic function normal; mild focal basal septal hypertrophy; AV thickness mildly increased; LA mod-markedly dilate    Current Medications: Current Meds  Medication Sig   allopurinol (ZYLOPRIM) 100 MG tablet Take 1 tablet (100 mg total) by mouth daily.   apixaban (ELIQUIS) 5 MG TABS tablet Take 1 tablet (5 mg total) by mouth 2 (two) times daily.   atorvastatin (LIPITOR) 40 MG tablet Take 1 tablet (40 mg total) by mouth daily.   Blood Pressure Monitor KIT Use as directed by your health care provider   carvedilol (COREG) 25 MG tablet Take 25 mg by mouth 2 (two) times daily.   Eplontersen Sodium (WAINUA) 45 MG/0.8ML SOAJ Inject 45 mg into the skin every 30 (thirty) days.   FARXIGA 10 MG TABS tablet Take 10 mg by mouth daily.   hydrALAZINE (APRESOLINE) 100 MG tablet TAKE 1 TABLET(100 MG) BY MOUTH THREE TIMES DAILY   losartan (COZAAR) 100 MG tablet Take 100 mg by mouth daily.   potassium chloride SA (KLOR-CON M) 20 MEQ tablet Take  1 tablet (20 mEq total) by mouth 2 (two) times daily.   Tafamidis (VYNDAMAX) 61 MG CAPS Take 61 mg (1 tablet) by mouth daily.   [DISCONTINUED] furosemide (LASIX) 40 MG tablet Take 1 tablet (40 mg total) by mouth daily.   [DISCONTINUED] isosorbide mononitrate (IMDUR) 30 MG 24 hr tablet Take 1 tablet (30 mg total) by mouth daily.     Allergies:   Patient has no known allergies.   Social History   Socioeconomic History   Marital status: Divorced    Spouse name: Not on file   Number of children: 1   Years of education: Not on file   Highest education level: Not on file  Occupational History   Occupation: retired    Associate Professor: Audiological scientist [employer]  Tobacco Use   Smoking  status: Never   Smokeless tobacco: Never  Vaping Use   Vaping status: Never Used  Substance and Sexual Activity   Alcohol use: Not Currently    Alcohol/week: 3.0 standard drinks of alcohol    Types: 3 Glasses of wine per week    Comment: occasional    Drug use: No   Sexual activity: Yes  Other Topics Concern   Not on file  Social History Narrative   Left Handed   Lives in a one story home   Drinks Caffeine - once a day    Social Determinants of Health   Financial Resource Strain: Not on file  Food Insecurity: Not on file  Transportation Needs: Not on file  Physical Activity: Not on file  Stress: Not on file  Social Connections: Not on file     Family History: The patient's family history includes Brain cancer in his mother; Colon cancer in his mother; Stroke in his father. There is no history of Rectal cancer, Stomach cancer, or Esophageal cancer.  ROS:   Please see the history of present illness.     All other systems reviewed and are negative.  EKGs/Labs/Other Studies Reviewed:    The following studies were reviewed today:   Recent Labs: 04/19/2023: ALT 26; Hemoglobin 15.4; Platelets 215 05/09/2023: B Natriuretic Peptide 3,054.8; BUN 23; Creatinine, Ser 1.78; Potassium 4.8; Sodium 139  Recent Lipid Panel    Component Value Date/Time   CHOL 150 04/28/2021 0446   CHOL 157 05/03/2018 1017   TRIG 51 04/28/2021 0446   HDL 37 (L) 04/28/2021 0446   HDL 55 05/03/2018 1017   CHOLHDL 4.1 04/28/2021 0446   VLDL 10 04/28/2021 0446   LDLCALC 103 (H) 04/28/2021 0446   LDLCALC 88 05/03/2018 1017     Risk Assessment/Calculations:    Physical Exam:    VS:  Vitals:   07/07/23 1541  BP: (!) 138/98  Pulse: 69  SpO2: 99%      BP (!) 138/98 (BP Location: Left Arm, Patient Position: Sitting, Cuff Size: Normal)   Pulse 69   Ht 6' (1.829 m)   Wt 198 lb 3.2 oz (89.9 kg)   SpO2 99%   BMI 26.88 kg/m     Wt Readings from Last 3 Encounters:  07/07/23 198 lb 3.2 oz  (89.9 kg)  06/20/23 191 lb 6.4 oz (86.8 kg)  05/05/23 190 lb 12.8 oz (86.5 kg)     GEN:  Well nourished, well developed in no acute distress HEENT: Normal NECK: No JVD CARDIAC: RRR, no murmurs, rubs, gallops RESPIRATORY:  Clear to auscultation without rales, wheezing or rhonchi  ABDOMEN: Soft, non-tender, non-distended MUSCULOSKELETAL:  No edema; No deformity  SKIN: Warm and dry NEUROLOGIC:  Alert and oriented x 3 PSYCHIATRIC:  Normal affect   ASSESSMENT:    Cardiac amyloid - pitting BL LE edema, BNP 3,054. Will increase his lasix to 80 mg BID for 5 days and have him FU - on tafamidis - continue farxiga  HTN - increase his imdur to 90 - continue losartan 100 mg daily - continue hydralazine 100 mg TID - continue coreg 25 mg BID  Longstanding Persistent Afib - rate controlled on coreg  - continue eliquis PLAN:    In order of problems listed above:  Increase imdur Increase lasix to 80 mg BID for 5 days BMET 1 week FU APP in 2 weeks     Medication Adjustments/Labs and Tests Ordered: Current medicines are reviewed at length with the patient today.  Concerns regarding medicines are outlined above.  Orders Placed This Encounter  Procedures   Basic metabolic panel   Meds ordered this encounter  Medications   isosorbide mononitrate (IMDUR) 60 MG 24 hr tablet    Sig: Take 1 tablet (60 mg total) by mouth daily.    Dispense:  90 tablet    Refill:  3   furosemide (LASIX) 40 MG tablet    Sig: Take 1 tablet (40 mg total) by mouth 2 (two) times daily.    Dispense:  186 tablet    Refill:  1    Please cancel all previous orders for current medication. Change in dosage or pill size.    Patient Instructions  Medication Instructions:  INCREASE isosorbide mononitrate to 60mg  daily   INCREASE lasix to 80mg  twice daily for 5 days -- then go back to 40mg  twice daily  *If you need a refill on your cardiac medications before your next appointment, please call your  pharmacy*   Lab Work: BMET (non-fasting) in 1 week  If you have labs (blood work) drawn today and your tests are completely normal, you will receive your results only by: MyChart Message (if you have MyChart) OR A paper copy in the mail If you have any lab test that is abnormal or we need to change your treatment, we will call you to review the results.   Follow-Up: At [facility], you and your health needs are our priority.  As part of our continuing mission to provide you with exceptional heart care, we have created designated Provider Care Teams.  These Care Teams include your primary Cardiologist (physician) and Advanced Practice Providers (APPs -  Physician Assistants and Nurse Practitioners) who all work together to provide you with the care you need, when you need it.  We recommend signing up for the patient portal called "MyChart".  Sign up information is provided on this After Visit Summary.  MyChart is used to connect with patients for Virtual Visits (Telemedicine).  Patients are able to view lab/test results, encounter notes, upcoming appointments, etc.  Non-urgent messages can be sent to your provider as well.   To learn more about what you can do with MyChart, go to [email or web address].    Your next appointment:   1 month(s)  Provider:    PA or NP    Signed, Maisie Fus, MD  07/07/2023 4:05 PM    [facility]

## 2023-07-07 NOTE — Patient Instructions (Signed)
Medication Instructions:  INCREASE isosorbide mononitrate to 60mg  daily   INCREASE lasix to 80mg  twice daily for 5 days -- then go back to 40mg  twice daily  *If you need a refill on your cardiac medications before your next appointment, please call your pharmacy*   Lab Work: BMET (non-fasting) in 1 week  If you have labs (blood work) drawn today and your tests are completely normal, you will receive your results only by: MyChart Message (if you have MyChart) OR A paper copy in the mail If you have any lab test that is abnormal or we need to change your treatment, we will call you to review the results.   Follow-Up: At [facility], you and your health needs are our priority.  As part of our continuing mission to provide you with exceptional heart care, we have created designated Provider Care Teams.  These Care Teams include your primary Cardiologist (physician) and Advanced Practice Providers (APPs -  Physician Assistants and Nurse Practitioners) who all work together to provide you with the care you need, when you need it.  We recommend signing up for the patient portal called "MyChart".  Sign up information is provided on this After Visit Summary.  MyChart is used to connect with patients for Virtual Visits (Telemedicine).  Patients are able to view lab/test results, encounter notes, upcoming appointments, etc.  Non-urgent messages can be sent to your provider as well.   To learn more about what you can do with MyChart, go to [email or web address].    Your next appointment:   1 month(s)  Provider:    PA or NP

## 2023-07-13 ENCOUNTER — Other Ambulatory Visit: Payer: Self-pay

## 2023-07-13 ENCOUNTER — Telehealth: Payer: Self-pay | Admitting: Pharmacist Clinician (PhC)/ Clinical Pharmacy Specialist

## 2023-07-13 ENCOUNTER — Other Ambulatory Visit (HOSPITAL_COMMUNITY): Payer: Self-pay

## 2023-07-13 NOTE — Telephone Encounter (Signed)
PA approved to 07/12/2024  [facility]

## 2023-07-13 NOTE — Telephone Encounter (Signed)
Vyndamax PA sent to [facility]    Key  B

## 2023-07-14 ENCOUNTER — Other Ambulatory Visit: Payer: Self-pay

## 2023-07-14 ENCOUNTER — Other Ambulatory Visit (HOSPITAL_COMMUNITY): Payer: Self-pay

## 2023-07-18 ENCOUNTER — Other Ambulatory Visit: Payer: Self-pay

## 2023-07-20 ENCOUNTER — Telehealth: Payer: Self-pay | Admitting: Cardiovascular Disease

## 2023-07-20 NOTE — Telephone Encounter (Signed)
Information noted.

## 2023-07-20 NOTE — Telephone Encounter (Signed)
Rosanne Ashing from [facility] would like to inform us that the patient is being discharged from home health services. Rosanne Ashing stated that the patient is stable and is doing well. Rosanne Ashing stated if needed, we can call him at [phone].

## 2023-08-04 ENCOUNTER — Other Ambulatory Visit (HOSPITAL_COMMUNITY): Payer: Self-pay

## 2023-08-08 ENCOUNTER — Ambulatory Visit: Payer: Medicare Other | Admitting: Cardiovascular Disease

## 2023-08-10 ENCOUNTER — Other Ambulatory Visit (HOSPITAL_COMMUNITY): Payer: Self-pay

## 2023-08-18 NOTE — Progress Notes (Signed)
Cardiology Office Note:   Date:  08/19/2023  NAME:  Jordan [name].    MRN: [id number] DOB:  [birth year]   PCP:  Lorenda Ishihara, MD  Cardiologist:  Reatha Harps, MD  Electrophysiologist:  None   Referring MD: Lorenda Ishihara,*   Chief Complaint  Patient presents with   Follow-up         History of Present Illness:   Jordan [name]. is a 77 y.o. male with a hx of cardiac amyloid, HTN, CKD 3b who presents for follow-up.   Blood pressure 122/84.  Values are much improved.  Now on Eplontersen.  I suspect this is improving.  Also on Vyndamax.  He does have evidence of 2+ pitting edema.  Currently on Lasix 40 mg twice daily.  He will continue this for 2 more days.  Hopefully this will improve.  He is working on salt reduction.  Reports no chest pain.  Does get short of breath with activity.  Still working with physical therapy.  He did suffer a fall which seems to be mechanical.  I have encouraged him to remain active.  Overall he seems to be doing well today.  On appropriate medical therapy.  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 Persistent Afib  -DCCV 07/07/2022 -> ERAF 07/21/2022 4. CKD 3b  Past Medical History: Past Medical History:  Diagnosis Date   Atrial fib/flutter, transient (HCC)    Hx   Atrial fibrillation (HCC)    coumadin   Colon cancer (HCC)    family hx   Diverticulosis    Gout    Hemorrhoids    Hepatitis C    History of DVT (deep vein thrombosis)    History of nuclear stress test 04/2002   exercise; normal study    Hypertension    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HCC)    history of    Personal history of colonic polyps    Prostate cancer [facility])    radical prostatectomy     Past Surgical History: Past Surgical History:  Procedure Laterality Date   CARDIAC CATHETERIZATION  2007   no occlusive CAD   CARDIOVERSION N/A 10/07/2021   Procedure:  CARDIOVERSION;  Surgeon: Chrystie Nose, MD;  Location: [facility] ENDOSCOPY;  Service: Cardiovascular;  Laterality: N/A;   CARDIOVERSION N/A 07/07/2022   Procedure: CARDIOVERSION;  Surgeon: Meriam Sprague, MD;  Location: [facility] ENDOSCOPY;  Service: Cardiovascular;  Laterality: N/A;   COLONOSCOPY  2002,2007   Lavina Hamman   PROSTATECTOMY     PROSTATECTOMY  04-04   Dr. Isabel Caprice   TRANSTHORACIC ECHOCARDIOGRAM  05/2006   EF ~60%; LV systolic function normal; mild focal basal septal hypertrophy; AV thickness mildly increased; LA mod-markedly dilate    Current Medications: Current Meds  Medication Sig   allopurinol (ZYLOPRIM) 100 MG tablet Take 1 tablet (100 mg total) by mouth daily.   apixaban (ELIQUIS) 5 MG TABS tablet Take 1 tablet (5 mg total) by mouth 2 (two) times daily.   atorvastatin (LIPITOR) 40 MG tablet Take 1 tablet (40 mg total) by mouth daily.   Blood Pressure Monitor KIT Use as directed by your health care provider   carvedilol (COREG) 25 MG tablet Take 25 mg by mouth 2 (two) times daily.   Eplontersen Sodium (WAINUA) 45 MG/0.8ML SOAJ Inject 45 mg into the skin every 30 (thirty) days.   FARXIGA 10 MG TABS tablet Take 10 mg by mouth daily.  furosemide (LASIX) 40 MG tablet Take 1 tablet (40 mg total) by mouth 2 (two) times daily.   hydrALAZINE (APRESOLINE) 100 MG tablet TAKE 1 TABLET(100 MG) BY MOUTH THREE TIMES DAILY   isosorbide mononitrate (IMDUR) 60 MG 24 hr tablet Take 1 tablet (60 mg total) by mouth daily.   losartan (COZAAR) 100 MG tablet Take 100 mg by mouth daily.   potassium chloride SA (KLOR-CON M) 20 MEQ tablet Take 1 tablet (20 mEq total) by mouth 2 (two) times daily.   Tafamidis (VYNDAMAX) 61 MG CAPS Take 61 mg (1 tablet) by mouth daily.     Allergies:    Patient has no known allergies.   Social History: Social History   Socioeconomic History   Marital status: Divorced    Spouse name: Not on file   Number of children: 1   Years of education: Not on file    Highest education level: Not on file  Occupational History   Occupation: retired    Associate Professor: Audiological scientist [employer]  Tobacco Use   Smoking status: Never   Smokeless tobacco: Never  Vaping Use   Vaping status: Never Used  Substance and Sexual Activity   Alcohol use: Not Currently    Alcohol/week: 3.0 standard drinks of alcohol    Types: 3 Glasses of wine per week    Comment: occasional    Drug use: No   Sexual activity: Yes  Other Topics Concern   Not on file  Social History Narrative   Left Handed   Lives in a one story home   Drinks Caffeine - once a day    Social Determinants of Health   Financial Resource Strain: Not on file  Food Insecurity: Not on file  Transportation Needs: Not on file  Physical Activity: Not on file  Stress: Not on file  Social Connections: Not on file     Family History: The patient's family history includes Brain cancer in his mother; Colon cancer in his mother; Stroke in his father. There is no history of Rectal cancer, Stomach cancer, or Esophageal cancer.  ROS:   All other ROS reviewed and negative. Pertinent positives noted in the HPI.     EKGs/Labs/Other Studies Reviewed:   The following studies were personally reviewed by me today:  EKG:  EKG is ordered today.    EKG Interpretation Date/Time:  Friday August 19 2023 13:52:31 EDT Ventricular Rate:  85 PR Interval:    QRS Duration:  92 QT Interval:  410 QTC Calculation: 487 R Axis:   -70  Text Interpretation: Atrial fibrillation with premature ventricular or aberrantly conducted complexes Left axis deviation Pulmonary disease pattern Minimal voltage criteria for LVH, may be normal variant ( Cornell product ) Inferior infarct (cited on or before 01-Feb-2023) When compared with ECG of 20-Jun-2023 14:58, No significant change was found Confirmed by Lennie Odor ([id number]) on 08/19/2023 2:10:59 PM   Recent Labs: 04/19/2023: ALT 26; Hemoglobin 15.4; Platelets 215 05/09/2023: B Natriuretic  Peptide 3,054.8; BUN 23; Creatinine, Ser 1.78; Potassium 4.8; Sodium 139   Recent Lipid Panel    Component Value Date/Time   CHOL 150 04/28/2021 0446   CHOL 157 05/03/2018 1017   TRIG 51 04/28/2021 0446   HDL 37 (L) 04/28/2021 0446   HDL 55 05/03/2018 1017   CHOLHDL 4.1 04/28/2021 0446   VLDL 10 04/28/2021 0446   LDLCALC 103 (H) 04/28/2021 0446   LDLCALC 88 05/03/2018 1017    Physical Exam:   VS:  BP 122/84  Pulse 85   Ht 5' 9.5" (1.765 m)   Wt 192 lb 9.6 oz (87.4 kg)   SpO2 99%   BMI 28.03 kg/m    Wt Readings from Last 3 Encounters:  08/19/23 192 lb 9.6 oz (87.4 kg)  07/07/23 198 lb 3.2 oz (89.9 kg)  06/20/23 191 lb 6.4 oz (86.8 kg)    General: Well nourished, well developed, in no acute distress Head: Atraumatic, normal size  Eyes: PEERLA, EOMI  Neck: Supple, no JVD Endocrine: No thryomegaly Cardiac: Normal S1, S2; irregular rhythm, no murmurs rubs or gallops Lungs: Clear to auscultation bilaterally, no wheezing, rhonchi or rales  Abd: Soft, nontender, no hepatomegaly  Ext: 2+ pitting edema Musculoskeletal: No deformities, BUE and BLE strength normal and equal Skin: Warm and dry, no rashes   Neuro: Alert and oriented to person, place, time, and situation, CNII-XII grossly intact, no focal deficits  Psych: Normal mood and affect   ASSESSMENT:   Jordan [name]. is a 77 y.o. male who presents for the following: 1. Hereditary cardiac amyloidosis (HCC)   2. Chronic systolic heart failure (HCC)   3. NICM (nonischemic cardiomyopathy) (HCC)   4. Permanent atrial fibrillation (HCC)   5. Primary hypertension     PLAN:   1. Hereditary cardiac amyloidosis (HCC) 2. Chronic systolic heart failure (HCC) 3. NICM (nonischemic cardiomyopathy) (HCC) -No history of hereditary cardiac amyloidosis.  Currently on Eplontersen and tafamidis.  Doing well on these.  Blood pressure is less labile.  He will continue carvedilol 25 mg twice daily, losartan 100 mg daily, Imdur 60 mg  daily, hydralazine 100 mg 3 times daily.  Slightly volume up on exam.  Continue Lasix 40 mg twice daily.  Hopefully this will improve over the next 2 or 3 days.  I would like to check a BMP today.  He is on Farxiga 10 mg daily.  He will continue to work with physical therapy.  Overall he seems to be doing better.  I encouraged him to remain active and continue with current regimen.  4. Permanent atrial fibrillation (HCC) -Failed cardioversion and failed amiodarone therapy.  Continue with rate control for now.  5. Primary hypertension -Much better today.  Continue current regimen which includes carvedilol 25 mg 3 times daily, hydralazine 100 mg 3 times daily, Imdur 60 mg daily, losartan 100 mg daily.      Disposition: Return in about 3 months (around 11/19/2023).  Medication Adjustments/Labs and Tests Ordered: Current medicines are reviewed at length with the patient today.  Concerns regarding medicines are outlined above.  Orders Placed This Encounter  Procedures   Basic metabolic panel   EKG 12-Lead   No orders of the defined types were placed in this encounter.  Patient Instructions  Medication Instructions:  Take Lasix 40 mg twice a day for 3 more days then take 40 mg daily Call if continues to have swelling Continue all other medications *If you need a refill on your cardiac medications before your next appointment, please call your pharmacy*   Lab Work: Bmet today   Testing/Procedures: None ordered   Follow-Up: At [facility], you and your health needs are our priority.  As part of our continuing mission to provide you with exceptional heart care, we have created designated Provider Care Teams.  These Care Teams include your primary Cardiologist (physician) and Advanced Practice Providers (APPs -  Physician Assistants and Nurse Practitioners) who all work together to provide you with the care you need, when  you need it.  We recommend signing up for the patient  portal called "MyChart".  Sign up information is provided on this After Visit Summary.  MyChart is used to connect with patients for Virtual Visits (Telemedicine).  Patients are able to view lab/test results, encounter notes, upcoming appointments, etc.  Non-urgent messages can be sent to your provider as well.   To learn more about what you can do with MyChart, go to [email or web address].    Your next appointment:  3 to 4 months    Provider: Dr.Daveda Larock      Time Spent with Patient: I have spent a total of 35 minutes with patient reviewing hospital notes, telemetry, EKGs, labs and examining the patient as well as establishing an assessment and plan that was discussed with the patient.  > 50% of time was spent in direct patient care.  Signed, Lenna Gilford. Flora Lipps, MD, [facility]  [facility]  [address] [place], [place] [address] [phone]  08/19/2023 2:42 PM

## 2023-08-19 ENCOUNTER — Encounter: Payer: Self-pay | Admitting: Cardiovascular Disease

## 2023-08-19 ENCOUNTER — Ambulatory Visit: Payer: Medicare Other | Admitting: Cardiovascular Disease

## 2023-08-19 VITALS — BP 122/84 | HR 85 | Ht 69.5 in | Wt 192.6 lb

## 2023-08-19 DIAGNOSIS — I428 Other cardiomyopathies: Secondary | ICD-10-CM | POA: Diagnosis not present

## 2023-08-19 DIAGNOSIS — I5022 Chronic systolic (congestive) heart failure: Secondary | ICD-10-CM | POA: Diagnosis not present

## 2023-08-19 DIAGNOSIS — I10 Essential (primary) hypertension: Secondary | ICD-10-CM

## 2023-08-19 DIAGNOSIS — I4821 Permanent atrial fibrillation: Secondary | ICD-10-CM

## 2023-08-19 DIAGNOSIS — I43 Cardiomyopathy in diseases classified elsewhere: Secondary | ICD-10-CM

## 2023-08-19 DIAGNOSIS — E854 Organ-limited amyloidosis: Secondary | ICD-10-CM | POA: Diagnosis not present

## 2023-08-19 NOTE — Patient Instructions (Signed)
Medication Instructions:  Take Lasix 40 mg twice a day for 3 more days then take 40 mg daily Call if continues to have swelling Continue all other medications *If you need a refill on your cardiac medications before your next appointment, please call your pharmacy*   Lab Work: Bmet today   Testing/Procedures: None ordered   Follow-Up: At [facility], you and your health needs are our priority.  As part of our continuing mission to provide you with exceptional heart care, we have created designated Provider Care Teams.  These Care Teams include your primary Cardiologist (physician) and Advanced Practice Providers (APPs -  Physician Assistants and Nurse Practitioners) who all work together to provide you with the care you need, when you need it.  We recommend signing up for the patient portal called "MyChart".  Sign up information is provided on this After Visit Summary.  MyChart is used to connect with patients for Virtual Visits (Telemedicine).  Patients are able to view lab/test results, encounter notes, upcoming appointments, etc.  Non-urgent messages can be sent to your provider as well.   To learn more about what you can do with MyChart, go to [email or web address].    Your next appointment:  3 to 4 months    Provider: Dr.ONeal

## 2023-08-20 LAB — BASIC METABOLIC PANEL
BUN/Creatinine Ratio: 21 (ref 10–24)
BUN: 34 mg/dL — ABNORMAL HIGH (ref 8–27)
CO2: 21 mmol/L (ref 20–29)
Calcium: 9.2 mg/dL (ref 8.6–10.2)
Chloride: 105 mmol/L (ref 96–106)
Creatinine, Ser: 1.62 mg/dL — ABNORMAL HIGH (ref 0.76–1.27)
Glucose: 107 mg/dL — ABNORMAL HIGH (ref 70–99)
Potassium: 4.5 mmol/L (ref 3.5–5.2)
Sodium: 141 mmol/L (ref 134–144)
eGFR: 44 mL/min/{1.73_m2} — ABNORMAL LOW (ref >59)

## 2023-09-09 ENCOUNTER — Emergency Department (HOSPITAL_COMMUNITY): Payer: Medicare Other

## 2023-09-09 ENCOUNTER — Other Ambulatory Visit: Payer: Self-pay

## 2023-09-09 ENCOUNTER — Encounter (HOSPITAL_COMMUNITY): Payer: Medicare Other

## 2023-09-09 ENCOUNTER — Inpatient Hospital Stay (HOSPITAL_COMMUNITY)
Admission: EM | Admit: 2023-09-09 | Discharge: 2023-09-27 | DRG: 208 | Disposition: E | Payer: Medicare Other | Attending: Pulmonary Disease | Admitting: Pulmonary Disease

## 2023-09-09 ENCOUNTER — Inpatient Hospital Stay (HOSPITAL_COMMUNITY): Payer: Medicare Other

## 2023-09-09 DIAGNOSIS — I4891 Unspecified atrial fibrillation: Secondary | ICD-10-CM | POA: Diagnosis present

## 2023-09-09 DIAGNOSIS — Y846 Urinary catheterization as the cause of abnormal reaction of the patient, or of later complication, without mention of misadventure at the time of the procedure: Secondary | ICD-10-CM | POA: Diagnosis not present

## 2023-09-09 DIAGNOSIS — I4892 Unspecified atrial flutter: Secondary | ICD-10-CM | POA: Diagnosis present

## 2023-09-09 DIAGNOSIS — I130 Hypertensive heart and chronic kidney disease with heart failure and stage 1 through stage 4 chronic kidney disease, or unspecified chronic kidney disease: Secondary | ICD-10-CM | POA: Diagnosis present

## 2023-09-09 DIAGNOSIS — Z8546 Personal history of malignant neoplasm of prostate: Secondary | ICD-10-CM

## 2023-09-09 DIAGNOSIS — D7389 Other diseases of spleen: Secondary | ICD-10-CM | POA: Diagnosis present

## 2023-09-09 DIAGNOSIS — K7460 Unspecified cirrhosis of liver: Secondary | ICD-10-CM | POA: Diagnosis present

## 2023-09-09 DIAGNOSIS — Z1152 Encounter for screening for COVID-19: Secondary | ICD-10-CM | POA: Diagnosis not present

## 2023-09-09 DIAGNOSIS — I43 Cardiomyopathy in diseases classified elsewhere: Secondary | ICD-10-CM | POA: Diagnosis present

## 2023-09-09 DIAGNOSIS — N1832 Chronic kidney disease, stage 3b: Secondary | ICD-10-CM | POA: Diagnosis present

## 2023-09-09 DIAGNOSIS — Z7901 Long term (current) use of anticoagulants: Secondary | ICD-10-CM

## 2023-09-09 DIAGNOSIS — Z800 Family history of malignant neoplasm of digestive organs: Secondary | ICD-10-CM

## 2023-09-09 DIAGNOSIS — G9341 Metabolic encephalopathy: Secondary | ICD-10-CM | POA: Diagnosis present

## 2023-09-09 DIAGNOSIS — J9601 Acute respiratory failure with hypoxia: Secondary | ICD-10-CM | POA: Diagnosis present

## 2023-09-09 DIAGNOSIS — R319 Hematuria, unspecified: Secondary | ICD-10-CM | POA: Diagnosis not present

## 2023-09-09 DIAGNOSIS — Z85038 Personal history of other malignant neoplasm of large intestine: Secondary | ICD-10-CM

## 2023-09-09 DIAGNOSIS — K649 Unspecified hemorrhoids: Secondary | ICD-10-CM | POA: Diagnosis present

## 2023-09-09 DIAGNOSIS — R4182 Altered mental status, unspecified: Principal | ICD-10-CM

## 2023-09-09 DIAGNOSIS — Z515 Encounter for palliative care: Secondary | ICD-10-CM | POA: Diagnosis not present

## 2023-09-09 DIAGNOSIS — Z86718 Personal history of other venous thrombosis and embolism: Secondary | ICD-10-CM | POA: Diagnosis not present

## 2023-09-09 DIAGNOSIS — Z66 Do not resuscitate: Secondary | ICD-10-CM | POA: Diagnosis not present

## 2023-09-09 DIAGNOSIS — I422 Other hypertrophic cardiomyopathy: Secondary | ICD-10-CM | POA: Diagnosis present

## 2023-09-09 DIAGNOSIS — R0603 Acute respiratory distress: Secondary | ICD-10-CM

## 2023-09-09 DIAGNOSIS — J690 Pneumonitis due to inhalation of food and vomit: Principal | ICD-10-CM | POA: Diagnosis present

## 2023-09-09 DIAGNOSIS — T8383XA Hemorrhage of genitourinary prosthetic devices, implants and grafts, initial encounter: Secondary | ICD-10-CM | POA: Diagnosis not present

## 2023-09-09 DIAGNOSIS — E854 Organ-limited amyloidosis: Secondary | ICD-10-CM | POA: Diagnosis present

## 2023-09-09 DIAGNOSIS — I462 Cardiac arrest due to underlying cardiac condition: Secondary | ICD-10-CM | POA: Diagnosis not present

## 2023-09-09 DIAGNOSIS — R339 Retention of urine, unspecified: Secondary | ICD-10-CM | POA: Diagnosis present

## 2023-09-09 DIAGNOSIS — Z8719 Personal history of other diseases of the digestive system: Secondary | ICD-10-CM

## 2023-09-09 DIAGNOSIS — E872 Acidosis, unspecified: Secondary | ICD-10-CM | POA: Diagnosis present

## 2023-09-09 DIAGNOSIS — N139 Obstructive and reflux uropathy, unspecified: Secondary | ICD-10-CM | POA: Diagnosis present

## 2023-09-09 DIAGNOSIS — R569 Unspecified convulsions: Secondary | ICD-10-CM | POA: Diagnosis not present

## 2023-09-09 DIAGNOSIS — N320 Bladder-neck obstruction: Secondary | ICD-10-CM | POA: Diagnosis present

## 2023-09-09 DIAGNOSIS — I5022 Chronic systolic (congestive) heart failure: Secondary | ICD-10-CM | POA: Diagnosis present

## 2023-09-09 DIAGNOSIS — Z79899 Other long term (current) drug therapy: Secondary | ICD-10-CM

## 2023-09-09 DIAGNOSIS — B1920 Unspecified viral hepatitis C without hepatic coma: Secondary | ICD-10-CM | POA: Diagnosis present

## 2023-09-09 DIAGNOSIS — Z823 Family history of stroke: Secondary | ICD-10-CM

## 2023-09-09 DIAGNOSIS — Z808 Family history of malignant neoplasm of other organs or systems: Secondary | ICD-10-CM

## 2023-09-09 DIAGNOSIS — M109 Gout, unspecified: Secondary | ICD-10-CM | POA: Diagnosis present

## 2023-09-09 LAB — I-STAT CHEM 8, ED
BUN: 35 mg/dL — ABNORMAL HIGH (ref 8–23)
Calcium, Ion: 1.12 mmol/L — ABNORMAL LOW (ref 1.15–1.40)
Chloride: 111 mmol/L (ref 98–111)
Creatinine, Ser: 1.7 mg/dL — ABNORMAL HIGH (ref 0.61–1.24)
Glucose, Bld: 87 mg/dL (ref 70–99)
HCT: 48 % (ref 39.0–52.0)
Hemoglobin: 16.3 g/dL (ref 13.0–17.0)
Potassium: 4.4 mmol/L (ref 3.5–5.1)
Sodium: 142 mmol/L (ref 135–145)
TCO2: 19 mmol/L — ABNORMAL LOW (ref 22–32)

## 2023-09-09 LAB — DIFFERENTIAL
Abs Immature Granulocytes: 0.03 10*3/uL (ref 0.00–0.07)
Basophils Absolute: 0 10*3/uL (ref 0.0–0.1)
Basophils Relative: 1 %
Eosinophils Absolute: 0.1 10*3/uL (ref 0.0–0.5)
Eosinophils Relative: 1 %
Immature Granulocytes: 1 %
Lymphocytes Relative: 17 %
Lymphs Abs: 0.9 10*3/uL (ref 0.7–4.0)
Monocytes Absolute: 0.7 10*3/uL (ref 0.1–1.0)
Monocytes Relative: 14 %
Neutro Abs: 3.4 10*3/uL (ref 1.7–7.7)
Neutrophils Relative %: 66 %

## 2023-09-09 LAB — GLUCOSE, CAPILLARY: Glucose-Capillary: 114 mg/dL — ABNORMAL HIGH (ref 70–99)

## 2023-09-09 LAB — I-STAT ARTERIAL BLOOD GAS, ED
Acid-base deficit: 8 mmol/L — ABNORMAL HIGH (ref 0.0–2.0)
Bicarbonate: 17.3 mmol/L — ABNORMAL LOW (ref 20.0–28.0)
Calcium, Ion: 1.21 mmol/L (ref 1.15–1.40)
HCT: 39 % (ref 39.0–52.0)
Hemoglobin: 13.3 g/dL (ref 13.0–17.0)
O2 Saturation: 99 %
Potassium: 3.6 mmol/L (ref 3.5–5.1)
Sodium: 142 mmol/L (ref 135–145)
TCO2: 18 mmol/L — ABNORMAL LOW (ref 22–32)
pCO2 arterial: 32.8 mmHg (ref 32–48)
pH, Arterial: 7.331 — ABNORMAL LOW (ref 7.35–7.45)
pO2, Arterial: 138 mmHg — ABNORMAL HIGH (ref 83–108)

## 2023-09-09 LAB — RESPIRATORY PANEL BY PCR

## 2023-09-09 LAB — CBC
HCT: 48.1 % (ref 39.0–52.0)
Hemoglobin: 14.7 g/dL (ref 13.0–17.0)
MCH: 27.7 pg (ref 26.0–34.0)
MCHC: 30.6 g/dL (ref 30.0–36.0)
MCV: 90.8 fL (ref 80.0–100.0)
Platelets: 145 10*3/uL — ABNORMAL LOW (ref 150–400)
RBC: 5.3 MIL/uL (ref 4.22–5.81)
RDW: 19.9 % — ABNORMAL HIGH (ref 11.5–15.5)
WBC: 5.1 10*3/uL (ref 4.0–10.5)
nRBC: 0 % (ref 0.0–0.2)

## 2023-09-09 LAB — APTT: aPTT: 27 s (ref 24–36)

## 2023-09-09 LAB — I-STAT CG4 LACTIC ACID, ED: Lactic Acid, Venous: 3.1 mmol/L (ref 0.5–1.9)

## 2023-09-09 LAB — CBG MONITORING, ED
Glucose-Capillary: 74 mg/dL (ref 70–99)
Glucose-Capillary: 90 mg/dL (ref 70–99)

## 2023-09-09 LAB — COMPREHENSIVE METABOLIC PANEL
ALT: 23 U/L (ref 0–44)
AST: 34 U/L (ref 15–41)
Albumin: 2.9 g/dL — ABNORMAL LOW (ref 3.5–5.0)
Alkaline Phosphatase: 112 U/L (ref 38–126)
Anion gap: 11 (ref 5–15)
BUN: 28 mg/dL — ABNORMAL HIGH (ref 8–23)
CO2: 18 mmol/L — ABNORMAL LOW (ref 22–32)
Calcium: 8.9 mg/dL (ref 8.9–10.3)
Chloride: 108 mmol/L (ref 98–111)
Creatinine, Ser: 1.81 mg/dL — ABNORMAL HIGH (ref 0.61–1.24)
GFR, Estimated: 38 mL/min — ABNORMAL LOW (ref >60)
Glucose, Bld: 83 mg/dL (ref 70–99)
Potassium: 4.4 mmol/L (ref 3.5–5.1)
Sodium: 137 mmol/L (ref 135–145)
Total Bilirubin: 2.6 mg/dL — ABNORMAL HIGH (ref 0.3–1.2)
Total Protein: 7.7 g/dL (ref 6.5–8.1)

## 2023-09-09 LAB — PROTIME-INR
INR: 1.4 — ABNORMAL HIGH (ref 0.8–1.2)
Prothrombin Time: 17.4 s — ABNORMAL HIGH (ref 11.4–15.2)

## 2023-09-09 LAB — TROPONIN I (HIGH SENSITIVITY)
Troponin I (High Sensitivity): 47 ng/L — ABNORMAL HIGH (ref <18)
Troponin I (High Sensitivity): 50 ng/L — ABNORMAL HIGH (ref <18)

## 2023-09-09 LAB — AMMONIA: Ammonia: 38 umol/L — ABNORMAL HIGH (ref 9–35)

## 2023-09-09 LAB — BRAIN NATRIURETIC PEPTIDE: B Natriuretic Peptide: 2523.7 pg/mL — ABNORMAL HIGH (ref 0.0–100.0)

## 2023-09-09 LAB — CK: Total CK: 98 U/L (ref 49–397)

## 2023-09-09 LAB — PROCALCITONIN: Procalcitonin: <0.1 ng/mL

## 2023-09-09 LAB — LACTIC ACID, PLASMA: Lactic Acid, Venous: 1.6 mmol/L (ref 0.5–1.9)

## 2023-09-09 LAB — MRSA NEXT GEN BY PCR, NASAL: MRSA by PCR Next Gen: NOT DETECTED

## 2023-09-09 LAB — SARS CORONAVIRUS 2 BY RT PCR: SARS Coronavirus 2 by RT PCR: NEGATIVE

## 2023-09-09 MED ORDER — PROPOFOL 1000 MG/100ML IV EMUL
0.0000 ug/kg/min | INTRAVENOUS | Status: DC
  Administered 2023-09-09: 30 ug/kg/min via INTRAVENOUS
  Filled 2023-09-09 (×3): qty 100

## 2023-09-09 MED ORDER — IOHEXOL 350 MG/ML SOLN
75.0000 mL | Freq: Once | INTRAVENOUS | Status: AC | PRN
  Administered 2023-09-09: 75 mL via INTRAVENOUS

## 2023-09-09 MED ORDER — LACTATED RINGERS IV SOLN: INTRAVENOUS | Status: DC

## 2023-09-09 MED ORDER — HEPARIN (PORCINE) 25000 UT/250ML-% IV SOLN
1200.0000 [IU]/h | INTRAVENOUS | Status: DC
  Administered 2023-09-09: 1200 [IU]/h via INTRAVENOUS
  Filled 2023-09-09: qty 250

## 2023-09-09 MED ORDER — ACETAMINOPHEN 325 MG PO TABS: 650.0000 mg | ORAL_TABLET | ORAL | Status: DC | PRN

## 2023-09-09 MED ORDER — ONDANSETRON HCL 4 MG/2ML IJ SOLN: 4.0000 mg | Freq: Four times a day (QID) | INTRAMUSCULAR | Status: DC | PRN

## 2023-09-09 MED ORDER — POLYETHYLENE GLYCOL 3350 17 G PO PACK
17.0000 g | PACK | Freq: Every day | ORAL | Status: DC
  Administered 2023-09-10: 17 g
  Filled 2023-09-09: qty 1

## 2023-09-09 MED ORDER — DOCUSATE SODIUM 100 MG PO CAPS: 100.0000 mg | ORAL_CAPSULE | Freq: Two times a day (BID) | ORAL | Status: DC | PRN

## 2023-09-09 MED ORDER — LIDOCAINE HCL URETHRAL/MUCOSAL 2 % EX GEL
1.0000 | Freq: Once | CUTANEOUS | Status: DC
  Filled 2023-09-09: qty 6

## 2023-09-09 MED ORDER — DOCUSATE SODIUM 50 MG/5ML PO LIQD
100.0000 mg | Freq: Two times a day (BID) | ORAL | Status: DC
  Administered 2023-09-10: 100 mg
  Filled 2023-09-09: qty 10

## 2023-09-09 MED ORDER — INSULIN ASPART 100 UNIT/ML IJ SOLN: 0.0000 [IU] | INTRAMUSCULAR | Status: DC

## 2023-09-09 MED ORDER — DOCUSATE SODIUM 50 MG/5ML PO LIQD: 100.0000 mg | Freq: Two times a day (BID) | ORAL | Status: DC | PRN

## 2023-09-09 MED ORDER — PROPOFOL 10 MG/ML IV BOLUS
INTRAVENOUS | Status: DC | PRN
Start: 2023-09-09 — End: 2023-09-09
  Administered 2023-09-09 (×2): 20 ug via INTRAVENOUS

## 2023-09-09 MED ORDER — SODIUM CHLORIDE 0.9 % IV SOLN
INTRAVENOUS | Status: AC | PRN
Start: 2023-09-09 — End: 2023-09-09
  Administered 2023-09-09: 1000 mL via INTRAVENOUS

## 2023-09-09 MED ORDER — ORAL CARE MOUTH RINSE: 15.0000 mL | OROMUCOSAL | Status: DC | PRN

## 2023-09-09 MED ORDER — CHLORHEXIDINE GLUCONATE CLOTH 2 % EX PADS
6.0000 | MEDICATED_PAD | Freq: Every day | CUTANEOUS | Status: DC
  Administered 2023-09-09: 6 via TOPICAL

## 2023-09-09 MED ORDER — FENTANYL CITRATE PF 50 MCG/ML IJ SOSY
25.0000 ug | PREFILLED_SYRINGE | INTRAMUSCULAR | Status: DC | PRN
  Administered 2023-09-10 (×4): 100 ug via INTRAVENOUS
  Filled 2023-09-09 (×4): qty 2

## 2023-09-09 MED ORDER — ETOMIDATE 2 MG/ML IV SOLN
INTRAVENOUS | Status: DC | PRN
Start: 2023-09-09 — End: 2023-09-09
  Administered 2023-09-09 (×2): 20 mg via INTRAVENOUS

## 2023-09-09 MED ORDER — ROCURONIUM BROMIDE 50 MG/5ML IV SOLN
INTRAVENOUS | Status: DC | PRN
Start: 2023-09-09 — End: 2023-09-09
  Administered 2023-09-09: 100 mg via INTRAVENOUS

## 2023-09-09 MED ORDER — PANTOPRAZOLE SODIUM 40 MG IV SOLR
40.0000 mg | Freq: Every day | INTRAVENOUS | Status: DC
  Administered 2023-09-09: 40 mg via INTRAVENOUS
  Filled 2023-09-09: qty 10

## 2023-09-09 MED ORDER — ORAL CARE MOUTH RINSE
15.0000 mL | OROMUCOSAL | Status: DC
  Administered 2023-09-09 – 2023-09-10 (×8): 15 mL via OROMUCOSAL

## 2023-09-09 MED ORDER — IPRATROPIUM-ALBUTEROL 0.5-2.5 (3) MG/3ML IN SOLN: 3.0000 mL | RESPIRATORY_TRACT | Status: DC | PRN

## 2023-09-09 MED ORDER — PROPOFOL 1000 MG/100ML IV EMUL
5.0000 ug/kg/min | INTRAVENOUS | Status: DC
  Administered 2023-09-09: 30 ug/kg/min via INTRAVENOUS

## 2023-09-09 MED ORDER — SODIUM CHLORIDE 0.9 % IV SOLN
3.0000 g | Freq: Three times a day (TID) | INTRAVENOUS | Status: DC
  Administered 2023-09-09 – 2023-09-10 (×3): 3 g via INTRAVENOUS
  Filled 2023-09-09 (×3): qty 8

## 2023-09-09 MED ORDER — POLYETHYLENE GLYCOL 3350 17 G PO PACK: 17.0000 g | PACK | Freq: Every day | ORAL | Status: DC | PRN

## 2023-09-09 MED ORDER — FENTANYL CITRATE PF 50 MCG/ML IJ SOSY: 25.0000 ug | PREFILLED_SYRINGE | INTRAMUSCULAR | Status: DC | PRN

## 2023-09-09 NOTE — Progress Notes (Signed)
eLink Physician-Brief Progress Note Patient Name: Jordan [name]. DOB: 1946/04/10 MRN: [id number]   Date of Service  09/02/2023  HPI/Events of Note  Patient admitted with altered mental status and acute respiratory failure requiring intubation and mechanical ventilation, chest imaging is suggestive of multi-focal pneumonia.  eICU Interventions  New Patient Evaluation.        [name] 09/06/2023, 10:06 PM

## 2023-09-09 NOTE — ED Provider Notes (Signed)
[place] [facility] Provider Note  CSN: [id number] Arrival date & time: 09/26/2023 1554  Chief Complaint(s) Unresponsive and Respiratory Distress  HPI Jordan [name]. is a 77 y.o. male with past medical history as below, significant for afib on DOAC, diverticulosis, CHF, cardiomyopathy, NSVT, CKD who presents to the ED with complaint of ams.   Pt lives alone, [employer] was called out for wellness check and pt found down at his apartment, minimally responsive.  On EMS arrival GCS was 3, he was requiring bagging due to poor respiratory effort.  Appeared to have left-sided gaze deviation and aphasia.  Last known well over 2 days ago per EMS, no family at bedside, does not meet stroke activation criteria.  On arrival patient with intermittent agonal breathing, AMS, intubated for airway protection  Past Medical History Past Medical History:  Diagnosis Date   Atrial fib/flutter, transient (HCC)    Hx   Atrial fibrillation (HCC)    coumadin   Colon cancer (HCC)    family hx   Diverticulosis    Gout    Hemorrhoids    Hepatitis C    History of DVT (deep vein thrombosis)    History of nuclear stress test 04/2002   exercise; normal study    Hypertension    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HCC)    history of    Personal history of colonic polyps    Prostate cancer [facility])    radical prostatectomy    Patient Active Problem List   Diagnosis Date Noted   Acute metabolic encephalopathy 09/13/2023   Hypotension 04/28/2022   NSVT (nonsustained ventricular tachycardia) (HCC) 04/12/2022   Stage 3a chronic kidney disease (HCC)    Chronic hepatitis C without hepatic coma (HCC)    History of gout    Substance abuse (HCC)    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 Authorizing Provider  allopurinol (ZYLOPRIM) 100 MG tablet Take 1 tablet (100 mg total) by mouth daily. 05/18/21  Yes Angiulli, Mcarthur Rossetti, PA-C  apixaban (ELIQUIS) 5 MG TABS tablet Take 1 tablet (5 mg total) by mouth 2 (two) times daily. 05/18/21  Yes Angiulli, Mcarthur Rossetti, PA-C  atorvastatin (LIPITOR) 40 MG tablet Take 1 tablet (40 mg total) by mouth daily. 05/18/21  Yes Angiulli, Mcarthur Rossetti, PA-C  carvedilol (COREG) 25 MG tablet Take 25 mg by mouth 2 (two) times daily. 12/01/22  Yes [provider]  Eplontersen Sodium (WAINUA) 45 MG/0.8ML SOAJ Inject 45 mg into the skin every 30 (thirty) days. 03/03/23  Yes O'Neal, Ronnald Ramp, MD  FARXIGA 10 MG TABS tablet Take 10 mg by mouth daily. 03/13/22  Yes [provider]  furosemide (LASIX) 40 MG tablet Take 1 tablet (40 mg total) by mouth 2 (two) times daily. 07/07/23  Yes BranchAlben Spittle, MD  hydrALAZINE (APRESOLINE) 100 MG tablet TAKE 1 TABLET(100 MG) BY MOUTH THREE TIMES DAILY Patient taking differently: Take 100 mg by mouth 3 (three) times daily. 05/30/23  Yes  [place] [facility] Provider Note  CSN: [id number] Arrival date & time: 09/26/2023 1554  Chief Complaint(s) Unresponsive and Respiratory Distress  HPI Jordan [name]. is a 77 y.o. male with past medical history as below, significant for afib on DOAC, diverticulosis, CHF, cardiomyopathy, NSVT, CKD who presents to the ED with complaint of ams.   Pt lives alone, [employer] was called out for wellness check and pt found down at his apartment, minimally responsive.  On EMS arrival GCS was 3, he was requiring bagging due to poor respiratory effort.  Appeared to have left-sided gaze deviation and aphasia.  Last known well over 2 days ago per EMS, no family at bedside, does not meet stroke activation criteria.  On arrival patient with intermittent agonal breathing, AMS, intubated for airway protection  Past Medical History Past Medical History:  Diagnosis Date   Atrial fib/flutter, transient (HCC)    Hx   Atrial fibrillation (HCC)    coumadin   Colon cancer (HCC)    family hx   Diverticulosis    Gout    Hemorrhoids    Hepatitis C    History of DVT (deep vein thrombosis)    History of nuclear stress test 04/2002   exercise; normal study    Hypertension    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HCC)    history of    Personal history of colonic polyps    Prostate cancer [facility])    radical prostatectomy    Patient Active Problem List   Diagnosis Date Noted   Acute metabolic encephalopathy 09/13/2023   Hypotension 04/28/2022   NSVT (nonsustained ventricular tachycardia) (HCC) 04/12/2022   Stage 3a chronic kidney disease (HCC)    Chronic hepatitis C without hepatic coma (HCC)    History of gout    Substance abuse (HCC)    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 Authorizing Provider  allopurinol (ZYLOPRIM) 100 MG tablet Take 1 tablet (100 mg total) by mouth daily. 05/18/21  Yes Angiulli, Mcarthur Rossetti, PA-C  apixaban (ELIQUIS) 5 MG TABS tablet Take 1 tablet (5 mg total) by mouth 2 (two) times daily. 05/18/21  Yes Angiulli, Mcarthur Rossetti, PA-C  atorvastatin (LIPITOR) 40 MG tablet Take 1 tablet (40 mg total) by mouth daily. 05/18/21  Yes Angiulli, Mcarthur Rossetti, PA-C  carvedilol (COREG) 25 MG tablet Take 25 mg by mouth 2 (two) times daily. 12/01/22  Yes [provider]  Eplontersen Sodium (WAINUA) 45 MG/0.8ML SOAJ Inject 45 mg into the skin every 30 (thirty) days. 03/03/23  Yes O'Neal, Ronnald Ramp, MD  FARXIGA 10 MG TABS tablet Take 10 mg by mouth daily. 03/13/22  Yes [provider]  furosemide (LASIX) 40 MG tablet Take 1 tablet (40 mg total) by mouth 2 (two) times daily. 07/07/23  Yes BranchAlben Spittle, MD  hydrALAZINE (APRESOLINE) 100 MG tablet TAKE 1 TABLET(100 MG) BY MOUTH THREE TIMES DAILY Patient taking differently: Take 100 mg by mouth 3 (three) times daily. 05/30/23  Yes  [place] [facility] Provider Note  CSN: [id number] Arrival date & time: 09/26/2023 1554  Chief Complaint(s) Unresponsive and Respiratory Distress  HPI Jordan [name]. is a 77 y.o. male with past medical history as below, significant for afib on DOAC, diverticulosis, CHF, cardiomyopathy, NSVT, CKD who presents to the ED with complaint of ams.   Pt lives alone, [employer] was called out for wellness check and pt found down at his apartment, minimally responsive.  On EMS arrival GCS was 3, he was requiring bagging due to poor respiratory effort.  Appeared to have left-sided gaze deviation and aphasia.  Last known well over 2 days ago per EMS, no family at bedside, does not meet stroke activation criteria.  On arrival patient with intermittent agonal breathing, AMS, intubated for airway protection  Past Medical History Past Medical History:  Diagnosis Date   Atrial fib/flutter, transient (HCC)    Hx   Atrial fibrillation (HCC)    coumadin   Colon cancer (HCC)    family hx   Diverticulosis    Gout    Hemorrhoids    Hepatitis C    History of DVT (deep vein thrombosis)    History of nuclear stress test 04/2002   exercise; normal study    Hypertension    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HCC)    history of    Personal history of colonic polyps    Prostate cancer [facility])    radical prostatectomy    Patient Active Problem List   Diagnosis Date Noted   Acute metabolic encephalopathy 09/13/2023   Hypotension 04/28/2022   NSVT (nonsustained ventricular tachycardia) (HCC) 04/12/2022   Stage 3a chronic kidney disease (HCC)    Chronic hepatitis C without hepatic coma (HCC)    History of gout    Substance abuse (HCC)    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 Authorizing Provider  allopurinol (ZYLOPRIM) 100 MG tablet Take 1 tablet (100 mg total) by mouth daily. 05/18/21  Yes Angiulli, Mcarthur Rossetti, PA-C  apixaban (ELIQUIS) 5 MG TABS tablet Take 1 tablet (5 mg total) by mouth 2 (two) times daily. 05/18/21  Yes Angiulli, Mcarthur Rossetti, PA-C  atorvastatin (LIPITOR) 40 MG tablet Take 1 tablet (40 mg total) by mouth daily. 05/18/21  Yes Angiulli, Mcarthur Rossetti, PA-C  carvedilol (COREG) 25 MG tablet Take 25 mg by mouth 2 (two) times daily. 12/01/22  Yes [provider]  Eplontersen Sodium (WAINUA) 45 MG/0.8ML SOAJ Inject 45 mg into the skin every 30 (thirty) days. 03/03/23  Yes O'Neal, Ronnald Ramp, MD  FARXIGA 10 MG TABS tablet Take 10 mg by mouth daily. 03/13/22  Yes [provider]  furosemide (LASIX) 40 MG tablet Take 1 tablet (40 mg total) by mouth 2 (two) times daily. 07/07/23  Yes BranchAlben Spittle, MD  hydrALAZINE (APRESOLINE) 100 MG tablet TAKE 1 TABLET(100 MG) BY MOUTH THREE TIMES DAILY Patient taking differently: Take 100 mg by mouth 3 (three) times daily. 05/30/23  Yes  [place] [facility] Provider Note  CSN: [id number] Arrival date & time: 09/26/2023 1554  Chief Complaint(s) Unresponsive and Respiratory Distress  HPI Jordan [name]. is a 77 y.o. male with past medical history as below, significant for afib on DOAC, diverticulosis, CHF, cardiomyopathy, NSVT, CKD who presents to the ED with complaint of ams.   Pt lives alone, [employer] was called out for wellness check and pt found down at his apartment, minimally responsive.  On EMS arrival GCS was 3, he was requiring bagging due to poor respiratory effort.  Appeared to have left-sided gaze deviation and aphasia.  Last known well over 2 days ago per EMS, no family at bedside, does not meet stroke activation criteria.  On arrival patient with intermittent agonal breathing, AMS, intubated for airway protection  Past Medical History Past Medical History:  Diagnosis Date   Atrial fib/flutter, transient (HCC)    Hx   Atrial fibrillation (HCC)    coumadin   Colon cancer (HCC)    family hx   Diverticulosis    Gout    Hemorrhoids    Hepatitis C    History of DVT (deep vein thrombosis)    History of nuclear stress test 04/2002   exercise; normal study    Hypertension    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HCC)    history of    Personal history of colonic polyps    Prostate cancer [facility])    radical prostatectomy    Patient Active Problem List   Diagnosis Date Noted   Acute metabolic encephalopathy 09/13/2023   Hypotension 04/28/2022   NSVT (nonsustained ventricular tachycardia) (HCC) 04/12/2022   Stage 3a chronic kidney disease (HCC)    Chronic hepatitis C without hepatic coma (HCC)    History of gout    Substance abuse (HCC)    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 Authorizing Provider  allopurinol (ZYLOPRIM) 100 MG tablet Take 1 tablet (100 mg total) by mouth daily. 05/18/21  Yes Angiulli, Mcarthur Rossetti, PA-C  apixaban (ELIQUIS) 5 MG TABS tablet Take 1 tablet (5 mg total) by mouth 2 (two) times daily. 05/18/21  Yes Angiulli, Mcarthur Rossetti, PA-C  atorvastatin (LIPITOR) 40 MG tablet Take 1 tablet (40 mg total) by mouth daily. 05/18/21  Yes Angiulli, Mcarthur Rossetti, PA-C  carvedilol (COREG) 25 MG tablet Take 25 mg by mouth 2 (two) times daily. 12/01/22  Yes [provider]  Eplontersen Sodium (WAINUA) 45 MG/0.8ML SOAJ Inject 45 mg into the skin every 30 (thirty) days. 03/03/23  Yes O'Neal, Ronnald Ramp, MD  FARXIGA 10 MG TABS tablet Take 10 mg by mouth daily. 03/13/22  Yes [provider]  furosemide (LASIX) 40 MG tablet Take 1 tablet (40 mg total) by mouth 2 (two) times daily. 07/07/23  Yes BranchAlben Spittle, MD  hydrALAZINE (APRESOLINE) 100 MG tablet TAKE 1 TABLET(100 MG) BY MOUTH THREE TIMES DAILY Patient taking differently: Take 100 mg by mouth 3 (three) times daily. 05/30/23  Yes  Procedures .Critical Care  Performed by: Jordan Leiter, DO Authorized by: Jordan Leiter, DO   Critical care provider statement:    Critical care time (minutes):  65   Critical care time was exclusive of:  Separately billable procedures and treating other patients   Critical care was necessary to treat or prevent imminent or life-threatening deterioration of the following conditions:  Respiratory failure   Critical care was time spent personally by me on the following activities:  Development of treatment plan with patient or surrogate, discussions with consultants, evaluation of patient's response to treatment, examination of patient, ordering and review of laboratory studies, ordering and review of radiographic studies, ordering and performing treatments and interventions, pulse oximetry, re-evaluation of patient's condition, review of old charts and obtaining history from patient or surrogate   Care discussed with: admitting provider   Procedure Name: Intubation Date/Time: 08/30/2023 11:56 PM  Performed by: Jordan [name], DOPre-anesthesia Checklist: Patient identified, Patient being monitored, Emergency Drugs available, Timeout performed and Suction available Oxygen Delivery Method: Non-rebreather mask Preoxygenation: Pre-oxygenation with 100% oxygen Induction Type: Rapid sequence Ventilation: Mask ventilation without difficulty Tube size: 7.5 mm Number of attempts: 1 Placement Confirmation: ETT inserted through vocal cords under direct vision, CO2 detector and Breath sounds checked- equal and bilateral Secured at: 24 cm Tube secured with: ETT holder Dental Injury: Teeth and Oropharynx as per pre-operative assessment  Difficulty Due To: Difficulty was anticipated      (including  critical care time)  Medical Decision Making / ED Course    Medical Decision Making:    Jordan [name]. is a 77 y.o. male with past medical history as below, significant for afib on DOAC, diverticulosis, CHF, cardiomyopathy, NSVT, CKD who presents to the ED with complaint of ams. . The complaint involves an extensive differential diagnosis and also carries with it a high risk of complications and morbidity.  Serious etiology was considered. Ddx includes but is not limited to: Differential diagnoses for altered mental status includes but is not exclusive to alcohol, illicit or prescription medications, intracranial pathology such as stroke, intracerebral hemorrhage, fever or infectious causes including sepsis, hypoxemia, uremia, trauma, endocrine related disorders such as diabetes, hypoglycemia, thyroid-related diseases, etc.   Complete initial physical exam performed, notably the patient  was in acute distress on arrival, agonal breathing, intermittently responsive.    Reviewed and confirmed nursing documentation for past medical history, family history, social history.  Vital signs reviewed.    Clinical Course as of 08/31/2023 2356  Fri Sep 09, 2023  1629 ETT in appropriate position on wet read [SG]  1629 LKN 2 days ago at least per EMS. Will activate code medical. Spoke with neuro at bedside  [SG]  [phone] Spoke with friend at bedside, given updated history, reports she was talking to him on the phone around 2 PM today, he is speaking normally.  During her conversation she could hear him having difficulty breathing and he became unresponsive and the call was disconnected.  She called the apartment complex and [employer] and requested they assessed the patient in his residence.  She denies any known history of illicit drug use, chronic alcohol abuse.  She is unsure of his medical history [SG]  1829 Creatinine(!): 1.81 Similar to prior [SG]  1829 Per chart review hx cardiomyopathy, Amyloidosis, A-fib  on eliquis, CHF,CKD3 [SG]    Clinical Course User Index [SG] Jordan Leiter, DO     Patient intubated on arrival for airway protection  Discussed  Procedures .Critical Care  Performed by: Jordan Leiter, DO Authorized by: Jordan Leiter, DO   Critical care provider statement:    Critical care time (minutes):  65   Critical care time was exclusive of:  Separately billable procedures and treating other patients   Critical care was necessary to treat or prevent imminent or life-threatening deterioration of the following conditions:  Respiratory failure   Critical care was time spent personally by me on the following activities:  Development of treatment plan with patient or surrogate, discussions with consultants, evaluation of patient's response to treatment, examination of patient, ordering and review of laboratory studies, ordering and review of radiographic studies, ordering and performing treatments and interventions, pulse oximetry, re-evaluation of patient's condition, review of old charts and obtaining history from patient or surrogate   Care discussed with: admitting provider   Procedure Name: Intubation Date/Time: 08/30/2023 11:56 PM  Performed by: Jordan [name], DOPre-anesthesia Checklist: Patient identified, Patient being monitored, Emergency Drugs available, Timeout performed and Suction available Oxygen Delivery Method: Non-rebreather mask Preoxygenation: Pre-oxygenation with 100% oxygen Induction Type: Rapid sequence Ventilation: Mask ventilation without difficulty Tube size: 7.5 mm Number of attempts: 1 Placement Confirmation: ETT inserted through vocal cords under direct vision, CO2 detector and Breath sounds checked- equal and bilateral Secured at: 24 cm Tube secured with: ETT holder Dental Injury: Teeth and Oropharynx as per pre-operative assessment  Difficulty Due To: Difficulty was anticipated      (including  critical care time)  Medical Decision Making / ED Course    Medical Decision Making:    Jordan [name]. is a 77 y.o. male with past medical history as below, significant for afib on DOAC, diverticulosis, CHF, cardiomyopathy, NSVT, CKD who presents to the ED with complaint of ams. . The complaint involves an extensive differential diagnosis and also carries with it a high risk of complications and morbidity.  Serious etiology was considered. Ddx includes but is not limited to: Differential diagnoses for altered mental status includes but is not exclusive to alcohol, illicit or prescription medications, intracranial pathology such as stroke, intracerebral hemorrhage, fever or infectious causes including sepsis, hypoxemia, uremia, trauma, endocrine related disorders such as diabetes, hypoglycemia, thyroid-related diseases, etc.   Complete initial physical exam performed, notably the patient  was in acute distress on arrival, agonal breathing, intermittently responsive.    Reviewed and confirmed nursing documentation for past medical history, family history, social history.  Vital signs reviewed.    Clinical Course as of 08/31/2023 2356  Fri Sep 09, 2023  1629 ETT in appropriate position on wet read [SG]  1629 LKN 2 days ago at least per EMS. Will activate code medical. Spoke with neuro at bedside  [SG]  [phone] Spoke with friend at bedside, given updated history, reports she was talking to him on the phone around 2 PM today, he is speaking normally.  During her conversation she could hear him having difficulty breathing and he became unresponsive and the call was disconnected.  She called the apartment complex and [employer] and requested they assessed the patient in his residence.  She denies any known history of illicit drug use, chronic alcohol abuse.  She is unsure of his medical history [SG]  1829 Creatinine(!): 1.81 Similar to prior [SG]  1829 Per chart review hx cardiomyopathy, Amyloidosis, A-fib  on eliquis, CHF,CKD3 [SG]    Clinical Course User Index [SG] Jordan Leiter, DO     Patient intubated on arrival for airway protection  Discussed  Procedures .Critical Care  Performed by: Jordan Leiter, DO Authorized by: Jordan Leiter, DO   Critical care provider statement:    Critical care time (minutes):  65   Critical care time was exclusive of:  Separately billable procedures and treating other patients   Critical care was necessary to treat or prevent imminent or life-threatening deterioration of the following conditions:  Respiratory failure   Critical care was time spent personally by me on the following activities:  Development of treatment plan with patient or surrogate, discussions with consultants, evaluation of patient's response to treatment, examination of patient, ordering and review of laboratory studies, ordering and review of radiographic studies, ordering and performing treatments and interventions, pulse oximetry, re-evaluation of patient's condition, review of old charts and obtaining history from patient or surrogate   Care discussed with: admitting provider   Procedure Name: Intubation Date/Time: 08/30/2023 11:56 PM  Performed by: Jordan [name], DOPre-anesthesia Checklist: Patient identified, Patient being monitored, Emergency Drugs available, Timeout performed and Suction available Oxygen Delivery Method: Non-rebreather mask Preoxygenation: Pre-oxygenation with 100% oxygen Induction Type: Rapid sequence Ventilation: Mask ventilation without difficulty Tube size: 7.5 mm Number of attempts: 1 Placement Confirmation: ETT inserted through vocal cords under direct vision, CO2 detector and Breath sounds checked- equal and bilateral Secured at: 24 cm Tube secured with: ETT holder Dental Injury: Teeth and Oropharynx as per pre-operative assessment  Difficulty Due To: Difficulty was anticipated      (including  critical care time)  Medical Decision Making / ED Course    Medical Decision Making:    Jordan [name]. is a 77 y.o. male with past medical history as below, significant for afib on DOAC, diverticulosis, CHF, cardiomyopathy, NSVT, CKD who presents to the ED with complaint of ams. . The complaint involves an extensive differential diagnosis and also carries with it a high risk of complications and morbidity.  Serious etiology was considered. Ddx includes but is not limited to: Differential diagnoses for altered mental status includes but is not exclusive to alcohol, illicit or prescription medications, intracranial pathology such as stroke, intracerebral hemorrhage, fever or infectious causes including sepsis, hypoxemia, uremia, trauma, endocrine related disorders such as diabetes, hypoglycemia, thyroid-related diseases, etc.   Complete initial physical exam performed, notably the patient  was in acute distress on arrival, agonal breathing, intermittently responsive.    Reviewed and confirmed nursing documentation for past medical history, family history, social history.  Vital signs reviewed.    Clinical Course as of 08/31/2023 2356  Fri Sep 09, 2023  1629 ETT in appropriate position on wet read [SG]  1629 LKN 2 days ago at least per EMS. Will activate code medical. Spoke with neuro at bedside  [SG]  [phone] Spoke with friend at bedside, given updated history, reports she was talking to him on the phone around 2 PM today, he is speaking normally.  During her conversation she could hear him having difficulty breathing and he became unresponsive and the call was disconnected.  She called the apartment complex and [employer] and requested they assessed the patient in his residence.  She denies any known history of illicit drug use, chronic alcohol abuse.  She is unsure of his medical history [SG]  1829 Creatinine(!): 1.81 Similar to prior [SG]  1829 Per chart review hx cardiomyopathy, Amyloidosis, A-fib  on eliquis, CHF,CKD3 [SG]    Clinical Course User Index [SG] Jordan Leiter, DO     Patient intubated on arrival for airway protection  Discussed  Procedures .Critical Care  Performed by: Jordan Leiter, DO Authorized by: Jordan Leiter, DO   Critical care provider statement:    Critical care time (minutes):  65   Critical care time was exclusive of:  Separately billable procedures and treating other patients   Critical care was necessary to treat or prevent imminent or life-threatening deterioration of the following conditions:  Respiratory failure   Critical care was time spent personally by me on the following activities:  Development of treatment plan with patient or surrogate, discussions with consultants, evaluation of patient's response to treatment, examination of patient, ordering and review of laboratory studies, ordering and review of radiographic studies, ordering and performing treatments and interventions, pulse oximetry, re-evaluation of patient's condition, review of old charts and obtaining history from patient or surrogate   Care discussed with: admitting provider   Procedure Name: Intubation Date/Time: 08/30/2023 11:56 PM  Performed by: Jordan [name], DOPre-anesthesia Checklist: Patient identified, Patient being monitored, Emergency Drugs available, Timeout performed and Suction available Oxygen Delivery Method: Non-rebreather mask Preoxygenation: Pre-oxygenation with 100% oxygen Induction Type: Rapid sequence Ventilation: Mask ventilation without difficulty Tube size: 7.5 mm Number of attempts: 1 Placement Confirmation: ETT inserted through vocal cords under direct vision, CO2 detector and Breath sounds checked- equal and bilateral Secured at: 24 cm Tube secured with: ETT holder Dental Injury: Teeth and Oropharynx as per pre-operative assessment  Difficulty Due To: Difficulty was anticipated      (including  critical care time)  Medical Decision Making / ED Course    Medical Decision Making:    Jordan [name]. is a 77 y.o. male with past medical history as below, significant for afib on DOAC, diverticulosis, CHF, cardiomyopathy, NSVT, CKD who presents to the ED with complaint of ams. . The complaint involves an extensive differential diagnosis and also carries with it a high risk of complications and morbidity.  Serious etiology was considered. Ddx includes but is not limited to: Differential diagnoses for altered mental status includes but is not exclusive to alcohol, illicit or prescription medications, intracranial pathology such as stroke, intracerebral hemorrhage, fever or infectious causes including sepsis, hypoxemia, uremia, trauma, endocrine related disorders such as diabetes, hypoglycemia, thyroid-related diseases, etc.   Complete initial physical exam performed, notably the patient  was in acute distress on arrival, agonal breathing, intermittently responsive.    Reviewed and confirmed nursing documentation for past medical history, family history, social history.  Vital signs reviewed.    Clinical Course as of 08/31/2023 2356  Fri Sep 09, 2023  1629 ETT in appropriate position on wet read [SG]  1629 LKN 2 days ago at least per EMS. Will activate code medical. Spoke with neuro at bedside  [SG]  [phone] Spoke with friend at bedside, given updated history, reports she was talking to him on the phone around 2 PM today, he is speaking normally.  During her conversation she could hear him having difficulty breathing and he became unresponsive and the call was disconnected.  She called the apartment complex and [employer] and requested they assessed the patient in his residence.  She denies any known history of illicit drug use, chronic alcohol abuse.  She is unsure of his medical history [SG]  1829 Creatinine(!): 1.81 Similar to prior [SG]  1829 Per chart review hx cardiomyopathy, Amyloidosis, A-fib  on eliquis, CHF,CKD3 [SG]    Clinical Course User Index [SG] Jordan Leiter, DO     Patient intubated on arrival for airway protection  Discussed  Procedures .Critical Care  Performed by: Jordan Leiter, DO Authorized by: Jordan Leiter, DO   Critical care provider statement:    Critical care time (minutes):  65   Critical care time was exclusive of:  Separately billable procedures and treating other patients   Critical care was necessary to treat or prevent imminent or life-threatening deterioration of the following conditions:  Respiratory failure   Critical care was time spent personally by me on the following activities:  Development of treatment plan with patient or surrogate, discussions with consultants, evaluation of patient's response to treatment, examination of patient, ordering and review of laboratory studies, ordering and review of radiographic studies, ordering and performing treatments and interventions, pulse oximetry, re-evaluation of patient's condition, review of old charts and obtaining history from patient or surrogate   Care discussed with: admitting provider   Procedure Name: Intubation Date/Time: 08/30/2023 11:56 PM  Performed by: Jordan [name], DOPre-anesthesia Checklist: Patient identified, Patient being monitored, Emergency Drugs available, Timeout performed and Suction available Oxygen Delivery Method: Non-rebreather mask Preoxygenation: Pre-oxygenation with 100% oxygen Induction Type: Rapid sequence Ventilation: Mask ventilation without difficulty Tube size: 7.5 mm Number of attempts: 1 Placement Confirmation: ETT inserted through vocal cords under direct vision, CO2 detector and Breath sounds checked- equal and bilateral Secured at: 24 cm Tube secured with: ETT holder Dental Injury: Teeth and Oropharynx as per pre-operative assessment  Difficulty Due To: Difficulty was anticipated      (including  critical care time)  Medical Decision Making / ED Course    Medical Decision Making:    Jordan [name]. is a 77 y.o. male with past medical history as below, significant for afib on DOAC, diverticulosis, CHF, cardiomyopathy, NSVT, CKD who presents to the ED with complaint of ams. . The complaint involves an extensive differential diagnosis and also carries with it a high risk of complications and morbidity.  Serious etiology was considered. Ddx includes but is not limited to: Differential diagnoses for altered mental status includes but is not exclusive to alcohol, illicit or prescription medications, intracranial pathology such as stroke, intracerebral hemorrhage, fever or infectious causes including sepsis, hypoxemia, uremia, trauma, endocrine related disorders such as diabetes, hypoglycemia, thyroid-related diseases, etc.   Complete initial physical exam performed, notably the patient  was in acute distress on arrival, agonal breathing, intermittently responsive.    Reviewed and confirmed nursing documentation for past medical history, family history, social history.  Vital signs reviewed.    Clinical Course as of 08/31/2023 2356  Fri Sep 09, 2023  1629 ETT in appropriate position on wet read [SG]  1629 LKN 2 days ago at least per EMS. Will activate code medical. Spoke with neuro at bedside  [SG]  [phone] Spoke with friend at bedside, given updated history, reports she was talking to him on the phone around 2 PM today, he is speaking normally.  During her conversation she could hear him having difficulty breathing and he became unresponsive and the call was disconnected.  She called the apartment complex and [employer] and requested they assessed the patient in his residence.  She denies any known history of illicit drug use, chronic alcohol abuse.  She is unsure of his medical history [SG]  1829 Creatinine(!): 1.81 Similar to prior [SG]  1829 Per chart review hx cardiomyopathy, Amyloidosis, A-fib  on eliquis, CHF,CKD3 [SG]    Clinical Course User Index [SG] Jordan Leiter, DO     Patient intubated on arrival for airway protection  Discussed  [place] [facility] Provider Note  CSN: [id number] Arrival date & time: 09/26/2023 1554  Chief Complaint(s) Unresponsive and Respiratory Distress  HPI Jordan [name]. is a 77 y.o. male with past medical history as below, significant for afib on DOAC, diverticulosis, CHF, cardiomyopathy, NSVT, CKD who presents to the ED with complaint of ams.   Pt lives alone, [employer] was called out for wellness check and pt found down at his apartment, minimally responsive.  On EMS arrival GCS was 3, he was requiring bagging due to poor respiratory effort.  Appeared to have left-sided gaze deviation and aphasia.  Last known well over 2 days ago per EMS, no family at bedside, does not meet stroke activation criteria.  On arrival patient with intermittent agonal breathing, AMS, intubated for airway protection  Past Medical History Past Medical History:  Diagnosis Date   Atrial fib/flutter, transient (HCC)    Hx   Atrial fibrillation (HCC)    coumadin   Colon cancer (HCC)    family hx   Diverticulosis    Gout    Hemorrhoids    Hepatitis C    History of DVT (deep vein thrombosis)    History of nuclear stress test 04/2002   exercise; normal study    Hypertension    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HCC)    history of    Personal history of colonic polyps    Prostate cancer [facility])    radical prostatectomy    Patient Active Problem List   Diagnosis Date Noted   Acute metabolic encephalopathy 09/13/2023   Hypotension 04/28/2022   NSVT (nonsustained ventricular tachycardia) (HCC) 04/12/2022   Stage 3a chronic kidney disease (HCC)    Chronic hepatitis C without hepatic coma (HCC)    History of gout    Substance abuse (HCC)    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 Authorizing Provider  allopurinol (ZYLOPRIM) 100 MG tablet Take 1 tablet (100 mg total) by mouth daily. 05/18/21  Yes Angiulli, Mcarthur Rossetti, PA-C  apixaban (ELIQUIS) 5 MG TABS tablet Take 1 tablet (5 mg total) by mouth 2 (two) times daily. 05/18/21  Yes Angiulli, Mcarthur Rossetti, PA-C  atorvastatin (LIPITOR) 40 MG tablet Take 1 tablet (40 mg total) by mouth daily. 05/18/21  Yes Angiulli, Mcarthur Rossetti, PA-C  carvedilol (COREG) 25 MG tablet Take 25 mg by mouth 2 (two) times daily. 12/01/22  Yes [provider]  Eplontersen Sodium (WAINUA) 45 MG/0.8ML SOAJ Inject 45 mg into the skin every 30 (thirty) days. 03/03/23  Yes O'Neal, Ronnald Ramp, MD  FARXIGA 10 MG TABS tablet Take 10 mg by mouth daily. 03/13/22  Yes [provider]  furosemide (LASIX) 40 MG tablet Take 1 tablet (40 mg total) by mouth 2 (two) times daily. 07/07/23  Yes BranchAlben Spittle, MD  hydrALAZINE (APRESOLINE) 100 MG tablet TAKE 1 TABLET(100 MG) BY MOUTH THREE TIMES DAILY Patient taking differently: Take 100 mg by mouth 3 (three) times daily. 05/30/23  Yes

## 2023-09-09 NOTE — Progress Notes (Signed)
EEG complete - results pending. Neuro notified, waiting on EEG reading to remove leads.

## 2023-09-09 NOTE — Progress Notes (Signed)
Pt transported to and from CT4 on vent w/o any complications.

## 2023-09-09 NOTE — ED Notes (Signed)
Attempted foley insertion by 2 Rns with no success. Will notify MD.

## 2023-09-09 NOTE — Progress Notes (Signed)
ANTICOAGULATION CONSULT NOTE - Initial Consult  Pharmacy Consult for heparin Indication: atrial fibrillation  No Known Allergies  Patient Measurements: Weight: 86.2 kg (190 lb) Heparin Dosing Weight: TBW  Vital Signs: Temp: 95.5 F (35.3 C) (09/13 1838) Temp Source: Rectal (09/13 1838) BP: 112/88 (09/13 1942) Pulse Rate: 79 (09/13 1942)  Labs: Recent Labs    09/13/2023 1604 08/29/2023 1612 08/30/2023 1724 08/28/2023 1841  HGB 14.7 16.3 13.3  --   HCT 48.1 48.0 39.0  --   PLT 145*  --   --   --   APTT  --   --   --  27  LABPROT  --   --   --  17.4*  INR  --   --   --  1.4*  CREATININE 1.81* 1.70*  --   --   CKTOTAL 98  --   --   --     Estimated Creatinine Clearance: 37.6 mL/min (A) (by C-G formula based on SCr of 1.7 mg/dL (H)).   Medical History: Past Medical History:  Diagnosis Date   Atrial fib/flutter, transient (HCC)    Hx   Atrial fibrillation (HCC)    coumadin   Colon cancer (HCC)    family hx   Diverticulosis    Gout    Hemorrhoids    Hepatitis C    History of DVT (deep vein thrombosis)    History of nuclear stress test 04/2002   exercise; normal study    Hypertension    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HCC)    history of    Personal history of colonic polyps    Prostate cancer (HCC)    radical prostatectomy    Assessment: 77 YOM presenting unresponsive, hx of afib on Eliquis PTA with last dose unknown prior to arrival.    Goal of Therapy:  Heparin level 0.3-0.7 units/ml aPTT 66-102 seconds Monitor platelets by anticoagulation protocol: Yes   Plan:  Heparin gtt at 1200 units/hr, no bolus F/u 8 hour aPTT/HL   Daylene Posey, PharmD, [facility] Clinical Pharmacist ED Pharmacist Phone # [phone] 09/19/2023 7:53 PM

## 2023-09-09 NOTE — Consult Note (Signed)
Neurology Consultation  Reason for Consult: Altered mental status, leftward gaze Referring Physician: Dr. Tanda Rockers  CC: Altered mental status, leftward gaze  History is obtained from: Chart  HPI: Jordan [name]. is a 77 y.o. male past medical history of atrial fibrillation on Eliquis, hepatitis C, history of DVT, history of prostate cancer documented in the chart, presented to the emergency department after a welfare check found him down at home with a last known well at least 2 days ago.  Unable to protect his airway and emergently intubated.  Prior to intubation noted to have leftward gaze and some mumbling speech. Neurology was consulted for altered mental status, gaze preference. CT head-preliminary review no acute changes.   LKW: 2 to 3 days ago-unclear IV thrombolysis given?: no, unclear last known well EVT: No-clear last known well Premorbid modified Rankin scale (mRS):  unclear   ROS: Full ROS was performed and is negative except as noted in the HPI.    Past Medical History:  Diagnosis Date   Atrial fib/flutter, transient (HCC)    Hx   Atrial fibrillation (HCC)    coumadin   Colon cancer (HCC)    family hx   Diverticulosis    Gout    Hemorrhoids    Hepatitis C    History of DVT (deep vein thrombosis)    History of nuclear stress test 04/2002   exercise; normal study    Hypertension    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HCC)    history of    Personal history of colonic polyps    Prostate cancer (HCC)    radical prostatectomy    Family History  Problem Relation Age of Onset   Brain cancer Mother        brain tumor   Colon cancer Mother    Stroke Father    Rectal cancer Neg Hx    Stomach cancer Neg Hx    Esophageal cancer Neg Hx    Social History:   reports that he has never smoked. He has never used smokeless tobacco. He reports that he does not currently use alcohol after a past usage of about 3.0 standard drinks of alcohol per week. He  reports that he does not use drugs.  Medications  Current Facility-Administered Medications:    0.9 %  sodium chloride infusion, , Intravenous, Code/Trauma/Sedation Continuous Med, Sloan Leiter, DO, Last Rate: 999 mL/hr at 09/25/2023 1609, 1,000 mL at 09/02/2023 1609   etomidate (AMIDATE) injection, , Intravenous, Code/Trauma/Sedation Med, Tanda Rockers A, DO, 20 mg at 09/07/2023 1610   propofol (DIPRIVAN) 10 mg/mL bolus/IV push, , Intravenous, Code/Trauma/Sedation Med, Tanda Rockers A, DO, 30 mcg at 09/22/2023 1612   propofol (DIPRIVAN) 1000 MG/100ML infusion, 5-80 mcg/kg/min, Intravenous, Titrated, Tanda Rockers A, DO   rocuronium (ZEMURON) injection, , Intravenous, Code/Trauma/Sedation Med, Tanda Rockers A, DO, 100 mg at 09/22/2023 1608  Current Outpatient Medications:    allopurinol (ZYLOPRIM) 100 MG tablet, Take 1 tablet (100 mg total) by mouth daily., Disp: 30 tablet, Rfl: 0   apixaban (ELIQUIS) 5 MG TABS tablet, Take 1 tablet (5 mg total) by mouth 2 (two) times daily., Disp: 60 tablet, Rfl: 0   atorvastatin (LIPITOR) 40 MG tablet, Take 1 tablet (40 mg total) by mouth daily., Disp: 30 tablet, Rfl: 0   Blood Pressure Monitor KIT, Use as directed by your health care provider, Disp: 1 kit, Rfl: 0   carvedilol (COREG) 25 MG tablet, Take 25 mg by mouth 2 (two) times daily., Disp: ,  Rfl:    Eplontersen Sodium (WAINUA) 45 MG/0.8ML SOAJ, Inject 45 mg into the skin every 30 (thirty) days., Disp: 0.8 mL, Rfl: 12   FARXIGA 10 MG TABS tablet, Take 10 mg by mouth daily., Disp: , Rfl:    furosemide (LASIX) 40 MG tablet, Take 1 tablet (40 mg total) by mouth 2 (two) times daily., Disp: 186 tablet, Rfl: 1   hydrALAZINE (APRESOLINE) 100 MG tablet, TAKE 1 TABLET(100 MG) BY MOUTH THREE TIMES DAILY, Disp: 90 tablet, Rfl: 3   isosorbide mononitrate (IMDUR) 60 MG 24 hr tablet, Take 1 tablet (60 mg total) by mouth daily., Disp: 90 tablet, Rfl: 3   losartan (COZAAR) 100 MG tablet, Take 100 mg by mouth daily., Disp: , Rfl:     potassium chloride SA (KLOR-CON M) 20 MEQ tablet, Take 1 tablet (20 mEq total) by mouth 2 (two) times daily., Disp: 30 tablet, Rfl: 6   Tafamidis (VYNDAMAX) 61 MG CAPS, Take 61 mg (1 tablet) by mouth daily., Disp: 30 capsule, Rfl: 11  Exam: Current vital signs: BP (!) 147/115   Pulse (!) 111   Resp 18   Wt 86.2 kg   SpO2 92%   BMI 27.66 kg/m  Vital signs in last 24 hours: Pulse Rate:  [111-123] 111 09-30-23 1609) Resp:  [17-34] 18 30-Sep-2023 1622) BP: (147-166)/(115-150) 147/115 2023-09-30 1615) SpO2:  [92 %-95 %] 92 % 2023/09/30 1622) FiO2 (%):  [100 %] 100 % Sep 30, 2023 1622) Weight:  [86.2 kg] 86.2 kg 2023/09/30 1615)  Extreme limited neurological examination patient just given paralytics General: Intubated sedated HNT: Normocephalic atraumatic CVs: Regular rhythm Neurological exam Intubated sedated No spontaneous movement Pupils equal sluggishly reactive to light Gaze midline No spontaneous movement and no movement to noxious stimulation.  Labs I have reviewed labs in epic and the results pertinent to this consultation are: CBC    Component Value Date/Time   WBC 5.1 09/30/23 1604   RBC 5.30 2023/09/30 1604   HGB 13.3 Sep 30, 2023 1724   HGB 15.0 06/24/2022 1552   HCT 39.0 2023/09/30 1724   HCT 46.8 06/24/2022 1552   PLT 145 (L) 09-30-23 1604   PLT 206 06/24/2022 1552   MCV 90.8 09-30-2023 1604   MCV 89 06/24/2022 1552   MCH 27.7 2023/09/30 1604   MCHC 30.6 September 30, 2023 1604   RDW 19.9 (H) 09-30-23 1604   RDW 14.0 06/24/2022 1552   LYMPHSABS 0.9 09-30-23 1604   MONOABS 0.7 09-30-2023 1604   EOSABS 0.1 2023/09/30 1604   BASOSABS 0.0 September 30, 2023 1604     CMP     Component Value Date/Time   NA 142 30-Sep-2023 1612   NA 141 08/19/2023 1429   K 4.4 September 30, 2023 1612   CL 111 09/30/23 1612   CO2 21 08/19/2023 1429   GLUCOSE 87 Sep 30, 2023 1612   BUN 35 (H) 2023-09-30 1612   BUN 34 (H) 08/19/2023 1429   CREATININE 1.70 (H) 30-Sep-2023 1612   CREATININE 1.73 (H) 11/10/2021  1506   CALCIUM 9.2 08/19/2023 1429   PROT 8.3 (H) 04/19/2023 1650   PROT 6.9 05/11/2022 1527   ALBUMIN 3.0 (L) 04/19/2023 1650   ALBUMIN 4.0 05/03/2018 1017   AST 38 04/19/2023 1650   ALT 26 04/19/2023 1650   ALT 57 (H) 08/04/2021 1553   ALKPHOS 108 04/19/2023 1650   BILITOT 2.2 (H) 04/19/2023 1650   BILITOT 1.2 05/03/2018 1017   GFRNONAA 39 (L) 05/09/2023 1526   GFRAA >60 11/26/2018 1850    Lipid Panel  Component Value Date/Time   CHOL 150 04/28/2021 0446   CHOL 157 05/03/2018 1017   TRIG 51 04/28/2021 0446   HDL 37 (L) 04/28/2021 0446   HDL 55 05/03/2018 1017   CHOLHDL 4.1 04/28/2021 0446   VLDL 10 04/28/2021 0446   LDLCALC 103 (H) 04/28/2021 0446   LDLCALC 88 05/03/2018 1017    Lab Results  Component Value Date   HGBA1C 5.6 04/28/2021    Lactic acidosis-lactic acid 3  Imaging I have reviewed the images obtained:  CT-head: NAICP CT Csp and CAP pending  Assessment:  77 year old man past history of atrial fibrillation on Eliquis, but I do see, history of DVT history of prostate cancer found down after a welfare check with last known well at least 2 days maybe 3 days ago.  Unable to protect his airway and emergently intubated.  Prior to intubation question of some leftward gaze and mumbling speech at best. Neurology was consulted after intubation. Concern for bleed due to him being on anticoagulation.  Stat CT head completed with no evidence of bleed. Acute ischemic stroke versus seizure versus toxic metabolic encephalopathy remains in the differential. He is afebrile.  Has no leukocytosis and no neck stiffness-low suspicion for CNS infection.  Impression:  Toxic metabolic neuropathy versus seizure versus acute stroke  Recommendations: Admit to ICU MRI brain without contrast when able to UA, chest x-ray, U-Tox, ammonia, respiratory panel Routine EEG-if concern for seizure, may need LTM EEG. Neurology will follow.  -- Milon Dikes, MD Neurologist [facility] Pager: [phone]  CRITICAL CARE ATTESTATION Performed by: Milon Dikes, MD Total critical care time: 37 minutes Critical care time was exclusive of separately billable procedures and treating other patients and/or supervising APPs/Residents/Students Critical care was necessary to treat or prevent imminent or life-threatening deterioration. This patient is critically ill and at significant risk for neurological worsening and/or death and care requires constant monitoring. Critical care was time spent personally by me on the following activities: development of treatment plan with patient and/or surrogate as well as nursing, discussions with consultants, evaluation of patient's response to treatment, examination of patient, obtaining history from patient or surrogate, ordering and performing treatments and interventions, ordering and review of laboratory studies, ordering and review of radiographic studies, pulse oximetry, re-evaluation of patient's condition, participation in multidisciplinary rounds and medical decision making of high complexity in the care of this patient.

## 2023-09-09 NOTE — ED Notes (Addendum)
Disregard previous note

## 2023-09-09 NOTE — ED Notes (Signed)
MD notified of pt current temp, bair hugger in place.

## 2023-09-09 NOTE — H&P (Addendum)
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 Initially thought to have been found as welfare check after 2-3d without contact to others, but sounds like on further interview, he was on phone w family member 9/13 and was confused, got SOB, call was dc. There is question of sz activity prior to arrival. In ED GCS 6, intubated in this setting. Neuro consulted. CT non acute. Low suspicion for CNS infection. MRI ordered  PCCM to admit   Pertinent  Medical History  CKD IIIb Cardiac amyloid Afib  Chronic AC DVT  HTN  NICM  Significant Hospital Events: Including procedures, antibiotic start and stop dates in addition to other pertinent events     Interim History / Subjective:  Consult  Objective   Blood pressure (!) 135/110, pulse 75, temperature (!) 97.1 F (36.2 C), temperature source Axillary, resp. rate 18, weight 86.2 kg, SpO2 99%.    Vent Mode: PRVC FiO2 (%):  [70 %-100 %] 70 % Set Rate:  [18 bmp] 18 bmp Vt Set:  [570 mL] 570 mL PEEP:  [5 cmH20-8 cmH20] 5 cmH20  No intake or output data in the 24 hours ending 09/21/2023 1835 Filed Weights   09/24/2023 1615  Weight: 86.2 kg    Examination: General: chronically ill appearing HENT: ETT in place, small thick clear secretions Lungs: Harsh wheezing and rhonci bilaterally, double triggering on vent Cardiovascular: RRR, ext warm Abdomen: soft, +BS Extremities: No edema Neuro: RASS -4, pupils small, equal GU: deferred  Labs look benign  Resolved Hospital Problem list   N/A  Assessment & Plan:   Acute encephalopathy Acute respiratory failure Aspiration pneumonitis Afib NICM Query  cystitis Spleen nodule- OP f/u can be considered Chronic systolic HF Hereditary cardiac amyloidisis Primary HTN  CKD IIIb   - Vent bundle - EEG - MRI brain - Check ammonia - Systematic heparin in lieu of eliquis in case needs LP - Unasyn for aspiration, tracheal aspirate, MRSA PCR, monitor fever curve, urinary Ags - Hold antihypertensives for now - Consider earlier palliative involvement if does not do well  Best Practice (right click and "Reselect all SmartList Selections" daily)   Diet/type: NPO DVT prophylaxis: systemic heparin GI prophylaxis: PPI Lines: N/A Foley:  N/A Code Status:  full code Last date of multidisciplinary goals of care discussion [pending]  Labs   CBC: Recent Labs  Lab 09/22/2023 1604 09/22/2023 1612 08/29/2023 1724  WBC 5.1  --   --   NEUTROABS 3.4  --   --   HGB 14.7 16.3 13.3  HCT 48.1 48.0 39.0  MCV 90.8  --   --   PLT 145*  --   --     Basic Metabolic Panel: Recent Labs  Lab 09/19/2023 1604 09/26/2023 1612 09/08/2023 1724  NA 137 142 142  K 4.4 4.4 3.6  CL 108 111  --   CO2 18*  --   --   GLUCOSE 83 87  --   BUN 28* 35*  --   CREATININE 1.81* 1.70*  --   CALCIUM 8.9  --   --    GFR: Estimated Creatinine Clearance: 37.6 mL/min (A) (  by C-G formula based on SCr of 1.7 mg/dL (H)). Recent Labs  Lab 09/20/2023 1604 09/16/2023 1612  WBC 5.1  --   LATICACIDVEN  --  3.1*    Liver Function Tests: Recent Labs  Lab 09/07/2023 1604  AST 34  ALT 23  ALKPHOS 112  BILITOT 2.6*  PROT 7.7  ALBUMIN 2.9*   No results for input(s): "LIPASE", "AMYLASE" in the last 168 hours. No results for input(s): "AMMONIA" in the last 168 hours.  ABG    Component Value Date/Time   PHART 7.331 (L) 09/20/2023 1724   PCO2ART 32.8 09/20/2023 1724   PO2ART 138 (H) 09/22/2023 1724   HCO3 17.3 (L) 09/08/2023 1724   TCO2 18 (L) 08/30/2023 1724   ACIDBASEDEF 8.0 (H) 09/17/2023 1724   O2SAT 99 09/05/2023 1724     Coagulation Profile: No results for input(s):  "INR", "PROTIME" in the last 168 hours.  Cardiac Enzymes: Recent Labs  Lab 09/24/2023 1604  CKTOTAL 98    HbA1C: Hemoglobin A1C  Date/Time Value Ref Range Status  01/09/2019 04:03 PM 5.2 4.0 - 5.6 % Final   Hgb A1c MFr Bld  Date/Time Value Ref Range Status  04/28/2021 04:47 AM 5.6 4.8 - 5.6 % Final    Comment:    (NOTE) Pre diabetes:          5.7%-6.4%  Diabetes:              >6.4%  Glycemic control for   <7.0% adults with diabetes     CBG: Recent Labs  Lab 09/16/2023 1600  GLUCAP 74    Review of Systems:   Intubated/sedated  Past Medical History:  He,  has a past medical history of Atrial fib/flutter, transient (HCC), Atrial fibrillation (HCC), Colon cancer (HCC), Diverticulosis, Gout, Hemorrhoids, Hepatitis C, History of DVT (deep vein thrombosis), History of nuclear stress test (04/2002), Hypertension, 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HCC), Personal history of colonic polyps, and Prostate cancer (HCC).   Surgical History:   Past Surgical History:  Procedure Laterality Date   CARDIAC CATHETERIZATION  2007   no occlusive CAD   CARDIOVERSION N/A 10/07/2021   Procedure: CARDIOVERSION;  Surgeon: Chrystie Nose, MD;  Location: [facility] ENDOSCOPY;  Service: Cardiovascular;  Laterality: N/A;   CARDIOVERSION N/A 07/07/2022   Procedure: CARDIOVERSION;  Surgeon: Meriam Sprague, MD;  Location: [facility] ENDOSCOPY;  Service: Cardiovascular;  Laterality: N/A;   COLONOSCOPY  2002,2007   Lavina Hamman   PROSTATECTOMY     PROSTATECTOMY  04-04   Dr. Isabel Caprice   TRANSTHORACIC ECHOCARDIOGRAM  05/2006   EF ~60%; LV systolic function normal; mild focal basal septal hypertrophy; AV thickness mildly increased; LA mod-markedly dilate     Social History:   reports that he has never smoked. He has never used smokeless tobacco. He reports that he does not currently use alcohol after a past usage of about 3.0 standard drinks of alcohol per week. He reports that he does not use drugs.    Family History:  His family history includes Brain cancer in his mother; Colon cancer in his mother; Stroke in his father. There is no history of Rectal cancer, Stomach cancer, or Esophageal cancer.   Allergies No Known Allergies   Home Medications  Prior to Admission medications   Medication Sig Start Date End Date Taking? Authorizing Provider  allopurinol (ZYLOPRIM) 100 MG tablet Take 1 tablet (100 mg total) by mouth daily. 05/18/21   Angiulli, Mcarthur Rossetti, PA-C  apixaban (ELIQUIS) 5 MG TABS tablet Take  1 tablet (5 mg total) by mouth 2 (two) times daily. 05/18/21   Angiulli, Mcarthur Rossetti, PA-C  atorvastatin (LIPITOR) 40 MG tablet Take 1 tablet (40 mg total) by mouth daily. 05/18/21   Angiulli, Mcarthur Rossetti, PA-C  Blood Pressure Monitor KIT Use as directed by your health care provider 10/09/21   Chrystie Nose, MD  carvedilol (COREG) 25 MG tablet Take 25 mg by mouth 2 (two) times daily. 12/01/22   [provider]  Eplontersen Sodium (WAINUA) 45 MG/0.8ML SOAJ Inject 45 mg into the skin every 30 (thirty) days. 03/03/23   O'NealRonnald Ramp, MD  FARXIGA 10 MG TABS tablet Take 10 mg by mouth daily. 03/13/22   [provider]  furosemide (LASIX) 40 MG tablet Take 1 tablet (40 mg total) by mouth 2 (two) times daily. 07/07/23   Maisie Fus, MD  hydrALAZINE (APRESOLINE) 100 MG tablet TAKE 1 TABLET(100 MG) BY MOUTH THREE TIMES DAILY 05/30/23   Bensimhon, Bevelyn Buckles, MD  isosorbide mononitrate (IMDUR) 60 MG 24 hr tablet Take 1 tablet (60 mg total) by mouth daily. 07/07/23 07/01/24  Maisie Fus, MD  losartan (COZAAR) 100 MG tablet Take 100 mg by mouth daily. 05/16/22   [provider]  potassium chloride SA (KLOR-CON M) 20 MEQ tablet Take 1 tablet (20 mEq total) by mouth 2 (two) times daily. 04/29/23   Milford, Anderson Malta, FNP  Tafamidis (VYNDAMAX) 61 MG CAPS Take 61 mg (1 tablet) by mouth daily. 08/05/22   O'Neal, Ronnald Ramp, MD     Critical care time: 45 mins

## 2023-09-09 NOTE — Procedures (Signed)
Patient Name: Jordan [name].  MRN: [id number]  Epilepsy Attending: Charlsie Quest  Referring Physician/Provider: Milon Dikes, MD  Date: 09/03/2023 Duration: 47.29 mins  Patient history: 77yo M with ams getting eeg to evaluate for seizure  Level of alertness: comatose   AEDs during EEG study: Propofol  Technical aspects: This EEG study was done with scalp electrodes positioned according to the 10-20 International system of electrode placement. Electrical activity was reviewed with band pass filter of 1-70Hz , sensitivity of 7 uV/mm, display speed of 39mm/sec with a 60Hz  notched filter applied as appropriate. EEG data were recorded continuously and digitally stored.  Video monitoring was available and reviewed as appropriate.  Description: EEG showed near continuous low amplitude 3-5Hz  theta-delta slowing admixed with 13-15Hz  beta activity admixed with 1-3 seconds of generalized EEG suppression. Hyperventilation and photic stimulation were not performed.     ABNORMALITY - Continuous slow, generalized - Background suppression, generalized  IMPRESSION: This study is suggestive of severe to profound  diffuse encephalopathy likely related to sedation. No seizures or epileptiform discharges were seen throughout the recording.  Jordan [name]

## 2023-09-09 NOTE — Progress Notes (Signed)
Pharmacy Antibiotic Note  Jordan [name]. is a 77 y.o. male admitted on 08/28/2023 presenting non-responsive requiring intubation in ED, concern for aspiration pna.  Pharmacy has been consulted for zosyn dosing.  Plan: Unasyn 3g IV every 8 hours Monitor renal function, clinical progression and LOT  Weight: 86.2 kg (190 lb)  Temp (24hrs), Avg:96.3 F (35.7 C), Min:95.5 F (35.3 C), Max:97.1 F (36.2 C)  Recent Labs  Lab 09/02/2023 1604 09/13/2023 1612  WBC 5.1  --   CREATININE 1.81* 1.70*  LATICACIDVEN  --  3.1*    Estimated Creatinine Clearance: 37.6 mL/min (A) (by C-G formula based on SCr of 1.7 mg/dL (H)).    No Known Allergies  Daylene Posey, PharmD, [facility] Clinical Pharmacist ED Pharmacist Phone # [phone] 09/03/2023 7:00 PM

## 2023-09-09 NOTE — ED Triage Notes (Signed)
Pt bib [facility] from home. EMS called out for wellness check. EMS reports apartment staff last saw him two days ago and patient lives alone. EMS found patient unresponsive. Initial GCS of 3, bagging patient at time of arrival. Pt responding to voice.

## 2023-09-09 NOTE — Progress Notes (Signed)
LTM EEG running - no initial skin breakdown - push button tested - neuro notified. Study LTM'd as per Melynda Ripple

## 2023-09-09 NOTE — ED Notes (Signed)
Unsuccessful attempt for coude catheter by [place], Charity fundraiser.

## 2023-09-10 ENCOUNTER — Inpatient Hospital Stay (HOSPITAL_COMMUNITY): Payer: Medicare Other

## 2023-09-10 DIAGNOSIS — R569 Unspecified convulsions: Secondary | ICD-10-CM

## 2023-09-10 DIAGNOSIS — R4182 Altered mental status, unspecified: Secondary | ICD-10-CM | POA: Diagnosis not present

## 2023-09-10 DIAGNOSIS — G9341 Metabolic encephalopathy: Secondary | ICD-10-CM | POA: Diagnosis not present

## 2023-09-10 LAB — POCT I-STAT 7, (LYTES, BLD GAS, ICA,H+H)
Acid-base deficit: 22 mmol/L — ABNORMAL HIGH (ref 0.0–2.0)
Bicarbonate: 7.3 mmol/L — ABNORMAL LOW (ref 20.0–28.0)
Calcium, Ion: 1.03 mmol/L — ABNORMAL LOW (ref 1.15–1.40)
HCT: 39 % (ref 39.0–52.0)
Hemoglobin: 13.3 g/dL (ref 13.0–17.0)
O2 Saturation: 99 %
Patient temperature: 36.6
Potassium: 4.1 mmol/L (ref 3.5–5.1)
Sodium: 145 mmol/L (ref 135–145)
TCO2: 8 mmol/L — ABNORMAL LOW (ref 22–32)
pCO2 arterial: 27.5 mmHg — ABNORMAL LOW (ref 32–48)
pH, Arterial: 7.032 — CL (ref 7.35–7.45)
pO2, Arterial: 189 mmHg — ABNORMAL HIGH (ref 83–108)

## 2023-09-10 LAB — GLUCOSE, CAPILLARY
Glucose-Capillary: 92 mg/dL (ref 70–99)
Glucose-Capillary: 93 mg/dL (ref 70–99)
Glucose-Capillary: 89 mg/dL (ref 70–99)

## 2023-09-10 LAB — URINALYSIS, ROUTINE W REFLEX MICROSCOPIC
Bilirubin Urine: NEGATIVE
Glucose, UA: 100 mg/dL — AB
Ketones, ur: NEGATIVE mg/dL
Nitrite: POSITIVE — AB
Protein, ur: >300 mg/dL — AB
Specific Gravity, Urine: 1.02 (ref 1.005–1.030)
pH: 5 (ref 5.0–8.0)

## 2023-09-10 LAB — COMPREHENSIVE METABOLIC PANEL
ALT: 19 U/L (ref 0–44)
AST: 26 U/L (ref 15–41)
Albumin: 2.6 g/dL — ABNORMAL LOW (ref 3.5–5.0)
Alkaline Phosphatase: 111 U/L (ref 38–126)
Anion gap: 12 (ref 5–15)
BUN: 25 mg/dL — ABNORMAL HIGH (ref 8–23)
CO2: 17 mmol/L — ABNORMAL LOW (ref 22–32)
Calcium: 8.7 mg/dL — ABNORMAL LOW (ref 8.9–10.3)
Chloride: 111 mmol/L (ref 98–111)
Creatinine, Ser: 2.01 mg/dL — ABNORMAL HIGH (ref 0.61–1.24)
GFR, Estimated: 34 mL/min — ABNORMAL LOW (ref >60)
Glucose, Bld: 80 mg/dL (ref 70–99)
Potassium: 4.4 mmol/L (ref 3.5–5.1)
Sodium: 140 mmol/L (ref 135–145)
Total Bilirubin: 2.7 mg/dL — ABNORMAL HIGH (ref 0.3–1.2)
Total Protein: 7.2 g/dL (ref 6.5–8.1)

## 2023-09-10 LAB — CBC
HCT: 46.6 % (ref 39.0–52.0)
Hemoglobin: 14.6 g/dL (ref 13.0–17.0)
MCH: 28.4 pg (ref 26.0–34.0)
MCHC: 31.3 g/dL (ref 30.0–36.0)
MCV: 90.7 fL (ref 80.0–100.0)
Platelets: 142 10*3/uL — ABNORMAL LOW (ref 150–400)
RBC: 5.14 MIL/uL (ref 4.22–5.81)
RDW: 19.9 % — ABNORMAL HIGH (ref 11.5–15.5)
WBC: 6.5 10*3/uL (ref 4.0–10.5)
nRBC: 0.3 % — ABNORMAL HIGH (ref 0.0–0.2)

## 2023-09-10 LAB — RAPID URINE DRUG SCREEN, HOSP PERFORMED
Amphetamines: NOT DETECTED
Barbiturates: NOT DETECTED
Benzodiazepines: NOT DETECTED
Cocaine: NOT DETECTED
Opiates: NOT DETECTED
Tetrahydrocannabinol: NOT DETECTED

## 2023-09-10 LAB — EXPECTORATED SPUTUM ASSESSMENT W GRAM STAIN, RFLX TO RESP C

## 2023-09-10 LAB — TRIGLYCERIDES
Triglycerides: 60 mg/dL (ref <150)
Triglycerides: 64 mg/dL (ref <150)

## 2023-09-10 LAB — URINALYSIS, MICROSCOPIC (REFLEX)
Bacteria, UA: NONE SEEN
RBC / HPF: >50 RBC/hpf (ref 0–5)

## 2023-09-10 LAB — HEPARIN LEVEL (UNFRACTIONATED): Heparin Unfractionated: 0.24 [IU]/mL — ABNORMAL LOW (ref 0.30–0.70)

## 2023-09-10 LAB — APTT: aPTT: 47 s — ABNORMAL HIGH (ref 24–36)

## 2023-09-10 MED ORDER — EPINEPHRINE 1 MG/10ML IJ SOSY
PREFILLED_SYRINGE | INTRAMUSCULAR | Status: AC
  Administered 2023-09-10: 1 mg
  Filled 2023-09-10: qty 10

## 2023-09-10 MED ORDER — METOPROLOL TARTRATE 25 MG PO TABS
25.0000 mg | ORAL_TABLET | Freq: Two times a day (BID) | ORAL | Status: DC
  Administered 2023-09-10: 25 mg
  Filled 2023-09-10: qty 1

## 2023-09-10 MED ORDER — SODIUM BICARBONATE 8.4 % IV SOLN
INTRAVENOUS | Status: AC
  Administered 2023-09-10: 50 meq
  Filled 2023-09-10: qty 100

## 2023-09-10 MED ORDER — NOREPINEPHRINE 4 MG/250ML-% IV SOLN
INTRAVENOUS | Status: AC
  Administered 2023-09-10: 20 ug/min via INTRAVENOUS
  Filled 2023-09-10: qty 250

## 2023-09-10 MED ORDER — NOREPINEPHRINE 4 MG/250ML-% IV SOLN: 0.0000 ug/min | INTRAVENOUS | Status: DC

## 2023-09-10 MED ORDER — EPINEPHRINE HCL 5 MG/250ML IV SOLN IN NS: 0.5000 ug/min | INTRAVENOUS | Status: DC

## 2023-09-10 MED ORDER — SODIUM BICARBONATE 8.4 % IV SOLN
INTRAVENOUS | Status: DC
  Filled 2023-09-10: qty 150

## 2023-09-10 MED ORDER — METOPROLOL TARTRATE 5 MG/5ML IV SOLN
5.0000 mg | Freq: Once | INTRAVENOUS | Status: AC
  Administered 2023-09-10: 5 mg via INTRAVENOUS
  Filled 2023-09-10: qty 5

## 2023-09-10 MED ORDER — EPINEPHRINE HCL 5 MG/250ML IV SOLN IN NS
INTRAVENOUS | Status: AC
  Administered 2023-09-10: 15 ug/min via INTRAVENOUS
  Filled 2023-09-10: qty 250

## 2023-09-10 MED ORDER — ATROPINE SULFATE 1 MG/10ML IJ SOSY
PREFILLED_SYRINGE | INTRAMUSCULAR | Status: AC
  Filled 2023-09-10: qty 10

## 2023-09-12 LAB — URINE DRUGS OF ABUSE SCREEN W ALC, ROUTINE (REF LAB)
Amphetamines, Urine: NEGATIVE ng/mL
Barbiturate, Ur: NEGATIVE ng/mL
Benzodiazepine Quant, Ur: NEGATIVE ng/mL
Cannabinoid Quant, Ur: NEGATIVE ng/mL
Cocaine (Metab.): NEGATIVE ng/mL
Ethanol U, Quan: NEGATIVE %
Methadone Screen, Urine: NEGATIVE ng/mL
Opiate Quant, Ur: NEGATIVE ng/mL
Phencyclidine, Ur: NEGATIVE ng/mL
Propoxyphene, Urine: NEGATIVE ng/mL

## 2023-09-12 LAB — HEMOGLOBIN A1C
Hgb A1c MFr Bld: 6.1 % — ABNORMAL HIGH (ref 4.8–5.6)
Mean Plasma Glucose: 128 mg/dL

## 2023-09-12 LAB — LEGIONELLA PNEUMOPHILA SEROGP 1 UR AG: L. pneumophila Serogp 1 Ur Ag: NEGATIVE

## 2023-09-12 LAB — GLUCOSE, CAPILLARY: Glucose-Capillary: 108 mg/dL — ABNORMAL HIGH (ref 70–99)

## 2023-09-13 LAB — CULTURE, RESPIRATORY W GRAM STAIN: Culture: NORMAL

## 2023-09-15 ENCOUNTER — Other Ambulatory Visit: Payer: Self-pay

## 2023-09-27 NOTE — Progress Notes (Signed)
Spoke with Jordan [name] from medical examiner's office, Mr. Jordan [name] determined that this patient will not be an ME case due to his extensive medical history. Given verbal okay to remove all lines and tubes.

## 2023-09-27 NOTE — Procedures (Signed)
Cardiopulmonary Resuscitation Note  Mahlik Hladky  [id number]  Oct 24, 1946  Date:09/23/2023  Time:1:23 PM   Provider Performing:Saanya Zieske   Procedure: Cardiopulmonary Resuscitation (92950)  Indication(s) Loss of Pulse  Consent N/A  Anesthesia N/A   Time Out N/A   Sterile Technique Hand hygiene, gloves   Procedure Description Called to patient's room for CODE BLUE. Initial rhythm was PEA/Asystole. Patient received high quality chest compressions for 10 minutes and with ROSC and then went into caridac arrest after 30 mins with 3 minutes CPR with defibrillation or cardioversion when appropriate. Epinephrine was administered every 3 minutes as directed by time Biomedical engineer. Additional pharmacologic interventions included sodium bicarbonate. Additional procedural interventions include central line and arterial line.  Return of spontaneous circulation was achieved.  Family at bedside.   Complications/Tolerance N/A   EBL N/A   Specimen(s) N/A  Estimated time to ROSC: 10 mins and then 3 minutes  Chilton Greathouse MD [facility] See Amion for pager  If no response to pager , please call [phone] until 7pm After 7:00 pm call Elink  [phone] 09/22/2023, 1:25 PM

## 2023-09-27 NOTE — Progress Notes (Addendum)
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 Initially thought to have been found as welfare check after 2-3d without contact to others, but sounds like on further interview, he was on phone w family member 9/13 and was confused, got SOB, call was dc. There is question of sz activity prior to arrival. In ED GCS 6, intubated in this setting. Neuro consulted. CT non acute. Low suspicion for CNS infection. MRI ordered  PCCM to admit   Pertinent  Medical History  CKD IIIb Cardiac amyloid Afib  Chronic AC DVT  HTN  NICM  Significant Hospital Events: Including procedures, antibiotic start and stop dates in addition to other pertinent events     Interim History / Subjective:   Remains on the ventilator, poor mental status Difficult Foley placement needing urology help. Now has hematuria  Objective   Blood pressure 125/83, pulse 81, temperature 98.6 F (37 C), resp. rate (!) 0, weight 90.9 kg, SpO2 100%.    Vent Mode: PRVC FiO2 (%):  [70 %-100 %] 70 % Set Rate:  [18 bmp] 18 bmp Vt Set:  [570 mL] 570 mL PEEP:  [5 cmH20-8 cmH20] 5 cmH20 Plateau Pressure:  [14 cmH20-16 cmH20] 15 cmH20   Intake/Output Summary (Last 24 hours) at 2023/09/29 0745 Last data filed at 09-29-23 0700 Gross per 24 hour  Intake 2226.8 ml  Output --  Net 2226.8 ml   Filed Weights   09/12/2023 1615 09/14/2023 2100 09/29/23 0414  Weight: 86.2 kg 90.9 kg 90.9 kg    Examination: Gen:      No acute distress HEENT:  EOMI, sclera anicteric Neck:     No masses; no thyromegaly, ET tube Lungs:    Clear to auscultation bilaterally; normal respiratory effort CV:         Regular  rate and rhythm; no murmurs Abd:      + bowel sounds; soft, non-tender; no palpable masses, no distension Ext:    No edema; adequate peripheral perfusion Skin:      Warm and dry; no rash Neuro: Sedated, unresponsive  Labs/imaging reviewed Labs are pending Chest x-ray with mild septal thickening  Resolved Hospital Problem list   N/A  Assessment & Plan:  Altered mental status, acute metabolic encephalopathy Follow EEG.  Discussed with neuro May need MRI Observe off sedation  Acute respiratory failure, aspiration pneumonia Continue vent support Unasyn Follow Legionella, pneumococcus, cultures  Lactic acidosis Improved  Urinary retention, hematuria after Foley placement Stop heparin, monitor May need CBI if hematuria continues  Atrial fibrillation Chronic systolic heart failure Cardiac amyloidosis Hold heparin due to hematuria Hold outpatient antihypertensives and meds for now  Spleen lesion Will need outpatient follow-up with MRI  Cirrhosis on CT scan Has mild bilirubin elevation, transaminases are normal Follow-up LFTs  CKD 3B Monitor urine output and creatinine  Best Practice (right click and "Reselect all SmartList Selections" daily)   Diet/type: NPO DVT prophylaxis: SCD GI prophylaxis: PPI Lines: N/A Foley:  N/A Code Status:  full code Last date of multidisciplinary goals of care discussion [pending]  Critical care time:    The patient is critically ill with multiple organ  system failure and requires high complexity decision making for assessment and support, frequent evaluation and titration of therapies, advanced monitoring, review of radiographic studies and interpretation of complex data.   Critical Care Time devoted to patient care services, exclusive of separately billable procedures, described in this note is 55 minutes.   Chilton Greathouse MD [facility] See Amion for pager  If no response to pager , please call [phone] until 7pm After 7:00 pm call Elink  [phone] 2023-10-07, 8:30 AM

## 2023-09-27 NOTE — Progress Notes (Signed)
Urologist Traci Sermon, MD to bedside at 0645 due to unsuccessful attempts x 3 in ED to place urinary catheter. This RN assisted in placement of coude catheter by Dr. Lafonda Mosses. Documented in LDA.

## 2023-09-27 NOTE — Progress Notes (Signed)
Phone number for son Charna Elizabeth [phone].

## 2023-09-27 NOTE — Progress Notes (Signed)
PT moved to 35m06. [facility] MONITOR[facility]

## 2023-09-27 NOTE — Procedures (Signed)
Arterial Catheter Insertion Procedure Note  Jordan [name]  [id number]  09-20-1946  Date:09/21/2023  Time:2:49 PM    Provider Performing: Alphonzo Lemmings D. Harris    Procedure: Insertion of Arterial Line (41324) with US guidance (40102)   Indication(s) Blood pressure monitoring and/or need for frequent ABGs  Consent Unable to obtain consent due to emergent nature of procedure.  Anesthesia None   Time Out Verified patient identification, verified procedure, site/side was marked, verified correct patient position, special equipment/implants available, medications/allergies/relevant history reviewed, required imaging and test results available.   Sterile Technique Maximal sterile technique including full sterile barrier drape, hand hygiene, sterile gown, sterile gloves, mask, hair covering, sterile ultrasound probe cover (if used).   Procedure Description Area of catheter insertion was cleaned with chlorhexidine and draped in sterile fashion. With real-time ultrasound guidance an arterial catheter was placed into the right femoral artery.  Appropriate arterial tracings confirmed on monitor.     Complications/Tolerance None; patient tolerated the procedure well.   EBL Minimal   Specimen(s) None  [name] D. Harris, NP-C [facility] Personal contact information can be found on Amion  If no contact or response made please call [phone] 09/19/2023, 2:49 PM

## 2023-09-27 NOTE — Progress Notes (Signed)
LTM EEG discontinued - no skin breakdown at unhook.  

## 2023-09-27 NOTE — Progress Notes (Signed)
eLink Physician-Brief Progress Note Patient Name: Jordan [name]. DOB: 02-Nov-1946 MRN: [id number]   Date of Service  09/22/2023  HPI/Events of Note  Obstructive uropathy with acute urinary retention and failed Foley and Coude catheter attempts.  eICU Interventions  Urology consulted.        Thomasene Lot Sheron Robin 09/18/2023, 6:04 AM

## 2023-09-27 NOTE — Procedures (Signed)
Patient Name: Jordan [name].  MRN: [id number]  Epilepsy Attending: Charlsie Quest  Referring Physician/Provider: Milon Dikes, MD  Duration: 09/23/2023 2007 to 09/03/2023 1230   Patient history: 77yo M with ams getting eeg to evaluate for seizure   Level of alertness: comatose    AEDs during EEG study: Propofol   Technical aspects: This EEG study was done with scalp electrodes positioned according to the 10-20 International system of electrode placement. Electrical activity was reviewed with band pass filter of 1-70Hz , sensitivity of 7 uV/mm, display speed of 3mm/sec with a 60Hz  notched filter applied as appropriate. EEG data were recorded continuously and digitally stored.  Video monitoring was available and reviewed as appropriate.   Description: EEG showed near continuous low amplitude 3-5Hz  theta-delta slowing admixed with 13-15Hz  beta activity. Around noon on 09/11/2023,EEG worsened and showed generalized background suppression. CPR was started but patient eventually passed away. Hyperventilation and photic stimulation were not performed.      ABNORMALITY - Continuous slow, generalized   IMPRESSION: This study is suggestive of severe diffuse encephalopathy likely related to sedation. No seizures or epileptiform discharges were seen throughout the recording.  Around noon on 09/11/2023, EEG worsened and was suggestive of profound diffuse encephalopathy. CPR was started but patient eventually passed away.    Jordan [name]

## 2023-09-27 NOTE — Consult Note (Signed)
tube placement. EXAM: PORTABLE CHEST 1 VIEW COMPARISON:  Chest x-ray 04/19/2023 and older FINDINGS: ET tube in place with the tip 3.3 cm above the carina. Enteric tube with tip extending beneath the diaphragm. Enlarged heart with some vascular congestion.  Interstitial prominence as well. Left midlung scar or atelectasis. No pneumothorax, effusion. Overlapping cardiac leads. No consolidation. IMPRESSION: Enlarged cardiopericardial silhouette with some vascular congestion. Interstitial prominence. Question trace edema ET tube and enteric tube Electronically Signed   By: Karen Kays M.D.   On: 09/16/2023 17:47   DG Abdomen 1 View  Result Date: 08/31/2023 CLINICAL DATA:  OG tube placement EXAM: ABDOMEN - 1 VIEW limited for tube placement COMPARISON:  None Available. FINDINGS: Limited x-ray of the upper abdomen has a enteric tube with tip overlying the upper stomach. Gas seen in nondilated loops of bowel elsewhere in the visualized abdomen. Overlapping cardiac leads. IMPRESSION: Limited x-ray has a enteric tube with tip overlying the upper stomach. Electronically Signed   By: Karen Kays M.D.   On: 09/14/2023 17:46    I independently reviewed the above imaging studies.  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 if remains intubated sedated can maintain, would need to be changed out monthly, which could be attempted by nursing staff --please call with any questions  Irine Seal MD 09/02/2023, 8:53 AM  [facility]  Pager: [phone]  pH, Arterial 7.331 (L) 7.35 - 7.45   pCO2 arterial 32.8 32 - 48 mmHg   pO2, Arterial 138 (H) 83 - 108 mmHg   Bicarbonate 17.3 (L) 20.0 - 28.0 mmol/L   TCO2 18 (L) 22 - 32 mmol/L   O2 Saturation 99 %   Acid-base deficit 8.0 (H) 0.0 - 2.0 mmol/L   Sodium 142 135 - 145 mmol/L   Potassium 3.6 3.5 - 5.1 mmol/L   Calcium, Ion 1.21 1.15 - 1.40 mmol/L   HCT 39.0 39.0 - 52.0 %   Hemoglobin 13.3 13.0 - 17.0 g/dL   Collection site RADIAL, [facility]'S TEST ACCEPTABLE    Drawn by RT    Sample type ARTERIAL   Brain natriuretic peptide     Status: Abnormal   Collection Time: 09/23/2023  6:41 PM  Result Value Ref Range   B Natriuretic Peptide 2,523.7 (H) 0.0 - 100.0 pg/mL  SARS Coronavirus 2 by RT PCR (hospital order, performed in [facility] hospital lab) *cepheid single result test* Anterior Nasal Swab     Status: None   Collection Time: 09/07/2023  6:41 PM   Specimen: Anterior Nasal Swab  Result Value Ref Range   SARS Coronavirus 2 by RT PCR NEGATIVE NEGATIVE  Protime-INR     Status: Abnormal   Collection Time: 09/20/2023  6:41 PM  Result Value Ref Range   Prothrombin Time 17.4 (H) 11.4 - 15.2  seconds   INR 1.4 (H) 0.8 - 1.2  APTT     Status: None   Collection Time: 09/08/2023  6:41 PM  Result Value Ref Range   aPTT 27 24 - 36 seconds  Troponin I (High Sensitivity)     Status: Abnormal   Collection Time: 09/08/2023  6:41 PM  Result Value Ref Range   Troponin I (High Sensitivity) 47 (H) <18 ng/L  Respiratory (~20 pathogens) panel by PCR     Status: None   Collection Time: 09/16/2023  6:41 PM   Specimen: Nasopharyngeal Swab; Respiratory  Result Value Ref Range   Adenovirus NOT DETECTED NOT DETECTED   Coronavirus 229E NOT DETECTED NOT DETECTED   Coronavirus HKU1 NOT DETECTED NOT DETECTED   Coronavirus NL63 NOT DETECTED NOT DETECTED   Coronavirus OC43 NOT DETECTED NOT DETECTED   Metapneumovirus NOT DETECTED NOT DETECTED   Rhinovirus / Enterovirus NOT DETECTED NOT DETECTED   Influenza A NOT DETECTED NOT DETECTED   Influenza B NOT DETECTED NOT DETECTED   Parainfluenza Virus 1 NOT DETECTED NOT DETECTED   Parainfluenza Virus 2 NOT DETECTED NOT DETECTED   Parainfluenza Virus 3 NOT DETECTED NOT DETECTED   Parainfluenza Virus 4 NOT DETECTED NOT DETECTED   Respiratory Syncytial Virus NOT DETECTED NOT DETECTED   Bordetella pertussis NOT DETECTED NOT DETECTED   Bordetella Parapertussis NOT DETECTED NOT DETECTED   Chlamydophila pneumoniae NOT DETECTED NOT DETECTED   Mycoplasma pneumoniae NOT DETECTED NOT DETECTED  Procalcitonin     Status: None   Collection Time: 09/03/2023  6:41 PM  Result Value Ref Range   Procalcitonin <0.10 ng/mL  Ammonia     Status: Abnormal   Collection Time: 09/04/2023  7:43 PM  Result Value Ref Range   Ammonia 38 (H) 9 - 35 umol/L  Troponin I (High Sensitivity)     Status: Abnormal   Collection Time: 09/07/2023  7:43 PM  Result Value Ref Range   Troponin I (High Sensitivity) 50 (H) <18 ng/L  CBG monitoring, ED     Status: None   Collection Time:  tube placement. EXAM: PORTABLE CHEST 1 VIEW COMPARISON:  Chest x-ray 04/19/2023 and older FINDINGS: ET tube in place with the tip 3.3 cm above the carina. Enteric tube with tip extending beneath the diaphragm. Enlarged heart with some vascular congestion.  Interstitial prominence as well. Left midlung scar or atelectasis. No pneumothorax, effusion. Overlapping cardiac leads. No consolidation. IMPRESSION: Enlarged cardiopericardial silhouette with some vascular congestion. Interstitial prominence. Question trace edema ET tube and enteric tube Electronically Signed   By: Karen Kays M.D.   On: 09/16/2023 17:47   DG Abdomen 1 View  Result Date: 08/31/2023 CLINICAL DATA:  OG tube placement EXAM: ABDOMEN - 1 VIEW limited for tube placement COMPARISON:  None Available. FINDINGS: Limited x-ray of the upper abdomen has a enteric tube with tip overlying the upper stomach. Gas seen in nondilated loops of bowel elsewhere in the visualized abdomen. Overlapping cardiac leads. IMPRESSION: Limited x-ray has a enteric tube with tip overlying the upper stomach. Electronically Signed   By: Karen Kays M.D.   On: 09/14/2023 17:46    I independently reviewed the above imaging studies.  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 if remains intubated sedated can maintain, would need to be changed out monthly, which could be attempted by nursing staff --please call with any questions  Irine Seal MD 09/02/2023, 8:53 AM  [facility]  Pager: [phone]  tube placement. EXAM: PORTABLE CHEST 1 VIEW COMPARISON:  Chest x-ray 04/19/2023 and older FINDINGS: ET tube in place with the tip 3.3 cm above the carina. Enteric tube with tip extending beneath the diaphragm. Enlarged heart with some vascular congestion.  Interstitial prominence as well. Left midlung scar or atelectasis. No pneumothorax, effusion. Overlapping cardiac leads. No consolidation. IMPRESSION: Enlarged cardiopericardial silhouette with some vascular congestion. Interstitial prominence. Question trace edema ET tube and enteric tube Electronically Signed   By: Karen Kays M.D.   On: 09/16/2023 17:47   DG Abdomen 1 View  Result Date: 08/31/2023 CLINICAL DATA:  OG tube placement EXAM: ABDOMEN - 1 VIEW limited for tube placement COMPARISON:  None Available. FINDINGS: Limited x-ray of the upper abdomen has a enteric tube with tip overlying the upper stomach. Gas seen in nondilated loops of bowel elsewhere in the visualized abdomen. Overlapping cardiac leads. IMPRESSION: Limited x-ray has a enteric tube with tip overlying the upper stomach. Electronically Signed   By: Karen Kays M.D.   On: 09/14/2023 17:46    I independently reviewed the above imaging studies.  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 if remains intubated sedated can maintain, would need to be changed out monthly, which could be attempted by nursing staff --please call with any questions  Irine Seal MD 09/02/2023, 8:53 AM  [facility]  Pager: [phone]  pH, Arterial 7.331 (L) 7.35 - 7.45   pCO2 arterial 32.8 32 - 48 mmHg   pO2, Arterial 138 (H) 83 - 108 mmHg   Bicarbonate 17.3 (L) 20.0 - 28.0 mmol/L   TCO2 18 (L) 22 - 32 mmol/L   O2 Saturation 99 %   Acid-base deficit 8.0 (H) 0.0 - 2.0 mmol/L   Sodium 142 135 - 145 mmol/L   Potassium 3.6 3.5 - 5.1 mmol/L   Calcium, Ion 1.21 1.15 - 1.40 mmol/L   HCT 39.0 39.0 - 52.0 %   Hemoglobin 13.3 13.0 - 17.0 g/dL   Collection site RADIAL, [facility]'S TEST ACCEPTABLE    Drawn by RT    Sample type ARTERIAL   Brain natriuretic peptide     Status: Abnormal   Collection Time: 09/23/2023  6:41 PM  Result Value Ref Range   B Natriuretic Peptide 2,523.7 (H) 0.0 - 100.0 pg/mL  SARS Coronavirus 2 by RT PCR (hospital order, performed in [facility] hospital lab) *cepheid single result test* Anterior Nasal Swab     Status: None   Collection Time: 09/07/2023  6:41 PM   Specimen: Anterior Nasal Swab  Result Value Ref Range   SARS Coronavirus 2 by RT PCR NEGATIVE NEGATIVE  Protime-INR     Status: Abnormal   Collection Time: 09/20/2023  6:41 PM  Result Value Ref Range   Prothrombin Time 17.4 (H) 11.4 - 15.2  seconds   INR 1.4 (H) 0.8 - 1.2  APTT     Status: None   Collection Time: 09/08/2023  6:41 PM  Result Value Ref Range   aPTT 27 24 - 36 seconds  Troponin I (High Sensitivity)     Status: Abnormal   Collection Time: 09/08/2023  6:41 PM  Result Value Ref Range   Troponin I (High Sensitivity) 47 (H) <18 ng/L  Respiratory (~20 pathogens) panel by PCR     Status: None   Collection Time: 09/16/2023  6:41 PM   Specimen: Nasopharyngeal Swab; Respiratory  Result Value Ref Range   Adenovirus NOT DETECTED NOT DETECTED   Coronavirus 229E NOT DETECTED NOT DETECTED   Coronavirus HKU1 NOT DETECTED NOT DETECTED   Coronavirus NL63 NOT DETECTED NOT DETECTED   Coronavirus OC43 NOT DETECTED NOT DETECTED   Metapneumovirus NOT DETECTED NOT DETECTED   Rhinovirus / Enterovirus NOT DETECTED NOT DETECTED   Influenza A NOT DETECTED NOT DETECTED   Influenza B NOT DETECTED NOT DETECTED   Parainfluenza Virus 1 NOT DETECTED NOT DETECTED   Parainfluenza Virus 2 NOT DETECTED NOT DETECTED   Parainfluenza Virus 3 NOT DETECTED NOT DETECTED   Parainfluenza Virus 4 NOT DETECTED NOT DETECTED   Respiratory Syncytial Virus NOT DETECTED NOT DETECTED   Bordetella pertussis NOT DETECTED NOT DETECTED   Bordetella Parapertussis NOT DETECTED NOT DETECTED   Chlamydophila pneumoniae NOT DETECTED NOT DETECTED   Mycoplasma pneumoniae NOT DETECTED NOT DETECTED  Procalcitonin     Status: None   Collection Time: 09/03/2023  6:41 PM  Result Value Ref Range   Procalcitonin <0.10 ng/mL  Ammonia     Status: Abnormal   Collection Time: 09/04/2023  7:43 PM  Result Value Ref Range   Ammonia 38 (H) 9 - 35 umol/L  Troponin I (High Sensitivity)     Status: Abnormal   Collection Time: 09/07/2023  7:43 PM  Result Value Ref Range   Troponin I (High Sensitivity) 50 (H) <18 ng/L  CBG monitoring, ED     Status: None   Collection Time:  pH, Arterial 7.331 (L) 7.35 - 7.45   pCO2 arterial 32.8 32 - 48 mmHg   pO2, Arterial 138 (H) 83 - 108 mmHg   Bicarbonate 17.3 (L) 20.0 - 28.0 mmol/L   TCO2 18 (L) 22 - 32 mmol/L   O2 Saturation 99 %   Acid-base deficit 8.0 (H) 0.0 - 2.0 mmol/L   Sodium 142 135 - 145 mmol/L   Potassium 3.6 3.5 - 5.1 mmol/L   Calcium, Ion 1.21 1.15 - 1.40 mmol/L   HCT 39.0 39.0 - 52.0 %   Hemoglobin 13.3 13.0 - 17.0 g/dL   Collection site RADIAL, [facility]'S TEST ACCEPTABLE    Drawn by RT    Sample type ARTERIAL   Brain natriuretic peptide     Status: Abnormal   Collection Time: 09/23/2023  6:41 PM  Result Value Ref Range   B Natriuretic Peptide 2,523.7 (H) 0.0 - 100.0 pg/mL  SARS Coronavirus 2 by RT PCR (hospital order, performed in [facility] hospital lab) *cepheid single result test* Anterior Nasal Swab     Status: None   Collection Time: 09/07/2023  6:41 PM   Specimen: Anterior Nasal Swab  Result Value Ref Range   SARS Coronavirus 2 by RT PCR NEGATIVE NEGATIVE  Protime-INR     Status: Abnormal   Collection Time: 09/20/2023  6:41 PM  Result Value Ref Range   Prothrombin Time 17.4 (H) 11.4 - 15.2  seconds   INR 1.4 (H) 0.8 - 1.2  APTT     Status: None   Collection Time: 09/08/2023  6:41 PM  Result Value Ref Range   aPTT 27 24 - 36 seconds  Troponin I (High Sensitivity)     Status: Abnormal   Collection Time: 09/08/2023  6:41 PM  Result Value Ref Range   Troponin I (High Sensitivity) 47 (H) <18 ng/L  Respiratory (~20 pathogens) panel by PCR     Status: None   Collection Time: 09/16/2023  6:41 PM   Specimen: Nasopharyngeal Swab; Respiratory  Result Value Ref Range   Adenovirus NOT DETECTED NOT DETECTED   Coronavirus 229E NOT DETECTED NOT DETECTED   Coronavirus HKU1 NOT DETECTED NOT DETECTED   Coronavirus NL63 NOT DETECTED NOT DETECTED   Coronavirus OC43 NOT DETECTED NOT DETECTED   Metapneumovirus NOT DETECTED NOT DETECTED   Rhinovirus / Enterovirus NOT DETECTED NOT DETECTED   Influenza A NOT DETECTED NOT DETECTED   Influenza B NOT DETECTED NOT DETECTED   Parainfluenza Virus 1 NOT DETECTED NOT DETECTED   Parainfluenza Virus 2 NOT DETECTED NOT DETECTED   Parainfluenza Virus 3 NOT DETECTED NOT DETECTED   Parainfluenza Virus 4 NOT DETECTED NOT DETECTED   Respiratory Syncytial Virus NOT DETECTED NOT DETECTED   Bordetella pertussis NOT DETECTED NOT DETECTED   Bordetella Parapertussis NOT DETECTED NOT DETECTED   Chlamydophila pneumoniae NOT DETECTED NOT DETECTED   Mycoplasma pneumoniae NOT DETECTED NOT DETECTED  Procalcitonin     Status: None   Collection Time: 09/03/2023  6:41 PM  Result Value Ref Range   Procalcitonin <0.10 ng/mL  Ammonia     Status: Abnormal   Collection Time: 09/04/2023  7:43 PM  Result Value Ref Range   Ammonia 38 (H) 9 - 35 umol/L  Troponin I (High Sensitivity)     Status: Abnormal   Collection Time: 09/07/2023  7:43 PM  Result Value Ref Range   Troponin I (High Sensitivity) 50 (H) <18 ng/L  CBG monitoring, ED     Status: None   Collection Time:  pH, Arterial 7.331 (L) 7.35 - 7.45   pCO2 arterial 32.8 32 - 48 mmHg   pO2, Arterial 138 (H) 83 - 108 mmHg   Bicarbonate 17.3 (L) 20.0 - 28.0 mmol/L   TCO2 18 (L) 22 - 32 mmol/L   O2 Saturation 99 %   Acid-base deficit 8.0 (H) 0.0 - 2.0 mmol/L   Sodium 142 135 - 145 mmol/L   Potassium 3.6 3.5 - 5.1 mmol/L   Calcium, Ion 1.21 1.15 - 1.40 mmol/L   HCT 39.0 39.0 - 52.0 %   Hemoglobin 13.3 13.0 - 17.0 g/dL   Collection site RADIAL, ALLEN'S TEST ACCEPTABLE    Drawn by RT    Sample type ARTERIAL   Brain natriuretic peptide     Status: Abnormal   Collection Time: 09/23/2023  6:41 PM  Result Value Ref Range   B Natriuretic Peptide 2,523.7 (H) 0.0 - 100.0 pg/mL  SARS Coronavirus 2 by RT PCR (hospital order, performed in [facility] hospital lab) *cepheid single result test* Anterior Nasal Swab     Status: None   Collection Time: 09/07/2023  6:41 PM   Specimen: Anterior Nasal Swab  Result Value Ref Range   SARS Coronavirus 2 by RT PCR NEGATIVE NEGATIVE  Protime-INR     Status: Abnormal   Collection Time: 09/20/2023  6:41 PM  Result Value Ref Range   Prothrombin Time 17.4 (H) 11.4 - 15.2  seconds   INR 1.4 (H) 0.8 - 1.2  APTT     Status: None   Collection Time: 09/08/2023  6:41 PM  Result Value Ref Range   aPTT 27 24 - 36 seconds  Troponin I (High Sensitivity)     Status: Abnormal   Collection Time: 09/08/2023  6:41 PM  Result Value Ref Range   Troponin I (High Sensitivity) 47 (H) <18 ng/L  Respiratory (~20 pathogens) panel by PCR     Status: None   Collection Time: 09/16/2023  6:41 PM   Specimen: Nasopharyngeal Swab; Respiratory  Result Value Ref Range   Adenovirus NOT DETECTED NOT DETECTED   Coronavirus 229E NOT DETECTED NOT DETECTED   Coronavirus HKU1 NOT DETECTED NOT DETECTED   Coronavirus NL63 NOT DETECTED NOT DETECTED   Coronavirus OC43 NOT DETECTED NOT DETECTED   Metapneumovirus NOT DETECTED NOT DETECTED   Rhinovirus / Enterovirus NOT DETECTED NOT DETECTED   Influenza A NOT DETECTED NOT DETECTED   Influenza B NOT DETECTED NOT DETECTED   Parainfluenza Virus 1 NOT DETECTED NOT DETECTED   Parainfluenza Virus 2 NOT DETECTED NOT DETECTED   Parainfluenza Virus 3 NOT DETECTED NOT DETECTED   Parainfluenza Virus 4 NOT DETECTED NOT DETECTED   Respiratory Syncytial Virus NOT DETECTED NOT DETECTED   Bordetella pertussis NOT DETECTED NOT DETECTED   Bordetella Parapertussis NOT DETECTED NOT DETECTED   Chlamydophila pneumoniae NOT DETECTED NOT DETECTED   Mycoplasma pneumoniae NOT DETECTED NOT DETECTED  Procalcitonin     Status: None   Collection Time: 09/03/2023  6:41 PM  Result Value Ref Range   Procalcitonin <0.10 ng/mL  Ammonia     Status: Abnormal   Collection Time: 09/04/2023  7:43 PM  Result Value Ref Range   Ammonia 38 (H) 9 - 35 umol/L  Troponin I (High Sensitivity)     Status: Abnormal   Collection Time: 09/07/2023  7:43 PM  Result Value Ref Range   Troponin I (High Sensitivity) 50 (H) <18 ng/L  CBG monitoring, ED     Status: None   Collection Time:  tube placement. EXAM: PORTABLE CHEST 1 VIEW COMPARISON:  Chest x-ray 04/19/2023 and older FINDINGS: ET tube in place with the tip 3.3 cm above the carina. Enteric tube with tip extending beneath the diaphragm. Enlarged heart with some vascular congestion.  Interstitial prominence as well. Left midlung scar or atelectasis. No pneumothorax, effusion. Overlapping cardiac leads. No consolidation. IMPRESSION: Enlarged cardiopericardial silhouette with some vascular congestion. Interstitial prominence. Question trace edema ET tube and enteric tube Electronically Signed   By: Karen Kays M.D.   On: 09/16/2023 17:47   DG Abdomen 1 View  Result Date: 08/31/2023 CLINICAL DATA:  OG tube placement EXAM: ABDOMEN - 1 VIEW limited for tube placement COMPARISON:  None Available. FINDINGS: Limited x-ray of the upper abdomen has a enteric tube with tip overlying the upper stomach. Gas seen in nondilated loops of bowel elsewhere in the visualized abdomen. Overlapping cardiac leads. IMPRESSION: Limited x-ray has a enteric tube with tip overlying the upper stomach. Electronically Signed   By: Karen Kays M.D.   On: 09/14/2023 17:46    I independently reviewed the above imaging studies.  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 if remains intubated sedated can maintain, would need to be changed out monthly, which could be attempted by nursing staff --please call with any questions  Irine Seal MD 09/02/2023, 8:53 AM  [facility]  Pager: [phone]

## 2023-09-27 NOTE — Procedures (Signed)
Patient intubated/sedated at time of procedure. Patient prepped/draped in the usual fashion. I attempted to gently place a 16 fr coude but met resistance, likely at bladder neck. I then inserted the cystoscope. Anterior urethra was unremarkable, and there was a ~12 fr bladder neck contracture that was not passable with the scope. I inserted a wire into the bladder. The cystoscope was withdrawn. I then used Goodwin sounds to dilate the bladder neck from 12-22 Fr. Thereafter I was able to place a 16 fr council tip catheter over the wire. The foley was connected to the appropriate collection bag.   See consult note for further recs

## 2023-09-27 NOTE — Plan of Care (Signed)

## 2023-09-27 NOTE — Death Summary Note (Signed)
DEATH SUMMARY   Patient Details  Name: Jordan [name]. MRN: [id number] DOB: 11/05/1946  Admission/Discharge Information   Admit Date:  09/29/2023  Date of Death: Date of Death: 09-30-23  Time of Death: Time of Death: 1444  Length of Stay: 1  Referring Physician: Lorenda Ishihara, MD   Reason(s) for Hospitalization  Altered mental status  Diagnoses  Preliminary cause of death:  Cardiac arrest Cardiac amyloidosis Cardiomyopathy Atrial fibrillation on Eliquis Acute on chronic kidney disease stage IIIb Acute metabolic encephalopathy DNR  Secondary Diagnoses (including complications and co-morbidities):  Principal Problem:   Acute metabolic encephalopathy CKD IIIb Cardiac amyloid Afib  Chronic AC DVT  HTN  NICM  Brief Hospital Course (including significant findings, care, treatment, and services provided and events leading to death)  77 yo M PMH afib on eliquis, CKD 3b, Dvt, prostate ca, 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 Initially thought to have been found as welfare check after 2-3d without contact to others, but sounds like on further interview, he was on phone w family member 09/29/2023 and was confused, got SOB, call was dc. There is question of sz activity prior to arrival. In ED GCS 6, intubated in this setting. Neuro consulted. CT non acute. Low suspicion for CNS infection. MRI ordered   He was admitted to the ICU on the ventilator and had poor mental status.  Neurology was following with EEG showing no seizures.  On 09/12/2023 he went into rapid atrial fibrillation with a heart rate in the 200s.  He is given 5 mg of Lopressor which he tolerated but then in half an hour he went into PEA cardiac arrest and subsequently suffered multiple cardiac arrests.  Family is at the bedside and made him a DNR.  He again went into cardiac arrest later that afternoon passed away  Signature:   Chilton Greathouse MD [place]  [facility] 09/16/2023, 10:42 AM

## 2023-09-27 NOTE — Progress Notes (Signed)
Chaplain responded to code page. Chaplain established relationship and built rapport with Jordan [name]'s son. Son shared his love for his dad and some of Jordan [name]'s healthcare journey. Jordan [name] has had some significant healthcare challenges, and son expressed that he heavily grieved for his dad when he had a stroke. Chaplain could assess that son has an understanding around his father's mortality. Son also noted that their family has suffered a number of familial losses.   Chaplain was also able to meet Jordan [name]'s cousin while they waited for Jordan [name]'s sister to arrive. Son and aunt have been integral caregivers in Jordan [name] life and will make decisions together. Chaplain offered compassionate and supportive presence, check-ins throughout code, and let them know of Chaplain support throughout the day and night.     09/18/2023 1200  Spiritual Encounters  Type of Visit Initial  Care provided to: Family  Referral source Code page  Interventions  Spiritual Care Interventions Made Established relationship of care and support;Reflective listening;Narrative/life review  Intervention Outcomes  Outcomes Connection to spiritual care

## 2023-09-27 NOTE — IPAL (Addendum)
Interdisciplinary Goals of Care Family Meeting   Date carried out:: 09/20/2023  Location of Jordan meeting: Conference room  Member's involved: Physician and Family Member or next of kin  Durable Power of Attorney or acting medical decision maker: Son, sister  Discussion: We discussed goals of care for Jordan [name]. .  Patient had multiple cardiac arrests today and was resuscitated up to 4 times.  He is now maxed out on epinephrine and Levophed drip.  pH 7.03 and is started on bicarbonate drip.  Discussed with family in conference room and advised that we should not do CPR again and wake him a DNR.  They agreed to this.  CODE STATUS changed  Code status: Limited Code or DNR with short term, Mechanical ventilation, Cental IV access, and IV vasoactive drugs  Disposition: Continue current acute care   Time spent for Jordan meeting: 30 mins  Jordan [name] 09/06/2023, 1:58 PM

## 2023-09-27 NOTE — Procedures (Signed)
Central Venous Catheter Insertion Procedure Note  Jordan [name]  [id number]  27-Feb-1946  Date:09/06/2023  Time:2:48 PM   Provider Performing:Bonni Neuser D. Harris   Procedure: Insertion of Non-tunneled Central Venous Catheter(36556) with US guidance (29528)   Indication(s) Medication administration  Consent Unable to obtain consent due to emergent nature of procedure.  Anesthesia Topical only with 1% lidocaine   Timeout Verified patient identification, verified procedure, site/side was marked, verified correct patient position, special equipment/implants available, medications/allergies/relevant history reviewed, required imaging and test results available.  Sterile Technique Maximal sterile technique including full sterile barrier drape, hand hygiene, sterile gown, sterile gloves, mask, hair covering, sterile ultrasound probe cover (if used).  Procedure Description Area of catheter insertion was cleaned with chlorhexidine and draped in sterile fashion.  With real-time ultrasound guidance a central venous catheter was placed into the right femoral vein. Nonpulsatile blood flow and easy flushing noted in all ports.  The catheter was sutured in place and sterile dressing applied.  Complications/Tolerance None; patient tolerated the procedure well. Chest X-ray is ordered to verify placement for internal jugular or subclavian cannulation.   Chest x-ray is not ordered for femoral cannulation.  EBL Minimal  Specimen(s) None  Jordan [name] D. Harris, NP-C [facility] Personal contact information can be found on Amion  If no contact or response made please call [phone] 08/30/2023, 2:49 PM

## 2023-09-27 NOTE — Progress Notes (Signed)
Neurology Progress Note   S:// No acute events overnight Low amplitude EEG with no variability with stimulation. Obstructive uropathy requiring coud catheterization  O:// Current vital signs: BP 125/83   Pulse 81   Temp 98.6 F (37 C)   Resp (!) 0   Wt 90.9 kg   SpO2 100%   BMI 29.17 kg/m  Vital signs in last 24 hours: Temp:  [94.5 F (34.7 C)-98.6 F (37 C)] 98.6 F (37 C) 07-Oct-2023 0700) Pulse Rate:  [50-123] 81 Oct 07, 2023 0700) Resp:  [0-34] 0 07-Oct-2023 0700) BP: (92-166)/(75-150) 125/83 10/07/2023 0700) SpO2:  [92 %-100 %] 100 % 10/07/23 0700) FiO2 (%):  [70 %-100 %] 70 % October 07, 2023 0415) Weight:  [86.2 kg-90.9 kg] 90.9 kg 10/07/2023 0414) General: Sedated intubated sedation held for exam HEENT: Normocephalic atraumatic Lungs: Vented Neurological exam Sedated intubated Sedation with propofol held for exam No spontaneous movement No movement to noxious simulation Cranial nerves: Pupils equal very sluggishly reactive to light, gaze mildly disconjugate, does not blink to threat from either side, facial symmetry difficult to ascertain due to the endotracheal tube Motor examination with no spontaneous movements or movement to noxious stimulation Sensory exam as above Coordination cannot be assessed  Medications  Current Facility-Administered Medications:    acetaminophen (TYLENOL) tablet 650 mg, 650 mg, Oral, Q4H PRN, Bowser, Grace E, NP   Ampicillin-Sulbactam (UNASYN) 3 g in sodium chloride 0.9 % 100 mL IVPB, 3 g, Intravenous, Q8H, Daylene Posey, RPH, Stopped at Oct 07, 2023 0535   Chlorhexidine Gluconate Cloth 2 % PADS 6 each, 6 each, Topical, Q2000, Lorin Glass, MD, 6 each at 09/13/2023 2132   docusate (COLACE) 50 MG/5ML liquid 100 mg, 100 mg, Per Tube, BID, Bowser, Kaylyn Layer, NP   docusate (COLACE) 50 MG/5ML liquid 100 mg, 100 mg, Per Tube, BID PRN, Lorin Glass, MD   fentaNYL (SUBLIMAZE) injection 25 mcg, 25 mcg, Intravenous, Q15 min PRN, Bowser, Kaylyn Layer, NP   fentaNYL (SUBLIMAZE)  injection 25-100 mcg, 25-100 mcg, Intravenous, Q30 min PRN, Bowser, Grace E, NP, 100 mcg at 2023-10-07 0648   heparin ADULT infusion 100 units/mL (25000 units/232mL), 1,200 Units/hr, Intravenous, Continuous, Daylene Posey, RPH, Last Rate: 12 mL/hr at 10/07/2023 0700, 1,200 Units/hr at 2023-10-07 0700   insulin aspart (novoLOG) injection 0-9 Units, 0-9 Units, Subcutaneous, Q4H, Bowser, Grace E, NP   ipratropium-albuterol (DUONEB) 0.5-2.5 (3) MG/3ML nebulizer solution 3 mL, 3 mL, Nebulization, Q4H PRN, Bowser, Kaylyn Layer, NP   lactated ringers infusion, , Intravenous, Continuous, Bowser, Kaylyn Layer, NP   lactated ringers infusion, , Intravenous, Continuous, Lorin Glass, MD, Last Rate: 75 mL/hr at 2023/10/07 0700, Infusion Verify at 2023-10-07 0700   lidocaine (XYLOCAINE) 2 % jelly 1 Application, 1 Application, Urethral, Once, Ogan, Okoronkwo U, MD   ondansetron (ZOFRAN) injection 4 mg, 4 mg, Intravenous, Q6H PRN, Bowser, Kaylyn Layer, NP   Oral care mouth rinse, 15 mL, Mouth Rinse, Q2H, Lorin Glass, MD, 15 mL at 2023-10-07 0604   Oral care mouth rinse, 15 mL, Mouth Rinse, PRN, Lorin Glass, MD   pantoprazole (PROTONIX) injection 40 mg, 40 mg, Intravenous, QHS, Bowser, Grace E, NP, 40 mg at 09/05/2023 2211   polyethylene glycol (MIRALAX / GLYCOLAX) packet 17 g, 17 g, Oral, Daily PRN, Bowser, Kaylyn Layer, NP   polyethylene glycol (MIRALAX / GLYCOLAX) packet 17 g, 17 g, Per Tube, Daily, Bowser, Grace E, NP   propofol (DIPRIVAN) 1000 MG/100ML infusion, 0-50 mcg/kg/min, Intravenous, Continuous, Bowser, Grace E, NP, Last Rate: 18.1 mL/hr  at 09/13/2023 0700, 35 mcg/kg/min at 09-13-23 0700  Labs CBC    Component Value Date/Time   WBC 5.1 09/18/2023 1604   RBC 5.30 09/14/2023 1604   HGB 13.3 09/24/2023 1724   HGB 15.0 06/24/2022 1552   HCT 39.0 09/19/2023 1724   HCT 46.8 06/24/2022 1552   PLT 145 (L) 08/30/2023 1604   PLT 206 06/24/2022 1552   MCV 90.8 09/13/2023 1604   MCV 89 06/24/2022 1552   MCH 27.7 09/11/2023  1604   MCHC 30.6 09/04/2023 1604   RDW 19.9 (H) 09/03/2023 1604   RDW 14.0 06/24/2022 1552   LYMPHSABS 0.9 09/23/2023 1604   MONOABS 0.7 09/03/2023 1604   EOSABS 0.1 09/13/2023 1604   BASOSABS 0.0 08/29/2023 1604    CMP     Component Value Date/Time   NA 142 08/30/2023 1724   NA 141 08/19/2023 1429   K 3.6 09/14/2023 1724   CL 111 09/16/2023 1612   CO2 18 (L) 09/19/2023 1604   GLUCOSE 87 09/19/2023 1612   BUN 35 (H) 09/23/2023 1612   BUN 34 (H) 08/19/2023 1429   CREATININE 1.70 (H) 09/04/2023 1612   CREATININE 1.73 (H) 11/10/2021 1506   CALCIUM 8.9 09/15/2023 1604   PROT 7.7 09/12/2023 1604   PROT 6.9 05/11/2022 1527   ALBUMIN 2.9 (L) 09/14/2023 1604   ALBUMIN 4.0 05/03/2018 1017   AST 34 09/14/2023 1604   ALT 23 08/29/2023 1604   ALT 57 (H) 08/04/2021 1553   ALKPHOS 112 09/19/2023 1604   BILITOT 2.6 (H) 09/02/2023 1604   BILITOT 1.2 05/03/2018 1017   GFRNONAA 38 (L) 09/07/2023 1604   GFRAA >60 11/26/2018 1850    Lipid Panel     Component Value Date/Time   CHOL 150 04/28/2021 0446   CHOL 157 05/03/2018 1017   TRIG 60 Sep 13, 2023 0605   HDL 37 (L) 04/28/2021 0446   HDL 55 05/03/2018 1017   CHOLHDL 4.1 04/28/2021 0446   VLDL 10 04/28/2021 0446   LDLCALC 103 (H) 04/28/2021 0446   LDLCALC 88 05/03/2018 1017    Lab Results  Component Value Date   HGBA1C 5.6 04/28/2021  Urinalysis with no evidence of infection  Imaging I have reviewed images in epic and the results pertinent to this consultation are: None new brain imaging to review Chest imaging-CT chest with heterogeneous posterior consolidation and groundglass density in lower lobes with lower lobe bronchial wall thickening suspicious for pneumonia or aspiration.  Small left pleural effusion.  Enlarged right paratracheal node potentially reactive.  Cardiomegaly.  Cholelithiasis.  Suspicion of nodular liver contour as may be seen with cirrhosis.  Prostatectomy.  Aortic atherosclerosis.  Overnight EEG: No  seizures.  Diffuse severe encephalopathy.  Assessment: 77 year old past history of atrial fibrillation on Eliquis, history of DVT prostate cancer found down after welfare check with last known well 2 to 3 days prior to presentation.  Unable to protect airway and emergently intubated.  Exam remains extremely poor and EEG remains extremely low amplitude consistent with severe encephalopathy. No bleeding seen on CT head obtained on arrival. Chest CT with multifocal pneumonia.  Most likely the cause of his severe encephalopathy but given history of atrial fibrillation, acute ischemic stroke versus seizure versus toxic metabolic encephalopathy remains in the differentials. He is afebrile, no leukocytosis no neck stiffness to suggest CNS infection.  Recommendations: Discontinue sedation and use minimal amounts of sedation as needed for patient comfort Frequent exams off sedation Continue LTM EEG MRI of the brain after EEG  leads are removed Neurology will continue to follow Plan discussed with Dr. Isaiah Serge   -- Milon Dikes, MD Neurologist [facility] Pager: [phone]  CRITICAL CARE ATTESTATION Performed by: Milon Dikes, MD Total critical care time: 30 minutes Critical care time was exclusive of separately billable procedures and treating other patients and/or supervising APPs/Residents/Students Critical care was necessary to treat or prevent imminent or life-threatening deterioration. This patient is critically ill and at significant risk for neurological worsening and/or death and care requires constant monitoring. Critical care was time spent personally by me on the following activities: development of treatment plan with patient and/or surrogate as well as nursing, discussions with consultants, evaluation of patient's response to treatment, examination of patient, obtaining history from patient or surrogate, ordering and performing treatments and interventions, ordering and  review of laboratory studies, ordering and review of radiographic studies, pulse oximetry, re-evaluation of patient's condition, participation in multidisciplinary rounds and medical decision making of high complexity in the care of this patient.

## 2023-09-27 NOTE — Progress Notes (Signed)
At approximately 1228 I was in patient's room when I observed patient's eyelids open, pupils went from a size 2 to a size 4 and HR decreased to 40's. Dr. Isaiah Serge was paged and one amp of atropine administered however no pulse was palpable. Code Blue was initiated with ROSC (see code Blue sheet). Patient then lost pulse two more times. Patient's family was at bedside at start of event and available during events.

## 2023-09-27 DEATH — deceased

## 2023-10-24 MED FILL — Medication: Qty: 1 | Status: AC

## 2023-11-29 ENCOUNTER — Ambulatory Visit: Payer: Medicare Other | Admitting: Cardiovascular Disease
